# Patient Record
Sex: Male | Born: 1965 | Race: White | Hispanic: No | Marital: Married | Smoking: Never smoker
Health system: Southern US, Community
[De-identification: ages and names within clinical notes are randomized; demographics above are authoritative.]

## PROBLEM LIST (undated history)

## (undated) DIAGNOSIS — I509 Heart failure, unspecified: Secondary | ICD-10-CM

## (undated) DIAGNOSIS — D689 Coagulation defect, unspecified: Secondary | ICD-10-CM

## (undated) DIAGNOSIS — I10 Essential (primary) hypertension: Secondary | ICD-10-CM

## (undated) DIAGNOSIS — M109 Gout, unspecified: Secondary | ICD-10-CM

## (undated) DIAGNOSIS — N189 Chronic kidney disease, unspecified: Secondary | ICD-10-CM

## (undated) DIAGNOSIS — Z87442 Personal history of urinary calculi: Secondary | ICD-10-CM

## (undated) DIAGNOSIS — M1990 Unspecified osteoarthritis, unspecified site: Secondary | ICD-10-CM

## (undated) HISTORY — DX: Heart failure, unspecified: I50.9

## (undated) HISTORY — DX: Essential (primary) hypertension: I10

## (undated) HISTORY — PX: OTHER SURGICAL HISTORY: SHX169

## (undated) HISTORY — DX: Chronic kidney disease, unspecified: N18.9

## (undated) HISTORY — DX: Coagulation defect, unspecified: D68.9

## (undated) HISTORY — DX: Unspecified osteoarthritis, unspecified site: M19.90

---

## 2007-09-01 ENCOUNTER — Ambulatory Visit: Payer: Self-pay | Admitting: Family Medicine

## 2007-09-01 ENCOUNTER — Observation Stay (HOSPITAL_COMMUNITY): Admission: EM | Admit: 2007-09-01 | Discharge: 2007-09-03 | Payer: Self-pay | Admitting: Emergency Medicine

## 2009-10-17 ENCOUNTER — Encounter: Admission: RE | Admit: 2009-10-17 | Discharge: 2009-10-17 | Payer: Self-pay | Admitting: Internal Medicine

## 2010-05-23 ENCOUNTER — Ambulatory Visit (HOSPITAL_COMMUNITY)
Admission: RE | Admit: 2010-05-23 | Discharge: 2010-05-23 | Payer: Self-pay | Source: Home / Self Care | Attending: Orthopedic Surgery | Admitting: Orthopedic Surgery

## 2010-05-23 LAB — DIFFERENTIAL
Basophils Absolute: 0 10*3/uL (ref 0.0–0.1)
Basophils Relative: 0 % (ref 0–1)
Eosinophils Absolute: 0.1 10*3/uL (ref 0.0–0.7)
Eosinophils Relative: 1 % (ref 0–5)
Lymphocytes Relative: 35 % (ref 12–46)
Lymphs Abs: 2.8 10*3/uL (ref 0.7–4.0)
Monocytes Absolute: 0.6 10*3/uL (ref 0.1–1.0)
Monocytes Relative: 7 % (ref 3–12)
Neutro Abs: 4.4 10*3/uL (ref 1.7–7.7)
Neutrophils Relative %: 56 % (ref 43–77)

## 2010-05-23 LAB — CBC
HCT: 40.7 % (ref 39.0–52.0)
Hemoglobin: 13.2 g/dL (ref 13.0–17.0)
MCH: 28.9 pg (ref 26.0–34.0)
MCHC: 32.4 g/dL (ref 30.0–36.0)
MCV: 89.3 fL (ref 78.0–100.0)
Platelets: 202 10*3/uL (ref 150–400)
RBC: 4.56 MIL/uL (ref 4.22–5.81)
RDW: 13.4 % (ref 11.5–15.5)
WBC: 7.9 10*3/uL (ref 4.0–10.5)

## 2010-05-23 LAB — URINALYSIS, ROUTINE W REFLEX MICROSCOPIC
Bilirubin Urine: NEGATIVE
Hemoglobin, Urine: NEGATIVE
Ketones, ur: NEGATIVE mg/dL
Nitrite: NEGATIVE
Protein, ur: NEGATIVE mg/dL
Specific Gravity, Urine: 1.023 (ref 1.005–1.030)
Urine Glucose, Fasting: NEGATIVE mg/dL
Urobilinogen, UA: 0.2 mg/dL (ref 0.0–1.0)
pH: 5.5 (ref 5.0–8.0)

## 2010-05-23 LAB — BASIC METABOLIC PANEL
BUN: 16 mg/dL (ref 6–23)
CO2: 25 mEq/L (ref 19–32)
Calcium: 9.5 mg/dL (ref 8.4–10.5)
Chloride: 105 mEq/L (ref 96–112)
Creatinine, Ser: 0.9 mg/dL (ref 0.4–1.5)
GFR calc Af Amer: >60 mL/min (ref >60)
GFR calc non Af Amer: >60 mL/min (ref >60)
Glucose, Bld: 170 mg/dL — ABNORMAL HIGH (ref 70–99)
Potassium: 4.5 mEq/L (ref 3.5–5.1)
Sodium: 139 mEq/L (ref 135–145)

## 2010-05-23 LAB — PROTIME-INR
INR: 0.99 (ref 0.00–1.49)
Prothrombin Time: 13.3 seconds (ref 11.6–15.2)

## 2010-05-23 LAB — APTT: aPTT: 27 seconds (ref 24–37)

## 2010-06-09 ENCOUNTER — Encounter: Payer: Self-pay | Admitting: Internal Medicine

## 2010-06-10 ENCOUNTER — Inpatient Hospital Stay (HOSPITAL_COMMUNITY)
Admission: RE | Admit: 2010-06-10 | Discharge: 2010-06-12 | Payer: Self-pay | Source: Home / Self Care | Attending: Orthopedic Surgery | Admitting: Orthopedic Surgery

## 2010-06-11 LAB — GLUCOSE, CAPILLARY
Glucose-Capillary: 231 mg/dL — ABNORMAL HIGH (ref 70–99)
Glucose-Capillary: 208 mg/dL — ABNORMAL HIGH (ref 70–99)
Glucose-Capillary: 197 mg/dL — ABNORMAL HIGH (ref 70–99)
Glucose-Capillary: 221 mg/dL — ABNORMAL HIGH (ref 70–99)

## 2010-06-11 LAB — BASIC METABOLIC PANEL
BUN: 20 mg/dL (ref 6–23)
CO2: 24 mEq/L (ref 19–32)
Calcium: 9.4 mg/dL (ref 8.4–10.5)
Chloride: 103 mEq/L (ref 96–112)
Creatinine, Ser: 1.12 mg/dL (ref 0.4–1.5)
GFR calc Af Amer: >60 mL/min (ref >60)
GFR calc non Af Amer: >60 mL/min (ref >60)
Glucose, Bld: 232 mg/dL — ABNORMAL HIGH (ref 70–99)
Potassium: 4.5 mEq/L (ref 3.5–5.1)
Sodium: 139 mEq/L (ref 135–145)

## 2010-06-11 LAB — ABO/RH: ABO/RH(D): A POS

## 2010-06-11 LAB — TYPE AND SCREEN
ABO/RH(D): A POS
Antibody Screen: NEGATIVE

## 2010-06-12 LAB — URINALYSIS, ROUTINE W REFLEX MICROSCOPIC
Nitrite: NEGATIVE
Protein, ur: 30 mg/dL — AB

## 2010-06-12 LAB — GLUCOSE, CAPILLARY
Glucose-Capillary: 212 mg/dL — ABNORMAL HIGH (ref 70–99)
Glucose-Capillary: 255 mg/dL — ABNORMAL HIGH (ref 70–99)
Glucose-Capillary: 256 mg/dL — ABNORMAL HIGH (ref 70–99)
Glucose-Capillary: 233 mg/dL — ABNORMAL HIGH (ref 70–99)

## 2010-06-12 LAB — BASIC METABOLIC PANEL
BUN: 28 mg/dL — ABNORMAL HIGH (ref 6–23)
CO2: 26 mEq/L (ref 19–32)
Calcium: 8.6 mg/dL (ref 8.4–10.5)
Chloride: 100 mEq/L (ref 96–112)
Creatinine, Ser: 0.94 mg/dL (ref 0.4–1.5)
GFR calc Af Amer: >60 mL/min (ref >60)
Glucose, Bld: 195 mg/dL — ABNORMAL HIGH (ref 70–99)
Glucose, Bld: 209 mg/dL — ABNORMAL HIGH (ref 70–99)
Potassium: 4.4 mEq/L (ref 3.5–5.1)
Sodium: 136 mEq/L (ref 135–145)

## 2010-06-12 LAB — CBC
HCT: 31.3 % — ABNORMAL LOW (ref 39.0–52.0)
Hemoglobin: 11 g/dL — ABNORMAL LOW (ref 13.0–17.0)
MCH: 28.9 pg (ref 26.0–34.0)
MCHC: 33 g/dL (ref 30.0–36.0)
MCV: 88.4 fL (ref 78.0–100.0)
Platelets: 173 10*3/uL (ref 150–400)
RBC: 3.54 MIL/uL — ABNORMAL LOW (ref 4.22–5.81)
WBC: 12.3 10*3/uL — ABNORMAL HIGH (ref 4.0–10.5)

## 2010-06-13 LAB — SURGICAL PCR SCREEN: MRSA, PCR: NEGATIVE

## 2010-06-17 NOTE — Op Note (Signed)
NAMEAULTON, ROUTT NO.:  [id number]  MEDICAL RECORD NO.:  [id number]          PATIENT TYPE:  INP  LOCATION:  0010                         FACILITY:  [facility]  PHYSICIAN:  Madlyn Frankel. Charlann Boxer, M.D.  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  PROCEDURE PERFORMED:  Right total hip replacement utilizing DePuy component size 54 Pinnacle cup, single cancellous screw, 36 +4 neutral AltrX liner, size 5 high Tri-Lock stem, with 36 +1.5 Delta ceramic ball.  SURGEON:  Madlyn Frankel. Charlann Boxer, MD  ASSISTANT:  Nelia Shi. Webb Silversmith, Nurse Practitioner  ANESTHESIA:  General endotracheal.  SPECIMENS:  None.  DRAINS:  One Hemovac.  BLOOD LOSS:  Approximately 600 mL.  INDICATIONS FOR PROCEDURE:  Mr. Tavis is a 45 year old male who presented to the office for evaluation of right hip pain with significant loss of motion and function and limp related to his advanced right hip degenerative changes noted radiographically.  He had proximal femoral deformity from history of old trauma versus slipped capital femoral epiphysis, history unclear.  After reviewing the risks of infection, DVT, component failure, dislocation, the postop course expectations, his increased risk related to his diabetes, as well as a discussion about receiving tranexamic acid, the patient was consented for benefit of pain relief.  PROCEDURE IN DETAIL:  The patient was brought to the operative theater. Once adequate anesthesia, preoperative antibiotics, Ancef administered  2 g, he was positioned to the left lateral decubitus position with the right side up.  The right lower extremity was then prepped and draped in a sterile fashion.  A time-out was performed identifying the patient, planned procedure and extremity.  The patient was noted to have a lot of small little wounds along his buttock in the cheek area.  This was draped out with [place].  Incision was identified and marked out to avoid these areas.  Time-out was performed identifying the patient, planned procedure and extremity.  A lateral based incision was made based off his proximal trochanter which had migrated up.  Sharp dissection was carried to the iliotibial band and gluteal fascia.  These were incised posteriorly. The patient was noted to have a 10-15 degree external rotation contracture, so soft tissue dissection posteriorly was carried out carefully.  Once I had released some of the capsule into the neck area, I was able to rotate the hip enough to get it dislocated.  Once I had debrided and cleared out the proximal femur area around the neck-shaft junction, I was able to make an osteotomy in the trochanteric fossa.  Attention was first directed to the femur.  Femoral hook was prepared by opening the proximal femur with a drill, hand reamed once and then irrigated to try to prevent fat emboli.  I began broaching with size  1 broach and broached up initially to a size 4 broach.  I  re-angled my neck cut a bit to better fit the prosthesis.  The femur was then packed with a sponge, and attention was now directed to the acetabulum.  Acetabular retractors were placed and significant deformity to the acetabulum was then noted and appreciated.  Once the acetabulum was exposed, I began reaming with a 48 reamer, reamed up to 53 reamer with good bony preparation.  A 54 pinnacle cup was then impacted with a curved impactor using the cup guide.  It appeared to be at 35-40 degrees of abduction and  was forward flexed enough to bring it to the anterior rim anteriorly with the portion of cup exposed superolaterally.  At this point, a single cancellous screw was placed.  The 36 +4 neutral AltrX liner was then impacted into position.  Attention was now directed to trial reduction.  The 4 broach was impacted.  The trial reduction was carried out.  The broach sat at the level of the neck cut at this point.  The trial indicated a stable hip without evidence of impingement or subluxation throughout the range of motion, far more than he had preoperatively.  At this point, the hip was dislocated.  When I checked for torsion on the 4 broach, there was a little bit of movement, so I broached up to a size 5 and then chose a 5 high Tri-Lock stem.  The final 5 high Tri-Lock stem was impacted.  It sat about a millimeter proud of the neck cut which was acceptable.  I re-trialled and chose a 36 +1.5 Delta ceramic ball.  It was then impacted onto a clean and dry trunnion and the hip reduced.  We did irrigate the hip throughout the case and again at this point.  Based on the soft tissue deformity and contractures, there was no capsule to be repaired.  I thus placed a medium Hemovac drain deep, reapproximated the iliotibial band and gluteal fascia over this Hemovac drain.  The remaining wound was closed with 2-0 Vicryl and running 4-0 Monocryl.  The hip was cleaned, dried, dressed sterilely with Dermabond and Aquacel dressing.  He was then brought to the recovery room, extubated in stable condition.  He tolerated the procedure well.     Madlyn Frankel Charlann Boxer, M.D.     MDO/MEDQ  D:  06/10/2010  T:  06/10/2010  Job:  [id number]  Electronically Signed by Durene Romans M.D. on 06/17/2010 07:05:08 AM

## 2010-06-19 HISTORY — PX: JOINT REPLACEMENT: SHX530

## 2010-06-30 NOTE — Discharge Summary (Signed)
  NAMESENAY, SISTRUNK NO.:  [id number]  MEDICAL RECORD NO.:  [id number]          PATIENT TYPE:  INP  LOCATION:  1611                         FACILITY:  [facility]  PHYSICIAN:  Madlyn Frankel. Charlann Boxer, M.D.  DATE OF BIRTH:  11-16-1965  DATE OF ADMISSION:  06/10/2010 DATE OF DISCHARGE:                              DISCHARGE SUMMARY   ADMITTING DIAGNOSIS:  Right hip osteoarthritis, femoral head collapse.  BRIEF HISTORY:  This patient was seen in June in our office and evaluated for the right hip pain.  He has decided, after some deliberation and at the request of his primary care doctor, Dr. Merla Riches, that he be reevaluated.  He has come in with significant complaints of the right hip pain.  Diagnostically and radiographically it is worse and he has decided to proceed with right hip arthroplasty.  HOSPITAL COURSE:  The patient was admitted through same-day surgery on January 23 and underwent the right hip arthroplasty without any difficulties.  He was taken to the operating theater and then the PACU for further recovery and rehabilitation.  He was brought to [facility] for rehabilitation and since that time he has advanced his diet to regular. He has been up with physical therapy and done well with that.  CONDITION ON DISCHARGE:  The patient's discharge condition is good.  DISCHARGE INSTRUCTIONS:  His discharge instructions were given.  He knows that he can shower.  He has an Aquacel dressing in place.  That will need to come off in about 7 days and he understands that he will follow up in our office in 2 weeks.  PAST MEDICAL HISTORY:  His past medical history is significant for Perthes' disease, sleep apnea, hypertension, high cholesterol, diabetes, urinary frequency at night and gout, shortness of breath with lying flat as well as the osteoarthritis.  Again, discharge condition is good.  DISCHARGE MEDICATIONS: 1. Acetaminophen 325 mg every 4 hours as needed. 2. Colace  100 mg twice daily as needed. 3. Ferrous sulfate 325 three times a day for 3 weeks. 4. Hydrocodone 7.5/325 one to two q.4-6 hours p.r.n. pain. 5. Robaxin 500 mg every 6 hours as needed. 6. MiraLax 17 grams a day as needed. 7. Xarelto 10 mg a day for 10 days. 8. Fish oil 450 a day. 9. Ibuprofen 200 mg every 6 hours as needed. 10.Lisinopril 10 mg a day. 11.Metformin 1000 mg every morning. 12.Pravastatin 40 mg a day. 13.Testosterone 30 mg daily. 14.Vitamin B and D three daily.     Russell L. Webb Silversmith, RN   ______________________________ Madlyn Frankel Charlann Boxer, M.D.    RLW/MEDQ  D:  06/12/2010  T:  06/12/2010  Job:  [id number]  Electronically Signed by Lauree Chandler NP-C on 06/26/2010 09:41:57 AM Electronically Signed by Durene Romans M.D. on 06/30/2010 09:16:29 AM

## 2010-08-12 NOTE — H&P (Signed)
NAMEFRANKY, [name] Snow NO.:  [id number]  MEDICAL RECORD NO.:  [id number]          PATIENT TYPE:  INP  LOCATION:  1611                         FACILITY:  [facility]  PHYSICIAN:  Madlyn Frankel. Charlann Boxer, M.D.  DATE OF BIRTH:  1966-01-10  DATE OF ADMISSION:  06/10/2010 DATE OF DISCHARGE:  06/12/2010                             HISTORY & PHYSICAL   ADMISSION DIAGNOSES: 1. Right hip advanced degenerative joint changes with underlying     previous slipped capital femoral epiphysis versus Perthes disease. 2. History of sleep apnea. 3. Hypertension.  ADMITTING HISTORY:  [name] Snow is a pleasant 45 year old male who had presented to the office for evaluation of right hip pain.  He had advanced right hip degenerative changes noted with underlying condition consistent with either an old slipped capital femoral epiphysis versus Perthes.  He did not recall his history.  After reviewing with him his current situation, his failure to respond to conservative measures, persistent pain, we discussed hip replacement as an option.  Risks and benefits were discussed.  Consent was obtained for the above.  PAST MEDICAL HISTORY: 1. Perthes disease. 2. Sleep apnea. 3. Hypertension. 4. Hypercholesterolemia. 5. Diabetes. 6. Urinary frequency. 7. Shortness of breath on lying flat.  HOME MEDICATIONS: 1. Tylenol as needed. 2. Testosterone 30 mg daily. 3. Pravastatin 40 mg q.a.m. 4. Metformin 1000 mg every morning. 5. Lisinopril 10 mg daily. 6. Ibuprofen as needed. 7. Fish oil daily. 8. Vitamin B and D 3 times a day.  DRUG ALLERGIES:  No known drug allergies.  SOCIAL HISTORY:  He denies any significant smoking or current alcohol use.  He wished to remain as active and possible as he was from working standpoint.  REVIEW OF SYSTEMS:  At that time revealed that he had no recent hospitalizations for headaches, dizziness, blurred vision.  He had no complication related to his diabetes.   He had no rhinitis, productive cough, hemoptysis, no chest pains, no abdominal pains or discomfort with nausea, vomiting, constipation, diarrhea.  No burning or frequency of urination.  His frequency was only noted to be nocturia.  He had no numbness and tingling in his lower extremities.  His hip pain was his predominant complaint.  PHYSICAL EXAMINATION:  GENERAL/VITAL SIGNS:  He was awake, alert, and oriented with stable vital signs.  He was cooperative with normal respirations, normal vision and no slurred speech. CHEST:  Clear to auscultation bilaterally. HEART:  Regular rate and rhythm. ABDOMEN:  Nonobese and nontender. EXTREMITIES:  Examination of his right hip revealed that he had an external rotation, contracture of the right hip with pain with hip flexion and internal rotation.  Left hip otherwise moved fluidly.  He was intact neurovascularly distally without evidence of any lymphatic or venous changes.  He had intact sensibility to light touch.  DIAGNOSTIC STUDIES:  Labs, EKG, and chest x-ray [facility]  His preoperative hip x-rays were done through our office.  ASSESSMENT:  Advanced right hip degenerative changes with underlying preexisting condition.  PLAN:  [name] Snow will be admitted for same-day surgery on June 10, 2010, and undergo a right  total hip replacement.  He will be placed on Xarelto postoperatively for DVT prophylaxis as well as with early activity.  Consent will be obtained based on our discussion today.     Madlyn Frankel Charlann Boxer, M.D.     MDO/MEDQ  D:  08/11/2010  T:  08/12/2010  Job:  [id number]  Electronically Signed by Durene Romans M.D. on 08/12/2010 10:34:08 AM

## 2010-10-04 NOTE — H&P (Signed)
NAMESUSANA, [name] NO.:  Snow   MEDICAL RECORD NO.:  [id number]          PATIENT TYPE:  OBV   LOCATION:  4707                         FACILITY:  [facility]   PHYSICIAN:  [name] Boozer, MD      DATE OF BIRTH:  12-26-1965   DATE OF ADMISSION:  09/01/2007  DATE OF DISCHARGE:  09/03/2007                              HISTORY & PHYSICAL   PRIMARY CARE PHYSICIAN:  [name] Snow, M.D.   CHIEF COMPLAINT:  Chest pain.   HISTORY OF PRESENT ILLNESS:  [name] is a 45 year old morbidly obese  white male who presents with chest heaviness in the sternal region for  the past 4 days.  He has also had bilateral shoulder achiness since the  chest pain started.  It does get worse with exertion.  He has also had  an increased heartburn for the last several days.  The pain does not  radiate but is associated with occasional nausea and sweatiness.  The  pain is dull and heavy in character.  It does feel better with pressing  on the area of pain.   PAST MEDICAL HISTORY:  1. Hypertension.  2. Chronic pain.  3. Severe gout.  4. Arthritis.  5. Morbid obesity.  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  7. Hip pain.  8. Recent history of multiple UTIs.  9. Obstructive sleep apnea.   PAST SURGICAL HISTORY:  None.   CURRENT MEDICATIONS:  1. Vicodin 10/650 q.i.d. p.r.n.  2. Cipro for per UTI versus prostatitis since August 19, 2007.  3. Lisinopril/HCTZ 20/12.5 p.o. daily.   ALLERGIES:  No known drug allergies.   SOCIAL HISTORY:  The patient has a 50-year-old son and wife at home.  He  is on disability currently for disability with Legg-Calve-Perthes  disease.  He denies tobacco or alcohol or drug use.  He recently won a  disability suit.   FAMILY HISTORY:  On his mother's side, there is heart disease and CVA.  On his father's side, there is alcohol and drug abuse.  His sister is  obese but otherwise healthy.  His brother has back and spine problems  and alcohol abuse.   REVIEW OF SYSTEMS:  Reveals occasional hematuria, decreased visual  acuity, nausea and sweating with chest pain.  He snores heavily and has  multiple-pillow orthopnea.  He has had total 20-pound weight loss this  year.  He also has hemoptysis with streaks if he gets gagged.  Otherwise, his review of systems is negative on greater than 10 systems.   PHYSICAL EXAMINATION:  VITAL SIGNS:  Temperature 98.7, respiratory rate  14-16, pulse 75-76, blood pressure 124-150/78-100, O2 sat 99% on 2 L and  97% on room air.  GENERAL:  He is an obese white male, in no apparent distress.  He is  lying in bed.  HEENT:  He is normocephalic, atraumatic with extraocular muscles intact.  Pupils equal, round and reactive to light.  Sclerae are clear.  Nares  are without discharge.  Oropharynx is pink and moist.  There is fair  dentition.  CARDIOVASCULAR:  Regular  rate and rhythm with no murmurs, rubs, or  gallops but the heart sounds are very distant, given the patient's  habitus.  There are no carotid bruits appreciated.  There is no  appreciable JVD, but it is difficult to observe, given the patient's  habitus.  PULMONARY:  He is clear to auscultation bilaterally with normal work of  breathing.  ABDOMEN:  Obese, nondistended, mildly tender in the right upper  quadrant.  There is no hepatosplenomegaly appreciated.  It is soft.  EXTREMITIES:  No cyanosis, clubbing, or edema.  NEURO:  He is alert and oriented x3 with cranial nerves II through XII  grossly intact.  Strength is 5/5 in all extremities.   LABORATORY DATA:  Point of care cardiac enzymes reveal CK-MB 1.2,  troponin I less than 0.05, myoglobin 106.  ISTAT reveals immunoglobulin  14.6, hematocrit 43, ionized-calcium 1.09, sodium 138, potassium 4.5,  chloride 105, glucose 119, BUN 22, creatinine 1.2, D-dimer is 1.2.  CBC  with white blood cell count 9.9, hemoglobin 13.9, hematocrit 41.3, and  platelet count 211 with a normal  differential.  Chest x-ray reveals no  acute thoracic findings.  CT angiogram reveals right lower lobe nodules  small x2, with a recommended followup in 3 months if high risk, or 6  months if low risk.  There is no signs of PE.  EKG reveals normal sinus  rhythm with no acute abnormalities.  In the ED, the patient was given  nitroglycerin x2, aspirin 324 mg, and morphine 4 mg IV.   ASSESSMENT AND PLAN:  [name] Snow is 45 year old with chest pain and  hypertension.  1. Chest pain.  It is improved in the ED.  We will cycle his cardiac      enzymes and we will check an EKG in the a.m.  We will risk stratify      him and check a fasting lipid panel in the morning as well as a      TSH.  We will continue aspirin at 81 mg p.o. daily, and we will      start a low-dose beta-blocker.  Chest x-ray is reassuring with no      pulmonary disease.  EKG was also reassuring.  We will start      Protonix 40 mg p.o. daily as well, given his recent NSAID use and a      question of blood-tinged sputum per his ED note.  We will also      check a TSH as noted above to help stratify him.  We will provide      oxygen as needed.  We will provide morphine as needed.  We will not      provide treatment dose of Lovenox because his story does not seem      like unstable angina at this point.  2. Obstructive sleep apnea.  The patient will not wear CPAP and he      refused it.  3. Hypertension.  We will monitor.  If elevated, we will start an ACE      inhibitor.  We will avoid diuretics, given his gout.  4. Gout.  The patient has tolerated allopurinol and colchicine in the      past.  There is no current flare.  Continue his Vicodin per his      home regimen.  There is no diuretic recommended in the future.  5. Arthritis.  Continue his Vicodin.  6. Prophylaxis.  Lovenox and now Protonix.  7.  History of urinary tract infection.  He could not take his full      course of the Cipro.  Therefore, we will check an urinalysis  and      urine culture.  8. Lung nodules.  The patient will need repeat CT in 6 months, because      he is low risk.  9. Fluids, electrolytes, nutrition.  GI will help to place a      peripheral IV.  We will check a CMET and will provide a low-sodium      heart-healthy diet.   DISPOSITION:  Likely, he will be home tomorrow if he rules out  overnight.      [name] Boozer, MD  Electronically Signed     SA/MEDQ  D:  09/05/2007  T:  09/06/2007  Job:  [id number]

## 2010-10-04 NOTE — Discharge Summary (Signed)
NAMEIVER, MIKLAS NO.:  [id number]   MEDICAL RECORD NO.:  [id number]          PATIENT TYPE:  OBV   LOCATION:  4707                         FACILITY:  [facility]   PHYSICIAN:  Ancil Boozer, MD      DATE OF BIRTH:  [birth year]   DATE OF ADMISSION:  09/01/2007  DATE OF DISCHARGE:  09/03/2007                               DISCHARGE SUMMARY   PRIMARY CARE PHYSICIAN:  Dr. Merla Riches at [facility].   REASON FOR ADMISSION:  Chest pain.   DISCHARGE DIAGNOSES:  1. Hypertension.  2. Obstructive sleep apnea.  3. Hyperlipidemia.  4. Severe gout.  5. Lung nodules.   DISCHARGE MEDICATIONS:  1. Metoprolol 12.5 mg p.o. b.i.d.  2. Niacin 250 mg p.o. daily.  3. Vicodin 10/650 mg 4 times daily p.r.n.  4. Omeprazole OTC 20 mg p.o. b.i.d.   CONSULTANT:  None.   PROCEDURE AND STUDIES:  The patient had a chest x-ray on September 01, 2007,  which revealed no acute thoracic findings, and a CT angiogram on September 01, 2007, which revealed 2 tiny right lower lobe pulmonary nodules and  in the patient with no smoking history or malignancy history, a followup  chest CT without contrast in 6-12 months can be used to assess debility  in a high risk patient.  Followup should be done in 3 months.  There was  no evidence for pulmonary embolus.  Abdominal ultrasound on September 02, 2007, revealed increased echogenicity of the liver that most likely  reflected fatty infiltration and no other acute process seen, normal  appearance of the gallbladder.   LABORATORY DATA:  On admission, the patient's CBC revealed white blood  cell count 9.9, hemoglobin 13.9, hematocrit 41.3, and platelet count 211  with a normal differential.  D-dimer revealed was 1.2.  Point-of-care  cardiac markers were negative.  Comprehensive metabolic panel with  sodium 138, potassium 4.6, chloride 102, bicarb 27, glucose 123, BUN 17,  creatinine 1.03, total bilirubin 0.9, alkaline phosphatase 69, AST 19,  ALT 18, total  protein 7.5, albumin 3.6, and calcium 8.8.  Cardiac  enzymes were attended x3, were negative x3.  Urinalysis revealed  specific gravity of 1.039, pH 5.0, and otherwise was negative.  Lipid  profile revealed cholesterol of 219, triglycerides 497, HDL 24, and I  was unable to calculate LDL given high triglycerides.  TSH was 1.905,  and indirect LDL was 127.  Urine culture revealed 6000 colonies of  insignificant growth.   HOSPITAL COURSE:  Mr. Duris is a 45 year old gentleman who initially  presented with substernal chest pain.  He was admitted for cardiac rule  out.  His cardiac enzymes remained negative and it was felt that the  patient was likely suffering from perhaps GERD and this was the cause  for his abdominal pain.  He was started on Protonix daily, which seemed  to help his pain at least somewhat.  I was also concerned that perhaps  the patient was having right upper quadrant pain that was related to  gallstones, therefore an abdominal ultrasound was obtained as noted  above.  After workup it was not noted that the patient had any gallstone  disease.  Throughout his hospital course, his blood pressure was well  controlled with a small dose of metoprolol that he was sent home on.  He  was found to be with hyperlipidemia and thus he was started at discharge  on niacin at a regular dose.  He also will benefit from statin as well,  increasing his niacin to achieve good triglyceride goals.  He did have  lung nodules as noted above.  He will need follow up with a CT in 6-12  months as noted above.   Instructions to follow up, the patient has an appointment scheduled with  his primary care physician, Dr. Merla Riches on September 08, 2007, at 4 p.m.  He is to follow a low-sodium or healthy diet and has no restrictions  with regard to his activity.  Again he will likely need his niacin  titrated up and will need to be started on a statin in the future.  He  will also need followup of the lung  nodules with CT in 6-12 months.      Ancil Boozer, MD  Electronically Signed     SA/MEDQ  D:  09/05/2007  T:  09/06/2007  Job:  [id number]

## 2011-02-11 LAB — BASIC METABOLIC PANEL
CO2: 28
Calcium: 8.8
Chloride: 104
GFR calc Af Amer: >60
Glucose, Bld: 114 — ABNORMAL HIGH
Potassium: 4.2
Sodium: 141

## 2011-02-11 LAB — POCT CARDIAC MARKERS: CKMB, poc: 1.2

## 2011-02-11 LAB — POCT I-STAT, CHEM 8
BUN: 22
Calcium, Ion: 1.09 — ABNORMAL LOW
Chloride: 105
Creatinine, Ser: 1.2
Glucose, Bld: 119 — ABNORMAL HIGH
Potassium: 4.5

## 2011-02-11 LAB — CARDIAC PANEL(CRET KIN+CKTOT+MB+TROPI)
CK, MB: 0.9
Relative Index: INVALID
Relative Index: INVALID
Total CK: 49
Total CK: 53

## 2011-02-11 LAB — CBC
HCT: 41.3
Hemoglobin: 12 — ABNORMAL LOW
MCHC: 33.7
MCHC: 35
Platelets: 211
RBC: 4.03 — ABNORMAL LOW
RDW: 13.9

## 2011-02-11 LAB — DIFFERENTIAL
Basophils Absolute: 0.1
Basophils Relative: 1
Eosinophils Absolute: 0.2
Eosinophils Relative: 2
Lymphocytes Relative: 29
Monocytes Absolute: 0.7

## 2011-02-11 LAB — COMPREHENSIVE METABOLIC PANEL
Alkaline Phosphatase: 69
CO2: 27
Calcium: 8.8
Chloride: 102
GFR calc Af Amer: >60
Sodium: 138
Total Protein: 7.5

## 2011-02-11 LAB — LIPID PANEL
Cholesterol: 219 — ABNORMAL HIGH
HDL: 24 — ABNORMAL LOW
LDL Cholesterol: UNDETERMINED
Total CHOL/HDL Ratio: 9.1

## 2011-02-11 LAB — URINALYSIS, ROUTINE W REFLEX MICROSCOPIC
Ketones, ur: NEGATIVE
Nitrite: NEGATIVE
pH: 5

## 2011-02-11 LAB — D-DIMER, QUANTITATIVE: D-Dimer, Quant: 1.2 — ABNORMAL HIGH

## 2011-02-11 LAB — TROPONIN I: Troponin I: 0.02

## 2011-02-11 LAB — CK TOTAL AND CKMB (NOT AT ARMC): CK, MB: 0.9

## 2011-02-11 LAB — LDL CHOLESTEROL, DIRECT: Direct LDL: 127 — ABNORMAL HIGH

## 2011-02-11 LAB — TSH: TSH: 1.905

## 2011-04-25 ENCOUNTER — Ambulatory Visit (INDEPENDENT_AMBULATORY_CARE_PROVIDER_SITE_OTHER): Payer: MEDICARE

## 2011-04-25 DIAGNOSIS — M109 Gout, unspecified: Secondary | ICD-10-CM

## 2011-04-25 DIAGNOSIS — E1065 Type 1 diabetes mellitus with hyperglycemia: Secondary | ICD-10-CM

## 2011-04-25 DIAGNOSIS — E1049 Type 1 diabetes mellitus with other diabetic neurological complication: Secondary | ICD-10-CM

## 2011-04-25 DIAGNOSIS — IMO0001 Reserved for inherently not codable concepts without codable children: Secondary | ICD-10-CM

## 2011-04-30 ENCOUNTER — Encounter: Payer: Self-pay | Admitting: Internal Medicine

## 2011-04-30 ENCOUNTER — Ambulatory Visit: Payer: Self-pay | Admitting: Internal Medicine

## 2011-04-30 DIAGNOSIS — M9110 Juvenile osteochondrosis of head of femur [Legg-Calve-Perthes], unspecified leg: Secondary | ICD-10-CM

## 2011-04-30 DIAGNOSIS — Z8739 Personal history of other diseases of the musculoskeletal system and connective tissue: Secondary | ICD-10-CM | POA: Insufficient documentation

## 2011-04-30 DIAGNOSIS — E1140 Type 2 diabetes mellitus with diabetic neuropathy, unspecified: Secondary | ICD-10-CM | POA: Insufficient documentation

## 2011-04-30 DIAGNOSIS — O99215 Obesity complicating the puerperium: Secondary | ICD-10-CM

## 2011-04-30 DIAGNOSIS — IMO0002 Reserved for concepts with insufficient information to code with codable children: Secondary | ICD-10-CM | POA: Insufficient documentation

## 2011-04-30 DIAGNOSIS — M792 Neuralgia and neuritis, unspecified: Secondary | ICD-10-CM

## 2011-04-30 DIAGNOSIS — M109 Gout, unspecified: Secondary | ICD-10-CM | POA: Insufficient documentation

## 2011-06-20 ENCOUNTER — Telehealth: Payer: Self-pay | Admitting: Internal Medicine

## 2011-06-20 ENCOUNTER — Ambulatory Visit (INDEPENDENT_AMBULATORY_CARE_PROVIDER_SITE_OTHER): Payer: MEDICARE | Admitting: Internal Medicine

## 2011-06-20 VITALS — BP 140/80 | HR 90 | Temp 99.0°F | Resp 18

## 2011-06-20 DIAGNOSIS — M9190 Juvenile osteochondrosis of hip and pelvis, unspecified, unspecified leg: Secondary | ICD-10-CM

## 2011-06-20 DIAGNOSIS — M792 Neuralgia and neuritis, unspecified: Secondary | ICD-10-CM

## 2011-06-20 DIAGNOSIS — M109 Gout, unspecified: Secondary | ICD-10-CM

## 2011-06-20 DIAGNOSIS — IMO0002 Reserved for concepts with insufficient information to code with codable children: Secondary | ICD-10-CM

## 2011-06-20 DIAGNOSIS — M9110 Juvenile osteochondrosis of head of femur [Legg-Calve-Perthes], unspecified leg: Secondary | ICD-10-CM

## 2011-06-20 DIAGNOSIS — E119 Type 2 diabetes mellitus without complications: Secondary | ICD-10-CM

## 2011-06-20 MED ORDER — COLCHICINE 0.6 MG PO TABS: 0.6000 mg | ORAL_TABLET | Freq: Two times a day (BID) | ORAL | Status: DC

## 2011-06-20 MED ORDER — TRIAMCINOLONE ACETONIDE 40 MG/ML IJ SUSP: 40.0000 mg | Freq: Once | INTRAMUSCULAR | Status: DC

## 2011-06-20 MED ORDER — HYDROCODONE-ACETAMINOPHEN 10-325 MG PO TABS: 1.0000 | ORAL_TABLET | Freq: Four times a day (QID) | ORAL | Status: DC | PRN

## 2011-06-20 MED ORDER — TRIAMCINOLONE ACETONIDE 40 MG/ML IJ SUSP
80.0000 mg | Freq: Once | INTRAMUSCULAR | Status: AC
  Administered 2011-06-20: 80 mg via INTRAMUSCULAR

## 2011-06-20 MED ORDER — DEXAMETHASONE 4 MG PO TABS: ORAL_TABLET | ORAL | Status: DC

## 2011-06-20 NOTE — Progress Notes (Signed)
This is an urgent care visit for [name] Snow who has had significant gout since mid December. It is now involving several several joints including the top of his feet the left and right knee and one elbow. He is in severe pain and is out of his pain medication. He has not been taking his medicines for diabetes or hypertension over the past week because he feels so bad. Noticed that he is currently in transition from metformin to Glucotrol and a Januvia was to be added in December. He has not filled the prescription yet due to cost. His hypertension has been controlled at home according to him. He has not checked any blood sugars. He has continued to have problems with pain in his testicles since his trip to the urologist. No diagnosis was arrived at and yet he still has significant discomfort at times. He admits to a diet that is very high and half fructose corn syrup. In fructose corn syrup. He continues to exhibit morbid obesity  Review of systems he denies chills and fever. There has been no vision change. He has no upper or lower sarcoid complaints or cardiovascular complaints. He does not currently have heartburn nausea or vomiting. Although he has testicular pain he denies frequency urgency nocturia or dysuria and has no signs of obstruction to flow. Although his gout is active with swelling he does not have redness. There is no associated rash. He has no headache.  Physical examination-he has extreme pain to palpation in both feet both knees his left elbow and his right and left wrists. There is no swelling except for the left knee and the left foot. His hands are puffy. There is no associated redness or cellulitis.  Problem #1 gout-he will be treated with IM Kenalog and by mouth Decadron with colchicine. He will continue the colchicine for one month if he is stable at twice a day dose. He will return immediately if this does not work.  Problem #2 uncontrolled diabetes-he is advised to restart his  medications at once and to watch for hyperglycemia secondary to steroids. He knows how to address this with his diet and with a possible increase in his medicine. Starting insulin was discussed today although he was advised to try Januvia to see if it would work for him. His neuropathy will need evaluation of a later date.  Problem #3 hypertension he is to restart his same medicine   Name: [name] Snow    MRN: [id number] Date: 06/21/2011     DOB: 03/20/1966

## 2011-06-20 NOTE — Assessment & Plan Note (Signed)
He now has burning in his hands and feet both. This has not been clearly evaluated. He is thought to have a diabetic neuropathy for his long history of lack of control and lack of compliance.  This will be further evaluated when his other problems are more stable.

## 2011-06-20 NOTE — Telephone Encounter (Signed)
TEST!!!

## 2011-06-21 NOTE — Assessment & Plan Note (Signed)
Off meds one week

## 2011-06-25 NOTE — Telephone Encounter (Signed)
This was a test

## 2011-10-01 ENCOUNTER — Other Ambulatory Visit: Payer: Self-pay

## 2011-10-01 MED ORDER — COLCHICINE 0.6 MG PO TABS: 0.6000 mg | ORAL_TABLET | Freq: Two times a day (BID) | ORAL | Status: DC

## 2011-10-01 NOTE — Telephone Encounter (Signed)
Rx faxed into pharmacy, called pt, no answer.

## 2011-10-01 NOTE — Telephone Encounter (Signed)
Colchicine 0.6mg  BID, last ordered 06/20/11 with no refills

## 2011-10-01 NOTE — Telephone Encounter (Signed)
Done

## 2011-10-01 NOTE — Telephone Encounter (Signed)
Pt would like a refill on calchicine.  Pharmacy: [facility]

## 2011-10-19 ENCOUNTER — Ambulatory Visit (INDEPENDENT_AMBULATORY_CARE_PROVIDER_SITE_OTHER): Payer: MEDICARE | Admitting: Internal Medicine

## 2011-10-19 VITALS — BP 154/89 | HR 61 | Temp 98.3°F | Resp 18 | Ht 70.0 in | Wt 294.0 lb

## 2011-10-19 DIAGNOSIS — M25559 Pain in unspecified hip: Secondary | ICD-10-CM

## 2011-10-19 DIAGNOSIS — G8929 Other chronic pain: Secondary | ICD-10-CM | POA: Insufficient documentation

## 2011-10-19 DIAGNOSIS — G5600 Carpal tunnel syndrome, unspecified upper limb: Secondary | ICD-10-CM | POA: Insufficient documentation

## 2011-10-19 DIAGNOSIS — E1140 Type 2 diabetes mellitus with diabetic neuropathy, unspecified: Secondary | ICD-10-CM | POA: Insufficient documentation

## 2011-10-19 DIAGNOSIS — E119 Type 2 diabetes mellitus without complications: Secondary | ICD-10-CM

## 2011-10-19 DIAGNOSIS — N200 Calculus of kidney: Secondary | ICD-10-CM | POA: Insufficient documentation

## 2011-10-19 DIAGNOSIS — IMO0001 Reserved for inherently not codable concepts without codable children: Secondary | ICD-10-CM

## 2011-10-19 DIAGNOSIS — B351 Tinea unguium: Secondary | ICD-10-CM | POA: Insufficient documentation

## 2011-10-19 DIAGNOSIS — M109 Gout, unspecified: Secondary | ICD-10-CM

## 2011-10-19 DIAGNOSIS — M9110 Juvenile osteochondrosis of head of femur [Legg-Calve-Perthes], unspecified leg: Secondary | ICD-10-CM

## 2011-10-19 DIAGNOSIS — E1149 Type 2 diabetes mellitus with other diabetic neurological complication: Secondary | ICD-10-CM

## 2011-10-19 DIAGNOSIS — M1A9XX1 Chronic gout, unspecified, with tophus (tophi): Secondary | ICD-10-CM

## 2011-10-19 LAB — POCT CBC
Granulocyte percent: 61.6 %G (ref 37–80)
HCT, POC: 42.8 % — AB (ref 43.5–53.7)
MCH, POC: 27.6 pg (ref 27–31.2)
MCV: 88.7 fL (ref 80–97)
POC LYMPH PERCENT: 31.5 %L (ref 10–50)
RBC: 4.82 M/uL (ref 4.69–6.13)
WBC: 11.7 10*3/uL — AB (ref 4.6–10.2)

## 2011-10-19 LAB — COMPREHENSIVE METABOLIC PANEL
ALT: 13 U/L (ref 0–53)
Albumin: 4 g/dL (ref 3.5–5.2)
CO2: 23 mEq/L (ref 19–32)
Calcium: 9.7 mg/dL (ref 8.4–10.5)
Chloride: 104 mEq/L (ref 96–112)
Glucose, Bld: 122 mg/dL — ABNORMAL HIGH (ref 70–99)
Sodium: 137 mEq/L (ref 135–145)
Total Protein: 7.8 g/dL (ref 6.0–8.3)

## 2011-10-19 LAB — POCT GLYCOSYLATED HEMOGLOBIN (HGB A1C): Hemoglobin A1C: 6.8

## 2011-10-19 LAB — POCT SEDIMENTATION RATE: POCT SED RATE: 60 mm/hr — AB (ref 0–22)

## 2011-10-19 LAB — LIPID PANEL
Cholesterol: 219 mg/dL — ABNORMAL HIGH (ref 0–200)
VLDL: 58 mg/dL — ABNORMAL HIGH (ref 0–40)

## 2011-10-19 MED ORDER — LISINOPRIL 10 MG PO TABS: 10.0000 mg | ORAL_TABLET | Freq: Every day | ORAL | Status: DC

## 2011-10-19 MED ORDER — COLCHICINE 0.6 MG PO TABS: 0.6000 mg | ORAL_TABLET | Freq: Two times a day (BID) | ORAL | Status: DC

## 2011-10-19 MED ORDER — HYDROCODONE-ACETAMINOPHEN 10-325 MG PO TABS: 1.0000 | ORAL_TABLET | ORAL | Status: DC | PRN

## 2011-10-19 MED ORDER — GLIPIZIDE ER 10 MG PO TB24: 10.0000 mg | ORAL_TABLET | Freq: Every day | ORAL | Status: DC

## 2011-10-19 MED ORDER — ALLOPURINOL 300 MG PO TABS: 300.0000 mg | ORAL_TABLET | Freq: Every day | ORAL | Status: DC

## 2011-10-19 NOTE — Progress Notes (Signed)
Subjective:    Patient ID: [name] Snow, male    DOB: 1965-07-23, 46 y.o.   MRN: [id number]  HPI 1. DM (diabetes mellitus)  POCT CBC, POCT glucose (manual entry), POCT glycosylated hemoglobin (Hb A1C), Comprehensive metabolic panel, Lipid panel  2. Gout  POCT SEDIMENTATION RATE, Uric Acid  3. DM neuropathy, painful    4. Hip pain, chronic    5. Onychomycosis    6. Legg-Calve-Perthes disease    7. Tophi gouty    His main issue today is to discuss a way to get off of hydrocodone. He has been on this intermittently since her early teen years due to his chronic hip disorder. 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 He has terrible withdrawal symptoms whenever he tries to get off of hydrocodone. He also continues to need treatment for intermitted episodes of gout.  In recent months he has an increase in burning pain in his feet especially at night with decrease in sensation as he notices walking on hot surfaces. He also has some numbness on the tips of his fingers. He has had irregular control of his diabetes. He had had GI intolerance with metformin and has not been able to afford Januvia so continues on Glucotrol plus diet plus weight loss. See 10 pound loss in the last 3 months.  He has had increase in swelling in his right olecranon bursa and is starting to swell with his left elbow as well.  His use of daily colchicine has prevented any gout attacks since his last visit Review of Systems  Constitutional: Positive for fatigue. Negative for fever and activity change.  HENT: Negative for hearing loss and neck pain.   Respiratory: Negative for shortness of breath.   Cardiovascular: Negative for chest pain and leg swelling.       Continues with intermittently elevated blood pressures without a diagnosis of hypertension  Gastrointestinal: Negative for nausea and abdominal pain.  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       Objective:   Physical ExamBlood pressure 154/89 Pupils equal round reactive to light and accommodation Heart regular He has a mild decrease sensation to light touch on the tips of his fingers/negative Tinel's He has a bloated sensation to light touch with Q-tip over plantar aspects of both feet but does have sensation to mild pressure/he has no vibratory sense at the ankles Onychomycosis of all toes    Results for orders placed in visit on 10/19/11  POCT SEDIMENTATION RATE      Component Value Range   POCT SED RATE 60 (*) 0 - 22 (mm/hr)  POCT CBC      Component Value Range   WBC 11.7 (*) 4.6 - 10.2 (K/uL)   Lymph, poc 3.7 (*) 0.6 - 3.4    POC LYMPH PERCENT 31.5  10 - 50 (%L)   MID (cbc) 0.8  0 - 0.9    POC MID % 6.9  0 - 12 (%M)   POC Granulocyte 7.2 (*) 2 - 6.9    Granulocyte percent 61.6  37 - 80 (%G)   RBC 4.82  4.69 - 6.13 (M/uL)   Hemoglobin 13.3 (*) 14.1 - 18.1 (g/dL)   HCT, POC 78.2 (*) 95.6 - 53.7 (%)   MCV 88.7  80 -  97 (fL)   MCH, POC 27.6  27 - 31.2 (pg)   MCHC 31.1 (*) 31.8 - 35.4 (g/dL)   RDW, POC 16.1     Platelet Count, POC 256  142 - 424 (K/uL)   MPV 10.3  0 - 99.8 (fL)  GLUCOSE, POCT (MANUAL RESULT ENTRY)      Component Value Range   POC Glucose 106 (*) 70 - 99 (mg/dl)  POCT GLYCOSYLATED HEMOGLOBIN (HGB A1C)      Component Value Range   Hemoglobin A1C 6.8          Assessment & Plan:   1. DM (diabetes mellitus)  POCT CBC, POCT glucose (manual entry), POCT glycosylated hemoglobin (Hb A1C), Comprehensive metabolic panel, Lipid panel No need for change in therapy with hemoglobin A1c 6.8%   2. Gout  POCT SEDIMENTATION RATE, Uric Acid 2 continue colchicine 0.6 twice a day #60 with 5 refills Start allopurinol 300 mg daily #30 with 5 refills If no attacks in 1 month may discontinue colchicine and use on an intermittent basis   3. DM  neuropathy, painful  He has failed to improved with Lyrica and gabapentin and amitriptyline/he can't afford Cymbalta/we discussed a trial of Effexor and he will wait until he is off hydrocodone before considering this   4. Hip pain, chronic  Now resolved  5. 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 Tophi gouty  Follow with the addition of allopurinol  8. Elevated blood pressure  Check outside blood pressures  9. Nephrolithiasis  Suggest the need for lowering uric acid levels will recheck today   10. Carpal tunnel syndrome  May be related to hand symptoms rather than diabetic neuropathy    Recheck in 2 months sooner if worse Contact about any medication changes after labs are available Given hydrocodone 10/325 #182 start decreasing the dose from one every 4 hours to one every 6 hours and then one every 8 hours producing about 25% per week/he'll call in 6-8 weeks for followup as planned

## 2011-10-20 ENCOUNTER — Encounter: Payer: Self-pay | Admitting: Internal Medicine

## 2011-11-06 ENCOUNTER — Telehealth: Payer: Self-pay

## 2011-11-06 NOTE — Telephone Encounter (Signed)
Pt is calling wanting to get a refill of Prednisone for his Gout.  Pt states that he has been dealing with this for three weeks and is really needing something called in for him.     Pharmacy: [facility] in [place], [place]  Phone#: [phone]

## 2011-11-07 NOTE — Telephone Encounter (Signed)
Patient is a diabetic. Not a good idea to blindly place on prednisone. Recommend RTC to recheck this. Perhaps more is going on.

## 2011-11-07 NOTE — Telephone Encounter (Signed)
Patient stated that his gout is still flaring up and would like an rx for prednisone called in.  Can we do this?

## 2011-11-08 NOTE — Telephone Encounter (Signed)
Spoke with pt advised to recheck. Pt understood

## 2011-11-16 ENCOUNTER — Ambulatory Visit (INDEPENDENT_AMBULATORY_CARE_PROVIDER_SITE_OTHER): Payer: MEDICARE | Admitting: Internal Medicine

## 2011-11-16 VITALS — BP 125/82 | HR 71 | Temp 97.5°F | Resp 20 | Ht 70.5 in | Wt 295.0 lb

## 2011-11-16 DIAGNOSIS — M109 Gout, unspecified: Secondary | ICD-10-CM

## 2011-11-16 DIAGNOSIS — E119 Type 2 diabetes mellitus without complications: Secondary | ICD-10-CM

## 2011-11-16 MED ORDER — GLUCOSE BLOOD VI STRP: ORAL_STRIP | Status: AC

## 2011-11-16 MED ORDER — METHYLPREDNISOLONE ACETATE 80 MG/ML IJ SUSP
120.0000 mg | Freq: Once | INTRAMUSCULAR | Status: AC
  Administered 2011-11-16: 120 mg via INTRAMUSCULAR

## 2011-11-16 MED ORDER — HYDROCODONE-ACETAMINOPHEN 10-325 MG PO TABS: 1.0000 | ORAL_TABLET | ORAL | Status: DC | PRN

## 2011-11-16 MED ORDER — ALLOPURINOL 100 MG PO TABS: ORAL_TABLET | ORAL | Status: DC

## 2011-11-16 MED ORDER — PREDNISONE 10 MG PO TABS: ORAL_TABLET | ORAL | Status: DC

## 2011-11-16 MED ORDER — GLUCOSE BLOOD VI STRP: ORAL_STRIP | Status: DC

## 2011-11-16 NOTE — Progress Notes (Signed)
  Subjective:    Patient ID: [name] Snow, male    DOB: [birth year], 46 y.o.   MRN: [id number]  HPI Patient Active Problem List  Diagnosis  . Obesity, Class III, BMI 40-49.9 (morbid obesity)  . Diabetes mellitus  . Gout  . Legg-Perthes disease  . DM neuropathy, painful  . Hip pain, chronic  . Onychomycosis  . Nephrolithiasis  . Carpal tunnel syndrome   Since last office visit one month ago he has had continuous problems with gout in the right hand and right forearm, in both upper back shoulder areas, and occasionally in other areas. He was unable to stay on allopurinol due to this. Colchicine twice a day is not helping even with the addition of indomethacin. His diabetes has remained in control and last A1c was 6.8 He has reduced his Hydrocodone use by 20% and remains committed to this   Review of SystemsNo chest pain/no edema/no new complaints regarding his peripheral neuropathy/testicular pain has remained stable     Objective:   Physical Exam Vital signs stable except for weight and he has lost 20 pounds in the last year He is in pain with stiffness The right hand is swollen with decreased range of motion secondary to pain and specific tenderness in the wrist and fingers along with a right elbow/shoulders are better at this point The heart is regular with blood pressure 125/82 His gait is stiff but okay with no changes in the hip where he had surgery that are bad       Assessment & Plan:  Problem #1 recalcitrant gout Problem #2 diabetes Problem #3 morbid obesity Problem #4 chronic hydrocodone use for good reasons  Patient Instructions  Colchicine 0.6 twice a day-Continued Prednisone taper 2 follow Depo-Medrol 120 mg Start allopurinol back in 2 weeks if totally controlled-100 mg a day for 10 days, 200 mg a day for 20 days, then 300 mg a day Vitamin C 1 g Maalox twice a day Avoid all fructose-containing foods Avoid alcohol  He is not a candidate for probenecid  since he's had kidney stones We have to gain control of pain before he can reduce his hydrocodone further He may have to monitor his diabetes closely while on steroids and may add an additional dose of glipizide if needed/he will call if this is not controlling sugars Refilled his test strips today Meds ordered this encounter  Medications  .  (ACCU-CHEK AVIVA PLUS) test strip    Sig: Use as instructed    Dispense:  100 each    Refill:  12  .               . methylPREDNISolone acetate (DEPO-MEDROL) injection 120 mg    Sig:   . allopurinol (ZYLOPRIM) 100 MG tablet    Sig: 1 tablet once a day for 10 days and then 2 tablets once a day    Dispense:  50 tablet    Refill:  0  . HYDROcodone-acetaminophen (NORCO) 10-325 MG per tablet    Sig: Take 1 tablet by mouth every 4 (four) hours as needed.    Dispense:  180 tablet    Refill:  0  . predniSONE (DELTASONE) 10 MG tablet    Sig: 3/3/3/2/2/2/1/1/1/1Single daily dose for 10 days    Dispense:  19 tablet    Refill:  0   Recheck for weight

## 2011-11-16 NOTE — Patient Instructions (Addendum)
Colchicine 0.6 twice a day Prednisone taper Start allopurinol back in 2 weeks if totally controlled-100 mg a day for 10 days, 200 mg a day for 20 days, then 300 mg a day Vitamin C 1 g Maalox twice a day Avoid all fructose-containing foods Avoid alcohol

## 2011-11-16 NOTE — Progress Notes (Signed)
  Subjective:    Patient ID: [name] Snow, male    DOB: 10-17-1965, 46 y.o.   MRN: [id number]  HPI  Blood sugars running about 120.  Review of Systems     Objective:   Physical Exam        Assessment & Plan:

## 2011-11-18 ENCOUNTER — Telehealth: Payer: Self-pay

## 2011-11-18 NOTE — Telephone Encounter (Signed)
Pt notified RX is at pharmacy.

## 2011-11-18 NOTE — Telephone Encounter (Signed)
PT STATES HE DID NOT RECEIVE HIS PREDNISONE RX FROM DR DOOLITTLE IN THE GROUP OF OTHER RX'S HE RECEIVED.  BEST PHONE FOR PT [phone]   PHARMACY [facility] IN [place] [phone]

## 2011-12-13 ENCOUNTER — Ambulatory Visit (INDEPENDENT_AMBULATORY_CARE_PROVIDER_SITE_OTHER): Payer: MEDICARE | Admitting: Internal Medicine

## 2011-12-13 VITALS — BP 111/71 | HR 78 | Temp 98.4°F | Resp 16 | Ht 70.25 in | Wt 291.4 lb

## 2011-12-13 DIAGNOSIS — E1140 Type 2 diabetes mellitus with diabetic neuropathy, unspecified: Secondary | ICD-10-CM

## 2011-12-13 DIAGNOSIS — IMO0001 Reserved for inherently not codable concepts without codable children: Secondary | ICD-10-CM

## 2011-12-13 DIAGNOSIS — E119 Type 2 diabetes mellitus without complications: Secondary | ICD-10-CM

## 2011-12-13 DIAGNOSIS — E1010 Type 1 diabetes mellitus with ketoacidosis without coma: Secondary | ICD-10-CM

## 2011-12-13 DIAGNOSIS — N200 Calculus of kidney: Secondary | ICD-10-CM

## 2011-12-13 DIAGNOSIS — M109 Gout, unspecified: Secondary | ICD-10-CM

## 2011-12-13 LAB — POCT URINALYSIS DIPSTICK
Bilirubin, UA: NEGATIVE
Ketones, UA: NEGATIVE
Leukocytes, UA: NEGATIVE
Spec Grav, UA: 1.02
pH, UA: 5

## 2011-12-13 LAB — POCT UA - MICROSCOPIC ONLY
Bacteria, U Microscopic: NEGATIVE
Mucus, UA: NEGATIVE
RBC, urine, microscopic: NEGATIVE

## 2011-12-13 MED ORDER — HYDROCODONE-ACETAMINOPHEN 10-325 MG PO TABS: 1.0000 | ORAL_TABLET | ORAL | Status: DC | PRN

## 2011-12-13 NOTE — Progress Notes (Signed)
Subjective:    Patient ID: [name] Snow, male    DOB: [birth year], 46 y.o.   MRN: [id number]  HPIProblem #1 gout/hyperuricemia-he only had partial response to prednisone plus colchicine and when he started Allopurinol-pain in ankle/knee Was precipitated so he stopped the allopurinol/he also developed a Recent large stone And ended up in the emergency room in [place]=5.1 cm stone-he eventually passed this 4 days later And is asymptomatic at this point/this interrupted his ability to decrease his pain medication No energy and no motivation At this point/very discouraged at his inability to shake the gout  Problem #2 AODM-note her hemoglobin A1c was below 7 glipizide alone   Patient Active Problem List  Diagnosis  . Obesity, Class III, BMI 40-49.9 (morbid obesity)  . Diabetes mellitus  . Gout  . Legg-Perthes disease  . DM neuropathy, painful  . Hip pain, chronic  . Onychomycosis  . Nephrolithiasis  . Carpal tunnel syndrome    Review of Systems No fever chills or night sweats Appetite is decreased but there is no weight loss No peripheral edema    Objective:   Physical Exam Filed Vitals:   12/13/11 1302  BP: 111/71  Pulse: 78  Temp: 98.4 F (36.9 C)  Resp: 16  In mild distress Right hand is still puffy but no redness/is tender over the dorsum of the hand especially just proximal to metacarpals 23 and 4 There some tenderness at the wrist and a decreased range of motion do to pain No other joints seem effective at this point/there is nontender tophi the right elbow         Results for orders placed in visit on 12/13/11  POCT UA - MICROSCOPIC ONLY      Component Value Range   WBC, Ur, HPF, POC 0-1     RBC, urine, microscopic neg     Bacteria, U Microscopic neg     Mucus, UA neg     Epithelial cells, urine per micros 0-1     Crystals, Ur, HPF, POC neg     Casts, Ur, LPF, POC neg     Yeast, UA neg    POCT URINALYSIS DIPSTICK      Component Value Range   Color, UA yellow     Clarity, UA slightly cloudy     Glucose, UA neg     Bilirubin, UA neg     Ketones, UA neg     Spec Grav, UA 1.020     Blood, UA neg     pH, UA 5.0     Protein, UA neg     Urobilinogen, UA 0.2     Nitrite, UA neg     Leukocytes, UA Negative    He is drinking cheer wine in the exam room  Assessment & Plan:  Problem #1 uncontrolled gout Continue colchicine 0.6 twice a day Stay off allopurinol for now Change diet drastically/increase berries off all types Consider flaxseed or fish oil Discussed other homeopathic or integrative medicine regimens He needs significant weight loss/bbariatric surgery may have to be an option in this case  Problem #2 AODM-controlled  Problem #3 chronic pain and tolerance with hydrocodone  Problem 4 hypertension-now very stable  Problem #5 diabetic neuropathy-of interest his symptoms resolved during the time that he was experiencing the kidney pain/stone for 5-7 days  Meds ordered this encounter  Medications  . HYDROcodone-acetaminophen (NORCO) 10-325 MG per tablet    Sig: Take 1 tablet by mouth every 4 (four) hours as needed.  Dispense:  180 tablet    Refill:  2   Followup 2-3 months or sooner if gout is precipitated Consider peripheral nerve conduction studies of the right hand/cconsider hand orthotic consult/consider rheumatology consult-he is against all of the above

## 2012-03-03 ENCOUNTER — Other Ambulatory Visit: Payer: Self-pay | Admitting: Internal Medicine

## 2012-03-29 ENCOUNTER — Ambulatory Visit (INDEPENDENT_AMBULATORY_CARE_PROVIDER_SITE_OTHER): Payer: MEDICARE | Admitting: Internal Medicine

## 2012-03-29 ENCOUNTER — Ambulatory Visit: Payer: MEDICARE

## 2012-03-29 VITALS — BP 138/80 | HR 83 | Temp 97.7°F | Resp 18 | Ht 70.5 in | Wt 309.0 lb

## 2012-03-29 DIAGNOSIS — M109 Gout, unspecified: Secondary | ICD-10-CM

## 2012-03-29 DIAGNOSIS — J45909 Unspecified asthma, uncomplicated: Secondary | ICD-10-CM

## 2012-03-29 DIAGNOSIS — I10 Essential (primary) hypertension: Secondary | ICD-10-CM

## 2012-03-29 DIAGNOSIS — B354 Tinea corporis: Secondary | ICD-10-CM

## 2012-03-29 DIAGNOSIS — E119 Type 2 diabetes mellitus without complications: Secondary | ICD-10-CM

## 2012-03-29 DIAGNOSIS — R042 Hemoptysis: Secondary | ICD-10-CM

## 2012-03-29 DIAGNOSIS — Z23 Encounter for immunization: Secondary | ICD-10-CM

## 2012-03-29 DIAGNOSIS — E1140 Type 2 diabetes mellitus with diabetic neuropathy, unspecified: Secondary | ICD-10-CM

## 2012-03-29 DIAGNOSIS — R05 Cough: Secondary | ICD-10-CM

## 2012-03-29 DIAGNOSIS — R059 Cough, unspecified: Secondary | ICD-10-CM

## 2012-03-29 DIAGNOSIS — R21 Rash and other nonspecific skin eruption: Secondary | ICD-10-CM

## 2012-03-29 LAB — COMPREHENSIVE METABOLIC PANEL
ALT: 13 U/L (ref 0–53)
CO2: 24 mEq/L (ref 19–32)
Creat: 1.14 mg/dL (ref 0.50–1.35)
Glucose, Bld: 189 mg/dL — ABNORMAL HIGH (ref 70–99)
Total Bilirubin: 0.4 mg/dL (ref 0.3–1.2)

## 2012-03-29 LAB — POCT CBC
Granulocyte percent: 55 %G (ref 37–80)
HCT, POC: 41.8 % — AB (ref 43.5–53.7)
Hemoglobin: 12.9 g/dL — AB (ref 14.1–18.1)
POC Granulocyte: 5.8 (ref 2–6.9)
RDW, POC: 14.6 %

## 2012-03-29 LAB — LIPID PANEL
HDL: 34 mg/dL — ABNORMAL LOW (ref >39)
Triglycerides: 647 mg/dL — ABNORMAL HIGH (ref <150)

## 2012-03-29 LAB — POCT SKIN KOH: Skin KOH, POC: POSITIVE

## 2012-03-29 LAB — URIC ACID: Uric Acid, Serum: 8.3 mg/dL — ABNORMAL HIGH (ref 4.0–7.8)

## 2012-03-29 MED ORDER — HYDROCODONE-ACETAMINOPHEN 10-325 MG PO TABS: 1.0000 | ORAL_TABLET | ORAL | Status: DC | PRN

## 2012-03-29 MED ORDER — LISINOPRIL 10 MG PO TABS: 10.0000 mg | ORAL_TABLET | Freq: Every day | ORAL | Status: DC

## 2012-03-29 MED ORDER — GLIPIZIDE ER 10 MG PO TB24: 10.0000 mg | ORAL_TABLET | Freq: Every day | ORAL | Status: DC

## 2012-03-29 MED ORDER — FLUCONAZOLE 150 MG PO TABS: 150.0000 mg | ORAL_TABLET | Freq: Every day | ORAL | Status: DC

## 2012-03-29 MED ORDER — AZITHROMYCIN 250 MG PO TABS: ORAL_TABLET | ORAL | Status: DC

## 2012-03-29 NOTE — Progress Notes (Addendum)
Subjective:    Patient ID: [name] Snow, male    DOB: 09-Mar-1966, 46 y.o.   MRN: [id number]  HPIPt presents to clinic with  Multiple concerns 1- need med RF for DM and HTN - almost out of his meds.  Has had a few high blood sugars. He knows part of his problem is his sugary drinks.Not adhering to diet 2- need refills of hydrocodone - taking it for gout, cough related to cold and recent kidney stone - Pain in hip and other degenerative joints 3- sick for the last 2 wks with cough but no congestion - coughs mainly at night with some yellow sputum with blood in it - cough and SOB with activity -- some wheezing - esp with the cough - only ok if he takes shallow breaths.  Feels feverish but no documented fevers.Very worried about the blood in his sputum as he had a relative die of lung cancer recently. 4- rash on arms - this is the rash that he gets before he gets sick that he had for years after he got mono in the 80s.  The rash itchy - spontaneously goes away. 5-Continues with occasional burning in the extremities particularly his legs but this is not enough to keep him awake at night or affect his balance during the daytime 6- Has regained 15-20 pounds since July   Review of Systems Stone but no gout--started as test pain as usual last week   No weight loss or night sweats No chest pain or palpitation Objective:   Physical Exam Filed Vitals:   03/29/12 1709  BP: 138/80  Pulse: 83  Temp: 97.7 F (36.5 C)  Resp: 18   HEENT clear Heart regular without murmur Lungs with rales and rhonchi bilaterally that clear partially with cough/mild wheezing with forced expiration Extremities with no edema and no sensory losses on the feet today Joint clear      Results for orders placed in visit on 03/29/12  POCT GLYCOSYLATED HEMOGLOBIN (HGB A1C)      Component Value Range   Hemoglobin A1C 9.1    POCT CBC      Component Value Range   WBC 10.6 (*) 4.6 - 10.2 K/uL   Lymph, poc 4.2 (*) 0.6 -  3.4   POC LYMPH PERCENT 39.6  10 - 50 %L   MID (cbc) 0.6  0 - 0.9   POC MID % 5.4  0 - 12 %M   POC Granulocyte 5.8  2 - 6.9   Granulocyte percent 55.0  37 - 80 %G   RBC 4.58 (*) 4.69 - 6.13 M/uL   Hemoglobin 12.9 (*) 14.1 - 18.1 g/dL   HCT, POC 62.1 (*) 30.8 - 53.7 %   MCV 91.3  80 - 97 fL   MCH, POC 28.2  27 - 31.2 pg   MCHC 30.9 (*) 31.8 - 35.4 g/dL   RDW, POC 65.7     Platelet Count, POC 272  142 - 424 K/uL   MPV 10.6  0 - 99.8 fL  POCT SKIN KOH      Component Value Range   Skin KOH, POC Positive     [facility] reading (PRIMARY) by  Dr. Alese Furniss=NAD   Assessment & Plan:   1. DM (diabetes mellitus)  POCT glycosylated hemoglobin (Hb A1C), Lipid panel, glipiZIDE (GLUCOTROL XL) 10 MG 24 hr tablet  2. HTN (hypertension)  Comprehensive metabolic panel, lisinopril (PRINIVIL,ZESTRIL) 10 MG tablet  3. Gout  Uric Acid  4. Cough  POCT CBC  5. Rash  POCT Skin KOH  6. Reactive airway disease  azithromycin (ZITHROMAX) 250 MG tablet  7. Obesity, Class III, BMI 40-49.9 (morbid obesity)    8. DM neuropathy, painful  HYDROcodone-acetaminophen (NORCO) 10-325 MG per tablet  9. Tinea corporis  fluconazole (DIFLUCAN) 150 MG tablet  10. Hemoptysis  DG Chest 2 View   Meds ordered this encounter  Medications  . lisinopril (PRINIVIL,ZESTRIL) 10 MG tablet    Sig: Take 1 tablet (10 mg total) by mouth daily.    Dispense:  90 tablet    Refill:  1  . glipiZIDE (GLUCOTROL XL) 10 MG 24 hr tablet    Sig: Take 1 tablet (10 mg total) by mouth daily.    Dispense:  90 tablet    Refill:  1  . azithromycin (ZITHROMAX) 250 MG tablet    Sig: As packaged    Dispense:  6 tablet    Refill:  0  . fluconazole (DIFLUCAN) 150 MG tablet    Sig: Take 1 tablet (150 mg total) by mouth daily.    Dispense:  3 tablet    Refill:  1  . HYDROcodone-acetaminophen (NORCO) 10-325 MG per tablet    Sig: Take 1 tablet by mouth every 4 (four) hours as needed for pain.    Dispense:  180 tablet    Refill:  2   Metformin causes  nausea and GI distress He has had problems with statins He can't afford Januvia He recognizes that his diet and weight loss are the critical things he needs to do well : 220 pounds is an initial goal for the next 2 years Neuropathy needs to be followed closely/Elavil has caused some daytime somnolence in the past  Addendum: Results for orders placed in visit on 03/29/12  POCT GLYCOSYLATED HEMOGLOBIN (HGB A1C)      Component Value Range   Hemoglobin A1C 9.1    LIPID PANEL      Component Value Range   Cholesterol 222 (*) 0 - 200 mg/dL   Triglycerides 161 (*) <150 mg/dL   HDL 34 (*) >09 mg/dL   Total CHOL/HDL Ratio 6.5     VLDL NOT CALC  0 - 40 mg/dL   LDL Cholesterol      COMPREHENSIVE METABOLIC PANEL      Component Value Range   Sodium 135  135 - 145 mEq/L   Potassium 4.4  3.5 - 5.3 mEq/L   Chloride 102  96 - 112 mEq/L   CO2 24  19 - 32 mEq/L   Glucose, Bld 189 (*) 70 - 99 mg/dL   BUN 22  6 - 23 mg/dL   Creat 6.04  5.40 - 9.81 mg/dL   Total Bilirubin 0.4  0.3 - 1.2 mg/dL   Alkaline Phosphatase 65  39 - 117 U/L   AST 12  0 - 37 U/L   ALT 13  0 - 53 U/L   Total Protein 7.3  6.0 - 8.3 g/dL   Albumin 4.1  3.5 - 5.2 g/dL   Calcium 9.4  8.4 - 19.1 mg/dL  URIC ACID      Component Value Range   Uric Acid, Serum 8.3 (*) 4.0 - 7.8 mg/dL  POCT CBC      Component Value Range   WBC 10.6 (*) 4.6 - 10.2 K/uL   Lymph, poc 4.2 (*) 0.6 - 3.4   POC LYMPH PERCENT 39.6  10 - 50 %L   MID (cbc) 0.6  0 -  0.9   POC MID % 5.4  0 - 12 %M   POC Granulocyte 5.8  2 - 6.9   Granulocyte percent 55.0  37 - 80 %G   RBC 4.58 (*) 4.69 - 6.13 M/uL   Hemoglobin 12.9 (*) 14.1 - 18.1 g/dL   HCT, POC 96.0 (*) 45.4 - 53.7 %   MCV 91.3  80 - 97 fL   MCH, POC 28.2  27 - 31.2 pg   MCHC 30.9 (*) 31.8 - 35.4 g/dL   RDW, POC 09.8     Platelet Count, POC 272  142 - 424 K/uL   MPV 10.6  0 - 99.8 fL  POCT SKIN KOH      Component Value Range   Skin KOH, POC Positive

## 2012-03-29 NOTE — Progress Notes (Signed)
q 

## 2012-04-05 ENCOUNTER — Encounter: Payer: Self-pay | Admitting: Internal Medicine

## 2012-06-18 ENCOUNTER — Ambulatory Visit (INDEPENDENT_AMBULATORY_CARE_PROVIDER_SITE_OTHER): Payer: MEDICARE | Admitting: Internal Medicine

## 2012-06-18 VITALS — BP 134/84 | HR 91 | Temp 99.0°F | Resp 17 | Ht 71.5 in | Wt 310.0 lb

## 2012-06-18 DIAGNOSIS — E66813 Obesity, class 3: Secondary | ICD-10-CM

## 2012-06-18 DIAGNOSIS — E1140 Type 2 diabetes mellitus with diabetic neuropathy, unspecified: Secondary | ICD-10-CM

## 2012-06-18 DIAGNOSIS — E1149 Type 2 diabetes mellitus with other diabetic neurological complication: Secondary | ICD-10-CM

## 2012-06-18 DIAGNOSIS — M109 Gout, unspecified: Secondary | ICD-10-CM

## 2012-06-18 DIAGNOSIS — E119 Type 2 diabetes mellitus without complications: Secondary | ICD-10-CM

## 2012-06-18 MED ORDER — HYDROCODONE-ACETAMINOPHEN 10-325 MG PO TABS: 1.0000 | ORAL_TABLET | ORAL | Status: DC | PRN

## 2012-06-18 NOTE — Progress Notes (Signed)
  Subjective:    Patient ID: [name] Snow, male    DOB: December 07, 1965, 47 y.o.   MRN: [id number]  HPI here for followup/missed routine appointment and needs medication Neuropathy symptoms and lower extr continue to bother him although he is not willing to take Neurontin Gout is active again- left knee. He feels that colchicine makes his gout precipitated in other joints so is not taking that Whenever he tries to diet to lose weight he precipitates gout Weight has remained elevated Random blood sugars are elevated/he is unsure what control he has/he has not been able to eliminate sugar beverages Continues to use Norco for pain-see history of joint replacement  Review of Systems No fever chills or night sweats No chest pain or palpitations No edema No shortness of breath other than that related to his inactivity/deconditioning    Objective:   Physical Exam BP 134/84  Pulse 91  Temp 99 F (37.2 C) (Oral)  Resp 17  Ht 5' 11.5" (1.816 m)  Wt 310 lb (140.615 kg)  BMI 42.63 kg/m2  SpO2 98% HEENT clear Left knee swollen but not erythematous Range of motion good but painful Tender to palpation       Assessment & Plan:  Problem #1 gout Problem #2 morbid obesity Problem #3 uncontrolled diabetes-hemoglobin A1c was 9.1 at ov 2 months ago  Meds ordered this encounter  Medications  . HYDROcodone-acetaminophen (NORCO) 10-325 MG per tablet    Sig: Take 1 tablet by mouth every 4 (four) hours as needed for pain.    Dispense:  180 tablet    Refill:  2   Continue glipizide but increased to twice a day History of not tolerating metformin Continue lisinopril NSAIDs cause GI distress Prednisone worsens glucose intolerance He needs to restrict all intake of corn syrup Appointment 104 for followup in 6 weeks

## 2012-06-19 ENCOUNTER — Encounter: Payer: Self-pay | Admitting: Internal Medicine

## 2012-06-24 NOTE — Progress Notes (Signed)
Called patient to schedule appt and no answer.

## 2012-06-25 NOTE — Progress Notes (Signed)
Made appt with Dr. Merla Riches for 3/26 with patient.

## 2012-08-11 ENCOUNTER — Other Ambulatory Visit: Payer: Self-pay | Admitting: *Deleted

## 2012-08-11 ENCOUNTER — Ambulatory Visit (INDEPENDENT_AMBULATORY_CARE_PROVIDER_SITE_OTHER): Payer: MEDICARE | Admitting: Internal Medicine

## 2012-08-11 VITALS — BP 128/78 | HR 75 | Temp 97.8°F | Resp 16 | Ht 70.5 in | Wt 309.0 lb

## 2012-08-11 DIAGNOSIS — IMO0001 Reserved for inherently not codable concepts without codable children: Secondary | ICD-10-CM

## 2012-08-11 DIAGNOSIS — G894 Chronic pain syndrome: Secondary | ICD-10-CM

## 2012-08-11 DIAGNOSIS — E1149 Type 2 diabetes mellitus with other diabetic neurological complication: Secondary | ICD-10-CM

## 2012-08-11 DIAGNOSIS — E785 Hyperlipidemia, unspecified: Secondary | ICD-10-CM

## 2012-08-11 DIAGNOSIS — M109 Gout, unspecified: Secondary | ICD-10-CM

## 2012-08-11 DIAGNOSIS — E1140 Type 2 diabetes mellitus with diabetic neuropathy, unspecified: Secondary | ICD-10-CM

## 2012-08-11 DIAGNOSIS — I10 Essential (primary) hypertension: Secondary | ICD-10-CM

## 2012-08-11 DIAGNOSIS — Z125 Encounter for screening for malignant neoplasm of prostate: Secondary | ICD-10-CM

## 2012-08-11 DIAGNOSIS — E119 Type 2 diabetes mellitus without complications: Secondary | ICD-10-CM

## 2012-08-11 LAB — POCT UA - MICROSCOPIC ONLY
Bacteria, U Microscopic: NEGATIVE
Crystals, Ur, HPF, POC: NEGATIVE

## 2012-08-11 LAB — CBC WITH DIFFERENTIAL/PLATELET
Basophils Relative: 0 % (ref 0–1)
Eosinophils Absolute: 0.1 10*3/uL (ref 0.0–0.7)
Eosinophils Relative: 2 % (ref 0–5)
Hemoglobin: 12.3 g/dL — ABNORMAL LOW (ref 13.0–17.0)
Lymphs Abs: 2.9 10*3/uL (ref 0.7–4.0)
MCH: 27.1 pg (ref 26.0–34.0)
MCHC: 32.9 g/dL (ref 30.0–36.0)
MCV: 82.4 fL (ref 78.0–100.0)
Monocytes Absolute: 0.5 10*3/uL (ref 0.1–1.0)
Monocytes Relative: 6 % (ref 3–12)
Neutrophils Relative %: 58 % (ref 43–77)
RBC: 4.54 MIL/uL (ref 4.22–5.81)

## 2012-08-11 LAB — POCT URINALYSIS DIPSTICK
Bilirubin, UA: NEGATIVE
Glucose, UA: 250
Ketones, UA: NEGATIVE
Leukocytes, UA: NEGATIVE
Nitrite, UA: NEGATIVE
pH, UA: 5.5

## 2012-08-11 LAB — LIPID PANEL
Cholesterol: 233 mg/dL — ABNORMAL HIGH (ref 0–200)
HDL: 38 mg/dL — ABNORMAL LOW (ref >39)
Total CHOL/HDL Ratio: 6.1 Ratio
Triglycerides: 340 mg/dL — ABNORMAL HIGH (ref <150)
VLDL: 68 mg/dL — ABNORMAL HIGH (ref 0–40)

## 2012-08-11 LAB — COMPREHENSIVE METABOLIC PANEL
Alkaline Phosphatase: 68 U/L (ref 39–117)
BUN: 24 mg/dL — ABNORMAL HIGH (ref 6–23)
CO2: 24 mEq/L (ref 19–32)
Creat: 1.08 mg/dL (ref 0.50–1.35)
Glucose, Bld: 220 mg/dL — ABNORMAL HIGH (ref 70–99)
Sodium: 137 mEq/L (ref 135–145)
Total Bilirubin: 0.4 mg/dL (ref 0.3–1.2)
Total Protein: 7.3 g/dL (ref 6.0–8.3)

## 2012-08-11 LAB — URIC ACID: Uric Acid, Serum: 6.8 mg/dL (ref 4.0–7.8)

## 2012-08-11 LAB — POCT GLYCOSYLATED HEMOGLOBIN (HGB A1C): Hemoglobin A1C: 11.9

## 2012-08-11 MED ORDER — GABAPENTIN 300 MG PO CAPS: 600.0000 mg | ORAL_CAPSULE | Freq: Every day | ORAL | Status: DC

## 2012-08-11 MED ORDER — LISINOPRIL 10 MG PO TABS: 10.0000 mg | ORAL_TABLET | Freq: Every day | ORAL | Status: DC

## 2012-08-11 MED ORDER — METFORMIN HCL 500 MG PO TABS: ORAL_TABLET | ORAL | Status: DC

## 2012-08-11 MED ORDER — ALLOPURINOL 100 MG PO TABS: 100.0000 mg | ORAL_TABLET | Freq: Every day | ORAL | Status: DC

## 2012-08-11 MED ORDER — HYDROCODONE-ACETAMINOPHEN 10-325 MG PO TABS: 1.0000 | ORAL_TABLET | ORAL | Status: DC | PRN

## 2012-08-11 MED ORDER — KETOPROFEN 75 MG PO CAPS: ORAL_CAPSULE | ORAL | Status: DC

## 2012-08-11 NOTE — Progress Notes (Signed)
Subjective:    Patient ID: [name] Snow, male    DOB: 04-Nov-1965, 47 y.o.   MRN: [id number]  HPIhere for f/u Patient Active Problem List  Diagnosis  . 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 Diabetes mellitus-double glipizide at last visit but he continues with nocturia, thirst, polyuria, and a change in vision . He has had some gastrointestinal problems with metformin in the past and this is why he is no longer on this drug .  Marland Kitchen Gout-his gout finally seems in remission though colchicine was ineffective and in fact seemed to make it worse /he used Indocin from his wife until he became pain-free   . Legg-Perthes disease  . DM neuropathy, painful-the symptoms have been variable and in fact today he has no symptoms and has been good for the past week /he took gabapentin at bedtime which seemed to help although he is out of this   . 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 Onychomycosis-toenails /no current pain   . Nephrolithiasis-wonders if he is passing some stones as he occasionally has urinary stream diversion or mild obstruction //noticed that he has chronic testicular pain which has been evaluated by urology on 2 different occasions without an answer /  . Carpal tunnel syndrome    -  Chronic pain-he continues to require treatment with hydrocodone for his various pains including his neuropathy/he wishes he was not in need of pain medication which he's been on consistently since 2000. He attempted cold [place] withdrawal last month and did well for 3 or 4 days before precipitous diarrhea for 8 or 9 hours which resolved when he restarted the hydrocodone. He has never done well with other pain medicines and wishes to avoid any other opiates because of his family history of addiction and misuse in both his brother and  father. His brother is now in a Suboxone program.     Review of Systems No fever chills or night sweats No chest pain or palpitations Continues with skin lesions that seem fungal but do not respond to Diflucan particularly growing lesions Continues with toenail deformities from fungus but no pain No shortness of breath     Objective:   Physical Exam BP 128/78  Pulse 75  Temp(Src) 97.8 F (36.6 C)  Resp 16  Ht 5' 10.5" (1.791 m)  Wt 309 lb (140.161 kg)  BMI 43.7 kg/m2 HEENT clear Heart regular without murmur No peripheral edema There is a distinct sensory loss over the great dorsal and volar including the vulvar MTP areas bilaterally/pressure sensation intact along the other toes Good peripheral pulses Multiple toenails with fungal changes but no redness or tenderness   Results for orders placed in visit on 08/11/12  POCT GLYCOSYLATED HEMOGLOBIN (HGB A1C)      Result Value Range   Hemoglobin A1C 11.9    POCT URINALYSIS DIPSTICK      Result Value Range   Color, UA yellow     Clarity, UA clear     Glucose, UA 250     Bilirubin, UA neg     Ketones, UA neg     Spec Grav, UA 1.020     Blood, UA trace     pH, UA 5.5     Protein, UA neg     Urobilinogen, UA 0.2  Nitrite, UA neg     Leukocytes, UA Negative    POCT UA - MICROSCOPIC ONLY      Result Value Range   WBC, Ur, HPF, POC 0-1     RBC, urine, microscopic 0-2     Bacteria, U Microscopic neg     Mucus, UA trace     Epithelial cells, urine per micros 0-5     Crystals, Ur, HPF, POC neg     Casts, Ur, LPF, POC neg     Yeast, UA neg           Assessment & Plan:  Problem #1 obesity  Problem #2 uncontrolled diabetes mellitus   Glucotrol XL 10 (glipizide) go back to one a day  Restart metformin /continue dietary improvement   Continue lisinopril  He has had intolerance to statins in the past Problem #3 diabetic neuropathy   Gabapentin 600 hs Problem #4 gout  Ketoprofen 75 mg every 6-8 hours for flare  (Indocin not covered)  Start allopurinol-watch for rash as hypersensitivity is increased with concomitant use of Ace  inhibitor  History of nephrolithiasis so watch for stone Problem #5 chronic pain from a variety of sources  Continue hydrocodone  Given prescription to hold for Zofran and Lomotil if he would like to try withdrawal again  We will consider referral to a Suboxone program if he chooses  Meds ordered this encounter  Medications  . metFORMIN (GLUCOPHAGE) 500 MG tablet    Sig: 1 tab daily with meal for 7 days then 1 bid with meals for 7 days then 1 am and 2 pm for 7 days before finally using 2 tabs bid with meals    Dispense:  120 tablet    Refill:  2  . HYDROcodone-acetaminophen (NORCO) 10-325 MG per tablet    Sig: Take 1 tablet by mouth every 4 (four) hours as needed for pain.    Dispense:  180 tablet    Refill:  2  . gabapentin (NEURONTIN) 300 MG capsule    Sig: Take 2 capsules (600 mg total) by mouth at bedtime.    Dispense:  60 capsule    Refill:  2  . lisinopril (PRINIVIL,ZESTRIL) 10 MG tablet    Sig: Take 1 tablet (10 mg total) by mouth daily.    Dispense:  90 tablet    Refill:  1  . ketoprofen (ORUDIS) 75 MG capsule    Sig: Every 6-8 hrs during acute gout    Dispense:  90 capsule    Refill:  1  . allopurinol (ZYLOPRIM) 100 MG tablet    Sig: Take 1 tablet (100 mg total) by mouth daily. After 2 weeks increase to 2 per day as single dose/discontinue at once if rash develops    Dispense:  60 tablet    Refill:  1   Followup 6 weeks 45 minute office visit

## 2012-08-13 ENCOUNTER — Encounter: Payer: Self-pay | Admitting: Internal Medicine

## 2012-09-03 ENCOUNTER — Telehealth: Payer: Self-pay

## 2012-09-03 DIAGNOSIS — E1140 Type 2 diabetes mellitus with diabetic neuropathy, unspecified: Secondary | ICD-10-CM

## 2012-09-03 NOTE — Telephone Encounter (Signed)
Pt states that he lost his rx for hydrocodone. Best #  [phone]

## 2012-09-04 NOTE — Telephone Encounter (Signed)
He's honest//ok to call it in

## 2012-09-06 NOTE — Telephone Encounter (Signed)
Okay, called him, advised it is called in. Called in the #180 with 1 refill that is remaining on his Rx

## 2012-09-29 ENCOUNTER — Encounter: Payer: Self-pay | Admitting: Internal Medicine

## 2012-09-29 ENCOUNTER — Ambulatory Visit (INDEPENDENT_AMBULATORY_CARE_PROVIDER_SITE_OTHER): Payer: MEDICARE | Admitting: Internal Medicine

## 2012-09-29 VITALS — BP 144/82 | HR 76 | Temp 98.2°F | Resp 16 | Ht 70.0 in | Wt 301.8 lb

## 2012-09-29 DIAGNOSIS — G8929 Other chronic pain: Secondary | ICD-10-CM

## 2012-09-29 DIAGNOSIS — E1142 Type 2 diabetes mellitus with diabetic polyneuropathy: Secondary | ICD-10-CM

## 2012-09-29 DIAGNOSIS — E1149 Type 2 diabetes mellitus with other diabetic neurological complication: Secondary | ICD-10-CM

## 2012-09-29 DIAGNOSIS — E119 Type 2 diabetes mellitus without complications: Secondary | ICD-10-CM

## 2012-09-29 DIAGNOSIS — IMO0001 Reserved for inherently not codable concepts without codable children: Secondary | ICD-10-CM

## 2012-09-29 DIAGNOSIS — E1140 Type 2 diabetes mellitus with diabetic neuropathy, unspecified: Secondary | ICD-10-CM

## 2012-09-29 DIAGNOSIS — M109 Gout, unspecified: Secondary | ICD-10-CM

## 2012-09-29 LAB — GLUCOSE, POCT (MANUAL RESULT ENTRY): POC Glucose: 328 mg/dl — AB (ref 70–99)

## 2012-09-29 LAB — POCT GLYCOSYLATED HEMOGLOBIN (HGB A1C): Hemoglobin A1C: 13.9

## 2012-09-29 MED ORDER — HYDROCODONE-ACETAMINOPHEN 10-325 MG PO TABS: 1.0000 | ORAL_TABLET | ORAL | Status: DC | PRN

## 2012-09-29 NOTE — Progress Notes (Signed)
  Subjective:    Patient ID: [name] Snow, male    DOB: [birth year], 47 y.o.   MRN: [id number]  HPIf/u Type II or unspecified type diabetes mellitus without mention of complication, uncontrolled --- for some reason he stopped Glucotrol/trying to advance metformin to 2000 the day created GI intolerance with persistent nausea He is now at 1000 a day in 2 doses-  DM neuropathy, painful -Neurontin helpful Hip pain, chronic, unspecified laterality-postoperatively still needs pain medication Obesity, Class III, BMI 40-49.9 (morbid obesity) Gout--- no episodes since last visit/did not start allopurinol   Stress from concern about his mother  Review of Systems No weight loss No fevers or night sweats No chest pain or palpitations No shortness of breath No peripheral edema    Objective:   Physical Exam BP 144/82  Pulse 76  Temp(Src) 98.2 F (36.8 C) (Oral)  Resp 16  Ht 5\' 10"  (1.778 m)  Wt 301 lb 12.8 oz (136.896 kg)  BMI 43.3 kg/m2  SpO2 96% Continues overweight HEENT clear Heart regular without murmur No peripheral edema Good peripheral pulses Sensation to monofilament absent under toes   Results for orders placed in visit on 09/29/12  GLUCOSE, POCT (MANUAL RESULT ENTRY)      Result Value Range   POC Glucose 328 (*) 70 - 99 mg/dl  POCT GLYCOSYLATED HEMOGLOBIN (HGB A1C)      Result Value Range   Hemoglobin A1C 13.9         Assessment & Plan:  Problem #1 uncontrolled diabetes He is given information to discuss with his pharmacist about whether we choose to Lantus or one of the new injectables/it might be possible to use glipizide plus metformin plus GP-1Agonists altho I favor Lantus( he is hesitant about injections)  Problem #2 gout stable--not on allopurinol or any other therapy Problem #3 hypertension no change Problem #4 obesity-once again we spent time discussing the absolute necessity for weight loss as a way to control all of his problems/also about the weight  loss necessary for the rest of his family particularly his fourth grade son who weighs over 190 pounds//not interested in bariatric surgery for himself// Problem #5 diabetic neuropathy-continue Neurontin

## 2012-09-30 LAB — MICROALBUMIN, URINE: Microalb, Ur: 1.15 mg/dL (ref 0.00–1.89)

## 2012-10-03 ENCOUNTER — Encounter: Payer: Self-pay | Admitting: Internal Medicine

## 2012-10-13 ENCOUNTER — Ambulatory Visit: Payer: MEDICARE | Admitting: Internal Medicine

## 2012-10-19 ENCOUNTER — Telehealth: Payer: Self-pay

## 2012-10-19 DIAGNOSIS — E119 Type 2 diabetes mellitus without complications: Secondary | ICD-10-CM

## 2012-10-19 MED ORDER — INSULIN GLARGINE 100 UNIT/ML ~~LOC~~ SOLN: 25.0000 [IU] | Freq: Every day | SUBCUTANEOUS | Status: DC

## 2012-10-19 NOTE — Telephone Encounter (Signed)
Start Lantus .2 units per kilogram at bedtime= 25 units Meds ordered this encounter  Medications  . insulin glargine (LANTUS) 100 UNIT/ML injection    Sig: Inject 0.25 mLs (25 Units total) into the skin at bedtime. Include syringes and needles    Dispense:  10 mL    Refill:  12   call with results of first morning sugar in 2 weeks

## 2012-10-19 NOTE — Telephone Encounter (Signed)
Great, thanks. I have called patient to advise. No answer called his wife.

## 2012-10-19 NOTE — Telephone Encounter (Signed)
PT STATES IT IS REALLY IMPORTANT THE DR CALL HIM IN SOME LAN TUS. PLEASE CALL [phone] OR HIS WIFE BRENDA'S CELL AT [phone]    [facility] [place]

## 2012-10-26 ENCOUNTER — Telehealth: Payer: Self-pay

## 2012-10-26 NOTE — Telephone Encounter (Signed)
PT STATES HE WAS GIVEN 25 UNITS OF LANTUS AND HIS SUGAR IS STILL REALLY HIGH. IN THE MORNINGS IT IS 300 AND IN EVENINGS IT IS 400 DOESN'T KNOW WHAT TO DO. PLEASE CALL [phone] OR HIS [facility] CELL AT [phone]

## 2012-10-28 NOTE — Telephone Encounter (Signed)
Advised pt to increase Lantus at night to 50 units. Pt understands and will document sugars for the next week. Pt understands to call us back for any concerns before hand.

## 2012-10-28 NOTE — Telephone Encounter (Signed)
Increase lantus to 50 units a day and record sugars for one week-then call us with values next week to adjust further

## 2012-11-06 ENCOUNTER — Other Ambulatory Visit: Payer: Self-pay | Admitting: Internal Medicine

## 2012-11-17 ENCOUNTER — Telehealth: Payer: Self-pay

## 2012-11-17 DIAGNOSIS — E119 Type 2 diabetes mellitus without complications: Secondary | ICD-10-CM

## 2012-11-17 NOTE — Telephone Encounter (Signed)
Pended please advise.  

## 2012-11-17 NOTE — Telephone Encounter (Signed)
PT STATES THE LANTUS HE WAS PUT ON STILL ISN'T BRINGING HIS SUGAR DOWN. IT WAS 25 UNITS AND THEN THE DR INCREASE IT TO 50 UNITS. THE PHARMACY STILL HAVE IT AS 25. NEED TO HAVE [place] TO CALL AND CORRECT IT. PLEASE CALL HIS WIFE BRENDA ON HER CELL AT [phone]

## 2012-11-18 MED ORDER — INSULIN GLARGINE 100 UNIT/ML ~~LOC~~ SOLN: 50.0000 [IU] | Freq: Every day | SUBCUTANEOUS | Status: DC

## 2012-11-18 NOTE — Telephone Encounter (Signed)
Sent!

## 2012-12-03 ENCOUNTER — Ambulatory Visit (INDEPENDENT_AMBULATORY_CARE_PROVIDER_SITE_OTHER): Payer: Medicare Other | Admitting: Internal Medicine

## 2012-12-03 VITALS — BP 140/76 | HR 81 | Temp 98.0°F | Resp 18 | Ht 70.0 in | Wt 303.0 lb

## 2012-12-03 DIAGNOSIS — IMO0001 Reserved for inherently not codable concepts without codable children: Secondary | ICD-10-CM

## 2012-12-03 DIAGNOSIS — E119 Type 2 diabetes mellitus without complications: Secondary | ICD-10-CM

## 2012-12-03 DIAGNOSIS — G44209 Tension-type headache, unspecified, not intractable: Secondary | ICD-10-CM

## 2012-12-03 DIAGNOSIS — G5690 Unspecified mononeuropathy of unspecified upper limb: Secondary | ICD-10-CM

## 2012-12-03 DIAGNOSIS — G609 Hereditary and idiopathic neuropathy, unspecified: Secondary | ICD-10-CM

## 2012-12-03 DIAGNOSIS — M109 Gout, unspecified: Secondary | ICD-10-CM

## 2012-12-03 DIAGNOSIS — M25521 Pain in right elbow: Secondary | ICD-10-CM

## 2012-12-03 DIAGNOSIS — E109 Type 1 diabetes mellitus without complications: Secondary | ICD-10-CM

## 2012-12-03 DIAGNOSIS — E1140 Type 2 diabetes mellitus with diabetic neuropathy, unspecified: Secondary | ICD-10-CM

## 2012-12-03 DIAGNOSIS — H538 Other visual disturbances: Secondary | ICD-10-CM

## 2012-12-03 DIAGNOSIS — E1149 Type 2 diabetes mellitus with other diabetic neurological complication: Secondary | ICD-10-CM

## 2012-12-03 DIAGNOSIS — G5793 Unspecified mononeuropathy of bilateral lower limbs: Secondary | ICD-10-CM

## 2012-12-03 DIAGNOSIS — B351 Tinea unguium: Secondary | ICD-10-CM

## 2012-12-03 MED ORDER — HYDROCODONE-ACETAMINOPHEN 10-325 MG PO TABS: 1.0000 | ORAL_TABLET | ORAL | Status: DC | PRN

## 2012-12-03 MED ORDER — GABAPENTIN 300 MG PO CAPS: 1200.0000 mg | ORAL_CAPSULE | Freq: Once | ORAL | Status: DC

## 2012-12-03 NOTE — Progress Notes (Signed)
  Subjective:    Patient ID: [name] Snow, male    DOB: May 25, 1965, 47 y.o.   MRN: [id number]  HPI 47 year old male presents for multiple reasons: 1. Need of medication refills. He states his diabetes has not been well on 50 units. Numbers have been ranging from 300-360. Taking the Lantus in the bottle, but copay has been doubled, so he would like to have an adjustment on his insulin. He started at 35, now at 8. Frequent urination has ceased since increasing to 50 units. He has discontinued the insulin x 2 weeks. He has not been able to sleep due to discontinuing the insulin 2 weeks ago. He states his vision is hazy and dry. He has excessive thirst. He also has noticed frontal tension headaches.  2. On his right elbow he has noticed a gout flare. He does not think it is a gout attack, and has been using allopurinol. 3. His neuropathy is bothering him, his phalanges, bilateral foot have been numb and warm to the touch. He states the numbness is intermittent. He has been taking gabapentin, but feels this should be increase.    Review of Systems     Objective:   Physical Exam        Assessment & Plan:

## 2012-12-03 NOTE — Progress Notes (Signed)
  Subjective:    Patient ID: [name] Snow, male    DOB: [birth year], 47 y.o.   MRN: [id number]  HPIhere for f/u Patient Active Problem List   Diagnosis Date Noted  . DM neuropathy, painful--- continues with intermittent pain and numbness but this also goes away at times /usually distressing at night /he was better with gabapentin but is now out  10/19/2011  . Hip pain, chronic 10/19/2011  . Onychomycosis--toenails are worse in general and he request referral to podiatry  10/19/2011  . Nephrolithiasis 10/19/2011  . Carpal tunnel syndrome 10/19/2011  . Obesity, Class III, BMI 40-49.9 (morbid obesity)-no weight loss since last visit  04/30/2011  . Diabetes mellitus--- at 50 units of Lantus his morning blood sugars were between 300- 400 and then he ran out of medication 2 weeks ago --- he now has blurred vision and nocturia again  04/30/2011  . 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  Continues with occasional numbness in the tips of his fingers as well as his diabetic neuropathy  Review of Systems No fever chills or night sweats No chest pain palpitations shortness of breath No urinary incontinence    Objective:   Physical Exam BP 140/76  Pulse 81  Temp(Src) 98 F (36.7 C) (Oral)  Resp 18  Ht 5\' 10"  (1.778 m)  Wt 303 lb (137.44 kg)  BMI 43.48 kg/m2  SpO2 97% HEENT clear Heart regular without murmur Extremities without edema Mild decreased sensation in the feet Onychomycosis extensive       Assessment & Plan:  Diabetes mellitus-uncontrolled  Restart Lantus at 75 units at bedtime or at supper and advanced by 5 units every week until  blood sugars in the morning are between 100- 130 Neuropathy of both feet  Resume gabapentin 1200 at bedtime  Neuropathy of hand, unspecified laterality  Gout-stable  Obesity,  Class III, BMI 40-49.9 (morbid obesity)--needs to lose!!!!  Onychomycosis - Plan: Ambulatory referral to Podiatry  Meds ordered this encounter  Medications  . gabapentin (NEURONTIN) 300 MG capsule    Sig: Take 4 capsules (1,200 mg total) by mouth once.    Dispense:  120 capsule    Refill:  2  . HYDROcodone-acetaminophen (NORCO) 10-325 MG per tablet    Sig: Take 1 tablet by mouth every 4 (four) hours as needed for pain.    Dispense:  180 tablet    Refill:  2   lantus 3vials--advance as directed A1C in 3 mos

## 2012-12-06 ENCOUNTER — Telehealth: Payer: Self-pay

## 2012-12-06 NOTE — Telephone Encounter (Signed)
Pts wife is calling to have pt's syringes (100 ct) called in to [facility] [place], also she would like to have a blood sugar monitor and strips called in as well. Best# [phone]

## 2012-12-08 ENCOUNTER — Other Ambulatory Visit: Payer: Self-pay | Admitting: *Deleted

## 2012-12-08 MED ORDER — "INSULIN SYRINGE 31G X 5/16"" 1 ML MISC": Status: DC

## 2012-12-08 MED ORDER — LANCETS MISC: Status: DC

## 2012-12-08 MED ORDER — BLOOD GLUC METER DISP-STRIPS DEVI: Status: DC

## 2012-12-08 MED ORDER — BLOOD GLUCOSE MONITOR KIT: PACK | Status: DC

## 2012-12-08 NOTE — Telephone Encounter (Signed)
Spoke with pt and notified that rxs are being sent in.

## 2012-12-08 NOTE — Telephone Encounter (Signed)
Pt's wife [name] Snow would now like to use [facility] on [address] instead of [facility] on [address].

## 2012-12-09 ENCOUNTER — Other Ambulatory Visit: Payer: Self-pay | Admitting: Radiology

## 2012-12-09 ENCOUNTER — Telehealth: Payer: Self-pay

## 2012-12-09 MED ORDER — BLOOD GLUCOSE MONITOR KIT: PACK | Status: DC

## 2012-12-09 NOTE — Telephone Encounter (Signed)
This was on the Rx.

## 2012-12-09 NOTE — Telephone Encounter (Signed)
[facility] at [phone]  Needs diagnosis code for ordering/filling script for meter/

## 2013-01-05 ENCOUNTER — Ambulatory Visit: Payer: MEDICARE | Admitting: Internal Medicine

## 2013-02-11 ENCOUNTER — Ambulatory Visit (INDEPENDENT_AMBULATORY_CARE_PROVIDER_SITE_OTHER): Payer: Medicare Other | Admitting: Internal Medicine

## 2013-02-11 ENCOUNTER — Other Ambulatory Visit: Payer: Self-pay | Admitting: Internal Medicine

## 2013-02-11 ENCOUNTER — Other Ambulatory Visit: Payer: Self-pay | Admitting: Radiology

## 2013-02-11 VITALS — BP 122/80 | HR 67 | Temp 98.3°F | Resp 18 | Ht 71.0 in | Wt 310.0 lb

## 2013-02-11 DIAGNOSIS — Z23 Encounter for immunization: Secondary | ICD-10-CM

## 2013-02-11 DIAGNOSIS — E1140 Type 2 diabetes mellitus with diabetic neuropathy, unspecified: Secondary | ICD-10-CM

## 2013-02-11 DIAGNOSIS — E119 Type 2 diabetes mellitus without complications: Secondary | ICD-10-CM

## 2013-02-11 DIAGNOSIS — M109 Gout, unspecified: Secondary | ICD-10-CM

## 2013-02-11 DIAGNOSIS — E1149 Type 2 diabetes mellitus with other diabetic neurological complication: Secondary | ICD-10-CM

## 2013-02-11 DIAGNOSIS — IMO0001 Reserved for inherently not codable concepts without codable children: Secondary | ICD-10-CM

## 2013-02-11 DIAGNOSIS — I10 Essential (primary) hypertension: Secondary | ICD-10-CM

## 2013-02-11 DIAGNOSIS — E66813 Obesity, class 3: Secondary | ICD-10-CM

## 2013-02-11 LAB — POCT URINALYSIS DIPSTICK
Bilirubin, UA: NEGATIVE
Glucose, UA: 500
Ketones, UA: NEGATIVE
Leukocytes, UA: NEGATIVE
Nitrite, UA: NEGATIVE
Protein, UA: NEGATIVE
Spec Grav, UA: 1.02
Urobilinogen, UA: 0.2
pH, UA: 5

## 2013-02-11 LAB — COMPREHENSIVE METABOLIC PANEL
ALT: 14 U/L (ref 0–53)
AST: 11 U/L (ref 0–37)
Albumin: 4 g/dL (ref 3.5–5.2)
Alkaline Phosphatase: 81 U/L (ref 39–117)
BUN: 24 mg/dL — ABNORMAL HIGH (ref 6–23)
CO2: 25 mEq/L (ref 19–32)
Calcium: 9.6 mg/dL (ref 8.4–10.5)
Chloride: 98 mEq/L (ref 96–112)
Creat: 1.07 mg/dL (ref 0.50–1.35)
Glucose, Bld: 331 mg/dL — ABNORMAL HIGH (ref 70–99)
Potassium: 4.8 mEq/L (ref 3.5–5.3)
Sodium: 134 mEq/L — ABNORMAL LOW (ref 135–145)
Total Bilirubin: 0.5 mg/dL (ref 0.3–1.2)
Total Protein: 7.4 g/dL (ref 6.0–8.3)

## 2013-02-11 LAB — GLUCOSE, POCT (MANUAL RESULT ENTRY): POC Glucose: 309 mg/dl — AB (ref 70–99)

## 2013-02-11 LAB — POCT GLYCOSYLATED HEMOGLOBIN (HGB A1C): Hemoglobin A1C: 12.9

## 2013-02-11 MED ORDER — HYDROCODONE-ACETAMINOPHEN 10-325 MG PO TABS: 1.0000 | ORAL_TABLET | ORAL | Status: DC | PRN

## 2013-02-11 MED ORDER — INSULIN ASPART PROT & ASPART (70-30 MIX) 100 UNIT/ML PEN: 50.0000 [IU] | PEN_INJECTOR | Freq: Two times a day (BID) | SUBCUTANEOUS | Status: DC

## 2013-02-11 MED ORDER — LISINOPRIL 10 MG PO TABS: 10.0000 mg | ORAL_TABLET | Freq: Every day | ORAL | Status: DC

## 2013-02-11 MED ORDER — GABAPENTIN 300 MG PO CAPS: 600.0000 mg | ORAL_CAPSULE | Freq: Three times a day (TID) | ORAL | Status: DC

## 2013-02-11 NOTE — Patient Instructions (Addendum)
If sugars are elevated increase insulin as directed: If sugar over 150 add 2 units to next day's dose If over 200 add 5 units If over 300 add 10 units

## 2013-02-11 NOTE — Progress Notes (Signed)
Subjective:    Patient ID: [name] Snow, male    DOB: November 09, 1965, 47 y.o.   MRN: [id number]  HPI  Bilateral ear fullness and pressure x several weeks.  Hx of ETD.  Not painful. No other URI sx's.  Lantus 100-120 units daily. Ran out last night. Too expensive - unable to afford.   Glucose 260-270 at home. Oral meds have been dc'ed.   Gabapentin not working well anymore - neuropathy seems to be worse at night.  Still taking hydrocodone for gout pain.  Has had several flares in the last 2 months. Anti-inflammatories do not work. Feels like as weight comes off and DM improves, gout worsens.   He is worried about hydrocodone changing to schedule 2.   Needs refills on insulin, lisinopril, gabapentin, hydrocodone.   Review of Systems  HENT: Negative for ear pain (bilateral ear fullness), congestion, rhinorrhea, postnasal drip, sinus pressure and ear discharge.   Musculoskeletal: Positive for arthralgias.  Neurological: Positive for numbness (diabetic neuropathy).       Objective:   Physical Exam BP 122/80  Pulse 67  Temp(Src) 98.3 F (36.8 C) (Oral)  Resp 18  Ht 5\' 11"  (1.803 m)  Wt 310 lb (140.615 kg)  BMI 43.26 kg/m2  SpO2 97% HEENT clear Heart regular without murmur Lungs clear No peripheral edema Pulses intact distally Slight decrease in subjective sensation to light touch over the volar aspect of the feet       Results for orders placed in visit on 02/11/13  COMPREHENSIVE METABOLIC PANEL      Result Value Range   Sodium 134 (*) 135 - 145 mEq/L   Potassium 4.8  3.5 - 5.3 mEq/L   Chloride 98  96 - 112 mEq/L   CO2 25  19 - 32 mEq/L   Glucose, Bld 331 (*) 70 - 99 mg/dL   BUN 24 (*) 6 - 23 mg/dL   Creat 4.09  8.11 - 9.14 mg/dL   Total Bilirubin 0.5  0.3 - 1.2 mg/dL   Alkaline Phosphatase 81  39 - 117 U/L   AST 11  0 - 37 U/L   ALT 14  0 - 53 U/L   Total Protein 7.4  6.0 - 8.3 g/dL   Albumin 4.0  3.5 - 5.2 g/dL   Calcium 9.6  8.4 - 78.2 mg/dL  POCT URINALYSIS  DIPSTICK      Result Value Range   Color, UA yellow     Clarity, UA clear     Glucose, UA 500     Bilirubin, UA neg     Ketones, UA neg     Spec Grav, UA 1.020     Blood, UA small     pH, UA 5.0     Protein, UA neg     Urobilinogen, UA 0.2     Nitrite, UA neg     Leukocytes, UA Negative    GLUCOSE, POCT (MANUAL RESULT ENTRY)      Result Value Range   POC Glucose 309 (*) 70 - 99 mg/dl  POCT GLYCOSYLATED HEMOGLOBIN (HGB A1C)      Result Value Range   Hemoglobin A1C 12.9      Assessment & Plan:   Type II or unspecified type diabetes mellitus without mention of complication, uncontrolled  DM neuropathy, painful - Plan: gabapentin (NEURONTIN) 300 MG capsule, HYDROcodone-acetaminophen (NORCO) 10-325 MG per tablet, DISCONTINUED: HYDROcodone-acetaminophen (NORCO) 10-325 MG per tablet, DISCONTINUED: HYDROcodone-acetaminophen (NORCO) 10-325 MG per tablet  Need for prophylactic  vaccination and inoculation against influenza - Plan: Flu Vaccine QUAD 36+ mos IM  HTN (hypertension) - Plan: lisinopril (PRINIVIL,ZESTRIL) 10 MG tablet  Gout  Obesity, Class III, BMI 40-49.9 (morbid obesity)  start Novolog 70/30 bid(humulin too expensive) Explained sliding scale: If sugars are elevated increase insulin as directed: If sugar over 150 add 2 units to next day's dose If over 200 add 5 units If over 300 add 10 units  Stop sugared beverages/weight loss imperative! Followup 2 months

## 2013-02-12 ENCOUNTER — Telehealth: Payer: Self-pay

## 2013-02-12 DIAGNOSIS — IMO0001 Reserved for inherently not codable concepts without codable children: Secondary | ICD-10-CM

## 2013-02-12 NOTE — Telephone Encounter (Signed)
Dr. Merla Riches, pt says that he thinks you may have called in the wrong insulin. He said the pharm said that insulin you prescribed was going to be $700. The pharm said you prescribed Nolovin and the pharm wonders if you meant Novolog. Please advise. Pt worried because he's been out of insulin since Thurs. Thanks

## 2013-02-13 MED ORDER — INSULIN LISPRO PROT & LISPRO (75-25 MIX) 100 UNIT/ML ~~LOC~~ SUSP: 50.0000 [IU] | Freq: Two times a day (BID) | SUBCUTANEOUS | Status: DC

## 2013-02-13 MED ORDER — INSULIN ASPART PROT & ASPART (70-30 MIX) 100 UNIT/ML ~~LOC~~ SUSP: 50.0000 [IU] | Freq: Two times a day (BID) | SUBCUTANEOUS | Status: DC

## 2013-02-13 NOTE — Telephone Encounter (Signed)
Ins too expens--chN to novolog 70/30

## 2013-02-14 ENCOUNTER — Telehealth: Payer: Self-pay

## 2013-02-14 NOTE — Telephone Encounter (Signed)
Pt states that he has called several times about getting his insulin changed everytime he states it like 700$ at the pharmacy that it is suppose to be a generic kind of insulin Call back number is [phone]

## 2013-02-14 NOTE — Telephone Encounter (Signed)
Can we fix this for pt? Please advise

## 2013-02-15 MED ORDER — INSULIN NPH ISOPHANE & REGULAR (70-30) 100 UNIT/ML ~~LOC~~ SUSP: 50.0000 [IU] | Freq: Two times a day (BID) | SUBCUTANEOUS | Status: DC

## 2013-02-15 NOTE — Telephone Encounter (Signed)
See notes under 02/14/13 phone message.

## 2013-02-15 NOTE — Telephone Encounter (Signed)
He was changed from Lantus to Novolog - should be cheaper. Is it still expensive? He can try OTC which also may be less expensive.

## 2013-02-15 NOTE — Telephone Encounter (Signed)
I received a PA request for Novolog Rx. Called pharmacy to get more info about what is needed to get pt an insulin he can afford. Pharmacist stated that pt is approaching end of Rx ins coverage for the year d/t the high cost of the meds he has been using, which makes that Lantus and Novolog both too expensive. If the pt can be treated with Novolin he can get the [facility] brand for about $22 a vial until the first of the year when he should have coverage again for Lantus.

## 2013-02-15 NOTE — Telephone Encounter (Signed)
Called him no answer.

## 2013-02-15 NOTE — Telephone Encounter (Signed)
Rx sent to pharmacy: Meds ordered this encounter  Medications  . insulin NPH-regular (NOVOLIN 70/30) (70-30) 100 UNIT/ML injection    Sig: Inject 50 Units into the skin 2 (two) times daily with a meal.    Dispense:  30 mL    Refill:  12    Order Specific Question:  Supervising Provider    Answer:  Merla Riches, ROBERT P [[id number]]

## 2013-02-16 NOTE — Progress Notes (Signed)
Appt made for 12/17 at 2:15.

## 2013-04-30 ENCOUNTER — Other Ambulatory Visit: Payer: Self-pay | Admitting: Internal Medicine

## 2013-05-04 ENCOUNTER — Encounter: Payer: Self-pay | Admitting: Internal Medicine

## 2013-05-04 ENCOUNTER — Ambulatory Visit (INDEPENDENT_AMBULATORY_CARE_PROVIDER_SITE_OTHER): Payer: Medicare Other | Admitting: Internal Medicine

## 2013-05-04 VITALS — BP 146/86 | HR 62 | Temp 98.1°F | Resp 16 | Ht 70.0 in | Wt 301.4 lb

## 2013-05-04 DIAGNOSIS — E66813 Obesity, class 3: Secondary | ICD-10-CM

## 2013-05-04 DIAGNOSIS — IMO0001 Reserved for inherently not codable concepts without codable children: Secondary | ICD-10-CM

## 2013-05-04 DIAGNOSIS — M109 Gout, unspecified: Secondary | ICD-10-CM

## 2013-05-04 DIAGNOSIS — R109 Unspecified abdominal pain: Secondary | ICD-10-CM

## 2013-05-04 DIAGNOSIS — E1140 Type 2 diabetes mellitus with diabetic neuropathy, unspecified: Secondary | ICD-10-CM

## 2013-05-04 DIAGNOSIS — E1059 Type 1 diabetes mellitus with other circulatory complications: Secondary | ICD-10-CM

## 2013-05-04 DIAGNOSIS — R10A0 Flank pain, unspecified side: Secondary | ICD-10-CM

## 2013-05-04 LAB — POCT UA - MICROSCOPIC ONLY
Casts, Ur, LPF, POC: NEGATIVE
Crystals, Ur, HPF, POC: NEGATIVE

## 2013-05-04 LAB — POCT URINALYSIS DIPSTICK
Blood, UA: NEGATIVE
Protein, UA: NEGATIVE
Spec Grav, UA: 1.02
Urobilinogen, UA: 0.2
pH, UA: 5

## 2013-05-04 LAB — GLUCOSE, POCT (MANUAL RESULT ENTRY): POC Glucose: 336 mg/dl — AB (ref 70–99)

## 2013-05-04 MED ORDER — CYCLOBENZAPRINE HCL 10 MG PO TABS: 10.0000 mg | ORAL_TABLET | Freq: Three times a day (TID) | ORAL | Status: DC | PRN

## 2013-05-04 MED ORDER — GABAPENTIN 300 MG PO CAPS: ORAL_CAPSULE | ORAL | Status: DC

## 2013-05-04 MED ORDER — INSULIN NPH ISOPHANE & REGULAR (70-30) 100 UNIT/ML ~~LOC~~ SUSP: 55.0000 [IU] | Freq: Two times a day (BID) | SUBCUTANEOUS | Status: DC

## 2013-05-04 MED ORDER — HYDROCODONE-ACETAMINOPHEN 10-325 MG PO TABS: 1.0000 | ORAL_TABLET | ORAL | Status: DC | PRN

## 2013-05-04 MED ORDER — BLOOD GLUC METER DISP-STRIPS DEVI: 1.0000 | Freq: Two times a day (BID) | Status: DC

## 2013-05-04 NOTE — Progress Notes (Signed)
Subjective:    Patient ID: [name] Snow, male    DOB: Mar 23, 1966, 47 y.o.   MRN: [id number]  HPI 3 week history of lower back pain, testicular pain, dysuria, urine with odor. Some hematuria.Has history of kidney stones and hematuria.  Currently taking 50-55 units q am and pm of Novolin (otc). Did not have any insulin for about a month. Has been using for 1 month.  Has not had a gout flare recently. 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 Has required more hydrocodone. His mother has been sick and he has had more stress having to take care of her. She is paranoid and has been unable to live successfully in several housing situations and is now living with him. Discipline issues with his son, who is over 200 lbs still in elem sch//remember father and brother both were crack addicts  Back pain is manageable with current norco use. Did have to use a few extra.  Review of Systems  Constitutional: Negative for fever and unexpected weight change.       Poor eating habitstill stopping soft drinks but not off corn syrup beverages yet  Respiratory: Negative for chest tightness and shortness of breath.   Cardiovascular: Negative for chest pain, palpitations and leg swelling.  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       Objective:   Physical Exam  Nursing note and vitals reviewed. Constitutional: He is oriented to person, place, and time. He appears well-developed and well-nourished. No distress.  Neck: Normal range of motion. Neck supple.  Cardiovascular: Normal rate and regular rhythm.   Pulmonary/Chest: Effort normal.  Musculoskeletal: He exhibits no edema.  Walks with limp. Seated straight leg raise does not produce pain.  Neurological: He is alert and oriented to person, place, and time.  Sl decr  sens to light touch plantar toes  Skin: Skin is warm and dry. He is not diaphoretic.  Bilateral feet with thickened, yellow toenails. Thickened skin on soles. No breakdown on feet.  Psychiatric: He has a normal mood and affect. His behavior is normal. Judgment and thought content normal.       Assessment & Plan:  Diabetes mellitus with peripheral circulatory disorders, not stated as uncontrolled(250.71) - Plan: POCT glucose (manual entry), POCT glycosylated hemoglobin (Hb A1C), HM Diabetes Foot Exam  Flank pain - Plan: POCT urinalysis dipstick, POCT UA - Microscopic Only  Type II or unspecified type diabetes mellitus without mention of complication, uncontrolled - Plan: Blood Gluc Meter Disp-Strips (BLOOD GLUCOSE METER DISPOSABLE) DEVI  DM neuropathy, painful - Plan: HYDROcodone-acetaminophen (NORCO) 10-325 MG per tablet, gabapentin (NEURONTIN) 300 MG capsule  Instructed patient to increase Novolin 70/30 by 5 units every time blood sugar reading over 250. Asked him to keep a log which was provided to him. Encouraged him to walk 10-15 minutes per day and note exercise in log.   Follow up in 3-4 weeks.  Meds ordered this encounter  Medications  . Blood Gluc Meter Disp-Strips (BLOOD GLUCOSE METER DISPOSABLE) DEVI    Sig: 1 strip by Percutaneous route 2 (two) times daily before a meal. Test blood sugar once a day as directed. Dx Code: 250.00    Dispense:  180 each    Refill:  3    Please dispense whichever strips  that goes with the monitor of patients choice.  Marland Kitchen HYDROcodone-acetaminophen (NORCO) 10-325 MG per tablet    Sig: Take 1 tablet by mouth every 4 (four) hours as needed. For 05/04/13    Dispense:  180 tablet    Refill:  0  . gabapentin (NEURONTIN) 300 MG capsule    Sig: One am, one midday, and 2 hs    Dispense:  120 capsule    Refill:  11    Needs office visit  . insulin NPH-regular (NOVOLIN 70/30) (70-30) 100 UNIT/ML injection    Sig: Inject 55 Units into the skin 2 (two)  times daily with a meal. Dose will be escalating    Dispense:  30 mL    Refill:  12    Order Specific Question:  Supervising Provider    Answer:  Chiquita Heckert P [[id number]]  . cyclobenzaprine (FLEXERIL) 10 MG tablet    Sig: Take 1 tablet (10 mg total) by mouth 3 (three) times daily as needed for muscle spasms.    Dispense:  60 tablet    Refill:  3     I have completed the patient encounter in its entirety as documented by FNP Leone Payor, with editing by me where necessary. Its a sad story of this talented patient, varied skills but trapped in upbringing and saddled with illnesses that have made success difficult. Hansika Leaming P. Merla Riches, M.D.

## 2013-05-04 NOTE — Patient Instructions (Addendum)
Check your blood sugar before meals twice a day. If blood sugar greater than 250, increase insulin by 5 units both at breakfast and supper. Please keep a log of your readings and bring in to your next visit.

## 2013-05-08 NOTE — Addendum Note (Signed)
Addended by: Tonye Pearson on: 05/08/2013 11:15 AM   Modules accepted: Orders

## 2013-05-13 ENCOUNTER — Telehealth: Payer: Self-pay | Admitting: Radiology

## 2013-05-13 NOTE — Telephone Encounter (Signed)
Tried to call patient about his appt with Dr Micheal Likens, it is scheduled for 05/24/13 unable to reach, letter sent.

## 2013-05-25 ENCOUNTER — Ambulatory Visit: Payer: Medicare Other | Admitting: Internal Medicine

## 2013-06-01 ENCOUNTER — Ambulatory Visit (INDEPENDENT_AMBULATORY_CARE_PROVIDER_SITE_OTHER): Payer: Medicare Other | Admitting: Family Medicine

## 2013-06-01 VITALS — BP 144/78 | HR 93 | Temp 99.0°F | Resp 20 | Ht 70.0 in | Wt 299.8 lb

## 2013-06-01 DIAGNOSIS — E1140 Type 2 diabetes mellitus with diabetic neuropathy, unspecified: Secondary | ICD-10-CM

## 2013-06-01 DIAGNOSIS — E119 Type 2 diabetes mellitus without complications: Secondary | ICD-10-CM

## 2013-06-01 DIAGNOSIS — G894 Chronic pain syndrome: Secondary | ICD-10-CM

## 2013-06-01 DIAGNOSIS — I10 Essential (primary) hypertension: Secondary | ICD-10-CM

## 2013-06-01 DIAGNOSIS — E1149 Type 2 diabetes mellitus with other diabetic neurological complication: Secondary | ICD-10-CM

## 2013-06-01 MED ORDER — GABAPENTIN 300 MG PO CAPS: ORAL_CAPSULE | ORAL | Status: DC

## 2013-06-01 MED ORDER — HYDROCODONE-ACETAMINOPHEN 10-325 MG PO TABS: 1.0000 | ORAL_TABLET | Freq: Two times a day (BID) | ORAL | Status: DC | PRN

## 2013-06-01 MED ORDER — LISINOPRIL 10 MG PO TABS: 10.0000 mg | ORAL_TABLET | Freq: Every day | ORAL | Status: DC

## 2013-06-01 NOTE — Progress Notes (Signed)
   Subjective:    Patient ID: [name] Snow, male    DOB: 1965/09/28, 48 y.o.   MRN: [id number]  HPI Patient presents for medication refills. His PCP is Dr. Merla Richesoolittle. Patient states that he had an appointment here for last week but missed that appointment. Was having a lot of back pain and unable to get out of bed at the time. States that he was confused about the time of the appointment and unfortunately missed that appointment.   Patient states that he has been having trouble with his sugars. Sugars are still running high. Did not bring sugar log. Sugars in the morning fasting are 340-370.  Afternoon sugars similar.  Currently on 70/30 and is using 55 units bid with breakfast and dinner. Last night took 80 units of 70/30 insulin and BG = 237 this am fasting.  Has been trying to cut back on sugar and carbs.   BP also high today. Usually runs about 130 at home, but is having a lot of stress at home. Patient admits he is not taking his lisinopril 10 mg daily because he is giving it to who mother who is refusing to go to the doctor.   Review of Systems  All other systems reviewed and are negative.       Objective:   Physical Exam  Constitutional: He is oriented to person, place, and time. He appears well-developed and well-nourished. No distress.  HENT:  Head: Normocephalic.  Eyes: No scleral icterus.  Cardiovascular: Normal rate and regular rhythm.   No murmur heard. Pulmonary/Chest: Effort normal and breath sounds normal.  Musculoskeletal: Normal range of motion.  Neurological: He is alert and oriented to person, place, and time.  Skin: Skin is warm and dry.  Psychiatric: He has a normal mood and affect. His behavior is normal.      Assessment & Plan:  #1. Diabetes, uncontrolled - increase 70/30 to 65 units bid  - okay to increase by 5 units every 4 days for persistent sugar > 200 - Refill lisinopril  #2. Neuropathy - Refill gabapentin  #3. Chronic pain - Reviewed  chronic pain policy with patient - Will give 20 tablets for bid use until Dr. Merla Richesoolittle returns - Explained that no further refills will be given until Dr. Merla Richesoolittle returns  [facility] Policy for Prescribing Controlled Substances (Revised 03/2012) 1. Prescriptions for controlled substances will be filled by ONE provider at [facility] with whom you have established and developed a plan for your care, including follow-up. 2. You are encouraged to schedule an appointment with your prescriber at our appointment center for follow-up visits whenever possible. 3. If you request a prescription for the controlled substance while at [facility] for an acute problem (with someone other than your regular prescriber), you MAY be given a ONE-TIME prescription for a 30-day supply of the controlled substance, to allow time for you to return to see your regular prescriber for additional prescriptions.

## 2013-06-01 NOTE — Patient Instructions (Signed)
Increase 70/30 to 65 units 2x per day. Okay to increase by 5 units every 4 days for persistent sugar > 200  Your hydrocodone was refilled only for 2 tablets per day. This will not further refilled prior to Dr. Netta Corriganoolittle's return. Follow January 23rd.

## 2013-06-03 NOTE — Progress Notes (Signed)
Note reviewed, and agree with documentation and plan.  

## 2013-07-09 ENCOUNTER — Ambulatory Visit (INDEPENDENT_AMBULATORY_CARE_PROVIDER_SITE_OTHER): Payer: Medicare Other | Admitting: Internal Medicine

## 2013-07-09 VITALS — BP 146/82 | HR 77 | Temp 98.2°F | Resp 16 | Ht 70.5 in | Wt 293.0 lb

## 2013-07-09 DIAGNOSIS — E1165 Type 2 diabetes mellitus with hyperglycemia: Secondary | ICD-10-CM

## 2013-07-09 DIAGNOSIS — E1140 Type 2 diabetes mellitus with diabetic neuropathy, unspecified: Secondary | ICD-10-CM

## 2013-07-09 DIAGNOSIS — Z1211 Encounter for screening for malignant neoplasm of colon: Secondary | ICD-10-CM

## 2013-07-09 DIAGNOSIS — E1149 Type 2 diabetes mellitus with other diabetic neurological complication: Secondary | ICD-10-CM

## 2013-07-09 DIAGNOSIS — N509 Disorder of male genital organs, unspecified: Secondary | ICD-10-CM

## 2013-07-09 DIAGNOSIS — IMO0001 Reserved for inherently not codable concepts without codable children: Secondary | ICD-10-CM

## 2013-07-09 DIAGNOSIS — N50819 Testicular pain, unspecified: Secondary | ICD-10-CM

## 2013-07-09 DIAGNOSIS — M109 Gout, unspecified: Secondary | ICD-10-CM

## 2013-07-09 DIAGNOSIS — R634 Abnormal weight loss: Secondary | ICD-10-CM

## 2013-07-09 DIAGNOSIS — R319 Hematuria, unspecified: Secondary | ICD-10-CM

## 2013-07-09 DIAGNOSIS — E66813 Obesity, class 3: Secondary | ICD-10-CM

## 2013-07-09 LAB — POCT URINALYSIS DIPSTICK
BILIRUBIN UA: NEGATIVE
Glucose, UA: 500
Ketones, UA: NEGATIVE
LEUKOCYTES UA: NEGATIVE
NITRITE UA: NEGATIVE
PH UA: 5
Protein, UA: NEGATIVE
Spec Grav, UA: 1.015
Urobilinogen, UA: 0.2

## 2013-07-09 LAB — COMPREHENSIVE METABOLIC PANEL
ALK PHOS: 81 U/L (ref 39–117)
ALT: 15 U/L (ref 0–53)
AST: 11 U/L (ref 0–37)
Albumin: 4.5 g/dL (ref 3.5–5.2)
BILIRUBIN TOTAL: 0.5 mg/dL (ref 0.2–1.2)
BUN: 25 mg/dL — ABNORMAL HIGH (ref 6–23)
CO2: 28 meq/L (ref 19–32)
Calcium: 9.6 mg/dL (ref 8.4–10.5)
Chloride: 97 mEq/L (ref 96–112)
Creat: 1.21 mg/dL (ref 0.50–1.35)
GLUCOSE: 331 mg/dL — AB (ref 70–99)
POTASSIUM: 5.1 meq/L (ref 3.5–5.3)
SODIUM: 135 meq/L (ref 135–145)
TOTAL PROTEIN: 8.2 g/dL (ref 6.0–8.3)

## 2013-07-09 LAB — POCT CBC
GRANULOCYTE PERCENT: 59.9 % (ref 37–80)
HEMATOCRIT: 44 % (ref 43.5–53.7)
Hemoglobin: 13.7 g/dL — AB (ref 14.1–18.1)
Lymph, poc: 3.2 (ref 0.6–3.4)
MCH, POC: 28.3 pg (ref 27–31.2)
MCHC: 31.1 g/dL — AB (ref 31.8–35.4)
MCV: 91 fL (ref 80–97)
MID (cbc): 0.6 (ref 0–0.9)
MPV: 11.8 fL (ref 0–99.8)
POC GRANULOCYTE: 5.6 (ref 2–6.9)
POC LYMPH PERCENT: 34.2 %L (ref 10–50)
POC MID %: 5.9 %M (ref 0–12)
Platelet Count, POC: 209 10*3/uL (ref 142–424)
RBC: 4.84 M/uL (ref 4.69–6.13)
RDW, POC: 14.1 %
WBC: 9.4 10*3/uL (ref 4.6–10.2)

## 2013-07-09 LAB — POCT UA - MICROSCOPIC ONLY
BACTERIA, U MICROSCOPIC: NEGATIVE
Casts, Ur, LPF, POC: NEGATIVE
Crystals, Ur, HPF, POC: NEGATIVE
MUCUS UA: NEGATIVE
Yeast, UA: NEGATIVE

## 2013-07-09 LAB — POCT GLYCOSYLATED HEMOGLOBIN (HGB A1C): HEMOGLOBIN A1C: 11.2

## 2013-07-09 LAB — URIC ACID: Uric Acid, Serum: 6.7 mg/dL (ref 4.0–7.8)

## 2013-07-09 LAB — POCT SEDIMENTATION RATE: POCT SED RATE: 40 mm/hr — AB (ref 0–22)

## 2013-07-09 LAB — TSH: TSH: 1.628 u[IU]/mL (ref 0.350–4.500)

## 2013-07-09 MED ORDER — GABAPENTIN 300 MG PO CAPS: ORAL_CAPSULE | ORAL | Status: DC

## 2013-07-09 MED ORDER — BLOOD GLUC METER DISP-STRIPS DEVI: 1.0000 | Freq: Two times a day (BID) | Status: DC

## 2013-07-09 MED ORDER — HYDROCODONE-ACETAMINOPHEN 10-325 MG PO TABS: 1.0000 | ORAL_TABLET | ORAL | Status: DC | PRN

## 2013-07-09 MED ORDER — "INSULIN SYRINGE 31G X 5/16"" 1 ML MISC": Status: DC

## 2013-07-09 MED ORDER — LANCETS MISC: Status: DC

## 2013-07-09 NOTE — Patient Instructions (Signed)
If Morning blood sugar is above 150 3 days in a row, then increase insulin at suppertime 3-5 units If suppertime bloodsugar is above 150 3 days in a row, then lncrease morning insulin 3-5 units Recheck 4-5 weeks

## 2013-07-09 NOTE — Progress Notes (Signed)
Subjective:    Patient ID: [name] Snow, male    DOB: 1966/01/23, 48 y.o.   MRN: [id number]  HPI Patient Active Problem List   Diagnosis Date Noted  . Type II or unspecified type diabetes mellitus without mention of complication, uncontrolled-----he has been using his insulin on a regular basis recently as he has run out of needles  05/04/2013  . DM neuropathy, painful---feet and not hands--worse when up often-better in am//interferes with sleep----oddly this gets better at night ////his use of Norco is intermittent for this pain and mainly to allow sleep and activity in the evening  10/19/2011  . 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 Onychomycosis 10/19/2011  . Nephrolithiasis-none recent that he is aware of 10/19/2011  . Carpal tunnel syndrome 10/19/2011  . Obesity, Class III, BMI 40-49.9 (morbid obesity)--- he has continued to lose weight although it is unintended at this point and he is worried that something is wrong  04/30/2011  . Gout-no symptoms for many months /his gout seems to worsen as his sugars are better controlled  04/30/2011  . 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 gabapentin (NEURONTIN) 300 MG capsule, One am, one midday, and 2 hs, Disp: 120 capsule, Rfl: 0;  HYDROcodone-acetaminophen (NORCO) 10-325 MG per tablet, Take 1 tablet by mouth 2 (two) times daily as needed. For 05/04/13, Disp: 20 tablet, Rfl: 0 insulin NPH-regular (NOVOLIN 70/30) (70-30) 100 UNIT/ML injection, Inject 55 Units into the skin 2 (two) times daily with a meal. Dose will be escalating, Disp: 30 mL, Rfl: 12;  Insulin Syringe-Needle U-100 (INSULIN SYRINGE 1CC/31GX5/16") 31G X 5/16" 1 ML MISC, Use as directed once a day. Dx Code: 250.00, Disp: 100 each, Rfl: 1;  Lancets MISC, Use as directed once day. Dx Code: 250.00, Disp: 100 each, Rfl: 2 lisinopril (PRINIVIL,ZESTRIL) 10 MG tablet, Take 1 tablet (10 mg total) by mouth daily., Disp: 90 tablet, Rfl: 0   Good relat w/ son 84 and wife Review of Systems  Constitutional: Negative for appetite change and fatigue.  HENT: Negative for hearing loss.   Eyes: Negative for visual disturbance.  Respiratory: Negative for chest tightness and shortness of breath.   Cardiovascular: Negative for chest pain, palpitations and leg swelling.  Gastrointestinal:       He has a fair amount of constipation alternating with loose stools over the past 6 months/he cannot tell whether this is related to his use of Norco but has had constipation from opiates in the past  Genitourinary: Positive for testicular pain. Negative for frequency, hematuria, discharge and  scrotal swelling.  Neurological: Negative for light-headedness and headaches.       Objective:   Physical Exam BP 146/82  Pulse 77  Temp(Src) 98.2 F (36.8 C) (Oral)  Resp 16  Ht 5' 10.5" (1.791 m)  Wt 293 lb (132.904 kg)  BMI 41.43 kg/m2  SpO2 97% HEENT clear without thyromegaly or lymphadenopathy Heart regular without murmur Lungs clear Abdomen is morbidly obese but no organomegaly or masses Mild tenderness in the left lower quadrant without mass Left testicle has no swelling but  is mildly tender to palpation/no discrete nodular changes Extremities with good peripheral pulses and no edema Decreased sensation to light touch over the toes bilaterally No definite changes neurologically in the hands Neck has good range of motion without radicular symptoms Straight leg raise to 90 is negative bilaterally Hip range of motion is fair without pain on external rotation bilaterally       Results for orders placed in visit on 07/09/13  POCT UA - MICROSCOPIC ONLY      Result Value Ref Range   WBC, Ur, HPF, POC 0-2     RBC, urine, microscopic 2-6     Bacteria, U Microscopic neg     Mucus, UA neg     Epithelial cells, urine per micros 0-3     Crystals, Ur, HPF, POC neg     Casts, Ur, LPF, POC neg     Yeast, UA neg    POCT URINALYSIS DIPSTICK      Result Value Ref Range   Color, UA yellow     Clarity, UA clear     Glucose, UA 500     Bilirubin, UA neg     Ketones, UA neg     Spec Grav, UA 1.015     Blood, UA trace     pH, UA 5.0     Protein, UA neg     Urobilinogen, UA 0.2     Nitrite, UA neg     Leukocytes, UA Negative    POCT CBC      Result Value Ref Range   WBC 9.4  4.6 - 10.2 K/uL   Lymph, poc 3.2  0.6 - 3.4   POC LYMPH PERCENT 34.2  10 - 50 %L   MID (cbc) 0.6  0 - 0.9   POC MID % 5.9  0 - 12 %M   POC Granulocyte 5.6  2 - 6.9   Granulocyte percent 59.9  37 - 80 %G   RBC 4.84  4.69 - 6.13 M/uL   Hemoglobin 13.7 (*) 14.1 - 18.1 g/dL   HCT, POC 16.1  09.6 - 53.7 %   MCV 91.0  80 - 97 fL   MCH, POC 28.3  27 - 31.2 pg   MCHC 31.1 (*) 31.8 - 35.4 g/dL   RDW, POC 04.5     Platelet Count, POC 209  142 - 424 K/uL   MPV 11.8  0 - 99.8 fL  POCT GLYCOSYLATED HEMOGLOBIN (HGB A1C)      Result Value Ref Range   Hemoglobin A1C 11.2      Assessment & Plan:  Loss of weight - Plan: POCT SEDIMENTATION RATE, Comprehensive metabolic panel, TSH, CT Abdomen Pelvis W Contrast  Pain in testicle - Plan: POCT UA - Microscopic Only, POCT urinalysis dipstick, PSA, CT  Abdomen Pelvis W Contrast  Type II or unspecified type diabetes mellitus without mention of complication, uncontrolled - Plan: POCT CBC, POCT glycosylated hemoglobin (Hb A1C), Blood Gluc  Meter Disp-Strips (BLOOD GLUCOSE METER DISPOSABLE) DEVI  DM neuropathy, painful - Plan: gabapentin (NEURONTIN) 300 MG capsule, HYDROcodone-acetaminophen (NORCO) 10-325 MG per tablet///set peripheral nerve conduction studies  Obesity, Class III, BMI 40-49.9 (morbid obesity)  Gout - Plan: Uric Acid  Hematuria - Plan: CT Abdomen Pelvis W Contrast  Sleep apnea probable-untreated by choice/problems with mask  Patient Instructions--his recent blood sugars have all been above 250 when he using 80 units of insulin morning and evening --we will plan increase =  If Morning blood sugar is above 150 3 days in a row, then increase insulin at suppertime 3-5 units If suppertime bloodsugar is above 150 3 days in a row, then lncrease morning insulin 3-5 units Recheck 4-5 weeks If uncontrolled will obtain endocrine consult for further management  We'll refer for colonoscopy due to change in bowel movements  Meds ordered this encounter  Medications  . Lancets MISC    Sig: Use as directed once day. Dx Code: 250.00    Dispense:  100 each    Refill:  2    Please dispense whichever lancets that goes with the monitor of patients choice.  . Blood Gluc Meter Disp-Strips (BLOOD GLUCOSE METER DISPOSABLE) DEVI    Sig: 1 strip by Percutaneous route 2 (two) times daily before a meal. Test blood sugar once a day as directed. Dx Code: 250.00    Dispense:  180 each    Refill:  3    Please dispense whichever strips that goes with the monitor of patients choice.  . Insulin Syringe-Needle U-100 (INSULIN SYRINGE 1CC/31GX5/16") 31G X 5/16" 1 ML MISC    Sig: Use as directed once a day. Dx Code: 250.00    Dispense:  100 each    Refill:  1  . gabapentin (NEURONTIN) 300 MG capsule    Sig: One am, one midday, and 2 hs    Dispense:  120  capsule    Refill:  11  . HYDROcodone-acetaminophen (NORCO) 10-325 MG per tablet    Sig: Take 1 tablet by mouth every 4 (four) hours as needed. For 05/04/13    Dispense:  180 tablet    Refill:  0

## 2013-07-10 LAB — PSA: PSA: 0.09 ng/mL (ref <=4.00)

## 2013-07-11 ENCOUNTER — Telehealth: Payer: Self-pay | Admitting: *Deleted

## 2013-07-11 NOTE — Telephone Encounter (Signed)
Call [facility]--I need to know the type of hip prosthesis dr Charlann Boxerolin used in him 2012  From Dr. Merla Richesoolittle please send back to him

## 2013-07-12 ENCOUNTER — Encounter: Payer: Self-pay | Admitting: Internal Medicine

## 2013-07-18 ENCOUNTER — Other Ambulatory Visit: Payer: Self-pay

## 2013-07-22 ENCOUNTER — Ambulatory Visit
Admission: RE | Admit: 2013-07-22 | Discharge: 2013-07-22 | Disposition: A | Discharge status: Home / Self Care | Payer: Medicare Other | Source: Ambulatory Visit | Attending: Internal Medicine | Admitting: Internal Medicine

## 2013-07-22 DIAGNOSIS — N50819 Testicular pain, unspecified: Secondary | ICD-10-CM

## 2013-07-22 DIAGNOSIS — R634 Abnormal weight loss: Secondary | ICD-10-CM

## 2013-07-22 DIAGNOSIS — R319 Hematuria, unspecified: Secondary | ICD-10-CM

## 2013-07-22 MED ORDER — IOHEXOL 300 MG/ML  SOLN
125.0000 mL | Freq: Once | INTRAMUSCULAR | Status: AC | PRN
  Administered 2013-07-22: 125 mL via INTRAVENOUS

## 2013-07-25 ENCOUNTER — Encounter: Payer: Self-pay | Admitting: Internal Medicine

## 2013-07-27 NOTE — Telephone Encounter (Signed)
Called [facility], left message for dr olin's nurse to please call back with this information.

## 2013-08-08 ENCOUNTER — Ambulatory Visit (INDEPENDENT_AMBULATORY_CARE_PROVIDER_SITE_OTHER): Payer: Medicare Other | Admitting: Internal Medicine

## 2013-08-08 VITALS — BP 158/80 | HR 70 | Temp 99.0°F | Resp 18 | Ht 73.25 in | Wt 296.4 lb

## 2013-08-08 DIAGNOSIS — R202 Paresthesia of skin: Secondary | ICD-10-CM

## 2013-08-08 DIAGNOSIS — N200 Calculus of kidney: Secondary | ICD-10-CM

## 2013-08-08 DIAGNOSIS — R160 Hepatomegaly, not elsewhere classified: Secondary | ICD-10-CM

## 2013-08-08 DIAGNOSIS — E119 Type 2 diabetes mellitus without complications: Secondary | ICD-10-CM

## 2013-08-08 DIAGNOSIS — M109 Gout, unspecified: Secondary | ICD-10-CM

## 2013-08-08 DIAGNOSIS — G5600 Carpal tunnel syndrome, unspecified upper limb: Secondary | ICD-10-CM

## 2013-08-08 DIAGNOSIS — I10 Essential (primary) hypertension: Secondary | ICD-10-CM

## 2013-08-08 DIAGNOSIS — M79601 Pain in right arm: Secondary | ICD-10-CM

## 2013-08-08 DIAGNOSIS — M79602 Pain in left arm: Secondary | ICD-10-CM

## 2013-08-08 DIAGNOSIS — E1140 Type 2 diabetes mellitus with diabetic neuropathy, unspecified: Secondary | ICD-10-CM

## 2013-08-08 DIAGNOSIS — E1149 Type 2 diabetes mellitus with other diabetic neurological complication: Secondary | ICD-10-CM

## 2013-08-08 MED ORDER — HYDROCODONE-ACETAMINOPHEN 10-325 MG PO TABS: 1.0000 | ORAL_TABLET | ORAL | Status: DC | PRN

## 2013-08-08 MED ORDER — LISINOPRIL 10 MG PO TABS: 10.0000 mg | ORAL_TABLET | Freq: Every day | ORAL | Status: DC

## 2013-08-08 NOTE — Patient Instructions (Signed)
Patient Instructions--his recent blood sugars have all been above 250 when he using 80 units of insulin morning and evening --we will plan increase =   If Morning blood sugar is above 150 3 days in a row, then increase insulin at suppertime 3-5 units  If suppertime bloodsugar is above 150 3 days in a row, then lncrease morning insulin 3-5 units  Recheck 4-5 weeks  If uncontrolled will obtain endocrine consult for further management

## 2013-08-08 NOTE — Progress Notes (Signed)
Subjective:    Patient ID: [name] Snow, male    DOB: January 24, 1966, 48 y.o.   MRN: [id number] This chart was scribed for Ellamae Siaobert Doolittle, MD by Nicholos Johnsenise Iheanachor, Medical Scribe. This patient's care was started at 3:54 PM.  HPI  HPI Comments: [name] Snow is a 48 y.o. male w/ family history heart disease  presents to the [facility] for a refill of hydrocodone. States he has been in a financial bind over the past month and so he hasn't started any of the insulin as directed.  States the gastroenterologist from [facility] called him but he missed the phone call and did not have the number to call them back. Looked at the MRI report which states they found an enlarged liver, left renal stone, possible atherosclerosis in the abdominal vessels. Report also noted some changes along lumbar area called degenerative disc disease  States weight loss had been good up until recently and has now come to a stand still. Is looking for new ways to lose weight. Has cut back on s soft drinks severely.  Also reports constant weakness and tingling up bilateral arms; right worse than left. Says he is unable to make a full fist with the right hand due to swelling. This has been present for many months States pain started at the tips of the fingers and has since moved into the thumb and back side of the hands. Also reports similar symptoms in the feet. Has been tested for Carpel Tunnel Syndrome with unclear findings In the past.  Says he has missed an eye appointment with Dr. Nile RiggsShapiro and has not had the chance to come back in or reschedule a new one. Does report eye sight when reading has improved. Would still like to reschedule appointment.  Review of Systems No headaches or vision loss No chest pain or palpitations No increase in shortness of breath or dyspnea on exertion although he is relatively inactive No kidney stone symptoms No GI upset or pain  Objective:  Physical Exam  Vitals  reviewed. Constitutional: He is oriented to person, place, and time. He appears well-developed and well-nourished. No distress.  HENT:  Head: Normocephalic and atraumatic.  Eyes: EOM are normal. Pupils are equal, round, and reactive to light.  Neck: Neck supple. No thyromegaly present.  Cardiovascular: Normal rate and regular rhythm.   Pulmonary/Chest: Effort normal. No respiratory distress.  Musculoskeletal: Normal range of motion. He exhibits no edema.  Lymphadenopathy:    He has no cervical adenopathy.  Neurological: He is alert and oriented to person, place, and time.  Decreased sensation to light touch in his hands without change in motor function noted/fingers thumb opposition seems intact  Skin: Skin is warm and dry.  Psychiatric: He has a normal mood and affect. His behavior is normal.    Hemoglobin A1c was over 11 in February Sedimentation rate was 40 Assessment & Plan:    I have completed the patient encounter in its entirety as documented by the scribe, with editing by me where necessary. Robert P. Merla Richesoolittle, M.D.  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 Nile Riggs  Gout--uric acid - asymptomatic 6.7 February  Nephrolithiasis-stable  Obesity, Class III, BMI 40-49.9 (morbid obesity)  Hepatomegaly-steatosis///with normal LFTs  As he goes for colonoscopy this can also be considered by gastroenterology  Meds ordered this encounter  Medications  . HYDROcodone-acetaminophen (NORCO) 10-325 MG per tablet    Sig: Take  1 tablet by mouth every 4 (four) hours as needed.    Dispense:  180 tablet    Refill:  0  . HYDROcodone-acetaminophen (NORCO) 10-325 MG per tablet    Sig: Take 1 tablet by mouth every 4 (four) hours as needed. For 30 d after date signed    Dispense:  180 tablet    Refill:  0  . HYDROcodone-acetaminophen (NORCO) 10-325 MG per tablet    Sig: Take 1 tablet by mouth every 4 (four) hours as needed. For 60 days after date signed    Dispense:  180 tablet    Refill:  0  . lisinopril (PRINIVIL,ZESTRIL) 10 MG tablet    Sig: Take 1 tablet (10 mg total) by mouth daily.    Dispense:  90 tablet    Refill:  1   to restart his insulin in his past dose of 80 units twice a day Patient Instructions   Patient Instructions--his recent blood sugars have all been above 250 when he using 80 units of insulin morning and evening --we will plan increase =   If Morning blood sugar is above 150 3 days in a row, then increase insulin at suppertime 3-5 units  If suppertime bloodsugar is above 150 3 days in a row, then lncrease morning insulin 3-5 units  Recheck 4-5 weeks  If uncontrolled will obtain endocrine consult for further management    recheck 1 month

## 2013-08-29 ENCOUNTER — Encounter: Payer: Self-pay | Admitting: Internal Medicine

## 2013-11-04 ENCOUNTER — Ambulatory Visit (INDEPENDENT_AMBULATORY_CARE_PROVIDER_SITE_OTHER): Payer: Medicare Other | Admitting: Internal Medicine

## 2013-11-04 VITALS — BP 128/88 | HR 80 | Temp 97.9°F | Resp 18 | Ht 70.0 in | Wt 284.6 lb

## 2013-11-04 DIAGNOSIS — N481 Balanitis: Secondary | ICD-10-CM

## 2013-11-04 DIAGNOSIS — E1165 Type 2 diabetes mellitus with hyperglycemia: Principal | ICD-10-CM

## 2013-11-04 DIAGNOSIS — IMO0001 Reserved for inherently not codable concepts without codable children: Secondary | ICD-10-CM

## 2013-11-04 DIAGNOSIS — E1149 Type 2 diabetes mellitus with other diabetic neurological complication: Secondary | ICD-10-CM

## 2013-11-04 DIAGNOSIS — I10 Essential (primary) hypertension: Secondary | ICD-10-CM

## 2013-11-04 DIAGNOSIS — N476 Balanoposthitis: Secondary | ICD-10-CM

## 2013-11-04 DIAGNOSIS — E1142 Type 2 diabetes mellitus with diabetic polyneuropathy: Secondary | ICD-10-CM

## 2013-11-04 LAB — POCT GLYCOSYLATED HEMOGLOBIN (HGB A1C): HEMOGLOBIN A1C: 13.3

## 2013-11-04 MED ORDER — FLUCONAZOLE 150 MG PO TABS: 150.0000 mg | ORAL_TABLET | Freq: Once | ORAL | Status: DC

## 2013-11-04 MED ORDER — HYDROCODONE-ACETAMINOPHEN 10-325 MG PO TABS: 1.0000 | ORAL_TABLET | ORAL | Status: DC | PRN

## 2013-11-04 MED ORDER — CLOTRIMAZOLE-BETAMETHASONE 1-0.05 % EX CREA: 1.0000 "application " | TOPICAL_CREAM | Freq: Two times a day (BID) | CUTANEOUS | Status: DC

## 2013-11-04 NOTE — Progress Notes (Signed)
Subjective:  This chart was scribed for [name] Lin, MD by Roxan Diesel, Scribe.  This patient was seen in [place] [facility] and the patient's care was started at 2:01 PM.   Patient ID: [name] Snow, male    DOB: August 19, 1965, 48 y.o.   MRN: [id number]  Chief Complaint  Patient presents with   Medication Refill    lisinpril and hydrocodone    HPI  HPI Comments: [name] Snow is a 48 y.o. male who presents to [facility] for a medication refill.  Pt needs a refill on his lisinopril and hydrocodone and reconsideration of his diabetes.  Otherwise he states he is doing okay overall.  He states the pain in his groin and lower back came back about a month ago and he was unable to walk for 5 days.  Pain was worsened by attempting to move his legs.  It has improved somewhat but he has continued to have "light" pain in his back every day. C. evaluation per CT scan for testicular pain-this revealed no pathology in the testicular area but noted degenerative disc disease at L4-5.  Sugars have been around 260.  He is off of his insulin because he is concerned about "taking all these drugs and it's just not working."  He is trying to lose weight and is hoping that he can manage his blood sugar by losing weight.  He is hoping to drop to 225 pounds by the end of this year.  He states he has changed his eating habits and cut out sugary drinks.  He does not like plain water but has been drinking seltzer water which he prefers. Neuropathy has been unchanged.  He has developed inflammatory changes with itching of his foreskin with tightness as in the past. 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 Authorizing Provider  Blood Gluc Meter Disp-Strips (BLOOD GLUCOSE METER DISPOSABLE) DEVI 1 strip by Percutaneous route 2 (two) times daily before a meal. Test blood sugar once a day as directed. Dx Code: 250.00 07/09/13  Yes Leandrew Koyanagi, MD  Blood Glucose Monitoring Suppl (BLOOD GLUCOSE MONITOR KIT) KIT Use as directed 12/09/12  Yes Leandrew Koyanagi, MD  gabapentin (NEURONTIN) 300 MG capsule One am, one midday, and 2 hs 07/09/13  Yes Leandrew Koyanagi, MD  HYDROcodone-acetaminophen (NORCO) 10-325 MG per tablet Take 1 tablet by mouth every 4 (four) hours as needed. 08/08/13  Yes Leandrew Koyanagi, MD  HYDROcodone-acetaminophen [facility]) 10-325 MG per tablet Take 1 tablet by mouth every 4 (four) hours as needed. For 30 d after date signed  08/08/13  Yes Leandrew Koyanagi, MD  HYDROcodone-acetaminophen [facility]) 10-325 MG per tablet Take 1 tablet by mouth every 4 (four) hours as needed. For 60 days after date signed 08/08/13  Yes Leandrew Koyanagi, MD  insulin NPH-regular (NOVOLIN 70/30) (70-30) 100 UNIT/ML injection Inject 55 Units into the skin 2 (two) times daily with a meal. Dose will be escalating 05/04/13  Yes Leandrew Koyanagi, MD  Insulin Syringe-Needle U-100 (INSULIN SYRINGE 1CC/31GX5/16") 31G X 5/16" 1 ML MISC Use as directed once a day. Dx Code: 250.00 07/09/13  Yes Leandrew Koyanagi, MD  Lancets MISC Use as directed once day. Dx Code: 250.00 07/09/13   Yes Leandrew Koyanagi, MD  lisinopril (PRINIVIL,ZESTRIL) 10 MG tablet Take 1 tablet (10 mg total) by mouth daily. 08/08/13  Yes Leandrew Koyanagi, MD     Review of Systems  Genitourinary:       Groin pain  Musculoskeletal: Positive for back pain.   mild weight loss No exacerbation of gout No chest pain or palpitations No shortness of breath No edema      Objective:   Physical Exam  Nursing note and vitals reviewed. Constitutional: He is oriented to person, place, and time. No distress.  Continues with morbid obesity  HENT:  Head: Normocephalic and atraumatic.  Eyes: Conjunctivae and EOM are normal. Pupils are equal, round, and reactive to light.  Neck: Neck supple. No thyromegaly present.  Cardiovascular: Normal rate and regular rhythm.   Pulmonary/Chest: Effort normal.  Genitourinary:  Changes of balanitis at foreskin  Musculoskeletal: Normal range of motion. He exhibits no edema.  Neurological: He is alert and oriented to person, place, and time.  Skin: Skin is warm and dry.  Psychiatric: He has a normal mood and affect. His behavior is normal.  He is jovial as usual    Wt Readings from Last 3 Encounters:  11/04/13 284 lb 9.6 oz (129.094 kg)  08/08/13 296 lb 6.4 oz (134.446 kg)  07/09/13 293 lb (132.904 kg)    BP 128/88   Pulse 80   Temp(Src) 97.9 F (36.6 C) (Oral)   Resp 18   Ht $R'5\' 10"'qx$  (1.778 m)   Wt 284 lb 9.6 oz (129.094 kg)   BMI 40.84 kg/m2   SpO2 96%      Assessment & Plan:  Type II or unspecified type diabetes mellitus without mention of complication, uncontrolled - Plan: POCT glycosylated hemoglobin (Hb A1C)--- he will restart insulin and continue his extra effort at weight loss Recheck 3 months  Diabetic polyneuropathy associated with type 2 diabetes mellitus - Plan: HYDROcodone-acetaminophen (NORCO) 10-325  refills  Essential hypertension-stable  Balanitis  Meds ordered this encounter  Medications                   clotrimazole-betamethasone (LOTRISONE) cream    Sig: Apply 1 application topically 2 (two) times daily.    Dispense:  30 g    Refill:  0   HYDROcodone-acetaminophen (NORCO) 10-325 MG per tablet    Sig: Take 1 tablet by mouth every 4 (four) hours as needed.    Dispense:  180 tablet    Refill:  0   HYDROcodone-acetaminophen (NORCO) 10-325 MG per tablet    Sig: Take 1 tablet by mouth every 4 (four) hours as needed. For 30 d after signed    Dispense:  180 tablet    Refill:  0   fluconazole (DIFLUCAN) 150 MG tablet    Sig: Take 1 tablet (150 mg  total) by mouth once. Once a week for 4 weeks    Dispense:  4 tablet    Refill:  0   HYDROcodone-acetaminophen (NORCO) 10-325 MG per tablet    Sig: Take 1 tablet by mouth every 4 (four) hours as needed. For 60 days after date signed    Dispense:  180 tablet    Refill:  0       I have completed the patient encounter in its entirety as documented by the scribe, with editing by me where necessary. Robert P. Laney Pastor, M.D.

## 2014-01-30 ENCOUNTER — Ambulatory Visit (INDEPENDENT_AMBULATORY_CARE_PROVIDER_SITE_OTHER): Payer: Medicare Other | Admitting: Internal Medicine

## 2014-01-30 VITALS — BP 138/88 | HR 74 | Temp 98.8°F | Resp 18 | Wt 286.6 lb

## 2014-01-30 DIAGNOSIS — IMO0001 Reserved for inherently not codable concepts without codable children: Secondary | ICD-10-CM

## 2014-01-30 DIAGNOSIS — I10 Essential (primary) hypertension: Secondary | ICD-10-CM

## 2014-01-30 DIAGNOSIS — E1149 Type 2 diabetes mellitus with other diabetic neurological complication: Secondary | ICD-10-CM

## 2014-01-30 DIAGNOSIS — S300XXA Contusion of lower back and pelvis, initial encounter: Secondary | ICD-10-CM

## 2014-01-30 DIAGNOSIS — E1142 Type 2 diabetes mellitus with diabetic polyneuropathy: Secondary | ICD-10-CM

## 2014-01-30 DIAGNOSIS — E1165 Type 2 diabetes mellitus with hyperglycemia: Secondary | ICD-10-CM

## 2014-01-30 MED ORDER — HYDROCODONE-ACETAMINOPHEN 10-325 MG PO TABS
1.0000 | ORAL_TABLET | ORAL | Status: DC | PRN
Start: 2014-01-30 — End: 2014-04-28

## 2014-01-30 MED ORDER — INSULIN NPH ISOPHANE & REGULAR (70-30) 100 UNIT/ML ~~LOC~~ SUSP: 55.0000 [IU] | Freq: Two times a day (BID) | SUBCUTANEOUS | Status: DC

## 2014-01-30 MED ORDER — HYDROCODONE-ACETAMINOPHEN 10-325 MG PO TABS: 1.0000 | ORAL_TABLET | ORAL | Status: DC | PRN

## 2014-01-30 MED ORDER — LISINOPRIL 10 MG PO TABS: 10.0000 mg | ORAL_TABLET | Freq: Every day | ORAL | Status: DC

## 2014-01-30 MED ORDER — LANCETS MISC: Status: DC

## 2014-01-30 MED ORDER — "INSULIN SYRINGE 31G X 5/16"" 1 ML MISC": Status: DC

## 2014-01-30 MED ORDER — BLOOD GLUC METER DISP-STRIPS DEVI: 1.0000 | Freq: Two times a day (BID) | Status: DC

## 2014-01-30 NOTE — Progress Notes (Signed)
Subjective:   This chart was scribed for [name] Lin, MD by Forrestine Him, [facility] Scribe. This patient was seen in room 11 and the patient's care was started 5:31 PM.    Patient ID: [name] Snow, male    DOB: 1965/11/25, 48 y.o.   MRN: [id number]  Chief Complaint  Patient presents with   Medication Refill   Tailbone Pain    fell last wednesday     HPI  HPI Comments: [name] Snow is a 48 y.o. male with a PMHx of type 2 diabetes mellitis and gout who presents to [facility] complaining of a fall sustained a few days ago. Pt states he tripped and slipped over his dogs bone resulting in him falling and landing on his tailbone. Now reports ongoing, constant, moderate pain to the tailbone.  Pt has been trying Metformin in place of his insulin but states it has not been effective for him. He is inquiring about returning to insulin. Fasting sugars have been running about 300-320. Pt states blood pressure has been stable. Neuropathy has improved as paresthesia as not as severe at night time. He states he is fairly active throughout the day but denies a structured physical activity plan. See last ov---did not pursue the plan!!!  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 Authorizing Provider  Blood Gluc Meter Disp-Strips (BLOOD GLUCOSE METER DISPOSABLE) DEVI 1 strip by Percutaneous route 2 (two) times daily before a meal. Test blood sugar once a day as directed. Dx Code: 250.00 07/09/13  Yes Leandrew Koyanagi, MD  Blood Glucose Monitoring Suppl (BLOOD GLUCOSE MONITOR KIT) KIT Use as directed 12/09/12  Yes Leandrew Koyanagi, MD  clotrimazole-betamethasone (LOTRISONE) cream Apply 1 application topically 2 (two) times daily. 11/04/13  Yes Leandrew Koyanagi, MD  gabapentin (NEURONTIN) 300 MG capsule One am, one midday, and 2 hs 07/09/13  Yes Leandrew Koyanagi, MD  HYDROcodone-acetaminophen (NORCO) 10-325 MG per tablet Take 1 tablet by mouth every 4 (four) hours as needed. For 30 d after date signed 08/08/13  Yes Leandrew Koyanagi, MD  insulin NPH-regular (NOVOLIN 70/30) (70-30) 100 UNIT/ML injection Inject 55 Units into the skin 2 (two) times daily with a meal. Dose will be escalating 05/04/13  Yes Leandrew Koyanagi, MD  Insulin Syringe-Needle U-100 (INSULIN SYRINGE 1CC/31GX5/16") 31G X 5/16" 1 ML MISC Use as directed once a day. Dx Code: 250.00 07/09/13  Yes Leandrew Koyanagi, MD  Lancets MISC Use as directed once day. Dx Code: 250.00 07/09/13  Yes Leandrew Koyanagi,  MD  lisinopril (PRINIVIL,ZESTRIL) 10 MG tablet Take 1 tablet (10 mg total) by mouth daily. 08/08/13  Yes Leandrew Koyanagi, MD  fluconazole (DIFLUCAN) 150 MG tablet Take 1 tablet (150 mg total) by mouth once. Once a week for 4 weeks 11/04/13   Leandrew Koyanagi, MD  HYDROcodone-acetaminophen [facility]) 10-325 MG per tablet Take 1 tablet by mouth every 4 (four) hours as needed. 11/04/13   Leandrew Koyanagi, MD  HYDROcodone-acetaminophen (NORCO) 10-325 MG per tablet Take 1 tablet by mouth every 4 (four) hours as needed. For 30 d  after signed 11/04/13   Leandrew Koyanagi, MD  HYDROcodone-acetaminophen [facility]) 10-325 MG per tablet Take 1 tablet by mouth every 4 (four) hours as needed. For 60 days after date signed 11/04/13   Leandrew Koyanagi, MD     Review of Systems  Constitutional: Negative for fever and chills.   no vision changes No weight loss No palpitations or chest pain No GU difficulties  Triage Vitals: BP 138/88   Pulse 74   Temp(Src) 98.8 F (37.1 C) (Oral)   Resp 18   Wt 286 lb 9.6 oz (130.001 kg)   SpO2 97%   Objective:  Physical Exam Wt Readings from Last 3 Encounters:  01/30/14 286 lb 9.6 oz (130.001 kg)  11/04/13 284 lb 9.6 oz (129.094 kg)  08/08/13 296 lb 6.4 oz (134.446 kg)   HEENT clear Heart regular without murmur He is tender over the coccyx without ecchymoses or swelling and has good range of motion of the low back and hips given his prior surgery Cranial nerves intact vibratory sensation present at the ankles Sensation intact over the feet except for the very tips of the toes     Assessment & Plan:   I personally performed the services described in this documentation, which was scribed in my presence. The recorded information has been reviewed and is accurate.   Diabetic polyneuropathy associated with type 2 diabetes mellitus  Type II or unspecified type diabetes mellitus  uncontrolled - He will start with insulin therapy and followup guidelines as before for titration until his blood sugars are between 100 120 in the morning and before dinner--- cannot afford Lantus or non-generic oral medications Hgb a1c 3 mo  Essential hypertension -cont lisin  Contusion of coccyx, initial encounter---heat/protect  Obesity--weight loss once again encouraged  Meds ordered this encounter  Medications   HYDROcodone-acetaminophen (NORCO) 10-325 MG per tablet    Sig: Take 1 tablet by mouth every 4 (four) hours as needed. For 30 d after date signed    Dispense:  180 tablet    Refill:  0     HYDROcodone-acetaminophen (NORCO) 10-325 MG per tablet    Sig: Take 1 tablet by mouth every 4 (four) hours as needed. For 60 days after date signed    Dispense:  180 tablet    Refill:  0   HYDROcodone-acetaminophen (NORCO) 10-325 MG per tablet    Sig: Take 1 tablet by mouth every 4 (four) hours as needed.    Dispense:  180 tablet    Refill:  0   insulin NPH-regular Human (NOVOLIN 70/30) (70-30) 100 UNIT/ML injection    Sig: Inject 55 Units into the skin 2 (two) times daily with a meal. Dose will be escalating    Dispense:  30 mL    Refill:  12   lisinopril (PRINIVIL,ZESTRIL) 10 MG tablet    Sig: Take 1 tablet (10 mg total) by mouth daily.  Dispense:  90 tablet    Refill:  1   Insulin Syringe-Needle U-100 (INSULIN SYRINGE 1CC/31GX5/16") 31G X 5/16" 1 ML MISC    Sig: Use as directed once a day. Dx Code: 250.00    Dispense:  100 each    Refill:  1   Blood Gluc Meter Disp-Strips (BLOOD GLUCOSE METER DISPOSABLE) DEVI    Sig: 1 strip by Percutaneous route 2 (two) times daily before a meal. Test blood sugar once a day as directed. Dx Code: 250.00    Dispense:  180 each    Refill:  3    Please dispense whichever strips that goes with the monitor of patients choice.   Lancets MISC    Sig: Use as directed once day. Dx Code: 250.00    Dispense:  100 each    Refill:  2    Please dispense whichever lancets that goes with the monitor of patients choice.

## 2014-02-06 ENCOUNTER — Encounter: Payer: Self-pay | Admitting: Internal Medicine

## 2014-03-29 ENCOUNTER — Telehealth: Payer: Self-pay | Admitting: *Deleted

## 2014-03-29 NOTE — Telephone Encounter (Signed)
[facility].  [name] LyonsCasey stated that patient was a no show for his scheduled appointment and had not responded to their communication attempts to reschedule.

## 2014-04-28 ENCOUNTER — Ambulatory Visit (INDEPENDENT_AMBULATORY_CARE_PROVIDER_SITE_OTHER): Payer: Medicare Other | Admitting: Internal Medicine

## 2014-04-28 VITALS — BP 124/82 | HR 86 | Temp 98.3°F | Resp 18 | Ht 70.5 in | Wt 298.0 lb

## 2014-04-28 DIAGNOSIS — E1165 Type 2 diabetes mellitus with hyperglycemia: Secondary | ICD-10-CM

## 2014-04-28 DIAGNOSIS — E1140 Type 2 diabetes mellitus with diabetic neuropathy, unspecified: Secondary | ICD-10-CM

## 2014-04-28 DIAGNOSIS — IMO0002 Reserved for concepts with insufficient information to code with codable children: Secondary | ICD-10-CM

## 2014-04-28 DIAGNOSIS — E1142 Type 2 diabetes mellitus with diabetic polyneuropathy: Secondary | ICD-10-CM

## 2014-04-28 LAB — POCT GLYCOSYLATED HEMOGLOBIN (HGB A1C): HEMOGLOBIN A1C: 10.3

## 2014-04-28 MED ORDER — GABAPENTIN 400 MG PO CAPS: ORAL_CAPSULE | ORAL | Status: DC

## 2014-04-28 MED ORDER — HYDROCODONE-ACETAMINOPHEN 10-325 MG PO TABS: 1.0000 | ORAL_TABLET | ORAL | Status: DC | PRN

## 2014-04-28 NOTE — Progress Notes (Addendum)
Subjective:  This chart was scribed for [name], MD by Erling Conte, Medical Scribe. This patient was seen in Room 5 and the patient's care was started at 12:06 PM.   Patient ID: [name] Snow, male    DOB: 17-Jan-1966, 48 y.o.   MRN: [id number]  Chief Complaint  Patient presents with  . Medication Refill    Hydrocodone, gabapentin     HPI HPI Comments: [name] Snow is a 48 y.o. male who presents to the [facility]   Patient Active Problem List   Diagnosis Date Noted  . Hepatomegaly-steatosis 08/08/2013  . Type II or unspecified type diabetes mellitus without mention of complication, uncontrolled 05/04/2013  . DM neuropathy, painful 10/19/2011  . Hip pain, chronic 10/19/2011  . Onychomycosis 10/19/2011  . Nephrolithiasis 10/19/2011  . Carpal tunnel syndrome 10/19/2011  . Obesity, Class III, BMI 40-49.9 (morbid obesity) 04/30/2011  . Type II or unspecified type diabetes mellitus with unspecified complication, uncontrolled 04/30/2011  . Gout 04/30/2011  . 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 Cataract   . Clotting disorder   . CHF (congestive heart failure)   . Chronic kidney disease   . Hypertension   . Diabetes mellitus   . Arthritis     gout   Past Surgical History  Procedure Laterality Date  . Joint replacement  020112    R Hip   No Known Allergies Prior to Admission medications   Medication Sig Start Date End Date Taking? Authorizing Provider  Blood Gluc Meter Disp-Strips (BLOOD GLUCOSE METER DISPOSABLE) DEVI 1 strip by Percutaneous route 2 (two) times daily before a meal. Test blood sugar once a day as directed. Dx Code: 250.00 01/30/14  Yes Leandrew Koyanagi, MD  Blood Glucose Monitoring Suppl (BLOOD GLUCOSE MONITOR KIT) KIT Use as directed 12/09/12  Yes Leandrew Koyanagi, MD  gabapentin (NEURONTIN) 300 MG capsule One am, one midday, and 2 hs 07/09/13  Yes Leandrew Koyanagi, MD    HYDROcodone-acetaminophen (NORCO) 10-325 MG per tablet Take 1 tablet by mouth every 4 (four) hours as needed. For 30 d after date signed 01/30/14  Yes Leandrew Koyanagi, MD  HYDROcodone-acetaminophen [facility]) 10-325 MG per tablet Take 1 tablet by mouth every 4 (four) hours as needed. For 60 days after date signed 01/30/14  Yes Leandrew Koyanagi, MD  HYDROcodone-acetaminophen [facility]) 10-325 MG per tablet Take 1 tablet by mouth every 4 (four) hours as needed. 01/30/14  Yes Leandrew Koyanagi, MD  insulin NPH-regular Human (NOVOLIN 70/30) (70-30) 100 UNIT/ML injection Inject 55 Units into the skin 2 (two) times daily with a meal. Dose will be escalating 01/30/14  Yes Leandrew Koyanagi, MD  Insulin Syringe-Needle U-100 (INSULIN SYRINGE 1CC/31GX5/16") 31G X 5/16" 1 ML MISC Use as directed once a day. Dx Code: 250.00 01/30/14  Yes Leandrew Koyanagi, MD  Lancets MISC Use as directed once day. Dx Code: 250.00 01/30/14  Yes Leandrew Koyanagi, MD  lisinopril (PRINIVIL,ZESTRIL) 10 MG tablet Take 1 tablet (10 mg total) by mouth daily. 01/30/14  Yes Leandrew Koyanagi, MD  clotrimazole-betamethasone (LOTRISONE) cream Apply 1 application topically 2 (two) times daily. Patient not taking: Reported on 04/28/2014 11/04/13   Leandrew Koyanagi, MD  fluconazole (DIFLUCAN) 150 MG tablet Take 1 tablet (150 mg total) by mouth once. Once a week for 4 weeks Patient not taking: Reported on 04/28/2014 11/04/13   Leandrew Koyanagi, MD  History   Social History  . Marital Status: Married    Spouse Name: N/A    Number of Children: N/A  . Years of Education: N/A   Occupational History  . Not on file.   Social History Main Topics  . Smoking status: Never Smoker   . Smokeless tobacco: Not on file  . Alcohol Use: No  . Drug Use: No  . Sexual Activity: Yes    Birth Control/ Protection: None   Other Topics Concern  . Not on file   Social History Narrative     Review of Systems     Objective:   Physical Exam   Constitutional: He is oriented to person, place, and time. He appears well-developed and well-nourished. No distress.  HENT:  Head: Normocephalic and atraumatic.  Eyes: Conjunctivae and EOM are normal.  Neck: Neck supple. No tracheal deviation present.  Cardiovascular: Normal rate.   Pulmonary/Chest: Effort normal. No respiratory distress.  Musculoskeletal: Normal range of motion.  Neurological: He is alert and oriented to person, place, and time.  Skin: Skin is warm and dry.  Psychiatric: He has a normal mood and affect. His behavior is normal.  Nursing note and vitals reviewed.  Filed Vitals:   04/28/14 1035  BP: 124/82  Pulse: 86  Temp: 98.3 F (36.8 C)  Resp: 18        Assessment & Plan:  DM type 2, uncontrolled, with neuropathy   He will start to record blood sugars before his 2 doses of  Insulin and if he  wakes with symptoms suggesting hypoglycemia and send me the results in 7  Days.. Pain secondary to neuropathy and hip (status post replacement for avascular  necrosis-LCP dz))  Obesity, Class III, BMI 40-49.9 (morbid obesity)  We once again discussed the necessity weight loss for diabetic control and  prevention of progression  Hepatic steatosis  ! Weight loss//he declined statins as too many side effects  Meds ordered this encounter  Medications  . HYDROcodone-acetaminophen (NORCO) 10-325 MG per tablet    Sig: Take 1 tablet by mouth every 4 (four) hours as needed. For 30 d after date signed    Dispense:  180 tablet    Refill:  0  . HYDROcodone-acetaminophen (NORCO) 10-325 MG per tablet    Sig: Take 1 tablet by mouth every 4 (four) hours as needed. For 60 days after date signed    Dispense:  180 tablet    Refill:  0  . HYDROcodone-acetaminophen (NORCO) 10-325 MG per tablet    Sig: Take 1 tablet by mouth every 4 (four) hours as needed.    Dispense:  180 tablet    Refill:  0  . gabapentin (NEURONTIN) 400 MG capsule    Sig: One am, one midday, and 3 hs     Dispense:  150 capsule    Refill:  11    I personally performed the services described in this documentation, which was scribed in my presence. The recorded information has been reviewed and is accurate. I have completed the patient encounter in its entirety as documented by the scribe, with editing by me where necessary. Tarica Harl P. Laney Pastor, M.D.

## 2014-06-17 ENCOUNTER — Ambulatory Visit (INDEPENDENT_AMBULATORY_CARE_PROVIDER_SITE_OTHER): Payer: Medicare Other

## 2014-06-17 ENCOUNTER — Ambulatory Visit (INDEPENDENT_AMBULATORY_CARE_PROVIDER_SITE_OTHER): Payer: Medicare Other | Admitting: Internal Medicine

## 2014-06-17 VITALS — BP 122/62 | HR 72 | Temp 98.4°F | Resp 16 | Ht 70.0 in | Wt 297.0 lb

## 2014-06-17 DIAGNOSIS — IMO0002 Reserved for concepts with insufficient information to code with codable children: Secondary | ICD-10-CM

## 2014-06-17 DIAGNOSIS — R109 Unspecified abdominal pain: Secondary | ICD-10-CM

## 2014-06-17 DIAGNOSIS — R911 Solitary pulmonary nodule: Secondary | ICD-10-CM

## 2014-06-17 DIAGNOSIS — E1140 Type 2 diabetes mellitus with diabetic neuropathy, unspecified: Secondary | ICD-10-CM

## 2014-06-17 DIAGNOSIS — I2510 Atherosclerotic heart disease of native coronary artery without angina pectoris: Secondary | ICD-10-CM | POA: Insufficient documentation

## 2014-06-17 DIAGNOSIS — I7090 Unspecified atherosclerosis: Secondary | ICD-10-CM

## 2014-06-17 DIAGNOSIS — E1165 Type 2 diabetes mellitus with hyperglycemia: Secondary | ICD-10-CM

## 2014-06-17 LAB — POCT URINALYSIS DIPSTICK
BILIRUBIN UA: NEGATIVE
GLUCOSE UA: NEGATIVE
Ketones, UA: NEGATIVE
Leukocytes, UA: NEGATIVE
NITRITE UA: NEGATIVE
PROTEIN UA: NEGATIVE
Spec Grav, UA: 1.02
UROBILINOGEN UA: 0.2
pH, UA: 5

## 2014-06-17 LAB — POCT UA - MICROSCOPIC ONLY
Bacteria, U Microscopic: NEGATIVE
CRYSTALS, UR, HPF, POC: NEGATIVE
Casts, Ur, LPF, POC: NEGATIVE
Mucus, UA: NEGATIVE
WBC, Ur, HPF, POC: NEGATIVE
Yeast, UA: NEGATIVE

## 2014-06-17 MED ORDER — HYDROCODONE-ACETAMINOPHEN 10-325 MG PO TABS: 1.0000 | ORAL_TABLET | ORAL | Status: DC | PRN

## 2014-06-17 MED ORDER — ONDANSETRON HCL 8 MG PO TABS: 8.0000 mg | ORAL_TABLET | Freq: Three times a day (TID) | ORAL | Status: DC | PRN

## 2014-06-17 MED ORDER — HYDROMORPHONE HCL 4 MG PO TABS: 4.0000 mg | ORAL_TABLET | ORAL | Status: DC | PRN

## 2014-06-17 NOTE — Patient Instructions (Signed)
Normal heart size without pericardial or pleural effusion. Right coronary artery atherosclerosis proximally on image 1/ series 301.  Abdomen/Pelvis: Marked hepatic steatosis with advanced hepatomegaly. Greater than 26 cm craniocaudal. No focal liver lesion. Mild prominence of the caudate lobe, without specific evidence of cirrhosis.  Normal spleen, stomach, pancreas, gallbladder, biliary tract, adrenal glands. Too small to characterize interpolar right renal lesion.  A stone in the left renal pelvis measures 1.2 cm. Bilateral extrarenal pelves, without hydronephrosis or hydroureter. No ureteric calculus.  Separate origins of the splenic and common hepatic artery is. Aortic and branch vessel atherosclerosis. No retroperitoneal or retrocrural adenopathy. Normal colon and terminal ileum. Normal small bowel without abdominal ascites. Beam hardening artifact from right hip arthroplasty. No gross pelvic adenopathy. Normal urinary bladder and prostate. No significant free fluid.  Bones/Musculoskeletal: Right hip arthroplasty. Spondylosis with advanced degenerative disc disease at the lumbosacral junction.  IMPRESSION: 1. Hepatic steatosis and marked hepatomegaly. 2. Left extrarenal pelvis stone without urinary tract obstruction. 3. No other explanation for pain. No explanation for weight loss. 4. Degraded evaluation of the pelvis secondary to a right hip arthroplasty and resultant beam hardening artifact. 5. Tiny right lung base nodule. If the patient is at high risk for bronchogenic carcinoma, follow-up chest CT at 1 year is recommended. If the patient is at low risk, no follow-up is needed. This recommendation follows the consensus statement: "Guidelines for Management of Small Pulmonary Nodules Detected on CT Scans: A Statement from the Fleischner Society" as published in Radiology 2005; 237:395-400. Available online  at: [email or web address]/rad/[email or web address]-nodule.htm. 6. Age advanced coronary artery atherosclerosis. Recommend assessment of coronary risk factors and consideration of medical therapy.

## 2014-06-17 NOTE — Progress Notes (Addendum)
Subjective:  This chart was scribed for [name], MD by Dellis Filbert, ED Scribe at [facility].The patient was seen in exam room 01 and the patient's care was started at 8:35 AM.   Patient ID: [name] Snow, male    DOB: 05-25-1965, 49 y.o.   MRN: [id number] Chief Complaint  Patient presents with  . Flank Pain    left side, history of kidney stones, x 2 days   HPI  HPI Comments: [name] Snow is a 50 y.o. 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 He has a subjective fever and chills as associated symptoms. He had a kidney stone of March of this year. Pt denies gross hematuria.  No dysuria. No discharge. Stable monogamous relationship. Note CT scan 07/22/2013 documented left renal pelvic stone  Note last office visit December 2015  Notes his pharmacy has recently closed down. He has concerns about his chronic pain medication   Patient Active Problem List   Diagnosis Date Noted  . Hepatomegaly-steatosis 08/08/2013  . DM neuropathy, painful 10/19/2011  . Hip pain, chronic 10/19/2011  . Onychomycosis 10/19/2011  . Nephrolithiasis 10/19/2011  . Carpal tunnel syndrome 10/19/2011  . Obesity, Class III, BMI 40-49.9 (morbid obesity) 04/30/2011  . DM type 2, uncontrolled, with neuropathy 04/30/2011  . Gout 04/30/2011  . 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 Cataract   . Clotting disorder   . CHF (congestive heart failure)   . Chronic kidney disease   . Hypertension   . Diabetes mellitus   . Arthritis     gout   Past Surgical History  Procedure Laterality Date  . Joint replacement  020112    R Hip   No Known Allergies Prior to Admission medications   Medication Sig Start Date End Date Taking? Authorizing Provider  Blood Gluc Meter Disp-Strips (BLOOD  GLUCOSE METER DISPOSABLE) DEVI 1 strip by Percutaneous route 2 (two) times daily before a meal. Test blood sugar once a day as directed. Dx Code: 250.00 01/30/14  Yes Leandrew Koyanagi, MD  Blood Glucose Monitoring Suppl (BLOOD GLUCOSE MONITOR KIT) KIT Use as directed 12/09/12  Yes Leandrew Koyanagi, MD  clotrimazole-betamethasone (LOTRISONE) cream Apply 1 application topically 2 (two) times daily. 11/04/13  Yes Leandrew Koyanagi, MD  fluconazole (DIFLUCAN) 150 MG tablet Take 1 tablet (150 mg total) by mouth once. Once a week for 4 weeks 11/04/13  Yes Leandrew Koyanagi, MD  gabapentin (NEURONTIN) 400 MG capsule One am, one midday, and 3 hs 04/28/14  Yes Leandrew Koyanagi, MD  HYDROcodone-acetaminophen (NORCO) 10-325 MG per tablet Take 1 tablet by mouth every 4 (four) hours as needed. For 30 d after date signed 04/28/14  Yes Leandrew Koyanagi, MD  HYDROcodone-acetaminophen [facility]) 10-325 MG per tablet Take 1 tablet by mouth every 4 (four) hours as needed. For 60 days after date signed 04/28/14  Yes Leandrew Koyanagi, MD  HYDROcodone-acetaminophen [facility]) 10-325 MG per tablet Take 1 tablet by mouth every 4 (four) hours as needed. 04/28/14  Yes Leandrew Koyanagi, MD  insulin NPH-regular Human (NOVOLIN 70/30) (70-30) 100 UNIT/ML injection Inject 55 Units into the skin 2 (two) times daily with a meal. Dose will be escalating 01/30/14  Yes Leandrew Koyanagi, MD  Insulin Syringe-Needle U-100 (INSULIN  SYRINGE 1CC/31GX5/16") 31G X 5/16" 1 ML MISC Use as directed once a day. Dx Code: 250.00 01/30/14  Yes Leandrew Koyanagi, MD  Lancets MISC Use as directed once day. Dx Code: 250.00 01/30/14  Yes Leandrew Koyanagi, MD  lisinopril (PRINIVIL,ZESTRIL) 10 MG tablet Take 1 tablet (10 mg total) by mouth daily. 01/30/14  Yes Leandrew Koyanagi, MD   History   Social History  . Marital Status: Married    Spouse Name: N/A    Number of Children: N/A  . Years of Education: N/A   Occupational History  . Not on  file.   Social History Main Topics  . Smoking status: Never Smoker   . Smokeless tobacco: Not on file  . Alcohol Use: No  . Drug Use: No  . Sexual Activity: Yes    Birth Control/ Protection: None   Other Topics Concern  . Not on file   Social History Narrative   Review of Systems  Constitutional: Positive for fever and chills.  Genitourinary: Positive for flank pain. Negative for hematuria.       Objective:  BP 122/62 mmHg  Pulse 72  Temp(Src) 98.4 F (36.9 C)  Resp 16  Ht $R'5\' 10"'Al$  (1.778 m)  Wt 297 lb (134.718 kg)  BMI 42.61 kg/m2  SpO2 97%  Physical Exam  Constitutional: He is oriented to person, place, and time. He appears well-developed and well-nourished. No distress.  HENT:  Head: Normocephalic and atraumatic.  Eyes: Pupils are equal, round, and reactive to light.  Neck: Normal range of motion.  Cardiovascular: Normal rate and regular rhythm.   Pulmonary/Chest: Effort normal. No respiratory distress.  Musculoskeletal: Normal range of motion.  Neurological: He is alert and oriented to person, place, and time.  Skin: Skin is warm and dry.  Psychiatric: He has a normal mood and affect. His behavior is normal.  Nursing note and vitals reviewed.  [facility] reading (PRIMARY) by  Dr. Laney Pastor calcium is in the area of the left kidney which was noted to have a stone at the last CT in March 2015//otherwise the x-rays unremarkable. Radiologist concurs.  Results for orders placed or performed in visit on 06/17/14  POCT UA - Microscopic Only  Result Value Ref Range   WBC, Ur, HPF, POC neg    RBC, urine, microscopic 15-25    Bacteria, U Microscopic neg    Mucus, UA neg    Epithelial cells, urine per micros 0-3    Crystals, Ur, HPF, POC neg    Casts, Ur, LPF, POC neg    Yeast, UA neg   POCT urinalysis dipstick  Result Value Ref Range   Color, UA yellow    Clarity, UA clear    Glucose, UA neg    Bilirubin, UA neg    Ketones, UA neg    Spec Grav, UA 1.020    Blood,  UA small    pH, UA 5.0    Protein, UA neg    Urobilinogen, UA 0.2    Nitrite, UA neg    Leukocytes, UA Negative        Assessment & Plan:  Problem #1 flank pain with nausea secondary to nephrolithiasis Problem #2 hematuria  This is suggestive of an acute renal stone He declines Toradol due to past side effects We will used Dilauded for 48 hours and then switch to his routine use of hydrocodone for his other chronic pain If he is not much better in 48 hours we'll proceed to CT  urogram  I took this opportunity to discuss further the findings on his recent CT FINDINGS: Lower Chest: 3 mm right lower lobe lung nodule on image 3/series 4. Normal heart size without pericardial or pleural effusion. Right coronary artery atherosclerosis proximally on image 1/ series 301.  Abdomen/Pelvis: Marked hepatic steatosis with advanced hepatomegaly. Greater than 26 cm craniocaudal. No focal liver lesion. Mild prominence of the caudate lobe, without specific evidence of cirrhosis.  Normal spleen, stomach, pancreas, gallbladder, biliary tract, adrenal glands. Too small to characterize interpolar right renal lesion.  A stone in the left renal pelvis measures 1.2 cm. Bilateral extrarenal pelves, without hydronephrosis or hydroureter. No ureteric calculus.  Separate origins of the splenic and common hepatic artery is. Aortic and branch vessel atherosclerosis. No retroperitoneal or retrocrural adenopathy. Normal colon and terminal ileum. Normal small bowel without abdominal ascites. Beam hardening artifact from right hip arthroplasty. No gross pelvic adenopathy. Normal urinary bladder and prostate. No significant free fluid.  Bones/Musculoskeletal: Right hip arthroplasty. Spondylosis with advanced degenerative disc disease at the lumbosacral junction.  IMPRESSION: 1. Hepatic steatosis and marked hepatomegaly. 2. Left extrarenal pelvis stone without urinary tract obstruction. 3. No  other explanation for pain. No explanation for weight loss. 4. Degraded evaluation of the pelvis secondary to a right hip arthroplasty and resultant beam hardening artifact. 5. Tiny right lung base nodule. If the patient is at high risk for bronchogenic carcinoma, follow-up chest CT at 1 year is recommended. If the patient is at low risk, no follow-up is needed.   It is obvious that there are many unmet medical issues. His inability to adhere to a diabetes treatment protocol is the most important feature his inability to adhere to weight loss is secondarily important. His inability to adhere to medical protocol is the tertiary issue. [place]. He is unemployed. His wife has serious medical issues. His adolescent son has behavioral problems and obesity..  He has a follow-up appointment for his diabetes care in 1 month where we will discuss this further  I included this in his after visit summary today and stressed the issues that we needed to work on  Meds ordered this encounter  Medications  . HYDROmorphone (DILAUDID) 4 MG tablet    Sig: Take 1 tablet (4 mg total) by mouth every 4 (four) hours as needed for severe pain. Kidney stone    Dispense:  15 tablet    Refill:  0  . HYDROcodone-acetaminophen (NORCO) 10-325 MG per tablet    Sig: Take 1 tablet by mouth every 4 (four) hours as needed.    Dispense:  180 tablet    Refill:  0  . ondansetron (ZOFRAN) 8 MG tablet    Sig: Take 1 tablet (8 mg total) by mouth every 8 (eight) hours as needed for nausea or vomiting.    Dispense:  10 tablet    Refill:  0     I have completed the patient encounter in its entirety as documented by the scribe, with editing by me where necessary. Rilei Kravitz P. Laney Pastor, M.D.

## 2014-06-23 ENCOUNTER — Encounter: Payer: Self-pay | Admitting: Internal Medicine

## 2014-07-18 ENCOUNTER — Ambulatory Visit (INDEPENDENT_AMBULATORY_CARE_PROVIDER_SITE_OTHER): Payer: Medicare Other | Admitting: Internal Medicine

## 2014-07-18 VITALS — BP 132/68 | HR 71 | Temp 97.9°F | Resp 16 | Ht 70.0 in | Wt 292.6 lb

## 2014-07-18 DIAGNOSIS — E1140 Type 2 diabetes mellitus with diabetic neuropathy, unspecified: Secondary | ICD-10-CM | POA: Diagnosis not present

## 2014-07-18 DIAGNOSIS — E1142 Type 2 diabetes mellitus with diabetic polyneuropathy: Secondary | ICD-10-CM

## 2014-07-18 DIAGNOSIS — E1165 Type 2 diabetes mellitus with hyperglycemia: Secondary | ICD-10-CM | POA: Diagnosis not present

## 2014-07-18 DIAGNOSIS — M109 Gout, unspecified: Secondary | ICD-10-CM

## 2014-07-18 DIAGNOSIS — IMO0002 Reserved for concepts with insufficient information to code with codable children: Secondary | ICD-10-CM

## 2014-07-18 LAB — CBC WITH DIFFERENTIAL/PLATELET
Basophils Absolute: 0 10*3/uL (ref 0.0–0.1)
Basophils Relative: 0 % (ref 0–1)
Eosinophils Absolute: 0.2 10*3/uL (ref 0.0–0.7)
Eosinophils Relative: 2 % (ref 0–5)
HEMATOCRIT: 38.3 % — AB (ref 39.0–52.0)
Hemoglobin: 12.7 g/dL — ABNORMAL LOW (ref 13.0–17.0)
Lymphocytes Relative: 37 % (ref 12–46)
Lymphs Abs: 3 10*3/uL (ref 0.7–4.0)
MCH: 28.4 pg (ref 26.0–34.0)
MCHC: 33.2 g/dL (ref 30.0–36.0)
MCV: 85.7 fL (ref 78.0–100.0)
MONO ABS: 0.4 10*3/uL (ref 0.1–1.0)
MONOS PCT: 5 % (ref 3–12)
MPV: 11.9 fL (ref 8.6–12.4)
Neutro Abs: 4.6 10*3/uL (ref 1.7–7.7)
Neutrophils Relative %: 56 % (ref 43–77)
PLATELETS: 198 10*3/uL (ref 150–400)
RBC: 4.47 MIL/uL (ref 4.22–5.81)
RDW: 14.4 % (ref 11.5–15.5)
WBC: 8.2 10*3/uL (ref 4.0–10.5)

## 2014-07-18 LAB — POCT GLYCOSYLATED HEMOGLOBIN (HGB A1C): HEMOGLOBIN A1C: 9.4

## 2014-07-18 MED ORDER — HYDROCODONE-ACETAMINOPHEN 10-325 MG PO TABS: 1.0000 | ORAL_TABLET | ORAL | Status: DC | PRN

## 2014-07-18 NOTE — Progress Notes (Signed)
This chart was scribed for [name], MD by Einar Pheasant, ED Scribe. This patient was seen in room 8 and the patient's care was started at 5:35 PM.  Subjective:    Patient ID: [name] Snow, male    DOB: [birth year], 49 y.o.   MRN: [id number]  Chief Complaint  Patient presents with  . Medication Refill    HPI [name] Snow is a 49 y.o. male with PMhx of arteriosclerosis, DM-2, DM neuropathy, chronic hip pain, carpal tunnel syndrome, Legg-Perthes disease, and gout.  Today, pt presents to the office requesting a refill on his medications. He is requesting a new meter with strips. Pt states that he has decreased his insulin intake. He states that he takes 35 units in the morning and 35 units at night, which has been working better for him. Pt does not endorse changing his diet. He states that he is still working on balancing his diet. Pt is also working on losing weight, he states that he would like to be in the 260s. He also admits to an exacerbation of his Dm neuropathy, with which he states he has been loosing his balance with specially with his eyes closed.  Pt also states that his prescribed Norco has not been effective at alleviating his chronic pain long. He believes that at this point he just takes it to prevents withdrawal symptoms. Pt states that he averages 6 pills daily.   He requesting a refill on gapapentin as well.   Pt denies any fever, neck pain, sore throat, visual disturbance, CP, cough, SOB, abdominal pain, nausea, emesis, diarrhea, urinary symptoms, back pain, HA, weakness, numbness and rash as associated symptoms.    Patient Active Problem List   Diagnosis Date Noted  . Arteriosclerosis 06/17/2014  . Lung nodule-CT 3/15 06/17/2014  . Hepatomegaly-steatosis 08/08/2013  . DM neuropathy, painful 10/19/2011  . Hip pain, chronic 10/19/2011  . Onychomycosis 10/19/2011  . Nephrolithiasis 10/19/2011  . Carpal tunnel syndrome 10/19/2011  . Obesity, Class III, BMI  40-49.9 (morbid obesity) 04/30/2011  . DM type 2, uncontrolled, with neuropathy 04/30/2011  . Gout 04/30/2011  . 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 Cataract   . Clotting disorder   . CHF (congestive heart failure)   . Chronic kidney disease   . Hypertension   . Diabetes mellitus   . Arthritis     gout   Past Surgical History  Procedure Laterality Date  . Joint replacement  020112    R Hip   No Known Allergies Prior to Admission medications   Medication Sig Start Date End Date Taking? Authorizing Provider  gabapentin (NEURONTIN) 400 MG capsule One am, one midday, and 3 hs 04/28/14  Yes Leandrew Koyanagi, MD  HYDROcodone-acetaminophen (NORCO) 10-325 MG per tablet Take 1 tablet by mouth every 4 (four) hours as needed. For 30 d after date signed 04/28/14  Yes Leandrew Koyanagi, MD  HYDROcodone-acetaminophen [facility]) 10-325 MG per tablet Take 1 tablet by mouth every 4 (four) hours as needed. For 60 days after date signed 04/28/14  Yes Leandrew Koyanagi, MD  HYDROcodone-acetaminophen [facility]) 10-325 MG per tablet Take 1 tablet by mouth every 4 (four) hours as needed. 06/17/14  Yes Leandrew Koyanagi, MD  Blood Gluc Meter Disp-Strips (BLOOD GLUCOSE METER DISPOSABLE) DEVI 1 strip by Percutaneous route 2 (two) times daily before a meal. Test blood sugar once a day as directed. Dx Code: 250.00 01/30/14   Linton Ham  Laney Pastor, MD  Blood Glucose Monitoring Suppl (BLOOD GLUCOSE MONITOR KIT) KIT Use as directed 12/09/12   Leandrew Koyanagi, MD  clotrimazole-betamethasone (LOTRISONE) cream Apply 1 application topically 2 (two) times daily. Patient not taking: Reported on 07/18/2014 11/04/13   Leandrew Koyanagi, MD  fluconazole (DIFLUCAN) 150 MG tablet Take 1 tablet (150 mg total) by mouth once. Once a week for 4 weeks 11/04/13   Leandrew Koyanagi, MD  HYDROmorphone (DILAUDID) 4 MG tablet Take 1 tablet (4 mg total) by mouth every 4 (four) hours as needed for severe  pain. Kidney stone 06/17/14   Leandrew Koyanagi, MD  insulin NPH-regular Human (NOVOLIN 70/30) (70-30) 100 UNIT/ML injection Inject 55 Units into the skin 2 (two) times daily with a meal. Dose will be escalating 01/30/14   Leandrew Koyanagi, MD  Insulin Syringe-Needle U-100 (INSULIN SYRINGE 1CC/31GX5/16") 31G X 5/16" 1 ML MISC Use as directed once a day. Dx Code: 250.00 01/30/14   Leandrew Koyanagi, MD  Lancets MISC Use as directed once day. Dx Code: 250.00 01/30/14   Leandrew Koyanagi, MD  lisinopril (PRINIVIL,ZESTRIL) 10 MG tablet Take 1 tablet (10 mg total) by mouth daily. 01/30/14   Leandrew Koyanagi, MD  ondansetron (ZOFRAN) 8 MG tablet Take 1 tablet (8 mg total) by mouth every 8 (eight) hours as needed for nausea or vomiting. 06/17/14   Leandrew Koyanagi, MD   History   Social History  . Marital Status: Married    Spouse Name: N/A  . Number of Children: N/A  . Years of Education: N/A   Occupational History  . Not on file.   Social History Main Topics  . Smoking status: Never Smoker   . Smokeless tobacco: Not on file  . Alcohol Use: No  . Drug Use: No  . Sexual Activity: Yes    Birth Control/ Protection: None   Other Topics Concern  . Not on file   Social History Narrative     Review of Systems  Constitutional: Negative for fever and chills.  HENT: Negative for congestion.   Respiratory: Negative for cough and shortness of breath.   Gastrointestinal: Negative for nausea, vomiting, abdominal pain and diarrhea.  Endocrine:       DM neuropathy  Genitourinary: Negative for difficulty urinating.  Skin: Negative for rash.  Neurological: Negative for dizziness, weakness, numbness and headaches.   Objective:   Physical Exam  Constitutional: He is oriented to person, place, and time. He appears well-developed and well-nourished.  HENT:  Head: Normocephalic and atraumatic.  Cardiovascular: Normal rate.   Pulmonary/Chest: Effort normal.  Abdominal: He exhibits no  distension.  Neurological: He is alert and oriented to person, place, and time.  Skin: Skin is warm and dry.  Psychiatric: He has a normal mood and affect.  Nursing note and vitals reviewed.  BP 132/68 mmHg  Pulse 71  Temp(Src) 97.9 F (36.6 C) (Oral)  Resp 16  Ht _0  (1.778 m)  Wt 292 lb 9.6 oz (132.722 kg)  BMI 41.98 kg/m2  SpO2 97%  Results for orders placed or performed in visit on 07/18/14  POCT glycosylated hemoglobin (Hb A1C)  Result Value Ref Range   Hemoglobin A1C 9.4     Assessment & Plan:  Gout of multiple sites, unspecified cause, unspecified chronicity - Plan: Uric Acid  Obesity, Class III, BMI 40-49.9 (morbid obesity)  DM type 2, uncontrolled, with neuropathy - Plan: CBC with Differential/Platelet, Comprehensive metabolic panel, Lipid panel, PSA, POCT glycosylated  hemoglobin (Hb A1C), HYDROcodone-acetaminophen (NORCO) 10-325 MG per tablet, HYDROcodone-acetaminophen (NORCO) 10-325 MG per tablet, HYDROcodone-acetaminophen (NORCO) 10-325 MG per tablet  He will continue sliding scale approach with am/pm targets  Needs new glucometer and will call with type  Diabetic polyneuropathy associated with type 2 diabetes mellitus  Cont Gaba and HC Meds ordered this encounter  Medications  . HYDROcodone-acetaminophen (NORCO) 10-325 MG per tablet    Sig: Take 1 tablet by mouth every 4 (four) hours as needed.    Dispense:  180 tablet    Refill:  0  . HYDROcodone-acetaminophen (NORCO) 10-325 MG per tablet    Sig: Take 1 tablet by mouth every 4 (four) hours as needed. For 60 days after date signed    Dispense:  180 tablet    Refill:  0  . HYDROcodone-acetaminophen (NORCO) 10-325 MG per tablet    Sig: Take 1 tablet by mouth every 4 (four) hours as needed. For 30 d after date signed    Dispense:  180 tablet    Refill:  0    I have completed the patient encounter in its entirety as documented by the scribe, with editing by me where necessary. Robert P. Laney Pastor,  M.D.  Addend-labs Results for orders placed or performed in visit on 07/18/14  CBC with Differential/Platelet  Result Value Ref Range   WBC 8.2 4.0 - 10.5 K/uL   RBC 4.47 4.22 - 5.81 MIL/uL   Hemoglobin 12.7 (L) 13.0 - 17.0 g/dL   HCT 38.3 (L) 39.0 - 52.0 %   MCV 85.7 78.0 - 100.0 fL   MCH 28.4 26.0 - 34.0 pg   MCHC 33.2 30.0 - 36.0 g/dL   RDW 14.4 11.5 - 15.5 %   Platelets 198 150 - 400 K/uL   MPV 11.9 8.6 - 12.4 fL   Neutrophils Relative % 56 43 - 77 %   Neutro Abs 4.6 1.7 - 7.7 K/uL   Lymphocytes Relative 37 12 - 46 %   Lymphs Abs 3.0 0.7 - 4.0 K/uL   Monocytes Relative 5 3 - 12 %   Monocytes Absolute 0.4 0.1 - 1.0 K/uL   Eosinophils Relative 2 0 - 5 %   Eosinophils Absolute 0.2 0.0 - 0.7 K/uL   Basophils Relative 0 0 - 1 %   Basophils Absolute 0.0 0.0 - 0.1 K/uL   Smear Review Criteria for review not met   Comprehensive metabolic panel  Result Value Ref Range   Sodium 137 135 - 145 mEq/L   Potassium 4.9 3.5 - 5.3 mEq/L   Chloride 101 96 - 112 mEq/L   CO2 27 19 - 32 mEq/L   Glucose, Bld 283 (H) 70 - 99 mg/dL   BUN 23 6 - 23 mg/dL   Creat 1.13 0.50 - 1.35 mg/dL   Total Bilirubin 0.5 0.2 - 1.2 mg/dL   Alkaline Phosphatase 64 39 - 117 U/L   AST 12 0 - 37 U/L   ALT 12 0 - 53 U/L   Total Protein 7.0 6.0 - 8.3 g/dL   Albumin 4.0 3.5 - 5.2 g/dL   Calcium 9.6 8.4 - 10.5 mg/dL  Lipid panel  Result Value Ref Range   Cholesterol 234 (H) 0 - 200 mg/dL   Triglycerides 467 (H) <150 mg/dL   HDL 34 (L) >=40 mg/dL   Total CHOL/HDL Ratio 6.9 Ratio   VLDL NOT CALC 0 - 40 mg/dL   LDL Cholesterol NOT CALC 0 - 99 mg/dL  PSA  Result Value  Ref Range   PSA 0.07 <=4.00 ng/mL  Uric Acid  Result Value Ref Range   Uric Acid, Serum 6.9 4.0 - 7.8 mg/dL  POCT glycosylated hemoglobin (Hb A1C)  Result Value Ref Range   Hemoglobin A1C 9.4    He has been statin intolerant in yrs past/I'll ask him to try crestor

## 2014-07-19 LAB — COMPREHENSIVE METABOLIC PANEL
ALK PHOS: 64 U/L (ref 39–117)
ALT: 12 U/L (ref 0–53)
AST: 12 U/L (ref 0–37)
Albumin: 4 g/dL (ref 3.5–5.2)
BILIRUBIN TOTAL: 0.5 mg/dL (ref 0.2–1.2)
BUN: 23 mg/dL (ref 6–23)
CALCIUM: 9.6 mg/dL (ref 8.4–10.5)
CO2: 27 mEq/L (ref 19–32)
CREATININE: 1.13 mg/dL (ref 0.50–1.35)
Chloride: 101 mEq/L (ref 96–112)
Glucose, Bld: 283 mg/dL — ABNORMAL HIGH (ref 70–99)
Potassium: 4.9 mEq/L (ref 3.5–5.3)
Sodium: 137 mEq/L (ref 135–145)
Total Protein: 7 g/dL (ref 6.0–8.3)

## 2014-07-19 LAB — LIPID PANEL
Cholesterol: 234 mg/dL — ABNORMAL HIGH (ref 0–200)
HDL: 34 mg/dL — AB (ref >=40)
Total CHOL/HDL Ratio: 6.9 Ratio
Triglycerides: 467 mg/dL — ABNORMAL HIGH (ref <150)

## 2014-07-19 LAB — URIC ACID: Uric Acid, Serum: 6.9 mg/dL (ref 4.0–7.8)

## 2014-07-19 LAB — PSA: PSA: 0.07 ng/mL (ref <=4.00)

## 2014-07-25 ENCOUNTER — Encounter: Payer: Self-pay | Admitting: Internal Medicine

## 2014-10-17 ENCOUNTER — Ambulatory Visit (INDEPENDENT_AMBULATORY_CARE_PROVIDER_SITE_OTHER): Payer: Medicare Other | Admitting: Internal Medicine

## 2014-10-17 VITALS — BP 150/66 | HR 67 | Temp 98.5°F | Resp 16 | Ht 71.0 in | Wt 291.4 lb

## 2014-10-17 DIAGNOSIS — E785 Hyperlipidemia, unspecified: Secondary | ICD-10-CM | POA: Diagnosis not present

## 2014-10-17 DIAGNOSIS — E1140 Type 2 diabetes mellitus with diabetic neuropathy, unspecified: Secondary | ICD-10-CM

## 2014-10-17 DIAGNOSIS — J0100 Acute maxillary sinusitis, unspecified: Secondary | ICD-10-CM

## 2014-10-17 DIAGNOSIS — E1165 Type 2 diabetes mellitus with hyperglycemia: Secondary | ICD-10-CM | POA: Diagnosis not present

## 2014-10-17 DIAGNOSIS — IMO0002 Reserved for concepts with insufficient information to code with codable children: Secondary | ICD-10-CM

## 2014-10-17 LAB — LIPID PANEL
Cholesterol: 231 mg/dL — ABNORMAL HIGH (ref 0–200)
HDL: 29 mg/dL — ABNORMAL LOW (ref >=40)
TRIGLYCERIDES: 601 mg/dL — AB (ref <150)
Total CHOL/HDL Ratio: 8 Ratio

## 2014-10-17 LAB — COMPREHENSIVE METABOLIC PANEL
ALBUMIN: 3.8 g/dL (ref 3.5–5.2)
ALT: 13 U/L (ref 0–53)
AST: 13 U/L (ref 0–37)
Alkaline Phosphatase: 76 U/L (ref 39–117)
BUN: 20 mg/dL (ref 6–23)
CO2: 26 mEq/L (ref 19–32)
Calcium: 9 mg/dL (ref 8.4–10.5)
Chloride: 96 mEq/L (ref 96–112)
Creat: 1.13 mg/dL (ref 0.50–1.35)
GLUCOSE: 331 mg/dL — AB (ref 70–99)
POTASSIUM: 4.8 meq/L (ref 3.5–5.3)
SODIUM: 136 meq/L (ref 135–145)
TOTAL PROTEIN: 7.2 g/dL (ref 6.0–8.3)
Total Bilirubin: 0.4 mg/dL (ref 0.2–1.2)

## 2014-10-17 LAB — POCT GLYCOSYLATED HEMOGLOBIN (HGB A1C): Hemoglobin A1C: 10.3

## 2014-10-17 MED ORDER — HYDROCODONE-ACETAMINOPHEN 10-325 MG PO TABS: 1.0000 | ORAL_TABLET | ORAL | Status: DC | PRN

## 2014-10-17 MED ORDER — AMOXICILLIN 500 MG PO CAPS: 1000.0000 mg | ORAL_CAPSULE | Freq: Two times a day (BID) | ORAL | Status: AC

## 2014-10-17 NOTE — Progress Notes (Addendum)
Subjective:    Patient ID: [name] Snow, male    DOB: 18-Jan-1966, 49 y.o.   MRN: [id number] This chart was scribed for Ellamae Sia, MD by Jolene Provost, Medical Scribe. This patient was seen in Room 11 and the patient's care was started a 4:46 PM.  Chief Complaint  Patient presents with  . Medication Refill    Hydrocodone  . Nasal Congestion  . Headache    HPI HPI Comments: [name] Snow is a 49 y.o. male who presents to [facility] complaining of left sided nasal congestion for the last three months productive of dark green drainage with associated HA. Pt states he has never been able to breath out of the left nostril well. Pt states he had swelling on the left side of his face three days ago. Pt states there is a bad flavor in his mouth from the drainage, as well as a bad smell. No fever or sore throat. No cough.  Pt also states he had an episode of gout recently, but it resolved within one day.  He continues to alter his diabetes regimen based on what he perceives as symptoms or side effects. He is unable to afford Lantus and so has been using Nov 70/30. Instead of 55 units twice a day with titration till sugars were regulated he elected to use 3 doses a day of 25-35 units because of perceived problems with thirst and dry mouth affecting his ability to sleep if  full dose in the evening. Pt states his sugars have been running around 200. He continues to eat erratically with small meal 10 or 11 AM and a large meal at 8 or 9 PM and going to bed soon thereafter. He does not control carbohydrate her fat intake and continues to have at least one soda a day. No visual symptoms. No peripheral edema. No change in urination. No recent kidney stone. He is not mounted a weight loss program as instructed.  Pt states his neuropathy is somewhat improved. Main symptom is dysesthesia at night. Still requires pain medication at bedtime in particular.  Review of Systems  Constitutional: Positive for  fatigue. Negative for fever, chills and unexpected weight change.  HENT: Positive for congestion, rhinorrhea and sinus pressure.   Eyes: Negative for visual disturbance.  Respiratory: Negative for shortness of breath.   Cardiovascular: Negative for chest pain, palpitations and leg swelling.  Gastrointestinal: Negative for abdominal pain.  Genitourinary: Negative for difficulty urinating.  Neurological: Positive for headaches. Negative for light-headedness.       Objective:   Physical Exam  Constitutional: He is oriented to person, place, and time. He appears well-developed and well-nourished. No distress.  HENT:  Head: Normocephalic and atraumatic.  Right Ear: External ear normal.  Left Ear: External ear normal.  Mouth/Throat: Oropharynx is clear and moist.  Mild tenderness of the left sinuses. Purulent discharge on the left  Eyes: Conjunctivae and EOM are normal. Pupils are equal, round, and reactive to light. Left eye exhibits no discharge.  Neck: Neck supple. No thyromegaly present.  Cardiovascular: Normal rate, regular rhythm and normal heart sounds.   No murmur heard. Pulmonary/Chest: Effort normal and breath sounds normal. No respiratory distress.  Musculoskeletal: Normal range of motion.  Lymphadenopathy:    He has no cervical adenopathy.  Neurological: He is alert and oriented to person, place, and time. Coordination normal.  See last foot exam  Skin: Skin is warm and dry. He is not diaphoretic.  Psychiatric: He has a  normal mood and affect. His behavior is normal.  Nursing note and vitals reviewed.  Wt Readings from Last 3 Encounters:  10/17/14 291 lb 6.4 oz (132.178 kg)  07/18/14 292 lb 9.6 oz (132.722 kg)  06/17/14 297 lb (134.718 kg)   BP 150/66 mmHg  Pulse 67  Temp(Src) 98.5 F (36.9 C) (Oral)  Resp 16  Ht  (1.803 m)  Wt 291 lb 6.4 oz (132.178 kg)  BMI 40.66 kg/m2  SpO2 98%     Assessment & Plan:  DM type 2, uncontrolled, with neuropathy    Obesity, Class III, BMI 40-49.9 (morbid obesity)  Hyperlipidemia - statin intolerance  Acute maxillary sinusitis, recurrence not specified  Elevated blood pressure #secondary to illness  Meds ordered this encounter  Medications  . amoxicillin (AMOXIL) 500 MG capsule    Sig: Take 2 capsules (1,000 mg total) by mouth 2 (two) times daily.    Dispense:  40 capsule    Refill:  1  . HYDROcodone-acetaminophen (NORCO) 10-325 MG per tablet    Sig: Take 1 tablet by mouth every 4 (four) hours as needed.    Dispense:  180 tablet    Refill:  0  . HYDROcodone-acetaminophen (NORCO) 10-325 MG per tablet    Sig: Take 1 tablet by mouth every 4 (four) hours as needed. For 60 days after date signed    Dispense:  180 tablet    Refill:  0  . HYDROcodone-acetaminophen (NORCO) 10-325 MG per tablet    Sig: Take 1 tablet by mouth every 4 (four) hours as needed. For 30 d after date signed    Dispense:  180 tablet    Refill:  0   He cannot afford Lantus He cannot seem to maintain necessary lifestyle changes He refuses bariatric surgery He does not want referral to a diabetes specialist or to the Medical Center which we have done in the past without success Once again he is asked to titrate his insulin in 2 daily doses 12 hours apart with the intent of having a blood sugar below 120 before each dose of insulin He will restart metformin at supper and see if he can tolerate this from a GI standpoint-he has had past problems He is given an appointment for follow-up for 3 months so that all of his diabetes parameters can be rechecked rather than having him come in and out of sequence in the walk-in clinic  I have completed the patient encounter in its entirety as documented by the scribe, with editing by me where necessary. Clarke Peretz P. Merla Riches, M.D.  Add-labs 6/2 Results for orders placed or performed in visit on 10/17/14  Comprehensive metabolic panel  Result Value Ref Range   Sodium 136 135 - 145  mEq/L   Potassium 4.8 3.5 - 5.3 mEq/L   Chloride 96 96 - 112 mEq/L   CO2 26 19 - 32 mEq/L   Glucose, Bld 331 (H) 70 - 99 mg/dL   BUN 20 6 - 23 mg/dL   Creat 1.61 0.96 - 0.45 mg/dL   Total Bilirubin 0.4 0.2 - 1.2 mg/dL   Alkaline Phosphatase 76 39 - 117 U/L   AST 13 0 - 37 U/L   ALT 13 0 - 53 U/L   Total Protein 7.2 6.0 - 8.3 g/dL   Albumin 3.8 3.5 - 5.2 g/dL   Calcium 9.0 8.4 - 40.9 mg/dL  Lipid panel  Result Value Ref Range   Cholesterol 231 (H) 0 - 200 mg/dL   Triglycerides  601 (H) <150 mg/dL   HDL 29 (L) >=16>=40 mg/dL   Total CHOL/HDL Ratio 8.0 Ratio   VLDL NOT CALC 0 - 40 mg/dL   LDL Cholesterol NOT CALC 0 - 99 mg/dL  POCT glycosylated hemoglobin (Hb A1C)  Result Value Ref Range   Hemoglobin A1C 10.3    Needs to add tricor and change diet plus control hypergly

## 2014-10-19 ENCOUNTER — Telehealth: Payer: Self-pay

## 2014-10-19 MED ORDER — FENOFIBRATE 145 MG PO TABS: 145.0000 mg | ORAL_TABLET | Freq: Every day | ORAL | Status: DC

## 2014-10-19 NOTE — Telephone Encounter (Signed)
Dr Merla Richesoolittle wants pt to start taking Tricor for his TGs until his DM is under better control. I have the Rx that printed bc no pharm was listed in EPIC. Could not reach husband, phone was constantly busy, so called wife and she asked me to send Rx to [facility] so that she can p/up after work.

## 2014-10-19 NOTE — Addendum Note (Signed)
Addended by: Tonye PearsonOLITTLE, Sabastien Tyler P on: 10/19/2014 01:06 PM   Modules accepted: Orders

## 2014-12-15 ENCOUNTER — Ambulatory Visit (INDEPENDENT_AMBULATORY_CARE_PROVIDER_SITE_OTHER): Payer: Medicare Other | Admitting: Internal Medicine

## 2014-12-15 VITALS — BP 159/79 | HR 67 | Temp 98.1°F | Resp 16 | Ht 70.0 in | Wt 291.0 lb

## 2014-12-15 DIAGNOSIS — I10 Essential (primary) hypertension: Secondary | ICD-10-CM

## 2014-12-15 DIAGNOSIS — Z9189 Other specified personal risk factors, not elsewhere classified: Secondary | ICD-10-CM

## 2014-12-15 DIAGNOSIS — E1140 Type 2 diabetes mellitus with diabetic neuropathy, unspecified: Secondary | ICD-10-CM

## 2014-12-15 DIAGNOSIS — I7090 Unspecified atherosclerosis: Secondary | ICD-10-CM

## 2014-12-15 DIAGNOSIS — E1165 Type 2 diabetes mellitus with hyperglycemia: Secondary | ICD-10-CM

## 2014-12-15 DIAGNOSIS — IMO0002 Reserved for concepts with insufficient information to code with codable children: Secondary | ICD-10-CM

## 2014-12-15 MED ORDER — HYDROCODONE-ACETAMINOPHEN 10-325 MG PO TABS: 1.0000 | ORAL_TABLET | ORAL | Status: DC | PRN

## 2014-12-15 MED ORDER — ATORVASTATIN CALCIUM 20 MG PO TABS: 20.0000 mg | ORAL_TABLET | Freq: Every day | ORAL | Status: DC

## 2014-12-15 MED ORDER — GABAPENTIN 400 MG PO CAPS: ORAL_CAPSULE | ORAL | Status: DC

## 2014-12-15 MED ORDER — LISINOPRIL 10 MG PO TABS: 10.0000 mg | ORAL_TABLET | Freq: Every day | ORAL | Status: DC

## 2014-12-15 NOTE — Progress Notes (Signed)
Subjective:    Patient ID: [name] Snow, male    DOB: June 16, 1965, 49 y.o.   MRN: [id number] This chart was scribed for Ellamae Sia, MD by Jolene Provost, Medical Scribe. This patient was seen in Room 5 and the patient's care was started a 1:50 PM.  Chief Complaint  Patient presents with  . Medication Refill    HPI HPI Comments: [name] Snow is a 49 y.o. male with a hx of DM and diabetic neuropathy who presents to [facility] reporting for a medication refill. Pt states his blood pressure has been around 130/80, until recently. Pt is taking 50 lantis in the morning and 50 in the evening. Pt is checking his blood sugar at home-but only occasionally, and in the morning it runs 220-230.   Patient Active Problem List   Diagnosis Date Noted  . Hyperlipidemia 10/17/2014  . Arteriosclerosis--advanced for age noted on CT  06/17/2014  . Lung nodule-CT 3/15--stable compared to earlier exam  06/17/2014  . Hepatomegaly-steatosis--- noted on CT  08/08/2013  . DM neuropathy, painful 10/19/2011  . Hip pain, chronic--- now status post replacement  10/19/2011  . Onychomycosis 10/19/2011  . Nephrolithiasis 10/19/2011  . Carpal tunnel syndrome 10/19/2011  . Obesity, Class III, BMI 40-49.9 (morbid obesity) 04/30/2011  . DM type 2, uncontrolled, with neuropathy 04/30/2011  . Gout--- has not had an attack in many months  04/30/2011  . 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 He is unsteady when he closes  his eyes. He has not tried to lance his insulin based on blood sugars as directed. He has not taken lisinopril. He has had some past problems with statins but mainly was unwilling to take any thing at that point. With his current lifestyle and his long family history of problems he has a very difficult time with compliance issues.  Review of Systems  Constitutional: Negative  for fever and chills.  HENT: Negative for trouble swallowing.   Eyes: Negative for visual disturbance.  Respiratory:       No clear dyspnea on exertion but he has noted recent easy fatigability  Cardiovascular: Negative for palpitations and leg swelling.       No history of angina  Gastrointestinal: Negative for abdominal pain.  Genitourinary: Negative for difficulty urinating.  Neurological: Positive for weakness and numbness. Negative for headaches.       Objective:   Physical Exam  Constitutional: He is oriented to person, place, and time. He appears well-developed and well-nourished. No distress.  HENT:  Head: Normocephalic and atraumatic.  Eyes: EOM are normal. Pupils are equal, round, and reactive to light.  Neck: Neck supple.  Cardiovascular: Normal rate, regular rhythm, normal heart sounds and intact distal pulses.   No murmur heard. Pulmonary/Chest: Effort normal. No respiratory distress.  Musculoskeletal: Normal range of motion. He exhibits no edema.  Neurological: He is alert and oriented to person, place, and time. Coordination normal.  decreased sensation to fine touch over the plantar surfaces  Skin: Skin is warm and dry. He is not diaphoretic.  Psychiatric: He has a normal mood and affect. His behavior is normal.  Nursing note and vitals reviewed. BP 159/79 mmHg  Pulse 67  Temp(Src) 98.1 F (36.7 C)  Resp 16  Ht  (1.778 m)  Wt 291 lb (131.997 kg)  BMI 41.75 kg/m2  Assessment & Plan:  Essential hypertension - Plan: Restart lisinopril (PRINIVIL,ZESTRIL) 10 MG tablet  DM type 2, uncontrolled, with neuropathy -  Plan: HYDROcodone-acetaminophen (NORCO) 10-325 MG per tablet,  gabapentin (NEURONTIN) 400 MG capsule  Increase insulin according to blood sugars taken before breakfast and before dinner  We discussed the need him serious about control issues in order to prevent secondary complications  Restart statin  Arterial sclerosis noted on CT is  advanced for age  He has significant cardiac risk factors and high likelihood of problems and will be referred for cardiac evaluation, perhaps  stress testing  Meds ordered this encounter  Medications  . lisinopril (PRINIVIL,ZESTRIL) 10 MG tablet    Sig: Take 1 tablet (10 mg total) by mouth daily.    Dispense:  90 tablet    Refill:  1  . HYDROcodone-acetaminophen (NORCO) 10-325 MG per tablet    Sig: Take 1 tablet by mouth every 4 (four) hours as needed.    Dispense:  180 tablet    Refill:  0  . HYDROcodone-acetaminophen (NORCO) 10-325 MG per tablet    Sig: Take 1 tablet by mouth every 4 (four) hours as needed. For 30 d after date signed    Dispense:  180 tablet    Refill:  0  . HYDROcodone-acetaminophen (NORCO) 10-325 MG per tablet    Sig: Take 1 tablet by mouth every 4 (four) hours as needed. For 60 days after date signed    Dispense:  180 tablet    Refill:  0  . gabapentin (NEURONTIN) 400 MG capsule    Sig: One am, one midday, and 3 hs    Dispense:  150 capsule    Refill:  11  . atorvastatin (LIPITOR) 20 MG tablet    Sig: Take 1 tablet (20 mg total) by mouth daily.    Dispense:  90 tablet    Refill:  3   He will need referral for eye exam at follow-up He has an appointment March 21, 2015 at which time labs will be done I have completed the patient encounter in its entirety as documented by the scribe, with editing by me where necessary. Niema Carrara P. Merla Riches, M.D.

## 2014-12-21 ENCOUNTER — Ambulatory Visit (INDEPENDENT_AMBULATORY_CARE_PROVIDER_SITE_OTHER): Payer: Medicare Other | Admitting: Family Medicine

## 2014-12-21 VITALS — BP 130/72 | HR 58 | Temp 98.2°F | Resp 16 | Ht 71.0 in | Wt 287.4 lb

## 2014-12-21 DIAGNOSIS — G63 Polyneuropathy in diseases classified elsewhere: Secondary | ICD-10-CM | POA: Diagnosis not present

## 2014-12-21 DIAGNOSIS — L02612 Cutaneous abscess of left foot: Secondary | ICD-10-CM | POA: Diagnosis not present

## 2014-12-21 DIAGNOSIS — S91302A Unspecified open wound, left foot, initial encounter: Secondary | ICD-10-CM

## 2014-12-21 DIAGNOSIS — E1349 Other specified diabetes mellitus with other diabetic neurological complication: Secondary | ICD-10-CM

## 2014-12-21 DIAGNOSIS — IMO0002 Reserved for concepts with insufficient information to code with codable children: Secondary | ICD-10-CM

## 2014-12-21 DIAGNOSIS — Z23 Encounter for immunization: Secondary | ICD-10-CM

## 2014-12-21 DIAGNOSIS — E1365 Other specified diabetes mellitus with hyperglycemia: Principal | ICD-10-CM

## 2014-12-21 LAB — POCT CBC
Granulocyte percent: 65.1 %G (ref 37–80)
HEMATOCRIT: 36.5 % — AB (ref 43.5–53.7)
HEMOGLOBIN: 11.8 g/dL — AB (ref 14.1–18.1)
LYMPH, POC: 3.5 — AB (ref 0.6–3.4)
MCH: 27.9 pg (ref 27–31.2)
MCHC: 32.5 g/dL (ref 31.8–35.4)
MCV: 85.9 fL (ref 80–97)
MID (CBC): 0.2 (ref 0–0.9)
MPV: 8.1 fL (ref 0–99.8)
PLATELET COUNT, POC: 264 10*3/uL (ref 142–424)
POC GRANULOCYTE: 6.9 (ref 2–6.9)
POC LYMPH PERCENT: 32.6 %L (ref 10–50)
POC MID %: 2.3 %M (ref 0–12)
RBC: 4.25 M/uL — AB (ref 4.69–6.13)
RDW, POC: 14.2 %
WBC: 10.6 10*3/uL — AB (ref 4.6–10.2)

## 2014-12-21 LAB — GLUCOSE, POCT (MANUAL RESULT ENTRY): POC Glucose: 248 mg/dl — AB (ref 70–99)

## 2014-12-21 MED ORDER — CLINDAMYCIN HCL 300 MG PO CAPS: 300.0000 mg | ORAL_CAPSULE | Freq: Four times a day (QID) | ORAL | Status: DC

## 2014-12-21 MED ORDER — CEFTRIAXONE SODIUM 1 G IJ SOLR
1.0000 g | Freq: Once | INTRAMUSCULAR | Status: AC
  Administered 2014-12-21: 1 g via INTRAMUSCULAR

## 2014-12-21 NOTE — Progress Notes (Addendum)
Subjective:  This chart was scribed for [name] Parish, MD by Leandra Kern, Medical Scribe. This patient was seen in Room 13 and the patient's care was started at 1:58 PM.   Patient ID: [name] Snow, male    DOB: 1965/07/28, 49 y.o.   MRN: [id number]  HPI HPI Comments: [name] Snow is a 49 y.o. male who presents to [facility] complaining of foot swelling. Pt has a hx of gout, DM 2. Last uric acid level for his Gout was 6.9 in March of this year. Pt also has history of uncontrolled diabetes with A1C of 10.3 in May 31st.  Insulin dosages were to be adjusted after discussion at most recent OV with Dr. Laney Pastor.  Pt notes that his symptoms started about 6 days ago, where he started experiencing symptoms of foot swelling. He states that he went on a trip to the [place] where he was wearing crocs the whole time. He reports that an hour after coming back from the beach he experienced swelling on his left foot, followed then by his right foot. Pt also noted infection to his left foot onset 3 days ago. He indicates that the area had swelling and soreness past 2 days. He does not believe that he was bit a jelly fish or any known wounds while at the beach..  Pt denies having calf pain, fevers.  Possible slight chills earlier, but no fever.  Pt states that he has peripheral neuropathy where he has a low sensation in his lower extremities as a result, and is not experiencing pain in his foot.  Did notice a crack in the skin feels like a split in the skin on his foot, but is unable to visualize this area.  Noticed more redness and swelling in the area the past 2 days, with some pus or discharge yesterday.   Pt notes that his diabetes has not been very controlled. He indicates that it usually ranges in the 230's, however it has been in the 300'. Pt reports that it has never been below 200's. He notes that his glucometer needs to be replaced.    Patient Active Problem List   Diagnosis Date Noted  . Hyperlipidemia 10/17/2014  . Arteriosclerosis 06/17/2014  . Lung nodule-CT 3/15 06/17/2014  . Hepatomegaly-steatosis 08/08/2013  . DM neuropathy, painful 10/19/2011  . Hip pain, chronic 10/19/2011  . Onychomycosis 10/19/2011  . Nephrolithiasis 10/19/2011  . Carpal tunnel syndrome 10/19/2011  . Obesity, Class III, BMI 40-49.9 (morbid obesity) 04/30/2011  . DM type 2, uncontrolled, with neuropathy 04/30/2011  . Gout 04/30/2011  . 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 Cataract   . Clotting disorder   . CHF (congestive heart failure)   . Chronic kidney disease   . Hypertension   . Diabetes mellitus   . Arthritis     gout   Past Surgical History  Procedure Laterality Date  . Joint replacement  020112    R Hip   No Known Allergies Prior to Admission medications   Medication Sig Start Date End Date Taking? Authorizing Provider  atorvastatin (LIPITOR) 20 MG tablet Take 1 tablet (20 mg total) by mouth daily. 12/15/14  Yes Leandrew Koyanagi, MD  Blood Gluc Meter Disp-Strips (BLOOD GLUCOSE METER DISPOSABLE) DEVI 1 strip by Percutaneous route 2 (two) times daily before a meal. Test blood sugar once a day as directed. Dx Code: 250.00 01/30/14  Yes Leandrew Koyanagi,  MD  Blood Glucose Monitoring Suppl (BLOOD GLUCOSE MONITOR KIT) KIT Use as directed 12/09/12  Yes Leandrew Koyanagi, MD  gabapentin (NEURONTIN) 400 MG capsule One am, one midday, and 3 hs 12/15/14  Yes Leandrew Koyanagi, MD  HYDROcodone-acetaminophen (NORCO) 10-325 MG per tablet Take 1 tablet by mouth every 4 (four) hours as needed. 12/15/14  Yes Leandrew Koyanagi, MD  HYDROcodone-acetaminophen (NORCO) 10-325 MG per tablet Take 1 tablet by mouth every 4 (four) hours as needed. For 30 d after date signed 12/15/14  Yes Leandrew Koyanagi, MD  HYDROcodone-acetaminophen [facility] 10-325 MG per tablet Take 1 tablet by mouth every 4 (four) hours as needed. For 60 days after date  signed 12/15/14  Yes Leandrew Koyanagi, MD  insulin NPH-regular Human (NOVOLIN 70/30) (70-30) 100 UNIT/ML injection Inject 55 Units into the skin 2 (two) times daily with a meal. Dose will be escalating 01/30/14  Yes Leandrew Koyanagi, MD  Insulin Syringe-Needle U-100 (INSULIN SYRINGE 1CC/31GX5/16") 31G X 5/16" 1 ML MISC Use as directed once a day. Dx Code: 250.00 01/30/14  Yes Leandrew Koyanagi, MD  Lancets MISC Use as directed once day. Dx Code: 250.00 01/30/14  Yes Leandrew Koyanagi, MD  lisinopril (PRINIVIL,ZESTRIL) 10 MG tablet Take 1 tablet (10 mg total) by mouth daily. 12/15/14  Yes Leandrew Koyanagi, MD   History   Social History  . Marital Status: Married    Spouse Name: N/A  . Number of Children: N/A  . Years of Education: N/A   Occupational History  . Not on file.   Social History Main Topics  . Smoking status: Never Smoker   . Smokeless tobacco: Never Used  . Alcohol Use: No  . Drug Use: No  . Sexual Activity: Yes    Birth Control/ Protection: None   Other Topics Concern  . Not on file   Social History Narrative    Review of Systems  Constitutional: Negative for fever and chills.  Cardiovascular: Positive for leg swelling.  Musculoskeletal: Negative for myalgias (calf).      Objective:   Physical Exam  Constitutional: He is oriented to person, place, and time. He appears well-developed and well-nourished. No distress.  HENT:  Head: Normocephalic and atraumatic.  Eyes: EOM are normal. Pupils are equal, round, and reactive to light.  Neck: Neck supple.  Cardiovascular: Normal rate.   Cap refill is less than 1 second. DP pulse was 2+.  Pulmonary/Chest: Effort normal.  Musculoskeletal:  Non tender in the feet, lower extremity edema 1+, left greater than right.   5th toe there is some soft tissue swelling.    Neurological: He is alert and oriented to person, place, and time. No cranial nerve deficit.  Skin: Skin is warm and dry.  Left foot: minimal  erythema in the base of the second toe nail which is discolored and thickened. There is a split in the base of his foot below the fourth to fifth metatarsal head that extends to the interdigital spaces and the fourth and fifth toes. The base of the split is visualized. There is some wet appearing skin surrounding that skin and exudate between the fourth and fifth toes, and abscess at the dorsum of the fifth toe with erythema extending across the distal foot dorsally to mid-way up the left foot dorsally.   Psychiatric: He has a normal mood and affect. His behavior is normal.  Nursing note and vitals reviewed.   Filed Vitals:   12/21/14 1256  BP: 130/72  Pulse: 58  Temp: 98.2 F (36.8 C)  TempSrc: Oral  Resp: 16  Height: 5' 11" (1.803 m)  Weight: 287 lb 6.4 oz (130.364 kg)  SpO2: 99%   Results for orders placed or performed in visit on 12/21/14  POCT CBC  Result Value Ref Range   WBC 10.6 (A) 4.6 - 10.2 K/uL   Lymph, poc 3.5 (A) 0.6 - 3.4   POC LYMPH PERCENT 32.6 10 - 50 %L   MID (cbc) 0.2 0 - 0.9   POC MID % 2.3 0 - 12 %M   POC Granulocyte 6.9 2 - 6.9   Granulocyte percent 65.1 37 - 80 %G   RBC 4.25 (A) 4.69 - 6.13 M/uL   Hemoglobin 11.8 (A) 14.1 - 18.1 g/dL   HCT, POC 36.5 (A) 43.5 - 53.7 %   MCV 85.9 80 - 97 fL   MCH, POC 27.9 27 - 31.2 pg   MCHC 32.5 31.8 - 35.4 g/dL   RDW, POC 14.2 %   Platelet Count, POC 264 142 - 424 K/uL   MPV 8.1 0 - 99.8 fL  POCT glucose (manual entry)  Result Value Ref Range   POC Glucose 248 (A) 70 - 99 mg/dl       Assessment & Plan:   REYNALDO ROSSMAN is a 49 y.o. male DM (diabetes mellitus), secondary, uncontrolled, w/neurologic complic - Plan: POCT glucose (manual entry)  Abscess of foot including toes, left - Plan: POCT CBC, cefTRIAXone (ROCEPHIN) injection 1 g, Wound culture, clindamycin (CLEOCIN) 300 MG capsule, DISCONTINUED: clindamycin (CLEOCIN) 300 MG capsule  Wound, open, foot with complication, left, initial encounter - Plan: POCT  CBC, Tdap vaccine greater than or equal to 7yo IM  He has a history of diabetes that has been uncontrolled, now with wound on the bottom of his left foot that may have been a split in the skin from swelling, with secondary infection, cellulitis, and now appears to be early abscess into the distal foot between the fourth and fifth toes. Slight proximal erythema/cellulitis was outlined in the office today. Afebrile, but borderline WBC on CBC. Options discussed for treatment, but will try to treat at outpatient initially. Did discuss potential for inpatient treatment and wound care may be needed.  -Start with Rocephin 1 g IM today and clindamycin 300 mg every 6 hours for potential MRSA coverage.    -Wound culture obtained, advised to keep foot elevated, cover with Telfa or other absorbent bandage, clean with soap and water only and elevated as able.  -Repeat exam with Dr. Linna Darner tomorrow who also saw this wound today. Determine outpatient versus inpatient treatment on follow-up appointment tomorrow, and potential trimming of skin around the wound versus exploration of upper area of wound/abscess tomorrow (no deep abscess was appreciated on exam of upper foot today).   -For the uncontrolled diabetes, advised he will need higher doses of insulin is currently on. Can slowly increase by 2 units total over the course of the day and increase by 2 units every 2 days until his home readings are under 200. Recommended follow-up for diabetes within the next 6 weeks to see if further changes in his regimen are needed.  -RTC/ER precautions discussed  Meds ordered this encounter  Medications  . cefTRIAXone (ROCEPHIN) injection 1 g    Sig:     Order Specific Question:  Antibiotic Indication:    Answer:  Cellulitis  . DISCONTD: clindamycin (CLEOCIN) 300 MG capsule    Sig:  Take 1 capsule (300 mg total) by mouth 4 (four) times daily.    Dispense:  40 capsule    Refill:  0  . clindamycin (CLEOCIN) 300 MG capsule     Sig: Take 1 capsule (300 mg total) by mouth 4 (four) times daily.    Dispense:  40 capsule    Refill:  0   Patient Instructions  We will try to get ahead of this infection today with a shot of antibiotic, and start the pills one every 6 hours. Okay to wash area with soap and water, but do not put any peroxide or alcohol on it. Telfa bandage to keep area clean, and follow-up with Dr. Linna Darner tomorrow. Based on appearance tomorrow we'll decide whether we can treat this outpatient or whether you may need to be in the hospital to treat this with IV antibiotics.  For your diabetes, I suspect you will need to be on higher doses of insulin than currently. You can increase by 2 total units of insulin (1 at each dose) per day every 2 days until your blood sugars get under 200. Then stay at that dose.  Recommend follow-up for diabetes in the next 6 weeks to determine dosing changes if needed at that time.    Follow-up tomorrow with Dr. Linna Darner.  Return to the clinic or go to the nearest emergency room if any of your symptoms worsen or new symptoms occur.      I personally performed the services described in this documentation, which was scribed in my presence. The recorded information has been reviewed and considered, and addended by me as needed.

## 2014-12-21 NOTE — Patient Instructions (Signed)
We will try to get ahead of this infection today with a shot of antibiotic, and start the pills one every 6 hours. Okay to wash area with soap and water, but do not put any peroxide or alcohol on it. Telfa bandage to keep area clean, and follow-up with Dr. Alwyn Ren tomorrow. Based on appearance tomorrow we'll decide whether we can treat this outpatient or whether you may need to be in the hospital to treat this with IV antibiotics.  For your diabetes, I suspect you will need to be on higher doses of insulin than currently. You can increase by 2 total units of insulin (1 at each dose) per day every 2 days until your blood sugars get under 200. Then stay at that dose.  Recommend follow-up for diabetes in the next 6 weeks to determine dosing changes if needed at that time.    Follow-up tomorrow with Dr. Alwyn Ren.  Return to the clinic or go to the nearest emergency room if any of your symptoms worsen or new symptoms occur.

## 2014-12-22 ENCOUNTER — Ambulatory Visit (INDEPENDENT_AMBULATORY_CARE_PROVIDER_SITE_OTHER): Payer: Medicare Other | Admitting: Family Medicine

## 2014-12-22 VITALS — BP 140/70 | HR 53 | Temp 98.3°F | Resp 16 | Ht 70.0 in | Wt 287.0 lb

## 2014-12-22 DIAGNOSIS — B351 Tinea unguium: Secondary | ICD-10-CM | POA: Diagnosis not present

## 2014-12-22 DIAGNOSIS — E1142 Type 2 diabetes mellitus with diabetic polyneuropathy: Secondary | ICD-10-CM

## 2014-12-22 DIAGNOSIS — L03119 Cellulitis of unspecified part of limb: Secondary | ICD-10-CM

## 2014-12-22 DIAGNOSIS — L02619 Cutaneous abscess of unspecified foot: Secondary | ICD-10-CM

## 2014-12-22 MED ORDER — CEFTRIAXONE SODIUM 1 G IJ SOLR
1.0000 g | Freq: Once | INTRAMUSCULAR | Status: AC
  Administered 2014-12-22: 1 g via INTRAMUSCULAR

## 2014-12-22 NOTE — Progress Notes (Signed)
  Subjective:  Patient ID: [name] Snow, male    DOB: 1966/01/06  Age: 49 y.o. MRN: [id number]  Patient was here yesterday and treated for a cellulitis and abscess of his left foot between the fourth and fifth toes. He says is much better today. He was given shot of ceftriaxone yesterday. Please refer to Dr. Paralee Cancel note for additional details. He has not gotten the prescription for the clindamycin filled yet. He has significant peripheral neuropathy and doesn't have much sensation in all his feet. The swelling is improved some.   Objective:   2+ edema of ankle. Erythema much decreased on the dorsum of foot. The infected cracking the skin on the sole of the foot extends about 3 or more centimeters below the fourth fifth interspace, between the toes is infected, and the dorsum of the foot has an infected blistered area. As noted this is all much improved from yesterday though still draining.  Assessment & Plan:   Assessment: Diabetic cellulitis and abscess left foot Diabetic polyneuropathy  Plan: Given a second shot of ceftriaxone since it didn't so well.  Get him started on his clindamycin as soon as possible  [name], PA-C, will debride the area. Patient Instructions  Continue keeping clean and wound covered.  Return Sunday for recheck (*for Dr. Alwyn Ren)  Return anytime if worse  It is very important to get started on the oral anabolic today.     [name], MD 12/22/2014

## 2014-12-22 NOTE — Patient Instructions (Signed)
Continue keeping clean and wound covered.  Return Sunday for recheck (*for Dr. Alwyn Ren)  Return anytime if worse  It is very important to get started on the oral anabolic today.

## 2014-12-22 NOTE — Progress Notes (Signed)
Procedure:  Consent obtained.  No local anesthesia used due to peripheral neuropathy.  Necrotic tissue and callus debrided without disturbing intact skin.  There is an ulceration on the dorsum of the foot. There is a track where necrotic tissue is debrided.

## 2014-12-24 ENCOUNTER — Ambulatory Visit (INDEPENDENT_AMBULATORY_CARE_PROVIDER_SITE_OTHER): Payer: Medicare Other | Admitting: Family Medicine

## 2014-12-24 VITALS — BP 138/70 | HR 57 | Temp 98.0°F | Resp 18 | Ht 70.0 in | Wt 288.2 lb

## 2014-12-24 DIAGNOSIS — E1165 Type 2 diabetes mellitus with hyperglycemia: Secondary | ICD-10-CM | POA: Diagnosis not present

## 2014-12-24 DIAGNOSIS — L02612 Cutaneous abscess of left foot: Secondary | ICD-10-CM | POA: Diagnosis not present

## 2014-12-24 DIAGNOSIS — IMO0002 Reserved for concepts with insufficient information to code with codable children: Secondary | ICD-10-CM

## 2014-12-24 DIAGNOSIS — E1140 Type 2 diabetes mellitus with diabetic neuropathy, unspecified: Secondary | ICD-10-CM

## 2014-12-24 LAB — WOUND CULTURE: GRAM STAIN: NONE SEEN

## 2014-12-24 NOTE — Patient Instructions (Signed)
Continue current medications  Return on Friday to see Dr. Merla Riches or Dr. Alwyn Ren  Return sooner if worse at anytime  Talk a little cotton ball gauze between the toes and change it several times a day

## 2014-12-24 NOTE — Progress Notes (Signed)
  Subjective:  Patient ID: [name] Snow, male    DOB: 02-14-1966  Age: 49 y.o. MRN: [id number]  Patient is here for follow-up with regard to the diabetic foot ulcer. He feels like it is continued to improve. His blood sugars been running 200+. He does not have much pain, but he doesn't have any neuropathy. He has been washing it with soap and water as directed.   Objective:   Wound it appears much better. No erythema. The wound is still draining at the dorsal and ventral aspect of the webspace of the left fourth and fifth toes. The cracking the skin along the swollen the foot below that is almost healed up.  Culture grew strep G  Assessment & Plan:   Assessment:  Diabetic foot abscess Poorly controlled diabetes  Plan:  Continue good local treatment and return at anytime if worse, otherwise return on Friday for a recheck. I was contemplating sending him to the wound care center but it is doing so much better so much quicker than I expected we will hold off on making a referral. Concern still exists as to whether more of a abscess crater is deep to the webspace, but if it heals up completely I don't think we need to worry too much about that. If it is not healing up may need to see a surgeon even to consider further I&D    Patient Instructions  Continue current medications  Return on Friday to see Dr. Merla Riches or Dr. Alwyn Ren  Return sooner if worse at anytime  Talk a little cotton ball gauze between the toes and change it several times a day    Nicko Daher, MD 12/24/2014

## 2015-03-21 ENCOUNTER — Ambulatory Visit (INDEPENDENT_AMBULATORY_CARE_PROVIDER_SITE_OTHER): Payer: PPO | Admitting: Internal Medicine

## 2015-03-21 ENCOUNTER — Encounter: Payer: Self-pay | Admitting: Internal Medicine

## 2015-03-21 VITALS — BP 136/74 | HR 61 | Temp 98.0°F | Resp 16 | Ht 70.0 in | Wt 280.0 lb

## 2015-03-21 DIAGNOSIS — R5383 Other fatigue: Secondary | ICD-10-CM

## 2015-03-21 DIAGNOSIS — R300 Dysuria: Secondary | ICD-10-CM

## 2015-03-21 DIAGNOSIS — Z23 Encounter for immunization: Secondary | ICD-10-CM

## 2015-03-21 DIAGNOSIS — R160 Hepatomegaly, not elsewhere classified: Secondary | ICD-10-CM | POA: Diagnosis not present

## 2015-03-21 DIAGNOSIS — E1165 Type 2 diabetes mellitus with hyperglycemia: Secondary | ICD-10-CM | POA: Diagnosis not present

## 2015-03-21 DIAGNOSIS — E785 Hyperlipidemia, unspecified: Secondary | ICD-10-CM

## 2015-03-21 DIAGNOSIS — IMO0002 Reserved for concepts with insufficient information to code with codable children: Secondary | ICD-10-CM

## 2015-03-21 DIAGNOSIS — D649 Anemia, unspecified: Secondary | ICD-10-CM | POA: Diagnosis not present

## 2015-03-21 DIAGNOSIS — E1140 Type 2 diabetes mellitus with diabetic neuropathy, unspecified: Secondary | ICD-10-CM | POA: Diagnosis not present

## 2015-03-21 DIAGNOSIS — M25512 Pain in left shoulder: Secondary | ICD-10-CM | POA: Diagnosis not present

## 2015-03-21 DIAGNOSIS — Z1211 Encounter for screening for malignant neoplasm of colon: Secondary | ICD-10-CM

## 2015-03-21 LAB — CBC WITH DIFFERENTIAL/PLATELET
Basophils Absolute: 0 10*3/uL (ref 0.0–0.1)
Basophils Relative: 0 % (ref 0–1)
Eosinophils Absolute: 0.2 10*3/uL (ref 0.0–0.7)
Eosinophils Relative: 2 % (ref 0–5)
HEMATOCRIT: 36.7 % — AB (ref 39.0–52.0)
Hemoglobin: 12.2 g/dL — ABNORMAL LOW (ref 13.0–17.0)
LYMPHS ABS: 3.1 10*3/uL (ref 0.7–4.0)
LYMPHS PCT: 33 % (ref 12–46)
MCH: 28.7 pg (ref 26.0–34.0)
MCHC: 33.2 g/dL (ref 30.0–36.0)
MCV: 86.4 fL (ref 78.0–100.0)
MONO ABS: 0.5 10*3/uL (ref 0.1–1.0)
MONOS PCT: 5 % (ref 3–12)
MPV: 11.7 fL (ref 8.6–12.4)
Neutro Abs: 5.7 10*3/uL (ref 1.7–7.7)
Neutrophils Relative %: 60 % (ref 43–77)
Platelets: 219 10*3/uL (ref 150–400)
RBC: 4.25 MIL/uL (ref 4.22–5.81)
RDW: 14.4 % (ref 11.5–15.5)
WBC: 9.5 10*3/uL (ref 4.0–10.5)

## 2015-03-21 LAB — LIPID PANEL
CHOL/HDL RATIO: 6 ratio — AB (ref <=5.0)
Cholesterol: 192 mg/dL (ref 125–200)
HDL: 32 mg/dL — ABNORMAL LOW (ref >=40)
LDL CALC: 100 mg/dL (ref <130)
TRIGLYCERIDES: 300 mg/dL — AB (ref <150)
VLDL: 60 mg/dL — ABNORMAL HIGH (ref <30)

## 2015-03-21 LAB — POC MICROSCOPIC URINALYSIS (UMFC): Mucus: ABSENT

## 2015-03-21 LAB — POCT URINALYSIS DIP (MANUAL ENTRY)
Bilirubin, UA: NEGATIVE
Glucose, UA: 100 — AB
Ketones, POC UA: NEGATIVE
Nitrite, UA: NEGATIVE
PH UA: 5
SPEC GRAV UA: 1.025
Urobilinogen, UA: 0.2

## 2015-03-21 LAB — COMPREHENSIVE METABOLIC PANEL
ALBUMIN: 3.8 g/dL (ref 3.6–5.1)
ALK PHOS: 67 U/L (ref 40–115)
ALT: 10 U/L (ref 9–46)
AST: 11 U/L (ref 10–35)
BUN: 24 mg/dL (ref 7–25)
CALCIUM: 8.7 mg/dL (ref 8.6–10.3)
CO2: 23 mmol/L (ref 20–31)
Chloride: 104 mmol/L (ref 98–110)
Creat: 1.02 mg/dL (ref 0.70–1.33)
GLUCOSE: 235 mg/dL — AB (ref 65–99)
POTASSIUM: 4.6 mmol/L (ref 3.5–5.3)
Sodium: 136 mmol/L (ref 135–146)
Total Bilirubin: 0.5 mg/dL (ref 0.2–1.2)
Total Protein: 7 g/dL (ref 6.1–8.1)

## 2015-03-21 LAB — HEMOGLOBIN A1C: Hgb A1c MFr Bld: 9.2 % — AB (ref 4.0–6.0)

## 2015-03-21 LAB — POCT GLYCOSYLATED HEMOGLOBIN (HGB A1C): HEMOGLOBIN A1C: 9.2

## 2015-03-21 LAB — MICROALBUMIN, URINE: Microalb, Ur: 3.5 mg/dL

## 2015-03-21 MED ORDER — CLOTRIMAZOLE-BETAMETHASONE 1-0.05 % EX CREA: 1.0000 "application " | TOPICAL_CREAM | Freq: Two times a day (BID) | CUTANEOUS | Status: DC

## 2015-03-21 MED ORDER — HYDROCODONE-ACETAMINOPHEN 10-325 MG PO TABS: 1.0000 | ORAL_TABLET | ORAL | Status: DC | PRN
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
## 2015-03-23 ENCOUNTER — Encounter: Payer: Self-pay | Admitting: Family Medicine

## 2015-03-26 ENCOUNTER — Encounter: Payer: Self-pay | Admitting: Internal Medicine

## 2015-04-19 ENCOUNTER — Encounter: Payer: Self-pay | Admitting: Family Medicine

## 2015-04-27 ENCOUNTER — Telehealth: Payer: Self-pay

## 2015-04-27 NOTE — Telephone Encounter (Signed)
Pt's wife called in stating someone from here called her husband. I don't see where or why we would have called him. CB # [phone]

## 2015-05-19 ENCOUNTER — Ambulatory Visit (INDEPENDENT_AMBULATORY_CARE_PROVIDER_SITE_OTHER): Payer: PPO | Admitting: Internal Medicine

## 2015-05-19 VITALS — BP 118/66 | HR 69 | Temp 98.3°F | Resp 18 | Ht 71.0 in | Wt 271.4 lb

## 2015-05-19 DIAGNOSIS — B9620 Unspecified Escherichia coli [E. coli] as the cause of diseases classified elsewhere: Secondary | ICD-10-CM | POA: Diagnosis not present

## 2015-05-19 DIAGNOSIS — N390 Urinary tract infection, site not specified: Secondary | ICD-10-CM

## 2015-05-19 DIAGNOSIS — N179 Acute kidney failure, unspecified: Secondary | ICD-10-CM | POA: Diagnosis not present

## 2015-05-19 DIAGNOSIS — A4151 Sepsis due to Escherichia coli [E. coli]: Secondary | ICD-10-CM | POA: Diagnosis not present

## 2015-05-19 DIAGNOSIS — N200 Calculus of kidney: Secondary | ICD-10-CM | POA: Diagnosis not present

## 2015-05-19 LAB — COMPREHENSIVE METABOLIC PANEL
ALT: 11 U/L (ref 9–46)
AST: 12 U/L (ref 10–35)
Albumin: 3.2 g/dL — ABNORMAL LOW (ref 3.6–5.1)
Alkaline Phosphatase: 67 U/L (ref 40–115)
BILIRUBIN TOTAL: 0.4 mg/dL (ref 0.2–1.2)
BUN: 35 mg/dL — AB (ref 7–25)
CALCIUM: 8.4 mg/dL — AB (ref 8.6–10.3)
CO2: 25 mmol/L (ref 20–31)
CREATININE: 1.49 mg/dL — AB (ref 0.70–1.33)
Chloride: 100 mmol/L (ref 98–110)
GLUCOSE: 172 mg/dL — AB (ref 65–99)
Potassium: 4.9 mmol/L (ref 3.5–5.3)
Sodium: 136 mmol/L (ref 135–146)
TOTAL PROTEIN: 7.2 g/dL (ref 6.1–8.1)

## 2015-05-19 LAB — POCT URINALYSIS DIP (MANUAL ENTRY)
BILIRUBIN UA: NEGATIVE
BILIRUBIN UA: NEGATIVE
GLUCOSE UA: NEGATIVE
NITRITE UA: NEGATIVE
Spec Grav, UA: 1.015
Urobilinogen, UA: 0.2
pH, UA: 5

## 2015-05-19 LAB — POC MICROSCOPIC URINALYSIS (UMFC): Mucus: ABSENT

## 2015-05-19 LAB — POCT CBC
Granulocyte percent: 61.3 %G (ref 37–80)
HCT, POC: 31.7 % — AB (ref 43.5–53.7)
Hemoglobin: 10.6 g/dL — AB (ref 14.1–18.1)
Lymph, poc: 3.4 (ref 0.6–3.4)
MCH: 28.1 pg (ref 27–31.2)
MCHC: 33.5 g/dL (ref 31.8–35.4)
MCV: 83.8 fL (ref 80–97)
MID (CBC): 0.7 (ref 0–0.9)
MPV: 8.1 fL (ref 0–99.8)
PLATELET COUNT, POC: 276 10*3/uL (ref 142–424)
POC Granulocyte: 6.4 (ref 2–6.9)
POC LYMPH %: 32.2 % (ref 10–50)
POC MID %: 6.5 % (ref 0–12)
RBC: 3.78 M/uL — AB (ref 4.69–6.13)
RDW, POC: 14 %
WBC: 10.5 10*3/uL — AB (ref 4.6–10.2)

## 2015-05-19 NOTE — Progress Notes (Addendum)
Subjective:    Patient ID: [name] Snow, male    DOB: 1965/08/13, 49 y.o.   MRN: [id number] By signing my name below, I, Javier Dockerobert Ryan Halas, attest that this documentation has been prepared under the direction and in the presence of Ellamae Siaobert Doolittle, MD. Electronically Signed: Javier Dockerobert Ryan Halas, ER Scribe. 05/19/2015. 9:48 AM.  Chief Complaint  Patient presents with   Blood Culture   HPI HPI Comments: [name] Baleshomas G Mcwilliams is a 49 y.o. male [facility]. He had a recent visit to the ER due to hematuria and flank pain and was found to have sepsis and kidney failure after a kidney stone blocked his ureter. He became septic in the hospital with E. Coli in the blood stream. He was put on levequin in the ER. He had a stent placed in his kidney while in the hospital. He is still fatigued but his urine sx have improved since leaving the hospital. He has an appointment with his urologist this coming week, and needs a blood culture before he reports to the urologist.   PMH signif for uncontr DM/obesity with advancing complications  No Known Allergies   Review of Systems  Constitutional: Positive for fatigue.      Objective:  BP 118/66 mmHg   Pulse 69   Temp(Src) 98.3 F (36.8 C) (Oral)   Resp 18   Ht 5\' 11"  (1.803 m)   Wt 271 lb 6.4 oz (123.106 kg)   BMI 37.87 kg/m2   SpO2 98%  Physical Exam  Constitutional: He is oriented to person, place, and time. He appears well-developed and well-nourished. No distress.  HENT:  Head: Normocephalic and atraumatic.  Eyes: Pupils are equal, round, and reactive to light.  Neck: Neck supple.  Cardiovascular: Normal rate.   Pulmonary/Chest: Effort normal. No respiratory distress.  Musculoskeletal: Normal range of motion.  Neurological: He is alert and oriented to person, place, and time. Coordination normal.  Skin: Skin is warm and dry. He is not diaphoretic.  Psychiatric: He has a normal mood and affect. His  behavior is normal.  Nursing note and vitals reviewed.     Assessment & Plan:  Sepsis due to Escherichia coli (HCC) - Plan: POCT CBC, Culture, Blood, Single Set Only, CANCELED: Aerobic Culture  Acute renal failure, unspecified acute renal failure type (HCC) - Plan: POCT CBC, Comprehensive metabolic panel  E-coli UTI - Plan: POCT urinalysis dipstick, POCT Microscopic Urinalysis ([facility]), Urine culture, Culture, Blood, Single Set Only  Recurrent nephrolithiasis  Results for orders placed or performed in visit on 05/19/15  Urine culture  Result Value Ref Range   Colony Count 20,OOO COLONIES/ML    Organism ID, Bacteria Multiple bacterial morphotypes present, none    Organism ID, Bacteria predominant. Suggest appropriate recollection if     Organism ID, Bacteria clinically indicated.   Culture, Blood, Single Set Only  Result Value Ref Range   Preliminary Report Blood Culture received; No Growth to date;    Preliminary Report Culture will be held for 5 days before issuing    Preliminary Report a Final Negative report.   Comprehensive metabolic panel  Result Value Ref Range   Sodium 136 135 - 146 mmol/L   Potassium 4.9 3.5 - 5.3 mmol/L   Chloride 100 98 - 110 mmol/L   CO2 25 20 - 31 mmol/L   Glucose, Bld 172 (H) 65 - 99 mg/dL   BUN 35 (H) 7 - 25 mg/dL   Creat  1.49 (H) 0.70 - 1.33 mg/dL   Total Bilirubin 0.4 0.2 - 1.2 mg/dL   Alkaline Phosphatase 67 40 - 115 U/L   AST 12 10 - 35 U/L   ALT 11 9 - 46 U/L   Total Protein 7.2 6.1 - 8.1 g/dL   Albumin 3.2 (L) 3.6 - 5.1 g/dL   Calcium 8.4 (L) 8.6 - 10.3 mg/dL  POCT CBC  Result Value Ref Range   WBC 10.5 (A) 4.6 - 10.2 K/uL   Lymph, poc 3.4 0.6 - 3.4   POC LYMPH PERCENT 32.2 10 - 50 %L   MID (cbc) 0.7 0 - 0.9   POC MID % 6.5 0 - 12 %M   POC Granulocyte 6.4 2 - 6.9   Granulocyte percent 61.3 37 - 80 %G   RBC 3.78 (A) 4.69 - 6.13 M/uL   Hemoglobin 10.6 (A) 14.1 - 18.1 g/dL   HCT, POC 16.1 (A) 09.6 - 53.7 %   MCV 83.8 80 - 97 fL    MCH, POC 28.1 27 - 31.2 pg   MCHC 33.5 31.8 - 35.4 g/dL   RDW, POC 04.5 %   Platelet Count, POC 276 142 - 424 K/uL   MPV 8.1 0 - 99.8 fL  POCT urinalysis dipstick  Result Value Ref Range   Color, UA yellow yellow   Clarity, UA clear clear   Glucose, UA negative negative   Bilirubin, UA negative negative   Ketones, POC UA negative negative   Spec Grav, UA 1.015    Blood, UA large (A) negative   pH, UA 5.0    Protein Ur, POC trace (A) negative   Urobilinogen, UA 0.2    Nitrite, UA Negative Negative   Leukocytes, UA Trace (A) Negative  POCT Microscopic Urinalysis ([facility])  Result Value Ref Range   WBC,UR,HPF,POC Few (A) None WBC/hpf   RBC,UR,HPF,POC Many (A) None RBC/hpf   Bacteria Few (A) None, Too numerous to count   Mucus Absent Absent   Epithelial Cells, UR Per Microscopy Few (A) None, Too numerous to count cells/hpf    results will be faxed to Urologist Daljit S Caberwal for his f/u  Repeat dm eval 2/17  I have completed the patient encounter in its entirety as documented by the scribe, with editing by me where necessary. Robert P. Merla Riches, M.D.

## 2015-05-21 LAB — URINE CULTURE

## 2015-05-22 ENCOUNTER — Telehealth: Payer: Self-pay

## 2015-05-22 DIAGNOSIS — N23 Unspecified renal colic: Secondary | ICD-10-CM | POA: Diagnosis not present

## 2015-05-22 DIAGNOSIS — N1330 Unspecified hydronephrosis: Secondary | ICD-10-CM | POA: Diagnosis not present

## 2015-05-22 DIAGNOSIS — E109 Type 1 diabetes mellitus without complications: Secondary | ICD-10-CM | POA: Diagnosis not present

## 2015-05-22 DIAGNOSIS — N3020 Other chronic cystitis without hematuria: Secondary | ICD-10-CM | POA: Diagnosis not present

## 2015-05-22 DIAGNOSIS — N200 Calculus of kidney: Secondary | ICD-10-CM | POA: Diagnosis not present

## 2015-05-22 NOTE — Telephone Encounter (Signed)
[phone]

## 2015-05-22 NOTE — Telephone Encounter (Signed)
DR.  Farris Hasoolittle    [facility] is requesting the results of patients labs.   Please call [phone]

## 2015-05-23 NOTE — Telephone Encounter (Signed)
Can we send

## 2015-05-23 NOTE — Telephone Encounter (Signed)
Spoke with Neysa Bonitohristy at [facility]. She has the records.

## 2015-05-23 NOTE — Telephone Encounter (Signed)
Records already sent per Dr. Netta Corriganoolittle's request.

## 2015-05-24 ENCOUNTER — Telehealth: Payer: Self-pay

## 2015-05-24 NOTE — Telephone Encounter (Signed)
Thurston Holenne from [facility] is calling because she received a fax from Dr. Merla Richesoolittle requesting that the patient needs help with his diabetes. Thurston Holenne is calling because she would like to know specifically how she can help the patient and what Dr. Merla Richesoolittle needs her to do. Please call Thurston Holenne at her direct line. [phone]

## 2015-05-25 LAB — CULTURE, BLOOD, SINGLE SET ONLY: Organism ID, Bacteria: NO GROWTH

## 2015-05-30 ENCOUNTER — Telehealth: Payer: Self-pay

## 2015-05-30 NOTE — Telephone Encounter (Signed)
Dr. Henderson Newcomeroolittle  Spoke with Pt. And he stated his urologist stated that he needed to contact his PCP and be placed back on Allopurinol. Please Advise He wants it sent in to the [facility] in [place], [place] if it can be done

## 2015-05-31 ENCOUNTER — Telehealth: Payer: Self-pay | Admitting: Internal Medicine

## 2015-05-31 MED ORDER — ALLOPURINOL 100 MG PO TABS: ORAL_TABLET | ORAL | Status: DC

## 2015-05-31 NOTE — Telephone Encounter (Signed)
Restart allopurinol--check uric acid 4-6 weeks to see if dose increase needed

## 2015-05-31 NOTE — Telephone Encounter (Signed)
Spoke to pt and informed rx has been sent in.  

## 2015-06-01 DIAGNOSIS — E119 Type 2 diabetes mellitus without complications: Secondary | ICD-10-CM | POA: Diagnosis not present

## 2015-06-01 DIAGNOSIS — E1140 Type 2 diabetes mellitus with diabetic neuropathy, unspecified: Secondary | ICD-10-CM | POA: Diagnosis not present

## 2015-06-01 DIAGNOSIS — E785 Hyperlipidemia, unspecified: Secondary | ICD-10-CM | POA: Diagnosis not present

## 2015-06-01 DIAGNOSIS — Z79899 Other long term (current) drug therapy: Secondary | ICD-10-CM | POA: Diagnosis not present

## 2015-06-01 DIAGNOSIS — Z01818 Encounter for other preprocedural examination: Secondary | ICD-10-CM | POA: Diagnosis not present

## 2015-06-01 DIAGNOSIS — Z794 Long term (current) use of insulin: Secondary | ICD-10-CM | POA: Diagnosis not present

## 2015-06-01 DIAGNOSIS — N200 Calculus of kidney: Secondary | ICD-10-CM | POA: Diagnosis not present

## 2015-06-01 DIAGNOSIS — I10 Essential (primary) hypertension: Secondary | ICD-10-CM | POA: Diagnosis not present

## 2015-06-01 DIAGNOSIS — G4733 Obstructive sleep apnea (adult) (pediatric): Secondary | ICD-10-CM | POA: Diagnosis not present

## 2015-06-01 DIAGNOSIS — Z01810 Encounter for preprocedural cardiovascular examination: Secondary | ICD-10-CM | POA: Diagnosis not present

## 2015-06-01 DIAGNOSIS — G4730 Sleep apnea, unspecified: Secondary | ICD-10-CM | POA: Diagnosis not present

## 2015-06-01 DIAGNOSIS — N201 Calculus of ureter: Secondary | ICD-10-CM | POA: Diagnosis not present

## 2015-06-01 DIAGNOSIS — M109 Gout, unspecified: Secondary | ICD-10-CM | POA: Diagnosis not present

## 2015-06-02 NOTE — Telephone Encounter (Signed)
I had a long conversation with his health advocate at [facility] to ask for help arranging primary care for his diabetes close to home and also further consideration of chronic pain management

## 2015-06-03 NOTE — Telephone Encounter (Signed)
I called and we have a plan for helping him find PCP and endo near home--

## 2015-06-08 DIAGNOSIS — N23 Unspecified renal colic: Secondary | ICD-10-CM | POA: Diagnosis not present

## 2015-06-08 DIAGNOSIS — N3020 Other chronic cystitis without hematuria: Secondary | ICD-10-CM | POA: Diagnosis not present

## 2015-06-08 DIAGNOSIS — E1010 Type 1 diabetes mellitus with ketoacidosis without coma: Secondary | ICD-10-CM | POA: Diagnosis not present

## 2015-06-08 DIAGNOSIS — N200 Calculus of kidney: Secondary | ICD-10-CM | POA: Diagnosis not present

## 2015-06-08 DIAGNOSIS — N1330 Unspecified hydronephrosis: Secondary | ICD-10-CM | POA: Diagnosis not present

## 2015-06-13 DIAGNOSIS — Z960 Presence of urogenital implants: Secondary | ICD-10-CM | POA: Diagnosis not present

## 2015-06-13 DIAGNOSIS — N201 Calculus of ureter: Secondary | ICD-10-CM | POA: Diagnosis not present

## 2015-06-13 DIAGNOSIS — N2889 Other specified disorders of kidney and ureter: Secondary | ICD-10-CM | POA: Diagnosis not present

## 2015-06-18 ENCOUNTER — Telehealth: Payer: Self-pay | Admitting: Family Medicine

## 2015-06-18 NOTE — Telephone Encounter (Signed)
UNABLE TO GET AHOLD OF PATIENT.  WILL SCHEDULE HIS COLONOSCOPY AND FIND OUT ABOUT HIS EYE APPOINTMENT WHILE HE IS IN THE OFFICE.

## 2015-06-19 DIAGNOSIS — N23 Unspecified renal colic: Secondary | ICD-10-CM | POA: Diagnosis not present

## 2015-06-19 DIAGNOSIS — N3020 Other chronic cystitis without hematuria: Secondary | ICD-10-CM | POA: Diagnosis not present

## 2015-06-19 DIAGNOSIS — N1330 Unspecified hydronephrosis: Secondary | ICD-10-CM | POA: Diagnosis not present

## 2015-06-19 DIAGNOSIS — N200 Calculus of kidney: Secondary | ICD-10-CM | POA: Diagnosis not present

## 2015-06-19 DIAGNOSIS — E1010 Type 1 diabetes mellitus with ketoacidosis without coma: Secondary | ICD-10-CM | POA: Diagnosis not present

## 2015-06-26 ENCOUNTER — Telehealth: Payer: Self-pay

## 2015-06-26 NOTE — Telephone Encounter (Signed)
[name] Snow from [facility]) called for Dr Merla Riches to let him know that the case is now closed due to no contact from the patient.  She said the home number in the system rings and rings, but there is no voicemail and no one answers.  She said the cell phone number in the system is disconnected.  Because she cannot reach the patient, she had to close the case.  If he reaches out to [place] in the future, he can contact Anne at [phone] and she will reopen the case for him.  This message likely refers to the message from Dr Merla Riches on 06/02/15:  I had a long conversation with his health advocate at [facility] to ask for help arranging primary care for his diabetes close to home and also further consideration of chronic pain management

## 2015-06-27 ENCOUNTER — Ambulatory Visit (INDEPENDENT_AMBULATORY_CARE_PROVIDER_SITE_OTHER): Payer: PPO | Admitting: Internal Medicine

## 2015-06-27 ENCOUNTER — Encounter: Payer: Self-pay | Admitting: Internal Medicine

## 2015-06-27 ENCOUNTER — Other Ambulatory Visit: Payer: Self-pay | Admitting: *Deleted

## 2015-06-27 VITALS — BP 140/80 | HR 68 | Temp 98.0°F | Resp 16 | Ht 71.0 in | Wt 277.6 lb

## 2015-06-27 DIAGNOSIS — M1A49X0 Other secondary chronic gout, multiple sites, without tophus (tophi): Secondary | ICD-10-CM

## 2015-06-27 DIAGNOSIS — E785 Hyperlipidemia, unspecified: Secondary | ICD-10-CM

## 2015-06-27 DIAGNOSIS — M1049 Other secondary gout, multiple sites: Secondary | ICD-10-CM

## 2015-06-27 DIAGNOSIS — E1140 Type 2 diabetes mellitus with diabetic neuropathy, unspecified: Secondary | ICD-10-CM | POA: Diagnosis not present

## 2015-06-27 DIAGNOSIS — E1165 Type 2 diabetes mellitus with hyperglycemia: Secondary | ICD-10-CM | POA: Diagnosis not present

## 2015-06-27 DIAGNOSIS — I7090 Unspecified atherosclerosis: Secondary | ICD-10-CM

## 2015-06-27 DIAGNOSIS — R911 Solitary pulmonary nodule: Secondary | ICD-10-CM | POA: Diagnosis not present

## 2015-06-27 DIAGNOSIS — Z1211 Encounter for screening for malignant neoplasm of colon: Secondary | ICD-10-CM

## 2015-06-27 DIAGNOSIS — I10 Essential (primary) hypertension: Secondary | ICD-10-CM

## 2015-06-27 DIAGNOSIS — IMO0002 Reserved for concepts with insufficient information to code with codable children: Secondary | ICD-10-CM

## 2015-06-27 DIAGNOSIS — Z1159 Encounter for screening for other viral diseases: Secondary | ICD-10-CM

## 2015-06-27 DIAGNOSIS — M25519 Pain in unspecified shoulder: Secondary | ICD-10-CM | POA: Diagnosis not present

## 2015-06-27 LAB — CBC WITH DIFFERENTIAL/PLATELET
BASOS ABS: 0.1 10*3/uL (ref 0.0–0.1)
BASOS PCT: 1 % (ref 0–1)
EOS ABS: 0.2 10*3/uL (ref 0.0–0.7)
EOS PCT: 3 % (ref 0–5)
HCT: 36 % — ABNORMAL LOW (ref 39.0–52.0)
Hemoglobin: 11.6 g/dL — ABNORMAL LOW (ref 13.0–17.0)
LYMPHS ABS: 3.4 10*3/uL (ref 0.7–4.0)
Lymphocytes Relative: 41 % (ref 12–46)
MCH: 27.7 pg (ref 26.0–34.0)
MCHC: 32.2 g/dL (ref 30.0–36.0)
MCV: 85.9 fL (ref 78.0–100.0)
MPV: 11.2 fL (ref 8.6–12.4)
Monocytes Absolute: 0.5 10*3/uL (ref 0.1–1.0)
Monocytes Relative: 6 % (ref 3–12)
Neutro Abs: 4 10*3/uL (ref 1.7–7.7)
Neutrophils Relative %: 49 % (ref 43–77)
PLATELETS: 235 10*3/uL (ref 150–400)
RBC: 4.19 MIL/uL — ABNORMAL LOW (ref 4.22–5.81)
RDW: 15.7 % — AB (ref 11.5–15.5)
WBC: 8.2 10*3/uL (ref 4.0–10.5)

## 2015-06-27 LAB — POCT GLYCOSYLATED HEMOGLOBIN (HGB A1C): HEMOGLOBIN A1C: 8.7

## 2015-06-27 LAB — LIPID PANEL
CHOL/HDL RATIO: 6.4 ratio — AB (ref <=5.0)
CHOLESTEROL: 204 mg/dL — AB (ref 125–200)
HDL: 32 mg/dL — AB (ref >=40)
LDL CALC: 120 mg/dL (ref <130)
TRIGLYCERIDES: 258 mg/dL — AB (ref <150)
VLDL: 52 mg/dL — AB (ref <30)

## 2015-06-27 LAB — COMPREHENSIVE METABOLIC PANEL
ALT: 9 U/L (ref 9–46)
AST: 10 U/L (ref 10–35)
Albumin: 3.8 g/dL (ref 3.6–5.1)
Alkaline Phosphatase: 59 U/L (ref 40–115)
BUN: 27 mg/dL — AB (ref 7–25)
CHLORIDE: 104 mmol/L (ref 98–110)
CO2: 23 mmol/L (ref 20–31)
Calcium: 9.3 mg/dL (ref 8.6–10.3)
Creat: 1.24 mg/dL (ref 0.70–1.33)
GLUCOSE: 199 mg/dL — AB (ref 65–99)
POTASSIUM: 4.4 mmol/L (ref 3.5–5.3)
Sodium: 137 mmol/L (ref 135–146)
Total Bilirubin: 0.5 mg/dL (ref 0.2–1.2)
Total Protein: 7.4 g/dL (ref 6.1–8.1)

## 2015-06-27 LAB — URIC ACID: URIC ACID, SERUM: 7.1 mg/dL (ref 4.0–7.8)

## 2015-06-27 MED ORDER — ONETOUCH ULTRA SYSTEM W/DEVICE KIT: PACK | Status: DC

## 2015-06-27 MED ORDER — ONETOUCH DELICA LANCETS 33G MISC: Status: DC

## 2015-06-27 MED ORDER — HYDROCODONE-ACETAMINOPHEN 10-325 MG PO TABS: 1.0000 | ORAL_TABLET | ORAL | Status: DC | PRN

## 2015-06-27 MED ORDER — GLUCOSE BLOOD VI STRP: ORAL_STRIP | Status: DC

## 2015-06-27 MED ORDER — ALLOPURINOL 300 MG PO TABS: 300.0000 mg | ORAL_TABLET | Freq: Every day | ORAL | Status: DC

## 2015-06-27 NOTE — Progress Notes (Signed)
Subjective:    Patient ID: [name] Snow, male    DOB: 04-02-1966, 50 y.o.   MRN: [id number]  HPIF/U Patient Active Problem List   Diagnosis Date Noted  . Hyperlipidemia---not taking meds 10/17/2014  . Arteriosclerosis 06/17/2014  . Lung nodule-CT 3/15 at low risk for Br Ca 06/17/2014  . Hepatomegaly-steatosis 08/08/2013  . DM neuropathy, painful (HCC)--not taking lisinopr 10/19/2011  . Hip pain, chronic 10/19/2011  . Onychomycosis 10/19/2011  . Nephrolithiasis 10/19/2011  . Carpal tunnel syndrome 10/19/2011  . Obesity, Class III, BMI 40-49.9 (morbid obesity) ([place]) 04/30/2011  . DM type 2, uncontrolled, with neuropathy ([place]) 04/30/2011  . Gout 04/30/2011  . 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   GOUT: Off allopurinol cause felt it ppted gout attack--currently asympto 1 stone of uric acid see 12/16-sepsis  OBESITY: Wt Readings from Last 3 Encounters:  06/27/15 277 lb 9.6 oz (125.919 kg)  05/19/15 271 lb 6.4 oz (123.106 kg)  03/21/15 280 lb (127.007 kg)  goal 250 for now Still sodas  DM/NEUROP -pain meds -variable control sugars -maintains he feels better with less insulin -is starting to check sugars -needs new machine   Quality Metrics Intervention Frequency Notes Meaningful Use ACO  Blood Pressure Every Visit  Goal < 140/80 BP < 140/90 BP < 140/90  Dilated Eye Exam Annually     Foot Examination Annually  Every visit if PVD or neuropathy    Fasting serum lipid profile Annually  LDL-C < 100 mg/dL LDL-C < 100 mg/dL  A1C Every 3-6 months  A1c < 9 % A1c <  8%  Urine Alb/Cr ratio Annually     Pneumovax At Dx &  once >age 39  Wait 5 years between first dose and dose after 36.  22 yrs & older received vaccine  Influenza Annually   6 months & older received Flu vaccine October 1 - March 31.                     Diabetes QM Metrics Latest Ref Rng 06/27/2015 03/21/2015 03/21/2015 10/17/2014 07/18/2014  HbA1c - 8.7 9.2 9.2(A) 10.3 9.4  Microalb Not estab mg/dL - 3.5 - - -    LDL Cacl <130 mg/dL 120 100 - NOT CALC NOT CALC   BP& BMI  06/27/2015 05/19/2015 03/21/2015 12/24/2014 12/22/2014  BP 140/80 118/66 136/74 138/70 140/70  BMI 38.73 37.87 40.18 41.36 41.18     His preadolescent son just admitted with BS>1000 [place]  Stressed by no employment and massive medical bills plus caring for Mom who now lives with he and wife  Review of Systems  Constitutional: Negative for fatigue and unexpected weight change.  HENT: Negative for trouble swallowing.   Eyes: Negative for visual disturbance.  Respiratory: Negative for chest tightness and shortness of breath.   Cardiovascular: Negative for chest pain, palpitations and leg swelling.  Gastrointestinal: Negative for abdominal pain.  Genitourinary: Negative for dysuria and frequency.       Stone sxt resolved  Musculoskeletal: Positive for myalgias, back pain and arthralgias.       S/p hip repl stable LCPtype prob  Now c/o 6 mos of trouble with R shoulder--hurts sleeping/wakes him--can't lift the arm  Neurological: Negative for headaches.  Hematological: Does not bruise/bleed easily.  Psychiatric/Behavioral: Negative for dysphoric mood.       Irreg sleep but no obsv apnea       Objective:   Physical Exam  Constitutional: He appears well-developed and well-nourished. No distress.  overweight  HENT:  Mouth/Throat: Oropharynx is clear and moist.  Eyes: EOM are normal. Pupils are equal, round, and reactive to light.  Neck: Normal range of motion. No thyromegaly present.  Cardiovascular: Normal rate, regular rhythm, normal heart sounds and intact distal pulses.   No murmur heard. Pulmonary/Chest: Effort normal and breath sounds normal. No respiratory distress.  Musculoskeletal: He exhibits no edema.  Shoulder R=painful arc and pain Abduc ag resis Grip ok  Lymphadenopathy:    He has no cervical adenopathy.  Neurological: He is alert. He has normal reflexes. No cranial nerve deficit.  Diminished plantar sensa  w/out ulcer or loss of pulses  Psychiatric: He has a normal mood and affect. His behavior is normal.   BP 140/80 mmHg  Pulse 68  Temp(Src) 98 F (36.7 C) (Oral)  Resp 16  Ht '5\' 11"'$  (1.803 m)  Wt 277 lb 9.6 oz (125.919 kg)  BMI 38.73 kg/m2     Assessment & Plan:  Other secondary chronic gout of multiple sites - needs allopurinol to prevent gout and stones Will need to retart while on colchicine for 2-3 weeks   Need for hepatitis C screening test - Plan: Hepatitis C antibody  Obesity, Class III, BMI 40-49.9 (morbid obesity) (HCC)---is starting to lose/better diet now that son also has dm  DM type 2, uncontrolled, with neuropathy (HCC)--needs pain control/better dm control and asked to titrate insulin dose/still feels he can't afford Lantus  Hyperlipidemia - Plan: Lipid panel  Arthralgia of shoulder, unspecified laterality  -he agrees to restart liptor and lisinopril(knows dm protec not htn) Agrees to ref to cardiol and GI([facility]) again-failed to go last time due to hospitaliz for sepsis -will try to ref to Int Med nearer his home for f/u as I retire Meds ordered this encounter  Medications  . HYDROcodone-acetaminophen (NORCO) 10-325 MG tablet    Sig: Take 1 tablet by mouth every 4 (four) hours as needed.    Dispense:  180 tablet    Refill:  0  . HYDROcodone-acetaminophen (NORCO) 10-325 MG tablet    Sig: Take 1 tablet by mouth every 4 (four) hours as needed. For 30 d after date signed    Dispense:  180 tablet    Refill:  0  . HYDROcodone-acetaminophen (NORCO) 10-325 MG tablet    Sig: Take 1 tablet by mouth every 4 (four) hours as needed. For 60 days after date signed    Dispense:  180 tablet    Refill:  0  . allopurinol (ZYLOPRIM) 300 MG tablet    Sig: Take 1 tablet (300 mg total) by mouth daily.    Dispense:  90 tablet    Refill:  3  . atorvastatin (LIPITOR) 20 MG tablet    Sig: Take 1 tablet (20 mg total) by mouth daily.    Dispense:  90 tablet    Refill:  3  .  lisinopril (PRINIVIL,ZESTRIL) 10 MG tablet    Sig: Take 1 tablet (10 mg total) by mouth daily.    Dispense:  90 tablet    Refill:  1     Diabetes Health Maintenance Due  Topic Date Due  . OPHTHALMOLOGY EXAM ---he will consider 12/07/1973  . FOOT EXAM  12/15/2015  . HEMOGLOBIN A1C  12/25/2015   Diabetes QM Metrics Latest Ref Rng 06/27/2015 03/21/2015 03/21/2015 10/17/2014 07/18/2014  HbA1c - 8.7 9.2 9.2(A) 10.3 9.4  Microalb Not estab mg/dL - 3.5 - - -  LDL Cacl <130 mg/dL 120 100 - NOT CALC NOT CALC  Results for orders placed or performed in visit on 06/27/15  Uric Acid  Result Value Ref Range   Uric Acid, Serum 7.1 4.0 - 7.8 mg/dL  CBC with Differential/Platelet  Result Value Ref Range   WBC 8.2 4.0 - 10.5 K/uL   RBC 4.19 (L) 4.22 - 5.81 MIL/uL   Hemoglobin 11.6 (L) 13.0 - 17.0 g/dL   HCT 36.0 (L) 39.0 - 52.0 %   MCV 85.9 78.0 - 100.0 fL   MCH 27.7 26.0 - 34.0 pg   MCHC 32.2 30.0 - 36.0 g/dL   RDW 15.7 (H) 11.5 - 15.5 %   Platelets 235 150 - 400 K/uL   MPV 11.2 8.6 - 12.4 fL   Neutrophils Relative % 49 43 - 77 %   Neutro Abs 4.0 1.7 - 7.7 K/uL   Lymphocytes Relative 41 12 - 46 %   Lymphs Abs 3.4 0.7 - 4.0 K/uL   Monocytes Relative 6 3 - 12 %   Monocytes Absolute 0.5 0.1 - 1.0 K/uL   Eosinophils Relative 3 0 - 5 %   Eosinophils Absolute 0.2 0.0 - 0.7 K/uL   Basophils Relative 1 0 - 1 %   Basophils Absolute 0.1 0.0 - 0.1 K/uL   Smear Review Criteria for review not met   Comprehensive metabolic panel  Result Value Ref Range   Sodium 137 135 - 146 mmol/L   Potassium 4.4 3.5 - 5.3 mmol/L   Chloride 104 98 - 110 mmol/L   CO2 23 20 - 31 mmol/L   Glucose, Bld 199 (H) 65 - 99 mg/dL   BUN 27 (H) 7 - 25 mg/dL   Creat 1.24 0.70 - 1.33 mg/dL   Total Bilirubin 0.5 0.2 - 1.2 mg/dL   Alkaline Phosphatase 59 40 - 115 U/L   AST 10 10 - 35 U/L   ALT 9 9 - 46 U/L   Total Protein 7.4 6.1 - 8.1 g/dL   Albumin 3.8 3.6 - 5.1 g/dL   Calcium 9.3 8.6 - 10.3 mg/dL  Hepatitis C antibody    Result Value Ref Range   HCV Ab NEGATIVE NEGATIVE  Lipid panel  Result Value Ref Range   Cholesterol 204 (H) 125 - 200 mg/dL   Triglycerides 258 (H) <150 mg/dL   HDL 32 (L) >=40 mg/dL   Total CHOL/HDL Ratio 6.4 (H) <=5.0 Ratio   VLDL 52 (H) <30 mg/dL   LDL Cholesterol 120 <130 mg/dL  POCT glycosylated hemoglobin (Hb A1C)  Result Value Ref Range   Hemoglobin A1C 8.7

## 2015-06-28 LAB — HEPATITIS C ANTIBODY: HCV Ab: NEGATIVE

## 2015-06-29 ENCOUNTER — Encounter: Payer: Self-pay | Admitting: Internal Medicine

## 2015-06-29 DIAGNOSIS — M25519 Pain in unspecified shoulder: Secondary | ICD-10-CM | POA: Insufficient documentation

## 2015-06-29 MED ORDER — ATORVASTATIN CALCIUM 20 MG PO TABS: 20.0000 mg | ORAL_TABLET | Freq: Every day | ORAL | Status: DC

## 2015-06-29 MED ORDER — LISINOPRIL 10 MG PO TABS: 10.0000 mg | ORAL_TABLET | Freq: Every day | ORAL | Status: DC

## 2015-07-02 ENCOUNTER — Other Ambulatory Visit: Payer: Self-pay

## 2015-07-02 NOTE — Patient Outreach (Signed)
[facility]) Care Management  07/02/2015  [name] Snow 16-Jul-1965 [id number]   Telephone call  To patient regarding [facility] referral. Unable to reach patient or leave voice message due to phone only ringing.   PLAN:  RNCM will attempt 2nd telephone call to patient within 1 week.   George Ina RN,BSN,CCM [facility] Telephonic  [phone]

## 2015-07-03 ENCOUNTER — Other Ambulatory Visit: Payer: Self-pay

## 2015-07-03 NOTE — Patient Outreach (Signed)
[facility]) Care Management  07/03/2015  BETHANY CUMMING 11-Apr-1966 [id number]  Second telephone call to patient regarding [facility] referral. Unable to reach patient.  HIPAA compliant voice message left with call back phone number.   PLAN: RNCM will attempt 3rd telephone call to patient within 1 week.   George Ina RN,BSN,CCM [facility] Telephonic  [phone]

## 2015-07-04 ENCOUNTER — Other Ambulatory Visit: Payer: Self-pay

## 2015-07-04 NOTE — Patient Outreach (Signed)
[facility]) Care Management  07/04/2015  STEPHEN BARUCH 1966-04-02 [id number]   Telephone call to patient regarding [facility] referral.  Unable to reach patient. HIPAA compliant voice message left with call back phone number.   PLAN: RNCM will attempt 3rd telephone outreach within 1 week.   George Ina RN,BSN,CCM [facility] Telephonic  [phone]

## 2015-07-09 ENCOUNTER — Other Ambulatory Visit: Payer: Self-pay

## 2015-07-09 NOTE — Patient Outreach (Signed)
[facility]) Care Management  07/09/2015  [name] Snow March 12, 1966 [id number]   Third telephone call to patient regarding [facility] referral. Unable to reach patient or leave voice message.  Patient's phone only rings.  Called patients primary MD office. Office confirmed same number listed for patient. [phone].  RNCM contacted patients emergency contact phone number. Unable to reach emergency contact.  Message states mailbox has not been set up.  PLAN:  RNCM will send patient outreach letter to attempt contact.   George Ina RN,BSN,CCM [facility] Telephonic  [phone]

## 2015-07-23 ENCOUNTER — Other Ambulatory Visit: Payer: Self-pay

## 2015-07-23 NOTE — Patient Outreach (Signed)
[facility] Care Management  07/23/2015  [name] Snow 1966/03/27 [id number]   No response from patient to 3 telephone calls and letter outreach attempt. Telephone call to Ralene BatheAnn Marie Coley with [facility].  Left voice message requesting assistance with engaging [facility]. Notified Ms. Coley of inability to establish contact with patient.  Telephone call to patients listed primary MD office, Dr. Merla Richesoolittle. Confirmed patients contact phone number with M[facility].  Notified of inability to establish contact with patient to offer [facility] case management services.  PLAN: RNCM will refer patient to Sherle Poeicole Robinson to close due to inability to establish contact with patient. Patients primary MD notified by phone and letter of closure.   George InaDavina Giah Fickett RN,BSN,CCM [facility] Telephonic  [phone] '

## 2015-07-31 ENCOUNTER — Ambulatory Visit: Payer: PPO | Admitting: Cardiovascular Disease

## 2015-07-31 DIAGNOSIS — N3020 Other chronic cystitis without hematuria: Secondary | ICD-10-CM | POA: Diagnosis not present

## 2015-07-31 DIAGNOSIS — N200 Calculus of kidney: Secondary | ICD-10-CM | POA: Diagnosis not present

## 2015-07-31 DIAGNOSIS — N23 Unspecified renal colic: Secondary | ICD-10-CM | POA: Diagnosis not present

## 2015-07-31 DIAGNOSIS — E1010 Type 1 diabetes mellitus with ketoacidosis without coma: Secondary | ICD-10-CM | POA: Diagnosis not present

## 2015-07-31 DIAGNOSIS — N1330 Unspecified hydronephrosis: Secondary | ICD-10-CM | POA: Diagnosis not present

## 2015-08-17 ENCOUNTER — Encounter: Payer: PPO | Admitting: Cardiovascular Disease

## 2015-08-17 NOTE — Progress Notes (Signed)
This encounter was created in error - please disregard.

## 2015-08-21 ENCOUNTER — Encounter: Payer: Self-pay | Admitting: *Deleted

## 2015-08-24 ENCOUNTER — Encounter: Payer: Self-pay | Admitting: Cardiovascular Disease

## 2015-09-05 ENCOUNTER — Encounter: Payer: Self-pay | Admitting: Internal Medicine

## 2015-09-05 ENCOUNTER — Ambulatory Visit (INDEPENDENT_AMBULATORY_CARE_PROVIDER_SITE_OTHER): Payer: PPO | Admitting: Internal Medicine

## 2015-09-05 VITALS — BP 155/76 | HR 65 | Temp 98.7°F | Resp 16 | Ht 71.0 in | Wt 280.0 lb

## 2015-09-05 DIAGNOSIS — E1140 Type 2 diabetes mellitus with diabetic neuropathy, unspecified: Secondary | ICD-10-CM

## 2015-09-05 DIAGNOSIS — E1142 Type 2 diabetes mellitus with diabetic polyneuropathy: Secondary | ICD-10-CM

## 2015-09-05 DIAGNOSIS — E1165 Type 2 diabetes mellitus with hyperglycemia: Secondary | ICD-10-CM | POA: Diagnosis not present

## 2015-09-05 DIAGNOSIS — M109 Gout, unspecified: Secondary | ICD-10-CM | POA: Diagnosis not present

## 2015-09-05 DIAGNOSIS — E785 Hyperlipidemia, unspecified: Secondary | ICD-10-CM | POA: Diagnosis not present

## 2015-09-05 DIAGNOSIS — N200 Calculus of kidney: Secondary | ICD-10-CM

## 2015-09-05 DIAGNOSIS — IMO0002 Reserved for concepts with insufficient information to code with codable children: Secondary | ICD-10-CM

## 2015-09-05 DIAGNOSIS — R160 Hepatomegaly, not elsewhere classified: Secondary | ICD-10-CM | POA: Diagnosis not present

## 2015-09-05 LAB — POCT GLYCOSYLATED HEMOGLOBIN (HGB A1C): Hemoglobin A1C: 8.8

## 2015-09-05 MED ORDER — HYDROCODONE-ACETAMINOPHEN 10-325 MG PO TABS: 1.0000 | ORAL_TABLET | ORAL | Status: DC | PRN

## 2015-09-05 MED ORDER — GABAPENTIN 400 MG PO CAPS: ORAL_CAPSULE | ORAL | Status: DC

## 2015-09-05 NOTE — Patient Instructions (Signed)
     IF you received an x-ray today, you will receive an invoice from [facility]. Please contact  Radiology at [phone] with questions or concerns regarding your invoice.   IF you received labwork today, you will receive an invoice from [facility]. Please contact [facility] at [phone] with questions or concerns regarding your invoice.   Our billing staff will not be able to assist you with questions regarding bills from these companies.  You will be contacted with the lab results as soon as they are available. The fastest way to get your results is to activate your My Chart account. Instructions are located on the last page of this paperwork. If you have not heard from us regarding the results in 2 weeks, please contact this office.      

## 2015-09-06 NOTE — Progress Notes (Signed)
Subjective:    Patient ID: [name] Snow, male    DOB: 12-18-1965, 50 y.o.   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 This promptly led to a gout attack so he discontinued it. He has tried colchicine plus allopurinol but never managed to stay on allopurinol for any length of time. This has been another compliance issue.  He was given a connection to try health network social work to arrange for primary care and endocrine follow-up near his home but he failed to follow-up with her telephone calls and so she has stopped trying.  His life is now overburdened by his need to care for her invalid mother who is also mentally unstable and who is now living with him. This is creating great difficulties for his wife and his son. He feels guilty about putting her in an extended care facility even though the family is in favor of this.  Patient Active Problem List   Diagnosis Date Noted  . Pain in joint, shoulder region 06/29/2015  . Hyperlipidemia 10/17/2014  . Arteriosclerosis 06/17/2014  . Lung nodule-CT 3/15 06/17/2014  . Hepatomegaly-steatosis 08/08/2013  . DM neuropathy, painful (HCC) 10/19/2011  . Hip pain, chronic 10/19/2011  . Onychomycosis 10/19/2011  . Nephrolithiasis 10/19/2011  . Carpal tunnel syndrome 10/19/2011  . Obesity, Class III, BMI 40-49.9 (morbid obesity) (HCC) 04/30/2011  . DM type 2,  uncontrolled, with neuropathy (HCC) 04/30/2011  . Gout 04/30/2011  . 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 allopurinol (ZYLOPRIM) 300 MG tablet Take 1 tablet (300 mg total) by mouth daily. Not taking   . atorvastatin (LIPITOR) 20 MG tablet Take 1 tablet (20 mg total) by mouth daily. Taking irregularly   . gabapentin (NEURONTIN) 400 MG capsule One am, one midday, and 3 hs   . HYDROcodone-acetaminophen (NORCO) 10-325 MG tablet Take 1 tablet by mouth every 4 (four) hours as needed. For 60 days after date signed   . insulin NPH-regular Human (NOVOLIN 70/30) (70-30) 100 UNIT/ML injection Inject 55 Units into the skin 2 (two) times daily with a meal. Dose will be escalating Has not made dose adjustments   . lisinopril (PRINIVIL,ZESTRIL) 10 MG tablet Take 1 tablet (10 mg total) by mouth daily. Taking irregularly    He chooses to wait on pneumococcal vaccines   Review of Systems Continues with significant shoulder pain and is ready to consider orthopedic evaluation Not complaining of chest pain dyspnea on exertion or palpitations Not complaining of peripheral edema He continues to have significant joint pain although often due to chronic osteoarthritis rather than gout   continues with recurrent kidney stones Denies respiratory difficulties  Objective:   Physical Exam BP 155/76 mmHg  Pulse 65  Temp(Src) 98.7 F (37.1 C)  Resp 16  Ht 5\' 11"  (1.803 m)  Wt 280 lb (127.007 kg)  BMI 39.07 kg/m2 Repeat blood pressure 148/78 No acute distress HEENT clear  Heart regular Lungs clear Extremities without edema Good peripheral pulses Decreased sensory response plantar aspect both feet Musculoskeletal-left shoulder with painful arc Cranial nerves II through XII intact Gait stiff Mood good  Lab Results  Component Value Date   HGBA1C 8.8 09/05/2015        Assessment & Plan:  DM type 2, uncontrolled, with neuropathy  (HCC) - Still uncontrolled -Advised once again about diet -Advised insulin increase home monitoring -11/16 microalbumin 3.5  Hypertension--blood pressure uncontrolled -Advised compliance with medication with increased dose to 20 mg if not under 130/80 in the next 2-3 weeks -He needs cardiology evaluation with stress testing but he was angry when he got to Dr. Leonides Sake office and they were going to do an evaluation without doing a stress test that very day, and so he left  Gout of multiple sites, unspecified cause, unspecified chronicity--not currently active -He would benefit from daily allopurinol if he could go through the process of initiation -He needs better dietary control  Hepatomegaly-steatosis -Secondary to metabolic syndrome  Hyperlipidemia -His labs in February were not terrible but he needs to adhere to Lipitor and achieve weight loss  Obesity, Class III, BMI 40-49.9 (morbid obesity) (HCC)  Nephrolithiasis -Follow-up with urology  We discussed his home situation at length and his need to move his mom into more professional care  Meds ordered this encounter  Medications  . HYDROcodone-acetaminophen (NORCO) 10-325 MG tablet    Sig: Take 1 tablet by mouth every 4 (four) hours as needed. For 60 days after date signed    Dispense:  180 tablet    Refill:  0  . HYDROcodone-acetaminophen (NORCO) 10-325 MG tablet    Sig: Take 1 tablet by mouth every 4 (four) hours as needed. For 30 d after date signed    Dispense:  180 tablet    Refill:  0  . HYDROcodone-acetaminophen (NORCO) 10-325 MG tablet    Sig: Take 1 tablet by mouth every 4 (four) hours as needed.    Dispense:  180 tablet    Refill:  0  . gabapentin (NEURONTIN) 400 MG capsule    Sig: One am, one midday, and 3 hs    Dispense:  450 capsule    Refill:  3   He needs pneumococcal vaccine, ophthalmology exam, colonoscopy Known neuropathy with next foot Exam summer 2017 We discussed internal medicine referrals for  his community near [place]--Dr. Orvan Falconer, Dr.Uppin, Dr. Katheran Awe, Dr Leonia Reader and he will make the appointment

## 2015-09-11 DIAGNOSIS — E109 Type 1 diabetes mellitus without complications: Secondary | ICD-10-CM | POA: Diagnosis not present

## 2015-09-11 DIAGNOSIS — N200 Calculus of kidney: Secondary | ICD-10-CM | POA: Diagnosis not present

## 2015-09-11 DIAGNOSIS — N23 Unspecified renal colic: Secondary | ICD-10-CM | POA: Diagnosis not present

## 2015-09-11 DIAGNOSIS — N1330 Unspecified hydronephrosis: Secondary | ICD-10-CM | POA: Diagnosis not present

## 2015-09-11 DIAGNOSIS — N3021 Other chronic cystitis with hematuria: Secondary | ICD-10-CM | POA: Diagnosis not present

## 2015-11-06 DIAGNOSIS — N289 Disorder of kidney and ureter, unspecified: Secondary | ICD-10-CM | POA: Diagnosis not present

## 2015-11-06 DIAGNOSIS — E785 Hyperlipidemia, unspecified: Secondary | ICD-10-CM | POA: Diagnosis not present

## 2015-11-06 DIAGNOSIS — R5382 Chronic fatigue, unspecified: Secondary | ICD-10-CM | POA: Diagnosis not present

## 2015-11-06 DIAGNOSIS — M109 Gout, unspecified: Secondary | ICD-10-CM | POA: Diagnosis not present

## 2015-11-06 DIAGNOSIS — E669 Obesity, unspecified: Secondary | ICD-10-CM | POA: Diagnosis not present

## 2015-11-06 DIAGNOSIS — E119 Type 2 diabetes mellitus without complications: Secondary | ICD-10-CM | POA: Diagnosis not present

## 2015-11-06 DIAGNOSIS — Z6838 Body mass index (BMI) 38.0-38.9, adult: Secondary | ICD-10-CM | POA: Diagnosis not present

## 2015-11-06 DIAGNOSIS — I10 Essential (primary) hypertension: Secondary | ICD-10-CM | POA: Diagnosis not present

## 2015-11-06 DIAGNOSIS — M159 Polyosteoarthritis, unspecified: Secondary | ICD-10-CM | POA: Diagnosis not present

## 2015-11-12 DIAGNOSIS — E1140 Type 2 diabetes mellitus with diabetic neuropathy, unspecified: Secondary | ICD-10-CM | POA: Diagnosis not present

## 2015-11-12 DIAGNOSIS — Z6838 Body mass index (BMI) 38.0-38.9, adult: Secondary | ICD-10-CM | POA: Diagnosis not present

## 2015-11-12 DIAGNOSIS — M109 Gout, unspecified: Secondary | ICD-10-CM | POA: Diagnosis not present

## 2015-11-12 DIAGNOSIS — E785 Hyperlipidemia, unspecified: Secondary | ICD-10-CM | POA: Diagnosis not present

## 2015-11-12 DIAGNOSIS — N289 Disorder of kidney and ureter, unspecified: Secondary | ICD-10-CM | POA: Diagnosis not present

## 2015-11-12 DIAGNOSIS — I10 Essential (primary) hypertension: Secondary | ICD-10-CM | POA: Diagnosis not present

## 2015-11-12 DIAGNOSIS — E119 Type 2 diabetes mellitus without complications: Secondary | ICD-10-CM | POA: Diagnosis not present

## 2015-11-12 DIAGNOSIS — M159 Polyosteoarthritis, unspecified: Secondary | ICD-10-CM | POA: Diagnosis not present

## 2015-11-19 DIAGNOSIS — E1142 Type 2 diabetes mellitus with diabetic polyneuropathy: Secondary | ICD-10-CM | POA: Diagnosis not present

## 2015-11-19 DIAGNOSIS — M109 Gout, unspecified: Secondary | ICD-10-CM | POA: Diagnosis not present

## 2015-11-19 DIAGNOSIS — E119 Type 2 diabetes mellitus without complications: Secondary | ICD-10-CM | POA: Diagnosis not present

## 2015-11-19 DIAGNOSIS — G894 Chronic pain syndrome: Secondary | ICD-10-CM | POA: Diagnosis not present

## 2015-11-19 DIAGNOSIS — F1120 Opioid dependence, uncomplicated: Secondary | ICD-10-CM | POA: Diagnosis not present

## 2015-11-19 DIAGNOSIS — Z1389 Encounter for screening for other disorder: Secondary | ICD-10-CM | POA: Diagnosis not present

## 2015-11-23 ENCOUNTER — Ambulatory Visit (INDEPENDENT_AMBULATORY_CARE_PROVIDER_SITE_OTHER): Payer: PPO | Admitting: Physician Assistant

## 2015-11-23 VITALS — BP 104/68 | HR 70 | Temp 98.0°F | Resp 18 | Ht 71.0 in | Wt 272.6 lb

## 2015-11-23 DIAGNOSIS — E1142 Type 2 diabetes mellitus with diabetic polyneuropathy: Secondary | ICD-10-CM | POA: Diagnosis not present

## 2015-11-23 DIAGNOSIS — E1140 Type 2 diabetes mellitus with diabetic neuropathy, unspecified: Secondary | ICD-10-CM

## 2015-11-23 DIAGNOSIS — E1165 Type 2 diabetes mellitus with hyperglycemia: Secondary | ICD-10-CM

## 2015-11-23 DIAGNOSIS — E785 Hyperlipidemia, unspecified: Secondary | ICD-10-CM | POA: Diagnosis not present

## 2015-11-23 DIAGNOSIS — IMO0002 Reserved for concepts with insufficient information to code with codable children: Secondary | ICD-10-CM

## 2015-11-23 MED ORDER — LISINOPRIL 5 MG PO TABS: 5.0000 mg | ORAL_TABLET | Freq: Every day | ORAL | Status: DC

## 2015-11-23 MED ORDER — ROSUVASTATIN CALCIUM 20 MG PO TABS: 20.0000 mg | ORAL_TABLET | Freq: Every day | ORAL | Status: DC

## 2015-11-23 MED ORDER — HYDROCODONE-ACETAMINOPHEN 10-325 MG PO TABS: 1.0000 | ORAL_TABLET | ORAL | Status: DC | PRN

## 2015-11-23 MED ORDER — METFORMIN HCL ER 500 MG PO TB24: 1000.0000 mg | ORAL_TABLET | Freq: Two times a day (BID) | ORAL | Status: DC

## 2015-11-23 NOTE — Patient Instructions (Addendum)
Lisinopril 5 mg 1 a day - check BP if >130/80 increase to Lisinopril 10mg   Increase gabapentin to 400mg  in the am for 3 days then increase afternoon dose to 800mg  if tolerating in 3days - then increase am dose to 1200mg  and then in 3 days increase afternoon dose to 1200mg  - goal is Gabapentin 1200mg  3x/day  Continue norco as Written -

## 2015-11-23 NOTE — Progress Notes (Signed)
[name] Snow  MRN: [id number] DOB: Aug 23, 1965  Subjective:  Pt presents to clinic for medication problems.  He is a new patient to me.  He has a complicated past medical history with poorly controlled DM with significant pain from peripheral neuropathy and other chronic pain syndromes in his back and hips and shoulders.  He is here today because he has tried to find another PCP outside of [facility] but he has not been successful with PCP being willing to Rx his pain medications which he has been on her >10 years.  He was sent to pain management which he went to but they put him on extended release gabapentin which he felt like made him sick and they gave him a taper schedule for his Norco - he states he did tell her he might as well go off the meds because he did not know why he was there at pain management.  He states with the pain medications he still has pain esp the burning pain from his neuropathy but he is able to do some work around the house but without the medication he does not think that he would be able to do as much physical labor.  He is disabled.  He knows he needs to control his DM better and he has restarted his metformin - he has trouble affording all his medications. They are close to $1000 a month due to his insurance.  He currently takes about 5 Norco a day most days.  His dose has not been changed in years.  Review of Systems   Patient Active Problem List   Diagnosis Date Noted  . Pain in joint, shoulder region 06/29/2015  . Hyperlipidemia 10/17/2014  . Arteriosclerosis 06/17/2014  . Lung nodule-CT 3/15 06/17/2014  . Hepatomegaly-steatosis 08/08/2013  . DM neuropathy, painful ([place]) 10/19/2011  . Hip pain, chronic 10/19/2011  . Onychomycosis 10/19/2011  . Nephrolithiasis 10/19/2011  . Carpal tunnel syndrome 10/19/2011  . Obesity, Class III, BMI 40-49.9 (morbid obesity) ([place]) 04/30/2011  . DM type 2, uncontrolled, with neuropathy ([place]) 04/30/2011  . Gout 04/30/2011  .  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 allopurinol (ZYLOPRIM) 300 MG tablet Take 1 tablet (300 mg total) by mouth daily. 90 tablet 3  . Blood Gluc Meter Disp-Strips (BLOOD GLUCOSE METER DISPOSABLE) DEVI 1 strip by Percutaneous route 2 (two) times daily before a meal. Test blood sugar once a day as directed. Dx Code: 250.00 180 each 3  . Blood Glucose Monitoring Suppl (BLOOD GLUCOSE MONITOR KIT) KIT Use as directed 1 each 0  . Blood Glucose Monitoring Suppl (ONE TOUCH ULTRA SYSTEM KIT) w/Device KIT Use as directed.  DX Code: E11.40 1 each 0  . gabapentin (NEURONTIN) 400 MG capsule One am, one midday, and 3 hs (Patient taking differently: 600 mg. One am, one midday, and 3 hs) 450 capsule 3  . glucose blood test strip Use as directed twice a day. DX Code: E11.40 100 each 12  . clotrimazole-betamethasone (LOTRISONE) cream Apply 1 application topically 2 (two) times daily. (Patient not taking: Reported on 11/23/2015) 30 g 2  . insulin NPH-regular Human (NOVOLIN 70/30) (70-30) 100 UNIT/ML injection Inject 55 Units into the skin 2 (two) times daily with a meal. Dose will be escalating (Patient not taking: Reported on 11/23/2015) 30 mL 12  . Insulin Syringe-Needle U-100 (INSULIN SYRINGE 1CC/31GX5/16") 31G X 5/16" 1 ML MISC Use as directed  once a day. Dx Code: 250.00 (Patient not taking: Reported on 11/23/2015) 100 each 1  . Lancets MISC Use as directed once day. Dx Code: 250.00 (Patient not taking: Reported on 11/23/2015) 100 each 2  . ONETOUCH DELICA LANCETS 81O MISC Use as directed twice a day. DX Code: E11.40 (Patient not taking: Reported on 11/23/2015) 100 each 12   No current facility-administered medications on file prior to visit.    No Known Allergies  Objective:  BP 104/68 mmHg  Pulse 70  Temp(Src) 98 F (36.7 C) (Oral)  Resp 18  Ht '5\' 11"'$  (1.803 m)  Wt 272 lb 9.6 oz (123.651 kg)  BMI 38.04 kg/m2  SpO2  98%  Physical Exam  Constitutional: He is oriented to person, place, and time and well-developed, well-nourished, and in no distress.  HENT:  Head: Normocephalic and atraumatic.  Right Ear: External ear normal.  Left Ear: External ear normal.  Eyes: Conjunctivae are normal.  Neck: Normal range of motion.  Pulmonary/Chest: Effort normal.  Neurological: He is alert and oriented to person, place, and time. Gait normal.  Skin: Skin is warm and dry.  Psychiatric: Mood, memory, affect and judgment normal.    Assessment and Plan :  DM type 2, uncontrolled, with neuropathy ([place]) - Plan: lisinopril (PRINIVIL,ZESTRIL) 5 MG tablet, metFORMIN (GLUCOPHAGE-XR) 500 MG 24 hr tablet  Diabetic polyneuropathy associated with type 2 diabetes mellitus ([place]) - Plan: HYDROcodone-acetaminophen (NORCO) 10-325 MG tablet  Hyperlipidemia - Plan: rosuvastatin (CRESTOR) 20 MG tablet   Pt has multiple complex medical conditions today we dealt with his pain  Medication issue and increased his diabetes medication and added a statin to his medication regimen.  I have written him his pain medication and as long as his dose does not change I believe I will be able to continue you for him but we have to work on getting his other medical conditions controlled.  If we can better control his DM we will be able to hopefully prevent worsening of his neuropathy pain.  His BP is low today and this am he passed out because of his low blood pressure - we will decrease his dose to Lisinopril '5mg'$  and his wife will closely monitor that reading.  We will increase his gabapentin dose to the max of '1200mg'$  tid before his next visit in about 2 weeks.  We discussed at that visit will be take labs including a urine drug screen and cholesterol and DM labs - we may make adjustments to his medication at that time.    Upon review of his chart it appears that he night be a little early for his Norco as his last rx was written for 60d after 4/19.  We  want to be mindful that he is at the max of Tylenol dose - he states he takes no additional tylenol.  Windell Hummingbird PA-C  [facility] 11/24/2015 9:37 PM

## 2015-12-14 ENCOUNTER — Ambulatory Visit (INDEPENDENT_AMBULATORY_CARE_PROVIDER_SITE_OTHER): Payer: PPO

## 2015-12-14 ENCOUNTER — Ambulatory Visit (INDEPENDENT_AMBULATORY_CARE_PROVIDER_SITE_OTHER): Payer: PPO | Admitting: Physician Assistant

## 2015-12-14 VITALS — BP 122/72 | HR 72 | Temp 98.4°F | Resp 18 | Ht 71.0 in | Wt 273.8 lb

## 2015-12-14 DIAGNOSIS — Z23 Encounter for immunization: Secondary | ICD-10-CM | POA: Diagnosis not present

## 2015-12-14 DIAGNOSIS — IMO0002 Reserved for concepts with insufficient information to code with codable children: Secondary | ICD-10-CM

## 2015-12-14 DIAGNOSIS — E1165 Type 2 diabetes mellitus with hyperglycemia: Secondary | ICD-10-CM

## 2015-12-14 DIAGNOSIS — E11621 Type 2 diabetes mellitus with foot ulcer: Secondary | ICD-10-CM | POA: Diagnosis not present

## 2015-12-14 DIAGNOSIS — M25512 Pain in left shoulder: Secondary | ICD-10-CM | POA: Diagnosis not present

## 2015-12-14 DIAGNOSIS — E7800 Pure hypercholesterolemia, unspecified: Secondary | ICD-10-CM

## 2015-12-14 DIAGNOSIS — L97511 Non-pressure chronic ulcer of other part of right foot limited to breakdown of skin: Secondary | ICD-10-CM

## 2015-12-14 DIAGNOSIS — E1140 Type 2 diabetes mellitus with diabetic neuropathy, unspecified: Secondary | ICD-10-CM | POA: Diagnosis not present

## 2015-12-14 DIAGNOSIS — Z114 Encounter for screening for human immunodeficiency virus [HIV]: Secondary | ICD-10-CM | POA: Diagnosis not present

## 2015-12-14 DIAGNOSIS — L97419 Non-pressure chronic ulcer of right heel and midfoot with unspecified severity: Secondary | ICD-10-CM | POA: Diagnosis not present

## 2015-12-14 LAB — POCT GLYCOSYLATED HEMOGLOBIN (HGB A1C): Hemoglobin A1C: 10.2

## 2015-12-14 LAB — POCT CBC
GRANULOCYTE PERCENT: 68.8 % (ref 37–80)
HEMATOCRIT: 31.9 % — AB (ref 43.5–53.7)
Hemoglobin: 10.9 g/dL — AB (ref 14.1–18.1)
Lymph, poc: 3.7 — AB (ref 0.6–3.4)
MCH: 28.9 pg (ref 27–31.2)
MCHC: 34.1 g/dL (ref 31.8–35.4)
MCV: 84.8 fL (ref 80–97)
MID (cbc): 0.1 (ref 0–0.9)
MPV: 8.3 fL (ref 0–99.8)
PLATELET COUNT, POC: 235 10*3/uL (ref 142–424)
POC GRANULOCYTE: 8.4 — AB (ref 2–6.9)
POC LYMPH %: 30 % (ref 10–50)
POC MID %: 1.2 %M (ref 0–12)
RBC: 3.76 M/uL — AB (ref 4.69–6.13)
RDW, POC: 13.2 %
WBC: 12.2 10*3/uL — AB (ref 4.6–10.2)

## 2015-12-14 MED ORDER — CEFTRIAXONE SODIUM 1 G IJ SOLR
1.0000 g | Freq: Once | INTRAMUSCULAR | Status: AC
  Administered 2015-12-14: 1 g via INTRAMUSCULAR

## 2015-12-14 MED ORDER — DULAGLUTIDE 0.75 MG/0.5ML ~~LOC~~ SOAJ
0.7500 mg | SUBCUTANEOUS | 0 refills | Status: DC
Start: 2015-12-14 — End: 2016-01-03

## 2015-12-14 NOTE — Patient Instructions (Addendum)
  Lantus - start at 25U 2x/day - increase by 1 Units every 4 days until either - you get to an am glucose of 140 or you reach 50 units 2x/day  See you Tomorrow for wound care -  No more crocks in the goat lot   IF you received an x-ray today, you will receive an invoice from [facility]. Please contact [facility] at [phone] with questions or concerns regarding your invoice.   IF you received labwork today, you will receive an invoice from [employer]. Please contact [facility] at [phone] with questions or concerns regarding your invoice.   Our billing staff will not be able to assist you with questions regarding bills from these companies.  You will be contacted with the lab results as soon as they are available. The fastest way to get your results is to activate your My Chart account. Instructions are located on the last page of this paperwork. If you have not heard from [place] regarding the results in 2 weeks, please contact this office.
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
## 2015-12-15 ENCOUNTER — Ambulatory Visit (INDEPENDENT_AMBULATORY_CARE_PROVIDER_SITE_OTHER): Payer: PPO | Admitting: Physician Assistant

## 2015-12-15 VITALS — BP 132/82 | HR 67 | Temp 98.2°F | Resp 18 | Ht 71.0 in | Wt 272.0 lb

## 2015-12-15 DIAGNOSIS — L97511 Non-pressure chronic ulcer of other part of right foot limited to breakdown of skin: Secondary | ICD-10-CM

## 2015-12-15 DIAGNOSIS — E1140 Type 2 diabetes mellitus with diabetic neuropathy, unspecified: Secondary | ICD-10-CM | POA: Diagnosis not present

## 2015-12-15 DIAGNOSIS — IMO0002 Reserved for concepts with insufficient information to code with codable children: Secondary | ICD-10-CM

## 2015-12-15 DIAGNOSIS — E1165 Type 2 diabetes mellitus with hyperglycemia: Secondary | ICD-10-CM

## 2015-12-15 LAB — POCT CBC
GRANULOCYTE PERCENT: 64.4 % (ref 37–80)
HCT, POC: 31.8 % — AB (ref 43.5–53.7)
HEMOGLOBIN: 10.7 g/dL — AB (ref 14.1–18.1)
Lymph, poc: 3.8 — AB (ref 0.6–3.4)
MCH, POC: 28.6 pg (ref 27–31.2)
MCHC: 33.7 g/dL (ref 31.8–35.4)
MCV: 84.7 fL (ref 80–97)
MID (cbc): 0.4 (ref 0–0.9)
MPV: 7.6 fL (ref 0–99.8)
PLATELET COUNT, POC: 262 10*3/uL (ref 142–424)
POC GRANULOCYTE: 7.7 — AB (ref 2–6.9)
POC LYMPH %: 32 % (ref 10–50)
POC MID %: 3.6 %M (ref 0–12)
RBC: 3.75 M/uL — AB (ref 4.69–6.13)
RDW, POC: 13.6 %
WBC: 11.9 10*3/uL — AB (ref 4.6–10.2)

## 2015-12-15 LAB — COMPLETE METABOLIC PANEL WITH GFR
ALBUMIN: 3.7 g/dL (ref 3.6–5.1)
ALT: 9 U/L (ref 9–46)
AST: 12 U/L (ref 10–35)
Alkaline Phosphatase: 74 U/L (ref 40–115)
BUN: 41 mg/dL — AB (ref 7–25)
CALCIUM: 8.7 mg/dL (ref 8.6–10.3)
CHLORIDE: 103 mmol/L (ref 98–110)
CO2: 22 mmol/L (ref 20–31)
CREATININE: 1.48 mg/dL — AB (ref 0.70–1.33)
GFR, Est African American: 62 mL/min (ref >=60)
GFR, Est Non African American: 54 mL/min — ABNORMAL LOW (ref >=60)
GLUCOSE: 311 mg/dL — AB (ref 65–99)
Potassium: 5.1 mmol/L (ref 3.5–5.3)
SODIUM: 136 mmol/L (ref 135–146)
Total Bilirubin: 0.3 mg/dL (ref 0.2–1.2)
Total Protein: 7.4 g/dL (ref 6.1–8.1)

## 2015-12-15 LAB — LIPID PANEL
CHOL/HDL RATIO: 4.3 ratio (ref <=5.0)
Cholesterol: 136 mg/dL (ref 125–200)
HDL: 32 mg/dL — AB (ref >=40)
LDL CALC: 69 mg/dL (ref <130)
Triglycerides: 177 mg/dL — ABNORMAL HIGH (ref <150)
VLDL: 35 mg/dL — AB (ref <30)

## 2015-12-15 LAB — HIV ANTIBODY (ROUTINE TESTING W REFLEX): HIV: NONREACTIVE

## 2015-12-15 MED ORDER — EXENATIDE ER 2 MG ~~LOC~~ PEN: 2.0000 mg | PEN_INJECTOR | SUBCUTANEOUS | 2 refills | Status: DC

## 2015-12-15 NOTE — Progress Notes (Signed)
[name] Snow  MRN: [id number] DOB: [birth year]  Subjective:  Pt presents to clinic for ulceration check.  He has not changed the dressing from yesterday.  He feels fine and is tolerating the abx ok.    Review of Systems  Patient Active Problem List   Diagnosis Date Noted  . Pain in joint, shoulder region 06/29/2015  . Hyperlipidemia 10/17/2014  . Arteriosclerosis 06/17/2014  . Lung nodule-CT 3/15 06/17/2014  . Hepatomegaly-steatosis 08/08/2013  . DM neuropathy, painful ([place]) 10/19/2011  . Hip pain, chronic 10/19/2011  . Onychomycosis 10/19/2011  . Nephrolithiasis 10/19/2011  . Carpal tunnel syndrome 10/19/2011  . Obesity, Class III, BMI 40-49.9 (morbid obesity) ([place]) 04/30/2011  . DM type 2, uncontrolled, with neuropathy ([place]) 04/30/2011  . Gout 04/30/2011  . 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 allopurinol (ZYLOPRIM) 300 MG tablet Take 1 tablet (300 mg total) by mouth daily. 90 tablet 3  . Blood Gluc Meter Disp-Strips (BLOOD GLUCOSE METER DISPOSABLE) DEVI 1 strip by Percutaneous route 2 (two) times daily before a meal. Test blood sugar once a day as directed. Dx Code: 250.00 180 each 3  . Blood Glucose Monitoring Suppl (BLOOD GLUCOSE MONITOR KIT) KIT Use as directed 1 each 0  . Blood Glucose Monitoring Suppl (ONE TOUCH ULTRA SYSTEM KIT) w/Device KIT Use as directed.  DX Code: E11.40 1 each 0  . clotrimazole-betamethasone (LOTRISONE) cream Apply 1 application topically 2 (two) times daily. 30 g 2  . Dulaglutide (TRULICITY) 7.90 WI/0.9BD SOPN Inject 0.75 mg into the skin once a week. 4 pen 0  . gabapentin (NEURONTIN) 400 MG capsule One am, one midday, and 3 hs (Patient taking differently: 600 mg. One am, one midday, and 3 hs) 450 capsule 3  . glucose blood test strip Use as directed twice a day. DX Code: E11.40 100 each 12  . HYDROcodone-acetaminophen (NORCO) 10-325 MG tablet Take 1 tablet  by mouth every 4 (four) hours as needed. 180 tablet 0  . Insulin Syringe-Needle U-100 (INSULIN SYRINGE 1CC/31GX5/16") 31G X 5/16" 1 ML MISC Use as directed once a day. Dx Code: 250.00 100 each 1  . Lancets MISC Use as directed once day. Dx Code: 250.00 100 each 2  . lisinopril (PRINIVIL,ZESTRIL) 5 MG tablet Take 1 tablet (5 mg total) by mouth daily. 90 tablet 0  . metFORMIN (GLUCOPHAGE-XR) 500 MG 24 hr tablet Take 2 tablets (1,000 mg total) by mouth 2 (two) times daily. 120 tablet 0  . ONETOUCH DELICA LANCETS 53G MISC Use as directed twice a day. DX Code: E11.40 100 each 12  . rosuvastatin (CRESTOR) 20 MG tablet Take 1 tablet (20 mg total) by mouth daily. 30 tablet 0   No current facility-administered medications on file prior to visit.     No Known Allergies  Objective:  BP 132/82 (BP Location: Right Arm, Patient Position: Sitting, Cuff Size: Normal)   Pulse 67   Temp 98.2 F (36.8 C) (Oral)   Resp 18   Ht 5' 11" (1.803 m)   Wt 272 lb (123.4 kg)   SpO2 97%   BMI 37.94 kg/m   Physical Exam  Constitutional: He is oriented to person, place, and time and well-developed, well-nourished, and in no distress.  HENT:  Head: Normocephalic and atraumatic.  Right Ear: External ear normal.  Left Ear: External ear normal.  Eyes: Conjunctivae are normal.  Neck: Normal range of motion.  Pulmonary/Chest:  Effort normal.  Neurological: He is alert and oriented to person, place, and time. Gait normal.  Skin: Skin is warm and dry.  Psychiatric: Mood, memory, affect and judgment normal.    Toe ulcer further debrided - during debridement a piece of skin was removed which caused another wound that is seen above - necrotic tissue was removed without anesthesia - pt has no sensation in his foot - and 1/2 in packing was placed in the wound as well as a dressing over the wounds.  Results for orders placed or performed in visit on 12/15/15  POCT CBC  Result Value Ref Range   WBC 11.9 (A) 4.6 - 10.2  K/uL   Lymph, poc 3.8 (A) 0.6 - 3.4   POC LYMPH PERCENT 32.0 10 - 50 %L   MID (cbc) 0.4 0 - 0.9   POC MID % 3.6 0 - 12 %M   POC Granulocyte 7.7 (A) 2 - 6.9   Granulocyte percent 64.4 37 - 80 %G   RBC 3.75 (A) 4.69 - 6.13 M/uL   Hemoglobin 10.7 (A) 14.1 - 18.1 g/dL   HCT, POC 31.8 (A) 43.5 - 53.7 %   MCV 84.7 80 - 97 fL   MCH, POC 28.6 27 - 31.2 pg   MCHC 33.7 31.8 - 35.4 g/dL   RDW, POC 13.6 %   Platelet Count, POC 262 142 - 424 K/uL   MPV 7.6 0 - 99.8 fL    Assessment and Plan :  Ulcer of great toe, right, limited to breakdown of skin ([facility]) - Plan: POCT CBC  DM type 2, uncontrolled, with neuropathy ([facility]) - Plan: Exenatide ER (BYDUREON) 2 MG PEN   Pts insurance will likely not cover Trulicity so a Rx for bydureon was given today - he will keep his toe clean and dry and continue his abx -- he will recheck in 48h for wound recheck - he will continue to improve his eating and continue his DM medications as we discussed that he as greater chance of healing with better DM control.  Windell Hummingbird PA-C  [facility] 12/15/2015 1:09 PM

## 2015-12-15 NOTE — Patient Instructions (Signed)
     IF you received an x-ray today, you will receive an invoice from [facility]. Please contact [facility] at [phone] with questions or concerns regarding your invoice.   IF you received labwork today, you will receive an invoice from [facility]. Please contact [facility] at [phone] with questions or concerns regarding your invoice.   Our billing staff will not be able to assist you with questions regarding bills from these companies.  You will be contacted with the lab results as soon as they are available. The fastest way to get your results is to activate your My Chart account. Instructions are located on the last page of this paperwork. If you have not heard from us regarding the results in 2 weeks, please contact this office.      

## 2015-12-17 ENCOUNTER — Encounter: Payer: Self-pay | Admitting: Physician Assistant

## 2015-12-17 ENCOUNTER — Ambulatory Visit (INDEPENDENT_AMBULATORY_CARE_PROVIDER_SITE_OTHER): Payer: PPO | Admitting: Physician Assistant

## 2015-12-17 VITALS — BP 136/76 | HR 53 | Temp 98.1°F | Resp 18 | Ht 70.5 in | Wt 273.5 lb

## 2015-12-17 DIAGNOSIS — L97511 Non-pressure chronic ulcer of other part of right foot limited to breakdown of skin: Secondary | ICD-10-CM

## 2015-12-17 DIAGNOSIS — B954 Other streptococcus as the cause of diseases classified elsewhere: Secondary | ICD-10-CM

## 2015-12-17 LAB — POCT CBC
GRANULOCYTE PERCENT: 64.9 % (ref 37–80)
HEMATOCRIT: 32.3 % — AB (ref 43.5–53.7)
Hemoglobin: 11.2 g/dL — AB (ref 14.1–18.1)
Lymph, poc: 3.1 (ref 0.6–3.4)
MCH: 29.2 pg (ref 27–31.2)
MCHC: 34.6 g/dL (ref 31.8–35.4)
MCV: 94.4 fL (ref 80–97)
MID (CBC): 0.4 (ref 0–0.9)
MPV: 7.8 fL (ref 0–99.8)
POC Granulocyte: 6.4 (ref 2–6.9)
POC LYMPH PERCENT: 31.4 %L (ref 10–50)
POC MID %: 3.7 % (ref 0–12)
Platelet Count, POC: 259 10*3/uL (ref 142–424)
RBC: 3.83 M/uL — AB (ref 4.69–6.13)
RDW, POC: 13.2 %
WBC: 9.9 10*3/uL (ref 4.6–10.2)

## 2015-12-17 MED ORDER — DOXYCYCLINE HYCLATE 100 MG PO TABS: 100.0000 mg | ORAL_TABLET | Freq: Two times a day (BID) | ORAL | 0 refills | Status: DC

## 2015-12-17 MED ORDER — AMOXICILLIN-POT CLAVULANATE 875-125 MG PO TABS: 1.0000 | ORAL_TABLET | Freq: Two times a day (BID) | ORAL | 0 refills | Status: DC

## 2015-12-17 NOTE — Progress Notes (Signed)
[name] Snow  MRN: [id number] DOB: 01/14/1966  Subjective:  Pt presents to clinic for wound recheck of his DM right great toe ulceration - he had 1 episode of chills last night - he has changed the dressing at least once daily since his visit 2 days ago - He has had no documented fevers.  He noticed that an oral abx was not at the pharmacy.  He has been careful to keep the wound clean.  Review of Systems  Constitutional: Negative for chills and fever.    Patient Active Problem List   Diagnosis Date Noted  . Pain in joint, shoulder region 06/29/2015  . Hyperlipidemia 10/17/2014  . Arteriosclerosis 06/17/2014  . Lung nodule-CT 3/15 06/17/2014  . Hepatomegaly-steatosis 08/08/2013  . DM neuropathy, painful ([place]) 10/19/2011  . Hip pain, chronic 10/19/2011  . Onychomycosis 10/19/2011  . Nephrolithiasis 10/19/2011  . Carpal tunnel syndrome 10/19/2011  . Obesity, Class III, BMI 40-49.9 (morbid obesity) ([place]) 04/30/2011  . DM type 2, uncontrolled, with neuropathy ([place]) 04/30/2011  . Gout 04/30/2011  . 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 allopurinol (ZYLOPRIM) 300 MG tablet Take 1 tablet (300 mg total) by mouth daily. 90 tablet 3  . clotrimazole-betamethasone (LOTRISONE) cream Apply 1 application topically 2 (two) times daily. 30 g 2  . Dulaglutide (TRULICITY) 3.15 QM/0.8QP SOPN Inject 0.75 mg into the skin once a week. 4 pen 0  . Exenatide ER (BYDUREON) 2 MG PEN Inject 2 mg into the skin once a week. 4 each 2  . gabapentin (NEURONTIN) 400 MG capsule One am, one midday, and 3 hs (Patient taking differently: 600 mg. One am, one midday, and 3 hs) 450 capsule 3  . HYDROcodone-acetaminophen (NORCO) 10-325 MG tablet Take 1 tablet by mouth every 4 (four) hours as needed. 180 tablet 0  . lisinopril (PRINIVIL,ZESTRIL) 5 MG tablet Take 1 tablet (5 mg total) by mouth daily. 90 tablet 0  . metFORMIN  (GLUCOPHAGE-XR) 500 MG 24 hr tablet Take 2 tablets (1,000 mg total) by mouth 2 (two) times daily. 120 tablet 0  . rosuvastatin (CRESTOR) 20 MG tablet Take 1 tablet (20 mg total) by mouth daily. 30 tablet 0  . Blood Gluc Meter Disp-Strips (BLOOD GLUCOSE METER DISPOSABLE) DEVI 1 strip by Percutaneous route 2 (two) times daily before a meal. Test blood sugar once a day as directed. Dx Code: 250.00 180 each 3  . Blood Glucose Monitoring Suppl (BLOOD GLUCOSE MONITOR KIT) KIT Use as directed 1 each 0  . Blood Glucose Monitoring Suppl (ONE TOUCH ULTRA SYSTEM KIT) w/Device KIT Use as directed.  DX Code: E11.40 1 each 0  . glucose blood test strip Use as directed twice a day. DX Code: E11.40 100 each 12  . Insulin Syringe-Needle U-100 (INSULIN SYRINGE 1CC/31GX5/16") 31G X 5/16" 1 ML MISC Use as directed once a day. Dx Code: 250.00 100 each 1  . Lancets MISC Use as directed once day. Dx Code: 250.00 100 each 2  . ONETOUCH DELICA LANCETS 61P MISC Use as directed twice a day. DX Code: E11.40 100 each 12   No current facility-administered medications on file prior to visit.     No Known Allergies  Objective:  BP 136/76 (BP Location: Left Arm, Patient Position: Sitting, Cuff Size: Large)   Pulse (!) 53   Temp 98.1 F (36.7 C) (Oral)   Resp 18   Ht 5' 10.5" (1.791  m)   Wt 273 lb 8 oz (124.1 kg)   SpO2 97%   BMI 38.69 kg/m   Physical Exam  Constitutional: He is oriented to person, place, and time and well-developed, well-nourished, and in no distress.  HENT:  Head: Normocephalic and atraumatic.  Right Ear: External ear normal.  Left Ear: External ear normal.  Eyes: Conjunctivae are normal.  Neck: Normal range of motion.  Pulmonary/Chest: Effort normal.  Neurological: He is alert and oriented to person, place, and time. Gait normal.  Skin: Skin is warm and dry.  Right great toe - purulence expressed - more necrotic tissue removed - probed down to the bone - the blanched skin surrounding the  ulcer seems larger today - pt has no sensation in the toe - the toe feels warm  Psychiatric: Mood, memory, affect and judgment normal.   Results for orders placed or performed in visit on 12/17/15  POCT CBC  Result Value Ref Range   WBC 9.9 4.6 - 10.2 K/uL   Lymph, poc 3.1 0.6 - 3.4   POC LYMPH PERCENT 31.4 10 - 50 %L   MID (cbc) 0.4 0 - 0.9   POC MID % 3.7 0 - 12 %M   POC Granulocyte 6.4 2 - 6.9   Granulocyte percent 64.9 37 - 80 %G   RBC 3.83 (A) 4.69 - 6.13 M/uL   Hemoglobin 11.2 (A) 14.1 - 18.1 g/dL   HCT, POC 32.3 (A) 43.5 - 53.7 %   MCV 94.4 80 - 97 fL   MCH, POC 29.2 27 - 31.2 pg   MCHC 34.6 31.8 - 35.4 g/dL   RDW, POC 13.2 %   Platelet Count, POC 259 142 - 424 K/uL   MPV 7.8 0 - 99.8 fL     Assessment and Plan :  Ulcer of great toe, right, limited to breakdown of skin (HCC) - Plan: doxycycline (VIBRA-TABS) 100 MG tablet, POCT CBC, amoxicillin-clavulanate (AUGMENTIN) 875-125 MG tablet, Ambulatory referral to Orthopedic Surgery  Group G streptococcal infection - Plan: amoxicillin-clavulanate (AUGMENTIN) 875-125 MG tablet, Ambulatory referral to Orthopedic Surgery   Wound culture grew out group G strep - we will place him on Augmentin due to deep wound near bone - change dressing at least once daily - getting an appt with ortho this week for evaluation - d/w pt the concern for possible amputation in the future due to depth and location.   D/w Dr Gillis Ends PA-C  [facility] 12/17/2015 11:01 AM

## 2015-12-19 LAB — WOUND CULTURE

## 2015-12-20 ENCOUNTER — Ambulatory Visit: Payer: PPO

## 2015-12-21 ENCOUNTER — Ambulatory Visit (INDEPENDENT_AMBULATORY_CARE_PROVIDER_SITE_OTHER): Payer: PPO | Admitting: Physician Assistant

## 2015-12-21 ENCOUNTER — Telehealth: Payer: Self-pay

## 2015-12-21 VITALS — BP 120/72 | HR 72 | Temp 98.2°F | Resp 18 | Ht 70.5 in | Wt 265.8 lb

## 2015-12-21 DIAGNOSIS — L97511 Non-pressure chronic ulcer of other part of right foot limited to breakdown of skin: Secondary | ICD-10-CM

## 2015-12-21 DIAGNOSIS — B954 Other streptococcus as the cause of diseases classified elsewhere: Secondary | ICD-10-CM | POA: Diagnosis not present

## 2015-12-21 NOTE — Patient Instructions (Addendum)
  Change dressing daily Continue antibiotics Recheck in 5 days (sooner if worse)   IF you received an x-ray today, you will receive an invoice from [facility]. Please contact [facility] at [phone] with questions or concerns regarding your invoice.   IF you received labwork today, you will receive an invoice from [employer]. Please contact [facility] at [phone] with questions or concerns regarding your invoice.   Our billing staff will not be able to assist you with questions regarding bills from these companies.  You will be contacted with the lab results as soon as they are available. The fastest way to get your results is to activate your My Chart account. Instructions are located on the last page of this paperwork. If you have not heard from [place] regarding the results in 2 weeks, please contact this office.

## 2015-12-21 NOTE — Telephone Encounter (Signed)
I contacted the patient to let him know that his appointment was changed in order to accomodate both his ulcer and his shoulder issues.  His new appointment is on 12/27/15 at 11:00am with Dr [name], and I verified that he will be able to be seen for both of his concerns.  The patient understood and was agreeable to this.

## 2015-12-21 NOTE — Progress Notes (Signed)
[name] Snow  MRN: [id number] DOB: Sep 21, 1965  Subjective:  Pt presents to clinic for recheck of his toe ulceration - he thinks it is doing better.  He has been changing the dressing regularly and keeping it clean. He has been taking his abx as Rx.  Pt started trulicity this week and he is at 26 U Lantus bid - he is tolerating well - his glucose at in the low 200s  Ortho was cancel and rescheduled for 2 weeks - he is having a lot of problems with his shoulder Wound care appt in 2 weeks  Review of Systems  Constitutional: Negative for chills and fever.  Skin: Positive for wound.    Patient Active Problem List   Diagnosis Date Noted  . Pain in joint, shoulder region 06/29/2015  . Hyperlipidemia 10/17/2014  . Arteriosclerosis 06/17/2014  . Lung nodule-CT 3/15 06/17/2014  . Hepatomegaly-steatosis 08/08/2013  . DM neuropathy, painful ([place]) 10/19/2011  . Hip pain, chronic 10/19/2011  . Onychomycosis 10/19/2011  . Nephrolithiasis 10/19/2011  . Carpal tunnel syndrome 10/19/2011  . Obesity, Class III, BMI 40-49.9 (morbid obesity) ([place]) 04/30/2011  . DM type 2, uncontrolled, with neuropathy ([place]) 04/30/2011  . Gout 04/30/2011  . 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 allopurinol (ZYLOPRIM) 300 MG tablet Take 1 tablet (300 mg total) by mouth daily. 90 tablet 3  . amoxicillin-clavulanate (AUGMENTIN) 875-125 MG tablet Take 1 tablet by mouth 2 (two) times daily. 20 tablet 0  . Blood Gluc Meter Disp-Strips (BLOOD GLUCOSE METER DISPOSABLE) DEVI 1 strip by Percutaneous route 2 (two) times daily before a meal. Test blood sugar once a day as directed. Dx Code: 250.00 180 each 3  . Blood Glucose Monitoring Suppl (BLOOD GLUCOSE MONITOR KIT) KIT Use as directed 1 each 0  . Blood Glucose Monitoring Suppl (ONE TOUCH ULTRA SYSTEM KIT) w/Device KIT Use as directed.  DX Code: E11.40 1 each 0  .  clotrimazole-betamethasone (LOTRISONE) cream Apply 1 application topically 2 (two) times daily. 30 g 2  . doxycycline (VIBRA-TABS) 100 MG tablet Take 1 tablet (100 mg total) by mouth 2 (two) times daily. 20 tablet 0  . Dulaglutide (TRULICITY) 9.47 ML/4.6TK SOPN Inject 0.75 mg into the skin once a week. 4 pen 0  . Exenatide ER (BYDUREON) 2 MG PEN Inject 2 mg into the skin once a week. 4 each 2  . gabapentin (NEURONTIN) 400 MG capsule One am, one midday, and 3 hs (Patient taking differently: 600 mg. One am, one midday, and 3 hs) 450 capsule 3  . glucose blood test strip Use as directed twice a day. DX Code: E11.40 100 each 12  . HYDROcodone-acetaminophen (NORCO) 10-325 MG tablet Take 1 tablet by mouth every 4 (four) hours as needed. 180 tablet 0  . Insulin Syringe-Needle U-100 (INSULIN SYRINGE 1CC/31GX5/16") 31G X 5/16" 1 ML MISC Use as directed once a day. Dx Code: 250.00 100 each 1  . Lancets MISC Use as directed once day. Dx Code: 250.00 100 each 2  . lisinopril (PRINIVIL,ZESTRIL) 5 MG tablet Take 1 tablet (5 mg total) by mouth daily. 90 tablet 0  . metFORMIN (GLUCOPHAGE-XR) 500 MG 24 hr tablet Take 2 tablets (1,000 mg total) by mouth 2 (two) times daily. 120 tablet 0  . ONETOUCH DELICA LANCETS 35W MISC Use as directed twice a day. DX Code: E11.40 100 each 12  . rosuvastatin (CRESTOR) 20 MG tablet  Take 1 tablet (20 mg total) by mouth daily. 30 tablet 0   No current facility-administered medications on file prior to visit.     No Known Allergies  Pt patients past, family and social history were reviewed and updated.  Objective:  BP 120/72   Pulse 72   Temp 98.2 F (36.8 C) (Oral)   Resp 18   Ht 5' 10.5" (1.791 m)   Wt 265 lb 12.8 oz (120.6 kg)   SpO2 96%   BMI 37.60 kg/m   Physical Exam  Constitutional: He is oriented to person, place, and time and well-developed, well-nourished, and in no distress.  HENT:  Head: Normocephalic and atraumatic.  Right Ear: External ear normal.    Left Ear: External ear normal.  Eyes: Conjunctivae are normal.  Neck: Normal range of motion.  Pulmonary/Chest: Effort normal.  Neurological: He is alert and oriented to person, place, and time. Gait normal.  Skin: Skin is warm and dry.  Right great toe - less erythema with some healing tissue at the wound opening - the wound itself seems not as deep and small amount of thick eschar was removed - no granulation tissue was seen  Psychiatric: Mood, memory, affect and judgment normal.    Assessment and Plan :  Ulcer of great toe, right, limited to breakdown of skin (HCC)  Group G streptococcal infection - right great toe - improving - continue and finish abx - keep wound clean and dry - he will keep the appt with wound care and we may be able to cancel if he continues to improve - he will keep the appt with ortho because of his shoulder (we will check to make sure who he is scheduled with will be willing to look at both) --   Windell Hummingbird PA-C  [facility] 12/21/2015 12:49 PM

## 2016-01-02 ENCOUNTER — Ambulatory Visit (HOSPITAL_BASED_OUTPATIENT_CLINIC_OR_DEPARTMENT_OTHER): Payer: PPO

## 2016-01-03 ENCOUNTER — Ambulatory Visit (INDEPENDENT_AMBULATORY_CARE_PROVIDER_SITE_OTHER): Payer: PPO | Admitting: Physician Assistant

## 2016-01-03 VITALS — BP 130/70 | HR 60 | Temp 98.0°F | Resp 18 | Ht 70.5 in | Wt 273.0 lb

## 2016-01-03 DIAGNOSIS — IMO0002 Reserved for concepts with insufficient information to code with codable children: Secondary | ICD-10-CM

## 2016-01-03 DIAGNOSIS — G894 Chronic pain syndrome: Secondary | ICD-10-CM

## 2016-01-03 DIAGNOSIS — E1140 Type 2 diabetes mellitus with diabetic neuropathy, unspecified: Secondary | ICD-10-CM | POA: Diagnosis not present

## 2016-01-03 DIAGNOSIS — E1165 Type 2 diabetes mellitus with hyperglycemia: Secondary | ICD-10-CM

## 2016-01-03 DIAGNOSIS — E1142 Type 2 diabetes mellitus with diabetic polyneuropathy: Secondary | ICD-10-CM | POA: Diagnosis not present

## 2016-01-03 MED ORDER — HYDROCODONE-ACETAMINOPHEN 10-325 MG PO TABS: 1.0000 | ORAL_TABLET | ORAL | 0 refills | Status: DC | PRN

## 2016-01-03 MED ORDER — DULAGLUTIDE 0.75 MG/0.5ML ~~LOC~~ SOAJ: 0.7500 mg | SUBCUTANEOUS | 0 refills | Status: DC

## 2016-01-03 NOTE — Progress Notes (Signed)
[name] Snow  MRN: [id number] DOB: 08/10/1965  Subjective:  Pt presents to clinic for recheck and medication refill.    Podiatrist - [facility] - 9/5 - they are taking care of his ulceration on his foot Ortho was rescheduled for his shoulder  Opioid risk tool -  Father - Rx drugs, ETOH Brother - cocaine and Rx drugs Physically abuse but not sexually No depression or other mental health issues  Drugs database - appropriate/consistent with Rx  Review of Systems  Patient Active Problem List   Diagnosis Date Noted  . Pain in joint, shoulder region 06/29/2015  . Hyperlipidemia 10/17/2014  . Arteriosclerosis 06/17/2014  . Lung nodule-CT 3/15 06/17/2014  . Hepatomegaly-steatosis 08/08/2013  . DM neuropathy, painful ([place]) 10/19/2011  . Hip pain, chronic 10/19/2011  . Onychomycosis 10/19/2011  . Nephrolithiasis 10/19/2011  . Carpal tunnel syndrome 10/19/2011  . Obesity, Class III, BMI 40-49.9 (morbid obesity) ([place]) 04/30/2011  . DM type 2, uncontrolled, with neuropathy ([place]) 04/30/2011  . Gout 04/30/2011  . 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 allopurinol (ZYLOPRIM) 300 MG tablet Take 1 tablet (300 mg total) by mouth daily. 90 tablet 3  . Blood Gluc Meter Disp-Strips (BLOOD GLUCOSE METER DISPOSABLE) DEVI 1 strip by Percutaneous route 2 (two) times daily before a meal. Test blood sugar once a day as directed. Dx Code: 250.00 180 each 3  . Blood Glucose Monitoring Suppl (BLOOD GLUCOSE MONITOR KIT) KIT Use as directed 1 each 0  . Blood Glucose Monitoring Suppl (ONE TOUCH ULTRA SYSTEM KIT) w/Device KIT Use as directed.  DX Code: E11.40 1 each 0  . clotrimazole-betamethasone (LOTRISONE) cream Apply 1 application topically 2 (two) times daily. 30 g 2  . Dulaglutide (TRULICITY) 3.56 YS/1.6OH SOPN Inject 0.75 mg into the skin once a week. 4 pen 0  . Exenatide ER (BYDUREON) 2 MG PEN Inject 2 mg  into the skin once a week. 4 each 2  . gabapentin (NEURONTIN) 400 MG capsule One am, one midday, and 3 hs (Patient taking differently: 600 mg. One am, one midday, and 3 hs) 450 capsule 3  . glucose blood test strip Use as directed twice a day. DX Code: E11.40 100 each 12  . Insulin Syringe-Needle U-100 (INSULIN SYRINGE 1CC/31GX5/16") 31G X 5/16" 1 ML MISC Use as directed once a day. Dx Code: 250.00 100 each 1  . Lancets MISC Use as directed once day. Dx Code: 250.00 100 each 2  . lisinopril (PRINIVIL,ZESTRIL) 5 MG tablet Take 1 tablet (5 mg total) by mouth daily. 90 tablet 0  . metFORMIN (GLUCOPHAGE-XR) 500 MG 24 hr tablet Take 2 tablets (1,000 mg total) by mouth 2 (two) times daily. 120 tablet 0  . ONETOUCH DELICA LANCETS 72B MISC Use as directed twice a day. DX Code: E11.40 100 each 12  . rosuvastatin (CRESTOR) 20 MG tablet Take 1 tablet (20 mg total) by mouth daily. 30 tablet 0   No current facility-administered medications on file prior to visit.     No Known Allergies  Pt patients past, family and social history were reviewed and updated.  Objective:  BP 130/70   Pulse 60   Temp 98 F (36.7 C) (Oral)   Resp 18   Ht 5' 10.5" (1.791 m)   Wt 273 lb (123.8 kg)   SpO2 97%   BMI 38.62 kg/m   Physical Exam  Constitutional: He is oriented to  person, place, and time and well-developed, well-nourished, and in no distress.  HENT:  Head: Normocephalic and atraumatic.  Right Ear: External ear normal.  Left Ear: External ear normal.  Eyes: Conjunctivae are normal.  Neck: Normal range of motion.  Pulmonary/Chest: Effort normal.  Neurological: He is alert and oriented to person, place, and time. Gait normal.  Skin: Skin is warm and dry.  Healing ulcerations on foot without erythema or signs of infection  Psychiatric: Mood, memory, affect and judgment normal.    Assessment and Plan :  Chronic pain syndrome - Plan: Pain Mgmt, Prof 8 Conf w/o mM, U  Diabetic polyneuropathy associated  with type 2 diabetes mellitus ([place]) - Plan: HYDROcodone-acetaminophen (NORCO) 10-325 MG tablet  Windell Hummingbird PA-C  [facility] 01/03/2016 2:14 PM

## 2016-01-03 NOTE — Patient Instructions (Signed)
Call next month for your Hydrocodone

## 2016-01-08 LAB — PAIN MGMT, PROF 8 CONF W/O MM, U
6 ACETYLMORPHINE: NEGATIVE ng/mL (ref <10)
ALCOHOL METABOLITES: NEGATIVE ng/mL (ref <500)
Amphetamines: NEGATIVE ng/mL (ref <500)
BUPRENORPHINE: NEGATIVE ng/mL (ref <5)
Benzodiazepines: NEGATIVE ng/mL (ref <100)
CODEINE: NEGATIVE ng/mL (ref <50)
Cocaine Metabolite: NEGATIVE ng/mL (ref <150)
Creatinine: 89.5 mg/dL (ref >=20.0)
Hydrocodone: 2820 ng/mL — ABNORMAL HIGH (ref <50)
Hydromorphone: 1243 ng/mL — ABNORMAL HIGH (ref <50)
MDMA: NEGATIVE ng/mL (ref <500)
Marijuana Metabolite: NEGATIVE ng/mL (ref <20)
Morphine: NEGATIVE ng/mL (ref <50)
Norhydrocodone: 1331 ng/mL — ABNORMAL HIGH (ref <50)
OPIATES: POSITIVE ng/mL — AB (ref <100)
Oxidant: NEGATIVE ug/mL (ref <200)
Oxycodone: NEGATIVE ng/mL (ref <100)
PLEASE NOTE: 0
pH: 5.39 (ref 4.5–9.0)

## 2016-01-09 ENCOUNTER — Telehealth: Payer: Self-pay

## 2016-01-09 DIAGNOSIS — N5089 Other specified disorders of the male genital organs: Secondary | ICD-10-CM | POA: Diagnosis not present

## 2016-01-09 DIAGNOSIS — N451 Epididymitis: Secondary | ICD-10-CM | POA: Diagnosis not present

## 2016-01-09 NOTE — Telephone Encounter (Signed)
PA completed for Trulicity on covermymeds. Pending. Pt was started on this in June, so not sure why it is just now needing a PA? I do not know whether another med in the class is preferred, but if so it will be on denial.

## 2016-01-10 ENCOUNTER — Telehealth: Payer: Self-pay

## 2016-01-10 NOTE — Telephone Encounter (Signed)
Pt received [facility]09/[facility] The physician was Dr. Galen DaftSamuel Keith. Dx-epididymitis. Pt's wife letting me know this for controlled substance agreement with [name]. She was told to notify us. Please advise at [phone]

## 2016-01-11 NOTE — Telephone Encounter (Signed)
PA approved through 05/18/16. Notified pharm.  

## 2016-01-12 NOTE — Telephone Encounter (Signed)
Noted  

## 2016-01-17 DIAGNOSIS — G894 Chronic pain syndrome: Secondary | ICD-10-CM | POA: Insufficient documentation

## 2016-01-31 ENCOUNTER — Other Ambulatory Visit: Payer: Self-pay | Admitting: Orthopedic Surgery

## 2016-01-31 ENCOUNTER — Ambulatory Visit (INDEPENDENT_AMBULATORY_CARE_PROVIDER_SITE_OTHER): Payer: PPO | Admitting: Physician Assistant

## 2016-01-31 VITALS — BP 140/82 | HR 72 | Temp 98.2°F | Resp 18 | Ht 70.5 in | Wt 263.0 lb

## 2016-01-31 DIAGNOSIS — E1142 Type 2 diabetes mellitus with diabetic polyneuropathy: Secondary | ICD-10-CM

## 2016-01-31 DIAGNOSIS — M25512 Pain in left shoulder: Secondary | ICD-10-CM

## 2016-01-31 DIAGNOSIS — E1165 Type 2 diabetes mellitus with hyperglycemia: Secondary | ICD-10-CM

## 2016-01-31 DIAGNOSIS — IMO0002 Reserved for concepts with insufficient information to code with codable children: Secondary | ICD-10-CM

## 2016-01-31 DIAGNOSIS — G894 Chronic pain syndrome: Secondary | ICD-10-CM | POA: Diagnosis not present

## 2016-01-31 DIAGNOSIS — E1140 Type 2 diabetes mellitus with diabetic neuropathy, unspecified: Secondary | ICD-10-CM

## 2016-01-31 DIAGNOSIS — M25612 Stiffness of left shoulder, not elsewhere classified: Secondary | ICD-10-CM

## 2016-01-31 DIAGNOSIS — R531 Weakness: Secondary | ICD-10-CM

## 2016-01-31 MED ORDER — HYDROCODONE-ACETAMINOPHEN 10-325 MG PO TABS: 1.0000 | ORAL_TABLET | ORAL | 0 refills | Status: DC | PRN

## 2016-01-31 MED ORDER — DULAGLUTIDE 0.75 MG/0.5ML ~~LOC~~ SOAJ: 0.7500 mg | SUBCUTANEOUS | 2 refills | Status: DC

## 2016-01-31 MED ORDER — GABAPENTIN 600 MG PO TABS: 1200.0000 mg | ORAL_TABLET | Freq: Three times a day (TID) | ORAL | 3 refills | Status: DC

## 2016-01-31 NOTE — Progress Notes (Signed)
[name] Snow  MRN: [id number] DOB: [birth year]  Subjective:  Pt presents to clinic for medication refill for his Norco.  He saw the ortho and they are planning on ordering an MRI but the ortho thinks that it might be a nerve problem.  He has increased the gabapentin without problems - he is taking about 3600 a day - helps him sleep -- but he does not think that it helps the pain that much.  Toe infection is much better.  Review of Systems  Patient Active Problem List   Diagnosis Date Noted  . Chronic pain syndrome 01/17/2016  . Pain in joint, shoulder region 06/29/2015  . Hyperlipidemia 10/17/2014  . Arteriosclerosis 06/17/2014  . Lung nodule-CT 3/15 06/17/2014  . Hepatomegaly-steatosis 08/08/2013  . DM neuropathy, painful ([place]) 10/19/2011  . Hip pain, chronic 10/19/2011  . Onychomycosis 10/19/2011  . Nephrolithiasis 10/19/2011  . Carpal tunnel syndrome 10/19/2011  . Obesity, Class III, BMI 40-49.9 (morbid obesity) ([place]) 04/30/2011  . DM type 2, uncontrolled, with neuropathy ([place]) 04/30/2011  . Gout 04/30/2011  . 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 allopurinol (ZYLOPRIM) 300 MG tablet Take 1 tablet (300 mg total) by mouth daily. 90 tablet 3  . Blood Gluc Meter Disp-Strips (BLOOD GLUCOSE METER DISPOSABLE) DEVI 1 strip by Percutaneous route 2 (two) times daily before a meal. Test blood sugar once a day as directed. Dx Code: 250.00 180 each 3  . Blood Glucose Monitoring Suppl (BLOOD GLUCOSE MONITOR KIT) KIT Use as directed 1 each 0  . Blood Glucose Monitoring Suppl (ONE TOUCH ULTRA SYSTEM KIT) w/Device KIT Use as directed.  DX Code: E11.40 1 each 0  . clotrimazole-betamethasone (LOTRISONE) cream Apply 1 application topically 2 (two) times daily. 30 g 2  . gabapentin (NEURONTIN) 400 MG capsule One am, one midday, and 3 hs (Patient taking differently: 600 mg. One am, one midday, and 3 hs) 450  capsule 3  . glucose blood test strip Use as directed twice a day. DX Code: E11.40 100 each 12  . Insulin Syringe-Needle U-100 (INSULIN SYRINGE 1CC/31GX5/16") 31G X 5/16" 1 ML MISC Use as directed once a day. Dx Code: 250.00 100 each 1  . Lancets MISC Use as directed once day. Dx Code: 250.00 100 each 2  . lisinopril (PRINIVIL,ZESTRIL) 5 MG tablet Take 1 tablet (5 mg total) by mouth daily. 90 tablet 0  . metFORMIN (GLUCOPHAGE-XR) 500 MG 24 hr tablet Take 2 tablets (1,000 mg total) by mouth 2 (two) times daily. 120 tablet 0  . ONETOUCH DELICA LANCETS 34L MISC Use as directed twice a day. DX Code: E11.40 100 each 12  . rosuvastatin (CRESTOR) 20 MG tablet Take 1 tablet (20 mg total) by mouth daily. 30 tablet 0   No current facility-administered medications on file prior to visit.     No Known Allergies  Pt patients past, family and social history were reviewed and updated.  Objective:  BP 140/82 (BP Location: Right Arm, Patient Position: Sitting, Cuff Size: Small)   Pulse 72   Temp 98.2 F (36.8 C) (Oral)   Resp 18   Ht 5' 10.5" (1.791 m)   Wt 263 lb (119.3 kg)   SpO2 95%   BMI 37.20 kg/m   Physical Exam  Constitutional: He is oriented to person, place, and time and well-developed, well-nourished, and in no distress.  HENT:  Head: Normocephalic and atraumatic.  Right  Ear: External ear normal.  Left Ear: External ear normal.  Eyes: Conjunctivae are normal.  Neck: Normal range of motion.  Pulmonary/Chest: Effort normal.  Neurological: He is alert and oriented to person, place, and time. Gait normal.  Skin: Skin is warm and dry.  Psychiatric: Mood, memory, affect and judgment normal.    Assessment and Plan :  Chronic pain syndrome - Plan: HYDROcodone-acetaminophen (NORCO) 10-325 MG tablet, HYDROcodone-acetaminophen (NORCO) 10-325 MG tablet, HYDROcodone-acetaminophen (NORCO) 10-325 MG tablet  Diabetic polyneuropathy associated with type 2 diabetes mellitus ([place]) - Plan:  HYDROcodone-acetaminophen (NORCO) 10-325 MG tablet, HYDROcodone-acetaminophen (NORCO) 10-325 MG tablet, HYDROcodone-acetaminophen (NORCO) 10-325 MG tablet, gabapentin (NEURONTIN) 600 MG tablet  DM type 2, uncontrolled, with neuropathy ([place]) - Plan: Dulaglutide (TRULICITY) 7.93 PS/8.8AY SOPN  Windell Hummingbird PA-C  [facility] 01/31/2016 7:31 PM

## 2016-01-31 NOTE — Patient Instructions (Signed)
     IF you received an x-ray today, you will receive an invoice from [facility]. Please contact [facility] at [phone] with questions or concerns regarding your invoice.   IF you received labwork today, you will receive an invoice from [facility]. Please contact [facility] at [phone] with questions or concerns regarding your invoice.   Our billing staff will not be able to assist you with questions regarding bills from these companies.  You will be contacted with the lab results as soon as they are available. The fastest way to get your results is to activate your My Chart account. Instructions are located on the last page of this paperwork. If you have not heard from us regarding the results in 2 weeks, please contact this office.      

## 2016-03-24 ENCOUNTER — Inpatient Hospital Stay: Admission: RE | Admit: 2016-03-24 | Payer: PPO | Source: Ambulatory Visit

## 2016-03-24 ENCOUNTER — Other Ambulatory Visit: Payer: PPO

## 2016-03-26 ENCOUNTER — Ambulatory Visit (INDEPENDENT_AMBULATORY_CARE_PROVIDER_SITE_OTHER): Payer: PPO | Admitting: Orthopedic Surgery

## 2016-04-25 ENCOUNTER — Ambulatory Visit (INDEPENDENT_AMBULATORY_CARE_PROVIDER_SITE_OTHER): Payer: PPO | Admitting: Physician Assistant

## 2016-04-25 VITALS — BP 140/72 | HR 70 | Temp 99.1°F | Resp 18 | Ht 70.5 in | Wt 273.0 lb

## 2016-04-25 DIAGNOSIS — E1142 Type 2 diabetes mellitus with diabetic polyneuropathy: Secondary | ICD-10-CM

## 2016-04-25 DIAGNOSIS — G894 Chronic pain syndrome: Secondary | ICD-10-CM

## 2016-04-25 DIAGNOSIS — B356 Tinea cruris: Secondary | ICD-10-CM

## 2016-04-25 DIAGNOSIS — L97511 Non-pressure chronic ulcer of other part of right foot limited to breakdown of skin: Secondary | ICD-10-CM | POA: Diagnosis not present

## 2016-04-25 DIAGNOSIS — E1165 Type 2 diabetes mellitus with hyperglycemia: Secondary | ICD-10-CM | POA: Diagnosis not present

## 2016-04-25 DIAGNOSIS — M25512 Pain in left shoulder: Secondary | ICD-10-CM | POA: Diagnosis not present

## 2016-04-25 DIAGNOSIS — I10 Essential (primary) hypertension: Secondary | ICD-10-CM

## 2016-04-25 DIAGNOSIS — IMO0002 Reserved for concepts with insufficient information to code with codable children: Secondary | ICD-10-CM

## 2016-04-25 DIAGNOSIS — E1140 Type 2 diabetes mellitus with diabetic neuropathy, unspecified: Secondary | ICD-10-CM

## 2016-04-25 MED ORDER — AMITRIPTYLINE HCL 25 MG PO TABS
25.0000 mg | ORAL_TABLET | Freq: Every day | ORAL | 0 refills | Status: DC
Start: 2016-04-25 — End: 2016-07-22

## 2016-04-25 MED ORDER — HYDROCODONE-ACETAMINOPHEN 10-325 MG PO TABS: 1.0000 | ORAL_TABLET | ORAL | 0 refills | Status: DC | PRN

## 2016-04-25 MED ORDER — LISINOPRIL 10 MG PO TABS: 5.0000 mg | ORAL_TABLET | Freq: Every day | ORAL | 0 refills | Status: DC

## 2016-04-25 MED ORDER — CLOTRIMAZOLE-BETAMETHASONE 1-0.05 % EX CREA: 1.0000 "application " | TOPICAL_CREAM | Freq: Two times a day (BID) | CUTANEOUS | 2 refills | Status: DC

## 2016-04-25 NOTE — Progress Notes (Signed)
[name] Snow  MRN: [id number] DOB: [birth year]  Subjective:  Pt presents to clinic for multiple issues  Wound on top on right toe - has a scab - pig at home has been licking it and he hopes that it is not infected - he has no feeling in his foot  Glucose running 200s at home - taking his medications  Chronic pain - pain is not controlled - the DM neuropathy burning pain is worse and it is causing him decrease in sleep- he will sometimes take 7 pills a day but even that does not help.  He has been on Oxycontin and morphine in the past and it does not help - he has seen pain management 1 time in the past and he did not like the experience - he is not sure if he wants to go again - but the pain is getting worse - needs a refill  Saw orthopedic - trouble with shoulder - MRI/MRA scheduled but he had to cancel it but now he thinks that he will reschedule it sooner because his pain is so bad  Mother not doing well - she lives with the patient - and goes to adult day care - increase stress as a result and not sleeping well  Change pharmacy to [facility] in hopes that will be easier with his chronic pain medication   Review of Systems  Patient Active Problem List   Diagnosis Date Noted  . Chronic pain syndrome 01/17/2016  . Pain in joint, shoulder region 06/29/2015  . Hyperlipidemia 10/17/2014  . Arteriosclerosis 06/17/2014  . Lung nodule-CT 3/15 06/17/2014  . Hepatomegaly-steatosis 08/08/2013  . DM neuropathy, painful ([place]) 10/19/2011  . Hip pain, chronic 10/19/2011  . Onychomycosis 10/19/2011  . Nephrolithiasis 10/19/2011  . Carpal tunnel syndrome 10/19/2011  . Obesity, Class III, BMI 40-49.9 (morbid obesity) ([place]) 04/30/2011  . DM type 2, uncontrolled, with neuropathy ([place]) 04/30/2011  . Gout 04/30/2011  . 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 allopurinol (ZYLOPRIM) 300 MG tablet Take 1  tablet (300 mg total) by mouth daily. 90 tablet 3  . Blood Gluc Meter Disp-Strips (BLOOD GLUCOSE METER DISPOSABLE) DEVI 1 strip by Percutaneous route 2 (two) times daily before a meal. Test blood sugar once a day as directed. Dx Code: 250.00 180 each 3  . Blood Glucose Monitoring Suppl (BLOOD GLUCOSE MONITOR KIT) KIT Use as directed 1 each 0  . Blood Glucose Monitoring Suppl (ONE TOUCH ULTRA SYSTEM KIT) w/Device KIT Use as directed.  DX Code: E11.40 1 each 0  . Dulaglutide (TRULICITY) 4.74 QV/9.5GL SOPN Inject 0.75 mg into the skin once a week. 4 pen 2  . gabapentin (NEURONTIN) 600 MG tablet Take 2 tablets (1,200 mg total) by mouth 3 (three) times daily. 540 tablet 3  . glucose blood test strip Use as directed twice a day. DX Code: E11.40 100 each 12  . Insulin Syringe-Needle U-100 (INSULIN SYRINGE 1CC/31GX5/16") 31G X 5/16" 1 ML MISC Use as directed once a day. Dx Code: 250.00 100 each 1  . Lancets MISC Use as directed once day. Dx Code: 250.00 100 each 2  . metFORMIN (GLUCOPHAGE-XR) 500 MG 24 hr tablet Take 2 tablets (1,000 mg total) by mouth 2 (two) times daily. 120 tablet 0  . ONETOUCH DELICA LANCETS 87F MISC Use as directed twice a day. DX Code: E11.40 100 each 12  . rosuvastatin (CRESTOR) 20  MG tablet Take 1 tablet (20 mg total) by mouth daily. 30 tablet 0   No current facility-administered medications on file prior to visit.     No Known Allergies  Pt patients past, family and social history were reviewed and updated.   Objective:  BP 140/72   Pulse 70   Temp 99.1 F (37.3 C) (Oral)   Resp 18   Ht 5' 10.5" (1.791 m)   Wt 273 lb (123.8 kg)   SpO2 97%   BMI 38.62 kg/m   Physical Exam  Constitutional: He is oriented to person, place, and time and well-developed, well-nourished, and in no distress.  HENT:  Head: Normocephalic and atraumatic.  Right Ear: External ear normal.  Left Ear: External ear normal.  Eyes: Conjunctivae are normal.  Neck: Normal range of motion.    Pulmonary/Chest: Effort normal.  Neurological: He is alert and oriented to person, place, and time. Gait normal.  Skin: Skin is warm and dry.  Right great toe - eschar removed from top of toe - no surrounding erythema- xeroform gauze placed on the wound - medial aspect of great toe a corn that has some blood around it - no surrounding erythema - bottom of toe - an ulceration with debride in it - it is removed there is no surrounding erythema  Psychiatric: Mood, memory, affect and judgment normal.    Assessment and Plan :  DM type 2, uncontrolled, with neuropathy ([place]) - Plan: CMP14+EGFR, Hemoglobin A1c - check labs - pt A1c is slightly improved - he should increase his trulicity to 1.'5mg'$  Rx sent to pharmacy  Diabetic polyneuropathy associated with type 2 diabetes mellitus ([place]) - Plan: HYDROcodone-acetaminophen (NORCO) 10-325 MG tablet, HYDROcodone-acetaminophen (NORCO) 10-325 MG tablet, HYDROcodone-acetaminophen (NORCO) 10-325 MG tablet, amitriptyline (ELAVIL) 25 MG tablet - refilled pain medications - d/w pt that he is at the top level of gabapentin and he is taking regularly and he still is having increased pain - better control of his DM will help with this worsening - we will start Elavil expect patient to need a high dose but will start low and see if he tolerates - he will contact me in a month and if he is tolerating well we will increase dose - offered pain management referral and at this time he is not interested  Pain in joint of left shoulder - continue work-up treatment with ortho  Chronic pain syndrome - Plan: HYDROcodone-acetaminophen (NORCO) 10-325 MG tablet, HYDROcodone-acetaminophen (NORCO) 10-325 MG tablet, HYDROcodone-acetaminophen (NORCO) 10-325 MG tablet  Hypertension, unspecified type - Plan: lisinopril (PRINIVIL,ZESTRIL) 10 MG tablet - increase dose as his BP is not well controlled  Tinea cruris - Plan: clotrimazole-betamethasone (LOTRISONE) cream - refilled cream and this  has helped him in the past  Toe ulceration/wounf - continue to closely watch - change xeroform daily - if any signs of infection RTC.  Windell Hummingbird PA-C  [facility] 04/29/2016 6:09 PM

## 2016-04-25 NOTE — Patient Instructions (Addendum)
Continue gabapenitin  Watch toe very carefully - currently not infected - keep yellow dressing on the top one to see if we can get it healed - if it starts to get red please come see me  Add Elavil at night to see if that will help with your pain - this small dose may not decrease the pain but we can increase the dose as tolerated - so in 2 weeks if you are tolerating let me know and we can increase the dose - I only wrote you a 30d supply to make sure you tolerate the medication.    IF you received an x-ray today, you will [facility]. Please contact [facility] at [phone] with questions or concerns regarding your invoice.   IF you received labwork today, you will receive an invoice from [facility]. Please contact [facility] at [phone] with questions or concerns regarding your invoice.   Our billing staff will not be able to assist you with questions regarding bills from these companies.  You will be contacted with the lab results as soon as they are available. The fastest way to get your results is to activate your My Chart account. Instructions are located on the last page of this paperwork. If you have not heard from us regarding the results in 2 weeks, please contact this office.

## 2016-04-26 LAB — CMP14+EGFR
A/G RATIO: 1.2 (ref 1.2–2.2)
ALT: 13 IU/L (ref 0–44)
AST: 13 IU/L (ref 0–40)
Albumin: 4.1 g/dL (ref 3.5–5.5)
Alkaline Phosphatase: 89 IU/L (ref 39–117)
BUN/Creatinine Ratio: 25 — ABNORMAL HIGH (ref 9–20)
BUN: 26 mg/dL — ABNORMAL HIGH (ref 6–24)
Bilirubin Total: 0.5 mg/dL (ref 0.0–1.2)
CALCIUM: 9.3 mg/dL (ref 8.7–10.2)
CO2: 24 mmol/L (ref 18–29)
CREATININE: 1.05 mg/dL (ref 0.76–1.27)
Chloride: 95 mmol/L — ABNORMAL LOW (ref 96–106)
GFR, EST AFRICAN AMERICAN: 94 mL/min/{1.73_m2} (ref >59)
GFR, EST NON AFRICAN AMERICAN: 81 mL/min/{1.73_m2} (ref >59)
Globulin, Total: 3.4 g/dL (ref 1.5–4.5)
Glucose: 372 mg/dL — ABNORMAL HIGH (ref 65–99)
POTASSIUM: 4.7 mmol/L (ref 3.5–5.2)
Sodium: 137 mmol/L (ref 134–144)
TOTAL PROTEIN: 7.5 g/dL (ref 6.0–8.5)

## 2016-04-26 LAB — HEMOGLOBIN A1C
ESTIMATED AVERAGE GLUCOSE: 229 mg/dL
Hgb A1c MFr Bld: 9.6 % — ABNORMAL HIGH (ref 4.8–5.6)

## 2016-04-29 MED ORDER — DULAGLUTIDE 1.5 MG/0.5ML ~~LOC~~ SOAJ: 1.5000 mg | SUBCUTANEOUS | 2 refills | Status: DC

## 2016-05-09 ENCOUNTER — Ambulatory Visit: Payer: PPO

## 2016-05-13 ENCOUNTER — Encounter: Payer: Self-pay | Admitting: Physician Assistant

## 2016-07-17 ENCOUNTER — Ambulatory Visit (INDEPENDENT_AMBULATORY_CARE_PROVIDER_SITE_OTHER): Payer: PPO

## 2016-07-17 ENCOUNTER — Encounter: Payer: Self-pay | Admitting: Physician Assistant

## 2016-07-17 ENCOUNTER — Ambulatory Visit (INDEPENDENT_AMBULATORY_CARE_PROVIDER_SITE_OTHER): Payer: PPO | Admitting: Physician Assistant

## 2016-07-17 VITALS — BP 155/76 | HR 69 | Temp 99.2°F | Resp 16 | Ht 70.0 in | Wt 277.0 lb

## 2016-07-17 DIAGNOSIS — L02619 Cutaneous abscess of unspecified foot: Secondary | ICD-10-CM

## 2016-07-17 DIAGNOSIS — L03119 Cellulitis of unspecified part of limb: Secondary | ICD-10-CM | POA: Diagnosis not present

## 2016-07-17 DIAGNOSIS — L02611 Cutaneous abscess of right foot: Secondary | ICD-10-CM | POA: Diagnosis not present

## 2016-07-17 DIAGNOSIS — M7989 Other specified soft tissue disorders: Secondary | ICD-10-CM | POA: Diagnosis not present

## 2016-07-17 MED ORDER — AMOXICILLIN-POT CLAVULANATE 875-125 MG PO TABS: 1.0000 | ORAL_TABLET | Freq: Two times a day (BID) | ORAL | 0 refills | Status: DC

## 2016-07-17 NOTE — Patient Instructions (Signed)
     IF you received an x-ray today, you will receive an invoice from [facility]. Please contact [facility] at [phone] with questions or concerns regarding your invoice.   IF you received labwork today, you will receive an invoice from [facility]. Please contact [facility] at [phone] with questions or concerns regarding your invoice.   Our billing staff will not be able to assist you with questions regarding bills from these companies.  You will be contacted with the lab results as soon as they are available. The fastest way to get your results is to activate your My Chart account. Instructions are located on the last page of this paperwork. If you have not heard from us regarding the results in 2 weeks, please contact this office.     

## 2016-07-17 NOTE — Progress Notes (Signed)
[name] Snow  MRN: [id number] DOB: 1965/07/05  Subjective:  Pt presents to clinic with right foot red and swollen.  On Monday evening his foot looked fine and then Tuesday when he woke up he noticed a large blister slightly bigger than a quarter on the 5th MTP area.  He has almost no sensation in his feet but he does not remember an injury to the area.  Then yesterday he noticed his foot was swollen and since then it has gotten red.  He has no fevers and does not feel poorly.  His sugars are in the high 200s.  Pt has been up on his feet for at least the last 6 hours without any elevation.  Review of Systems  Constitutional: Negative for chills and fever.  Musculoskeletal: Negative for gait problem.    Patient Active Problem List   Diagnosis Date Noted  . Chronic pain syndrome 01/17/2016  . Pain in joint, shoulder region 06/29/2015  . Hyperlipidemia 10/17/2014  . Arteriosclerosis 06/17/2014  . Lung nodule-CT 3/15 06/17/2014  . Hepatomegaly-steatosis 08/08/2013  . DM neuropathy, painful ([place]) 10/19/2011  . Hip pain, chronic 10/19/2011  . Onychomycosis 10/19/2011  . Nephrolithiasis 10/19/2011  . Carpal tunnel syndrome 10/19/2011  . Obesity, Class III, BMI 40-49.9 (morbid obesity) ([place]) 04/30/2011  . DM type 2, uncontrolled, with neuropathy ([place]) 04/30/2011  . Gout 04/30/2011  . 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 allopurinol (ZYLOPRIM) 300 MG tablet Take 1 tablet (300 mg total) by mouth daily. 90 tablet 3  . amitriptyline (ELAVIL) 25 MG tablet Take 1 tablet (25 mg total) by mouth at bedtime. 30 tablet 0  . Blood Gluc Meter Disp-Strips (BLOOD GLUCOSE METER DISPOSABLE) DEVI 1 strip by Percutaneous route 2 (two) times daily before a meal. Test blood sugar once a day as directed. Dx Code: 250.00 180 each 3  . Blood Glucose Monitoring Suppl (BLOOD GLUCOSE MONITOR KIT) KIT Use as directed 1 each  0  . Blood Glucose Monitoring Suppl (ONE TOUCH ULTRA SYSTEM KIT) w/Device KIT Use as directed.  DX Code: E11.40 1 each 0  . clotrimazole-betamethasone (LOTRISONE) cream Apply 1 application topically 2 (two) times daily. 30 g 2  . Dulaglutide (TRULICITY) 6.54 YT/0.3TW SOPN Inject 0.75 mg into the skin once a week. 4 pen 2  . Dulaglutide (TRULICITY) 1.5 SF/6.8LE SOPN Inject 1.5 mg into the skin once a week. 4 pen 2  . gabapentin (NEURONTIN) 600 MG tablet Take 2 tablets (1,200 mg total) by mouth 3 (three) times daily. 540 tablet 3  . glucose blood test strip Use as directed twice a day. DX Code: E11.40 100 each 12  . HYDROcodone-acetaminophen (NORCO) 10-325 MG tablet Take 1 tablet by mouth every 4 (four) hours as needed. 180 tablet 0  . HYDROcodone-acetaminophen (NORCO) 10-325 MG tablet Take 1 tablet by mouth every 4 (four) hours as needed. 180 tablet 0  . HYDROcodone-acetaminophen (NORCO) 10-325 MG tablet Take 1 tablet by mouth every 4 (four) hours as needed. 180 tablet 0  . Insulin Syringe-Needle U-100 (INSULIN SYRINGE 1CC/31GX5/16") 31G X 5/16" 1 ML MISC Use as directed once a day. Dx Code: 250.00 100 each 1  . Lancets MISC Use as directed once day. Dx Code: 250.00 100 each 2  . lisinopril (PRINIVIL,ZESTRIL) 10 MG tablet Take 0.5 tablets (5 mg total) by mouth daily. 90 tablet 0  . metFORMIN (GLUCOPHAGE-XR) 500 MG 24 hr tablet  Take 2 tablets (1,000 mg total) by mouth 2 (two) times daily. 120 tablet 0  . ONETOUCH DELICA LANCETS 57D MISC Use as directed twice a day. DX Code: E11.40 100 each 12  . rosuvastatin (CRESTOR) 20 MG tablet Take 1 tablet (20 mg total) by mouth daily. 30 tablet 0   No current facility-administered medications on file prior to visit.     No Known Allergies  Pt patients past, family and social history were reviewed and updated.   Objective:  BP (!) 155/76   Pulse 69   Temp 99.2 F (37.3 C) (Oral)   Resp 16   Ht '5\' 10"'$  (1.778 m)   Wt 277 lb (125.6 kg)   SpO2 96%    BMI 39.75 kg/m   Physical Exam  Constitutional: He is oriented to person, place, and time and well-developed, well-nourished, and in no distress.  HENT:  Head: Normocephalic and atraumatic.  Right Ear: External ear normal.  Left Ear: External ear normal.  Eyes: Conjunctivae are normal.  Neck: Normal range of motion.  Pulmonary/Chest: Effort normal.  Neurological: He is alert and oriented to person, place, and time. Gait normal.  Skin: Skin is warm and dry.  Right foot over lateral 5th MTP - 3 cm unroofed area of skin with granulation tissue and small amount of yellow eschar without ulceration. Serous drainage from the wound. Erythema extends to the 2nd metatarsal area and to the ankle on the lateral side of the foot.  He has 1-2+ pitting edema 3/4 of shin.  Good DP pulses.  Lesions on the great toe that has been present for months.  No surrounding erythema noted.  Psychiatric: Mood, memory, affect and judgment normal.    Results for orders placed or performed in visit on 07/17/16  CBC with Differential/Platelet  Result Value Ref Range   WBC 10.0 3.4 - 10.8 x10E3/uL   RBC 4.14 4.14 - 5.80 x10E6/uL   Hemoglobin 11.8 (L) 13.0 - 17.7 g/dL   Hematocrit 35.3 (L) 37.5 - 51.0 %   MCV 85 79 - 97 fL   MCH 28.5 26.6 - 33.0 pg   MCHC 33.4 31.5 - 35.7 g/dL   RDW 14.4 12.3 - 15.4 %   Platelets 301 150 - 379 x10E3/uL   Neutrophils 63 Not Estab. %   Lymphs 28 Not Estab. %   Monocytes 6 Not Estab. %   Eos 2 Not Estab. %   Basos 1 Not Estab. %   Neutrophils Absolute 6.4 1.4 - 7.0 x10E3/uL   Lymphocytes Absolute 2.7 0.7 - 3.1 x10E3/uL   Monocytes Absolute 0.6 0.1 - 0.9 x10E3/uL   EOS (ABSOLUTE) 0.2 0.0 - 0.4 x10E3/uL   Basophils Absolute 0.1 0.0 - 0.2 x10E3/uL   Immature Granulocytes 0 Not Estab. %   Immature Grans (Abs) 0.0 0.0 - 0.1 x10E3/uL     Dg Foot 2 Views Right  Result Date: 07/17/2016 CLINICAL DATA:  Cellulitis and abscess.  Diabetes. EXAM: RIGHT FOOT - 2 VIEW COMPARISON:   12/14/2015 radiographs of the right foot FINDINGS: Small plantar soft tissue ulcer along the base of the great toe. Tiny 4 mm linear needle-like metallic foreign body is again seen adjacent to the tuft of the great toe medially, unchanged in the position. No frank bone destruction. Osteoarthritic joint space narrowing is identified of the interphalangeal joint of the great toe and midfoot articulations dorsally. Diffuse soft tissue swelling consistent with soft tissue edema and/or cellulitis is noted of the visualized foot, ankle and  distal leg. Plantar and dorsal calcaneal enthesophytes are noted. IMPRESSION: 1. Diffuse soft tissue swelling of the visualized foot, ankle and distal leg. 2. No underlying fracture nor bone destruction. Soft tissue ulcer along the base of the great toe along the plantar aspect. No findings of osteomyelitis. 3. Tiny 4 mm linear foreign body in the soft tissues of the great toe as before. Electronically Signed   By: Ashley Royalty M.D.   On: 07/17/2016 18:30    Assessment and Plan :  Cellulitis and abscess of foot - Plan: CBC with Differential/Platelet, DG Foot 2 Views Right, amoxicillin-clavulanate (AUGMENTIN) 875-125 MG tablet  Right leg swelling   Pt needs to elevate foot to reduce swelling.  He is to monitor his fever and blood sugars esp since he has an infection.  He has no sensation in his feet so he is going to have to visually monitor for changes - worsening erythema -- RTC sooner than his appt in 5 days.  Keep the wound covered with a nonstick bandage.  Windell Hummingbird PA-C  [facility] 07/18/2016 3:45 PM

## 2016-07-18 ENCOUNTER — Telehealth: Payer: Self-pay | Admitting: Family Medicine

## 2016-07-18 LAB — CBC WITH DIFFERENTIAL/PLATELET
BASOS ABS: 0.1 10*3/uL (ref 0.0–0.2)
BASOS: 1 %
EOS (ABSOLUTE): 0.2 10*3/uL (ref 0.0–0.4)
Eos: 2 %
Hematocrit: 35.3 % — ABNORMAL LOW (ref 37.5–51.0)
Hemoglobin: 11.8 g/dL — ABNORMAL LOW (ref 13.0–17.7)
Immature Grans (Abs): 0 10*3/uL (ref 0.0–0.1)
Immature Granulocytes: 0 %
LYMPHS ABS: 2.7 10*3/uL (ref 0.7–3.1)
LYMPHS: 28 %
MCH: 28.5 pg (ref 26.6–33.0)
MCHC: 33.4 g/dL (ref 31.5–35.7)
MCV: 85 fL (ref 79–97)
MONOS ABS: 0.6 10*3/uL (ref 0.1–0.9)
Monocytes: 6 %
NEUTROS ABS: 6.4 10*3/uL (ref 1.4–7.0)
Neutrophils: 63 %
PLATELETS: 301 10*3/uL (ref 150–379)
RBC: 4.14 x10E6/uL (ref 4.14–5.80)
RDW: 14.4 % (ref 12.3–15.4)
WBC: 10 10*3/uL (ref 3.4–10.8)

## 2016-07-18 NOTE — Telephone Encounter (Signed)
Pt called about lab results gave him message Maralyn SagoSarah left for him pt understands

## 2016-07-22 ENCOUNTER — Encounter: Payer: Self-pay | Admitting: Physician Assistant

## 2016-07-22 ENCOUNTER — Ambulatory Visit (INDEPENDENT_AMBULATORY_CARE_PROVIDER_SITE_OTHER): Payer: PPO | Admitting: Physician Assistant

## 2016-07-22 VITALS — BP 138/77 | HR 56 | Temp 98.1°F | Resp 16 | Ht 70.0 in | Wt 276.0 lb

## 2016-07-22 DIAGNOSIS — M25559 Pain in unspecified hip: Secondary | ICD-10-CM

## 2016-07-22 DIAGNOSIS — E1140 Type 2 diabetes mellitus with diabetic neuropathy, unspecified: Secondary | ICD-10-CM | POA: Diagnosis not present

## 2016-07-22 DIAGNOSIS — IMO0002 Reserved for concepts with insufficient information to code with codable children: Secondary | ICD-10-CM

## 2016-07-22 DIAGNOSIS — E7800 Pure hypercholesterolemia, unspecified: Secondary | ICD-10-CM | POA: Diagnosis not present

## 2016-07-22 DIAGNOSIS — Z1211 Encounter for screening for malignant neoplasm of colon: Secondary | ICD-10-CM | POA: Diagnosis not present

## 2016-07-22 DIAGNOSIS — I10 Essential (primary) hypertension: Secondary | ICD-10-CM | POA: Insufficient documentation

## 2016-07-22 DIAGNOSIS — E1165 Type 2 diabetes mellitus with hyperglycemia: Secondary | ICD-10-CM

## 2016-07-22 DIAGNOSIS — E1142 Type 2 diabetes mellitus with diabetic polyneuropathy: Secondary | ICD-10-CM | POA: Diagnosis not present

## 2016-07-22 DIAGNOSIS — L02619 Cutaneous abscess of unspecified foot: Secondary | ICD-10-CM | POA: Diagnosis not present

## 2016-07-22 DIAGNOSIS — G8929 Other chronic pain: Secondary | ICD-10-CM

## 2016-07-22 DIAGNOSIS — G894 Chronic pain syndrome: Secondary | ICD-10-CM | POA: Diagnosis not present

## 2016-07-22 DIAGNOSIS — L03119 Cellulitis of unspecified part of limb: Secondary | ICD-10-CM

## 2016-07-22 MED ORDER — AMITRIPTYLINE HCL 100 MG PO TABS: 100.0000 mg | ORAL_TABLET | Freq: Every day | ORAL | 0 refills | Status: DC

## 2016-07-22 MED ORDER — GABAPENTIN 600 MG PO TABS: 1200.0000 mg | ORAL_TABLET | Freq: Three times a day (TID) | ORAL | 3 refills | Status: DC

## 2016-07-22 MED ORDER — METFORMIN HCL ER 500 MG PO TB24: 1000.0000 mg | ORAL_TABLET | Freq: Two times a day (BID) | ORAL | 0 refills | Status: DC

## 2016-07-22 MED ORDER — HYDROCODONE-ACETAMINOPHEN 10-325 MG PO TABS: 1.0000 | ORAL_TABLET | ORAL | 0 refills | Status: DC | PRN

## 2016-07-22 MED ORDER — ROSUVASTATIN CALCIUM 20 MG PO TABS: 20.0000 mg | ORAL_TABLET | Freq: Every day | ORAL | 0 refills | Status: DC

## 2016-07-22 MED ORDER — GLUCOSE BLOOD VI STRP: ORAL_STRIP | 4 refills | Status: DC

## 2016-07-22 MED ORDER — INSULIN GLARGINE 100 UNIT/ML SOLOSTAR PEN: 30.0000 [IU] | PEN_INJECTOR | Freq: Two times a day (BID) | SUBCUTANEOUS | 99 refills | Status: DC

## 2016-07-22 NOTE — Progress Notes (Signed)
[name] Snow MRN: [id number] DOB: [birth year]  PCP: Windell Hummingbird PA-C  Subjective:  Pt presents to clinic for  Chief Complaint  Patient presents with  . Follow-up    abscess of foot  . Medication Refill    gabepentin and hydrocodone   DM: 220s am fasting -- use Lantus 10U bid and Trulicity 7.25 - he has not filled the 1.'5mg'$  but he is out of his 7.'5mg'$  - he has not taken his dose this week - he is not eating well - he went to eye doctor   Foot ulcer: taking abx - doing much better less swelling and erythema. - no problems with abx - he has not been able to keep it covered due to the location - getting much better - no F/C.  The ulceration on the bottom of his right great toe is still present - cannot get it to heal.  Chronic pain syndrome: - doing well on medications - gets it every 3 months, Elavil '100mg'$  helped at night with the pain  - he has run out because he increased the dose until he got relief.  Review of Systems  Constitutional: Negative for chills and fever.  Respiratory: Negative for cough and shortness of breath.   Cardiovascular: Negative for chest pain, palpitations and leg swelling.  Gastrointestinal: Negative for nausea.  Skin: Positive for wound.       No feeling in his feet - right great toe 2 ulceration that he has been dealing with for months.  Neurological: Negative for numbness.    Patient Active Problem List   Diagnosis Date Noted  . HTN (hypertension) 07/22/2016  . Chronic pain syndrome 01/17/2016  . Pain in joint, shoulder region 06/29/2015  . Hyperlipidemia 10/17/2014  . Arteriosclerosis 06/17/2014  . Lung nodule-CT 3/15 06/17/2014  . Hepatomegaly-steatosis 08/08/2013  . DM neuropathy, painful ([place]) 10/19/2011  . Hip pain, chronic 10/19/2011  . Onychomycosis 10/19/2011  . Nephrolithiasis 10/19/2011  . Carpal tunnel syndrome 10/19/2011  . Obesity, Class III, BMI 40-49.9 (morbid obesity) ([place]) 04/30/2011  . DM type 2, uncontrolled, with neuropathy  ([place]) 04/30/2011  . Gout 04/30/2011  . 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 allopurinol (ZYLOPRIM) 300 MG tablet Take 1 tablet (300 mg total) by mouth daily. 90 tablet 3  . amoxicillin-clavulanate (AUGMENTIN) 875-125 MG tablet Take 1 tablet by mouth 2 (two) times daily. 20 tablet 0  . Blood Glucose Monitoring Suppl (ONE TOUCH ULTRA SYSTEM KIT) w/Device KIT Use as directed.  DX Code: E11.40 1 each 0  . clotrimazole-betamethasone (LOTRISONE) cream Apply 1 application topically 2 (two) times daily. 30 g 2  . Dulaglutide (TRULICITY) 3.66 YQ/0.3KV SOPN Inject 0.75 mg into the skin once a week. 4 pen 2  . Insulin Syringe-Needle U-100 (INSULIN SYRINGE 1CC/31GX5/16") 31G X 5/16" 1 ML MISC Use as directed once a day. Dx Code: 250.00 100 each 1  . lisinopril (PRINIVIL,ZESTRIL) 10 MG tablet Take 0.5 tablets (5 mg total) by mouth daily. 90 tablet 0  . ONETOUCH DELICA LANCETS 42V MISC Use as directed twice a day. DX Code: E11.40 100 each 12   No current facility-administered medications on file prior to visit.     No Known Allergies  Pt patients past, family and social history were reviewed and updated.   Objective:  BP 138/77   Pulse (!) 56   Temp 98.1 F (36.7 C) (Oral)   Resp  16   Ht '5\' 10"'$  (1.778 m)   Wt 276 lb (125.2 kg)   SpO2 96%   BMI 39.60 kg/m   Physical Exam  Constitutional: He is oriented to person, place, and time and well-developed, well-nourished, and in no distress.  HENT:  Head: Normocephalic and atraumatic.  Right Ear: External ear normal.  Left Ear: External ear normal.  Eyes: Conjunctivae are normal.  Neck: Normal range of motion.  Cardiovascular: Normal rate, regular rhythm, normal heart sounds and intact distal pulses.   Pulmonary/Chest: Effort normal and breath sounds normal. He has no wheezes.  Musculoskeletal:       Right lower leg: He exhibits no edema.       Left lower  leg: He exhibits no edema.  Neurological: He is alert and oriented to person, place, and time. Gait normal.  Skin: Skin is warm and dry.  Non-healing ulceration on the right great toe on the bottom - not in the location of the FB seen on xray.  Medial aspect of toe healing wound and on top of right great toe wound that has delayed healing.  On right 5th MTP shallow ulceration with some eschar formation on the most distal aspect.  Erythema and swelling have resolved since his last visit.  Psychiatric: Mood, memory, affect and judgment normal.    Assessment and Plan :  DM type 2, uncontrolled, with neuropathy ([place]) - check labs - adjust trulicity as indicated by lab results -- pt to get me a name of the cardiologist he would like to see. - Plan: CMP14+EGFR, Hemoglobin A1c, Microalbumin, urine, glucose blood test strip, Insulin Glargine (LANTUS SOLOSTAR) 100 UNIT/ML Solostar Pen, metFORMIN (GLUCOPHAGE-XR) 500 MG 24 hr tablet - pt to continue checking his glucose at home - I would like for tighter DM control esp given his current neuropathy state.  He will contact his eye MD to have them send me results.    Diabetic polyneuropathy associated with type 2 diabetes mellitus ([place]) - Plan: gabapentin (NEURONTIN) 600 MG tablet, HYDROcodone-acetaminophen (NORCO) 10-325 MG tablet, HYDROcodone-acetaminophen (NORCO) 10-325 MG tablet, HYDROcodone-acetaminophen (NORCO) 10-325 MG tablet  Obesity, Class III, BMI 40-49.9 (morbid obesity) ([facility])  Chronic hip pain, unspecified laterality  Chronic pain syndrome - medications refilled for the next 3 months. - Plan: amitriptyline (ELAVIL) 100 MG tablet, HYDROcodone-acetaminophen (NORCO) 10-325 MG tablet, HYDROcodone-acetaminophen (NORCO) 10-325 MG tablet, HYDROcodone-acetaminophen (NORCO) 10-325 MG tablet - continue current medication - increase Elavil to '100mg'$  at night as that helped him when I gave it to him last visit.  Pure hypercholesterolemia - Plan: Lipid panel,  rosuvastatin (CRESTOR) 20 MG tablet - check labs - continue current medication adjust dose it needed  Essential hypertension - controlled on medications  Cellulitis and abscess of foot - improving - he will finish the abx - he needs to keep the wound clean and dry to allow for healing - he should be watchful for non healing signs and RTC  - Plan: CBC with Differential/Platelet  Screen for colon cancer - pt canceled his last appt last year but he is interested in having this done again - Plan: Ambulatory referral to Gastroenterology   Windell Hummingbird PA-C  [facility] 07/22/2016 10:41 AM

## 2016-07-22 NOTE — Patient Instructions (Signed)
     IF you received an x-ray today, you will receive an invoice from [facility]. Please contact [facility] at [phone] with questions or concerns regarding your invoice.   IF you received labwork today, you will receive an invoice from [facility]. Please contact [facility] at [phone] with questions or concerns regarding your invoice.   Our billing staff will not be able to assist you with questions regarding bills from these companies.  You will be contacted with the lab results as soon as they are available. The fastest way to get your results is to activate your My Chart account. Instructions are located on the last page of this paperwork. If you have not heard from us regarding the results in 2 weeks, please contact this office.     

## 2016-07-23 LAB — CBC WITH DIFFERENTIAL/PLATELET
Basophils Absolute: 0.1 10*3/uL (ref 0.0–0.2)
Basos: 1 %
EOS (ABSOLUTE): 0.2 10*3/uL (ref 0.0–0.4)
EOS: 2 %
HEMATOCRIT: 35.5 % — AB (ref 37.5–51.0)
HEMOGLOBIN: 11.3 g/dL — AB (ref 13.0–17.7)
IMMATURE GRANS (ABS): 0 10*3/uL (ref 0.0–0.1)
IMMATURE GRANULOCYTES: 0 %
LYMPHS: 30 %
Lymphocytes Absolute: 2.5 10*3/uL (ref 0.7–3.1)
MCH: 28.2 pg (ref 26.6–33.0)
MCHC: 31.8 g/dL (ref 31.5–35.7)
MCV: 89 fL (ref 79–97)
MONOCYTES: 5 %
Monocytes Absolute: 0.4 10*3/uL (ref 0.1–0.9)
NEUTROS PCT: 62 %
Neutrophils Absolute: 5.1 10*3/uL (ref 1.4–7.0)
Platelets: 310 10*3/uL (ref 150–379)
RBC: 4.01 x10E6/uL — ABNORMAL LOW (ref 4.14–5.80)
RDW: 14.5 % (ref 12.3–15.4)
WBC: 8.3 10*3/uL (ref 3.4–10.8)

## 2016-07-23 LAB — CMP14+EGFR
A/G RATIO: 1.1 — AB (ref 1.2–2.2)
ALK PHOS: 81 IU/L (ref 39–117)
ALT: 12 IU/L (ref 0–44)
AST: 10 IU/L (ref 0–40)
Albumin: 3.9 g/dL (ref 3.5–5.5)
BILIRUBIN TOTAL: 0.2 mg/dL (ref 0.0–1.2)
BUN/Creatinine Ratio: 19 (ref 9–20)
BUN: 24 mg/dL (ref 6–24)
CALCIUM: 9.3 mg/dL (ref 8.7–10.2)
CHLORIDE: 97 mmol/L (ref 96–106)
CO2: 21 mmol/L (ref 18–29)
Creatinine, Ser: 1.29 mg/dL — ABNORMAL HIGH (ref 0.76–1.27)
GFR calc Af Amer: 73 mL/min/{1.73_m2} (ref >59)
GFR calc non Af Amer: 63 mL/min/{1.73_m2} (ref >59)
GLOBULIN, TOTAL: 3.6 g/dL (ref 1.5–4.5)
Glucose: 299 mg/dL — ABNORMAL HIGH (ref 65–99)
POTASSIUM: 5 mmol/L (ref 3.5–5.2)
SODIUM: 135 mmol/L (ref 134–144)
Total Protein: 7.5 g/dL (ref 6.0–8.5)

## 2016-07-23 LAB — HEMOGLOBIN A1C
Est. average glucose Bld gHb Est-mCnc: 232 mg/dL
Hgb A1c MFr Bld: 9.7 % — ABNORMAL HIGH (ref 4.8–5.6)

## 2016-07-23 LAB — MICROALBUMIN, URINE: MICROALBUM., U, RANDOM: 36 ug/mL

## 2016-07-23 LAB — LIPID PANEL
Chol/HDL Ratio: 6.3 ratio units — ABNORMAL HIGH (ref 0.0–5.0)
Cholesterol, Total: 208 mg/dL — ABNORMAL HIGH (ref 100–199)
HDL: 33 mg/dL — ABNORMAL LOW (ref >39)
LDL Calculated: 125 mg/dL — ABNORMAL HIGH (ref 0–99)
Triglycerides: 251 mg/dL — ABNORMAL HIGH (ref 0–149)
VLDL Cholesterol Cal: 50 mg/dL — ABNORMAL HIGH (ref 5–40)

## 2016-07-24 MED ORDER — DULAGLUTIDE 1.5 MG/0.5ML ~~LOC~~ SOAJ: 1.5000 mg | SUBCUTANEOUS | 0 refills | Status: DC

## 2016-07-24 NOTE — Addendum Note (Signed)
Addended by: Morrell RiddleWEBER, Jilliane Kazanjian L on: 07/24/2016 05:59 PM   Modules accepted: Orders

## 2016-08-18 ENCOUNTER — Ambulatory Visit: Payer: PPO

## 2016-08-19 ENCOUNTER — Ambulatory Visit: Payer: PPO

## 2016-08-22 ENCOUNTER — Ambulatory Visit (INDEPENDENT_AMBULATORY_CARE_PROVIDER_SITE_OTHER): Payer: PPO | Admitting: Urgent Care

## 2016-08-22 ENCOUNTER — Inpatient Hospital Stay (HOSPITAL_COMMUNITY): Payer: PPO

## 2016-08-22 ENCOUNTER — Inpatient Hospital Stay (HOSPITAL_COMMUNITY)
Admission: EM | Admit: 2016-08-22 | Discharge: 2016-08-31 | DRG: 854 | Disposition: A | Discharge status: Home Health | Payer: PPO | Attending: Internal Medicine | Admitting: Internal Medicine

## 2016-08-22 ENCOUNTER — Encounter (HOSPITAL_COMMUNITY): Payer: Self-pay | Admitting: *Deleted

## 2016-08-22 VITALS — BP 145/78 | HR 86 | Temp 100.2°F | Resp 18 | Ht 70.0 in | Wt 262.0 lb

## 2016-08-22 DIAGNOSIS — N183 Chronic kidney disease, stage 3 (moderate): Secondary | ICD-10-CM | POA: Diagnosis present

## 2016-08-22 DIAGNOSIS — N179 Acute kidney failure, unspecified: Secondary | ICD-10-CM | POA: Diagnosis not present

## 2016-08-22 DIAGNOSIS — E785 Hyperlipidemia, unspecified: Secondary | ICD-10-CM | POA: Diagnosis present

## 2016-08-22 DIAGNOSIS — Z79899 Other long term (current) drug therapy: Secondary | ICD-10-CM

## 2016-08-22 DIAGNOSIS — S91301A Unspecified open wound, right foot, initial encounter: Secondary | ICD-10-CM | POA: Diagnosis not present

## 2016-08-22 DIAGNOSIS — E1140 Type 2 diabetes mellitus with diabetic neuropathy, unspecified: Secondary | ICD-10-CM | POA: Diagnosis not present

## 2016-08-22 DIAGNOSIS — E11621 Type 2 diabetes mellitus with foot ulcer: Secondary | ICD-10-CM | POA: Diagnosis not present

## 2016-08-22 DIAGNOSIS — I509 Heart failure, unspecified: Secondary | ICD-10-CM | POA: Diagnosis not present

## 2016-08-22 DIAGNOSIS — I130 Hypertensive heart and chronic kidney disease with heart failure and stage 1 through stage 4 chronic kidney disease, or unspecified chronic kidney disease: Secondary | ICD-10-CM | POA: Diagnosis not present

## 2016-08-22 DIAGNOSIS — G894 Chronic pain syndrome: Secondary | ICD-10-CM | POA: Diagnosis not present

## 2016-08-22 DIAGNOSIS — M109 Gout, unspecified: Secondary | ICD-10-CM | POA: Diagnosis not present

## 2016-08-22 DIAGNOSIS — Z825 Family history of asthma and other chronic lower respiratory diseases: Secondary | ICD-10-CM | POA: Diagnosis not present

## 2016-08-22 DIAGNOSIS — A419 Sepsis, unspecified organism: Secondary | ICD-10-CM | POA: Diagnosis not present

## 2016-08-22 DIAGNOSIS — Z8739 Personal history of other diseases of the musculoskeletal system and connective tissue: Secondary | ICD-10-CM

## 2016-08-22 DIAGNOSIS — L03115 Cellulitis of right lower limb: Secondary | ICD-10-CM

## 2016-08-22 DIAGNOSIS — E1142 Type 2 diabetes mellitus with diabetic polyneuropathy: Secondary | ICD-10-CM | POA: Diagnosis not present

## 2016-08-22 DIAGNOSIS — L02619 Cutaneous abscess of unspecified foot: Secondary | ICD-10-CM

## 2016-08-22 DIAGNOSIS — L03119 Cellulitis of unspecified part of limb: Secondary | ICD-10-CM

## 2016-08-22 DIAGNOSIS — Z8249 Family history of ischemic heart disease and other diseases of the circulatory system: Secondary | ICD-10-CM | POA: Diagnosis not present

## 2016-08-22 DIAGNOSIS — L97519 Non-pressure chronic ulcer of other part of right foot with unspecified severity: Secondary | ICD-10-CM | POA: Diagnosis not present

## 2016-08-22 DIAGNOSIS — E669 Obesity, unspecified: Secondary | ICD-10-CM | POA: Diagnosis present

## 2016-08-22 DIAGNOSIS — M868X7 Other osteomyelitis, ankle and foot: Secondary | ICD-10-CM | POA: Diagnosis not present

## 2016-08-22 DIAGNOSIS — IMO0002 Reserved for concepts with insufficient information to code with codable children: Secondary | ICD-10-CM

## 2016-08-22 DIAGNOSIS — Z5181 Encounter for therapeutic drug level monitoring: Secondary | ICD-10-CM | POA: Diagnosis not present

## 2016-08-22 DIAGNOSIS — Z89421 Acquired absence of other right toe(s): Secondary | ICD-10-CM | POA: Diagnosis not present

## 2016-08-22 DIAGNOSIS — E1165 Type 2 diabetes mellitus with hyperglycemia: Secondary | ICD-10-CM | POA: Diagnosis not present

## 2016-08-22 DIAGNOSIS — F0390 Unspecified dementia without behavioral disturbance: Secondary | ICD-10-CM | POA: Diagnosis not present

## 2016-08-22 DIAGNOSIS — L02611 Cutaneous abscess of right foot: Secondary | ICD-10-CM | POA: Diagnosis not present

## 2016-08-22 DIAGNOSIS — D638 Anemia in other chronic diseases classified elsewhere: Secondary | ICD-10-CM | POA: Diagnosis present

## 2016-08-22 DIAGNOSIS — M869 Osteomyelitis, unspecified: Secondary | ICD-10-CM

## 2016-08-22 DIAGNOSIS — D729 Disorder of white blood cells, unspecified: Secondary | ICD-10-CM | POA: Diagnosis not present

## 2016-08-22 DIAGNOSIS — R6510 Systemic inflammatory response syndrome (SIRS) of non-infectious origin without acute organ dysfunction: Secondary | ICD-10-CM | POA: Diagnosis not present

## 2016-08-22 DIAGNOSIS — L089 Local infection of the skin and subcutaneous tissue, unspecified: Secondary | ICD-10-CM | POA: Diagnosis present

## 2016-08-22 DIAGNOSIS — M86671 Other chronic osteomyelitis, right ankle and foot: Secondary | ICD-10-CM | POA: Diagnosis not present

## 2016-08-22 DIAGNOSIS — E1122 Type 2 diabetes mellitus with diabetic chronic kidney disease: Secondary | ICD-10-CM | POA: Diagnosis present

## 2016-08-22 DIAGNOSIS — R509 Fever, unspecified: Secondary | ICD-10-CM

## 2016-08-22 DIAGNOSIS — M86171 Other acute osteomyelitis, right ankle and foot: Secondary | ICD-10-CM | POA: Diagnosis not present

## 2016-08-22 DIAGNOSIS — G8929 Other chronic pain: Secondary | ICD-10-CM | POA: Diagnosis not present

## 2016-08-22 DIAGNOSIS — Z818 Family history of other mental and behavioral disorders: Secondary | ICD-10-CM | POA: Diagnosis not present

## 2016-08-22 DIAGNOSIS — D72829 Elevated white blood cell count, unspecified: Secondary | ICD-10-CM

## 2016-08-22 DIAGNOSIS — Z96641 Presence of right artificial hip joint: Secondary | ICD-10-CM | POA: Diagnosis present

## 2016-08-22 DIAGNOSIS — Z794 Long term (current) use of insulin: Secondary | ICD-10-CM

## 2016-08-22 DIAGNOSIS — Z87442 Personal history of urinary calculi: Secondary | ICD-10-CM | POA: Diagnosis not present

## 2016-08-22 DIAGNOSIS — E1169 Type 2 diabetes mellitus with other specified complication: Secondary | ICD-10-CM | POA: Diagnosis not present

## 2016-08-22 DIAGNOSIS — M009 Pyogenic arthritis, unspecified: Secondary | ICD-10-CM | POA: Diagnosis not present

## 2016-08-22 DIAGNOSIS — R319 Hematuria, unspecified: Secondary | ICD-10-CM | POA: Diagnosis present

## 2016-08-22 DIAGNOSIS — T8189XA Other complications of procedures, not elsewhere classified, initial encounter: Secondary | ICD-10-CM | POA: Diagnosis not present

## 2016-08-22 DIAGNOSIS — I129 Hypertensive chronic kidney disease with stage 1 through stage 4 chronic kidney disease, or unspecified chronic kidney disease: Secondary | ICD-10-CM | POA: Diagnosis not present

## 2016-08-22 DIAGNOSIS — F329 Major depressive disorder, single episode, unspecified: Secondary | ICD-10-CM | POA: Diagnosis present

## 2016-08-22 DIAGNOSIS — Z4781 Encounter for orthopedic aftercare following surgical amputation: Secondary | ICD-10-CM | POA: Diagnosis not present

## 2016-08-22 DIAGNOSIS — D649 Anemia, unspecified: Secondary | ICD-10-CM | POA: Diagnosis not present

## 2016-08-22 DIAGNOSIS — Z6835 Body mass index (BMI) 35.0-35.9, adult: Secondary | ICD-10-CM

## 2016-08-22 DIAGNOSIS — I10 Essential (primary) hypertension: Secondary | ICD-10-CM | POA: Diagnosis present

## 2016-08-22 DIAGNOSIS — Z452 Encounter for adjustment and management of vascular access device: Secondary | ICD-10-CM | POA: Diagnosis not present

## 2016-08-22 DIAGNOSIS — E11628 Type 2 diabetes mellitus with other skin complications: Secondary | ICD-10-CM | POA: Diagnosis not present

## 2016-08-22 DIAGNOSIS — F1120 Opioid dependence, uncomplicated: Secondary | ICD-10-CM | POA: Diagnosis not present

## 2016-08-22 DIAGNOSIS — N189 Chronic kidney disease, unspecified: Secondary | ICD-10-CM | POA: Diagnosis not present

## 2016-08-22 DIAGNOSIS — Z792 Long term (current) use of antibiotics: Secondary | ICD-10-CM | POA: Diagnosis not present

## 2016-08-22 HISTORY — DX: Gout, unspecified: M10.9

## 2016-08-22 LAB — RAPID URINE DRUG SCREEN, HOSP PERFORMED
Amphetamines: NOT DETECTED
BARBITURATES: NOT DETECTED
Benzodiazepines: NOT DETECTED
COCAINE: NOT DETECTED
Opiates: POSITIVE — AB
TETRAHYDROCANNABINOL: NOT DETECTED

## 2016-08-22 LAB — CBC WITH DIFFERENTIAL/PLATELET
BASOS ABS: 0 10*3/uL (ref 0.0–0.1)
Basophils Relative: 0 %
EOS ABS: 0.1 10*3/uL (ref 0.0–0.7)
EOS PCT: 0 %
HCT: 30.5 % — ABNORMAL LOW (ref 39.0–52.0)
HEMOGLOBIN: 9.9 g/dL — AB (ref 13.0–17.0)
LYMPHS ABS: 2.9 10*3/uL (ref 0.7–4.0)
LYMPHS PCT: 16 %
MCH: 28.4 pg (ref 26.0–34.0)
MCHC: 32.5 g/dL (ref 30.0–36.0)
MCV: 87.4 fL (ref 78.0–100.0)
Monocytes Absolute: 1.2 10*3/uL — ABNORMAL HIGH (ref 0.1–1.0)
Monocytes Relative: 6 %
NEUTROS PCT: 78 %
Neutro Abs: 14.3 10*3/uL — ABNORMAL HIGH (ref 1.7–7.7)
PLATELETS: 333 10*3/uL (ref 150–400)
RBC: 3.49 MIL/uL — AB (ref 4.22–5.81)
RDW: 13 % (ref 11.5–15.5)
WBC: 18.4 10*3/uL — AB (ref 4.0–10.5)

## 2016-08-22 LAB — URINALYSIS, ROUTINE W REFLEX MICROSCOPIC
Bilirubin Urine: NEGATIVE
GLUCOSE, UA: 150 mg/dL — AB
KETONES UR: NEGATIVE mg/dL
LEUKOCYTES UA: NEGATIVE
Nitrite: NEGATIVE
PH: 5 (ref 5.0–8.0)
Protein, ur: NEGATIVE mg/dL
SPECIFIC GRAVITY, URINE: 1.016 (ref 1.005–1.030)

## 2016-08-22 LAB — POCT CBC
GRANULOCYTE PERCENT: 77.5 % (ref 37–80)
HCT, POC: 31.9 % — AB (ref 43.5–53.7)
HEMOGLOBIN: 10.6 g/dL — AB (ref 14.1–18.1)
Lymph, poc: 2.1 (ref 0.6–3.4)
MCH, POC: 28.5 pg (ref 27–31.2)
MCHC: 33.4 g/dL (ref 31.8–35.4)
MCV: 85.4 fL (ref 80–97)
MID (cbc): 1.8 — AB (ref 0–0.9)
MPV: 8.7 fL (ref 0–99.8)
PLATELET COUNT, POC: 351 10*3/uL (ref 142–424)
POC Granulocyte: 13.6 — AB (ref 2–6.9)
POC LYMPH PERCENT: 12.1 %L (ref 10–50)
POC MID %: 10.4 %M (ref 0–12)
RBC: 3.74 M/uL — AB (ref 4.69–6.13)
RDW, POC: 13.3 %
WBC: 17.6 10*3/uL — AB (ref 4.6–10.2)

## 2016-08-22 LAB — HEPATIC FUNCTION PANEL
ALBUMIN: 3.3 g/dL — AB (ref 3.5–5.0)
ALT: 12 U/L — ABNORMAL LOW (ref 17–63)
AST: 14 U/L — AB (ref 15–41)
Alkaline Phosphatase: 87 U/L (ref 38–126)
BILIRUBIN DIRECT: 0.1 mg/dL (ref 0.1–0.5)
Indirect Bilirubin: 0.7 mg/dL (ref 0.3–0.9)
TOTAL PROTEIN: 8.6 g/dL — AB (ref 6.5–8.1)
Total Bilirubin: 0.8 mg/dL (ref 0.3–1.2)

## 2016-08-22 LAB — BASIC METABOLIC PANEL
ANION GAP: 8 (ref 5–15)
BUN: 44 mg/dL — ABNORMAL HIGH (ref 6–20)
CHLORIDE: 101 mmol/L (ref 101–111)
CO2: 25 mmol/L (ref 22–32)
Calcium: 8.7 mg/dL — ABNORMAL LOW (ref 8.9–10.3)
Creatinine, Ser: 1.64 mg/dL — ABNORMAL HIGH (ref 0.61–1.24)
GFR calc Af Amer: 54 mL/min — ABNORMAL LOW (ref >60)
GFR, EST NON AFRICAN AMERICAN: 47 mL/min — AB (ref >60)
Glucose, Bld: 255 mg/dL — ABNORMAL HIGH (ref 65–99)
POTASSIUM: 4.2 mmol/L (ref 3.5–5.1)
SODIUM: 134 mmol/L — AB (ref 135–145)

## 2016-08-22 LAB — GLUCOSE, CAPILLARY: GLUCOSE-CAPILLARY: 223 mg/dL — AB (ref 65–99)

## 2016-08-22 LAB — GLUCOSE, POCT (MANUAL RESULT ENTRY): POC Glucose: 189 mg/dl — AB (ref 70–99)

## 2016-08-22 LAB — SEDIMENTATION RATE: SED RATE: 132 mm/h — AB (ref 0–16)

## 2016-08-22 LAB — PROTIME-INR
INR: 0.99
Prothrombin Time: 13.1 seconds (ref 11.4–15.2)

## 2016-08-22 MED ORDER — SODIUM CHLORIDE 0.9 % IV BOLUS (SEPSIS)
500.0000 mL | Freq: Once | INTRAVENOUS | Status: AC
  Administered 2016-08-22: 500 mL via INTRAVENOUS

## 2016-08-22 MED ORDER — ACETAMINOPHEN 325 MG PO TABS
650.0000 mg | ORAL_TABLET | Freq: Four times a day (QID) | ORAL | Status: DC | PRN
  Administered 2016-08-23: 650 mg via ORAL
  Filled 2016-08-22: qty 2

## 2016-08-22 MED ORDER — INSULIN ASPART 100 UNIT/ML ~~LOC~~ SOLN
0.0000 [IU] | Freq: Every day | SUBCUTANEOUS | Status: DC
  Administered 2016-08-22 – 2016-08-26 (×2): 2 [IU] via SUBCUTANEOUS

## 2016-08-22 MED ORDER — ACETAMINOPHEN 650 MG RE SUPP: 650.0000 mg | Freq: Four times a day (QID) | RECTAL | Status: DC | PRN

## 2016-08-22 MED ORDER — ENOXAPARIN SODIUM 60 MG/0.6ML ~~LOC~~ SOLN
60.0000 mg | SUBCUTANEOUS | Status: DC
  Administered 2016-08-22 – 2016-08-25 (×4): 60 mg via SUBCUTANEOUS
  Filled 2016-08-22 (×4): qty 0.6

## 2016-08-22 MED ORDER — VANCOMYCIN HCL 10 G IV SOLR
1250.0000 mg | INTRAVENOUS | Status: DC
  Administered 2016-08-23 – 2016-08-26 (×5): 1250 mg via INTRAVENOUS
  Filled 2016-08-22 (×6): qty 1250

## 2016-08-22 MED ORDER — ACETAMINOPHEN 500 MG PO TABS
1000.0000 mg | ORAL_TABLET | Freq: Once | ORAL | Status: AC
  Administered 2016-08-22: 1000 mg via ORAL

## 2016-08-22 MED ORDER — DEXTROSE 5 % IV SOLN
1.0000 g | Freq: Three times a day (TID) | INTRAVENOUS | Status: DC
  Administered 2016-08-22 – 2016-08-29 (×20): 1 g via INTRAVENOUS
  Filled 2016-08-22 (×23): qty 1

## 2016-08-22 MED ORDER — METRONIDAZOLE 500 MG PO TABS
500.0000 mg | ORAL_TABLET | Freq: Three times a day (TID) | ORAL | Status: DC
  Administered 2016-08-22 – 2016-08-29 (×20): 500 mg via ORAL
  Filled 2016-08-22 (×20): qty 1

## 2016-08-22 MED ORDER — GABAPENTIN 400 MG PO CAPS
1200.0000 mg | ORAL_CAPSULE | Freq: Three times a day (TID) | ORAL | Status: DC
  Administered 2016-08-22 – 2016-08-31 (×24): 1200 mg via ORAL
  Filled 2016-08-22 (×24): qty 3

## 2016-08-22 MED ORDER — HYDROCODONE-ACETAMINOPHEN 10-325 MG PO TABS
1.0000 | ORAL_TABLET | ORAL | Status: DC | PRN
  Administered 2016-08-22 – 2016-08-27 (×19): 1 via ORAL
  Filled 2016-08-22 (×19): qty 1

## 2016-08-22 MED ORDER — AMITRIPTYLINE HCL 50 MG PO TABS
100.0000 mg | ORAL_TABLET | Freq: Every day | ORAL | Status: DC
  Administered 2016-08-22: 100 mg via ORAL
  Filled 2016-08-22 (×2): qty 2
  Filled 2016-08-22 (×3): qty 4
  Filled 2016-08-22: qty 2
  Filled 2016-08-22: qty 4

## 2016-08-22 MED ORDER — INSULIN ASPART 100 UNIT/ML ~~LOC~~ SOLN
0.0000 [IU] | Freq: Three times a day (TID) | SUBCUTANEOUS | Status: DC
  Administered 2016-08-23: 100 [IU] via SUBCUTANEOUS
  Administered 2016-08-23: 3 [IU] via SUBCUTANEOUS
  Administered 2016-08-23: 7 [IU] via SUBCUTANEOUS
  Administered 2016-08-24: 3 [IU] via SUBCUTANEOUS
  Administered 2016-08-24: 4 [IU] via SUBCUTANEOUS
  Administered 2016-08-24: 7 [IU] via SUBCUTANEOUS
  Administered 2016-08-25: 3 [IU] via SUBCUTANEOUS
  Administered 2016-08-25 (×2): 7 [IU] via SUBCUTANEOUS
  Administered 2016-08-26: 4 [IU] via SUBCUTANEOUS
  Administered 2016-08-26: 11 [IU] via SUBCUTANEOUS
  Administered 2016-08-26: 4 [IU] via SUBCUTANEOUS

## 2016-08-22 MED ORDER — CHLORHEXIDINE GLUCONATE 0.12 % MT SOLN
15.0000 mL | Freq: Two times a day (BID) | OROMUCOSAL | Status: DC
  Administered 2016-08-23 – 2016-08-28 (×9): 15 mL via OROMUCOSAL
  Filled 2016-08-22 (×10): qty 15

## 2016-08-22 MED ORDER — ORAL CARE MOUTH RINSE
15.0000 mL | Freq: Two times a day (BID) | OROMUCOSAL | Status: DC
  Administered 2016-08-23 – 2016-08-28 (×8): 15 mL via OROMUCOSAL

## 2016-08-22 MED ORDER — VANCOMYCIN HCL IN DEXTROSE 1-5 GM/200ML-% IV SOLN
1000.0000 mg | INTRAVENOUS | Status: AC
  Administered 2016-08-22: 1000 mg via INTRAVENOUS
  Filled 2016-08-22: qty 200

## 2016-08-22 MED ORDER — SODIUM CHLORIDE 0.9 % IV SOLN
INTRAVENOUS | Status: DC
  Administered 2016-08-22 – 2016-08-25 (×5): via INTRAVENOUS
  Administered 2016-08-26: 1000 mL via INTRAVENOUS
  Administered 2016-08-27: 01:00:00 via INTRAVENOUS

## 2016-08-22 MED ORDER — ONDANSETRON HCL 4 MG PO TABS: 4.0000 mg | ORAL_TABLET | Freq: Four times a day (QID) | ORAL | Status: DC | PRN

## 2016-08-22 MED ORDER — ONDANSETRON HCL 4 MG/2ML IJ SOLN: 4.0000 mg | Freq: Four times a day (QID) | INTRAMUSCULAR | Status: DC | PRN

## 2016-08-22 MED ORDER — ALLOPURINOL 300 MG PO TABS
300.0000 mg | ORAL_TABLET | Freq: Every day | ORAL | Status: DC
  Administered 2016-08-23 – 2016-08-31 (×5): 300 mg via ORAL
  Filled 2016-08-22 (×9): qty 1

## 2016-08-22 MED ORDER — INSULIN GLARGINE 100 UNIT/ML ~~LOC~~ SOLN
30.0000 [IU] | Freq: Two times a day (BID) | SUBCUTANEOUS | Status: DC
  Administered 2016-08-22 – 2016-08-23 (×2): 30 [IU] via SUBCUTANEOUS
  Filled 2016-08-22 (×2): qty 0.3

## 2016-08-22 MED ORDER — ROSUVASTATIN CALCIUM 10 MG PO TABS
20.0000 mg | ORAL_TABLET | Freq: Every day | ORAL | Status: DC
  Administered 2016-08-22 – 2016-08-31 (×9): 20 mg via ORAL
  Filled 2016-08-22 (×2): qty 2
  Filled 2016-08-22: qty 1
  Filled 2016-08-22: qty 2
  Filled 2016-08-22 (×4): qty 1
  Filled 2016-08-22: qty 2

## 2016-08-22 NOTE — Progress Notes (Signed)
MRN: [id number] DOB: February 16, 1966  Subjective:   [name] Snow is a 51 y.o. male with pmh of uncontrolled diabetes presenting for chief complaint of Foot Pain (R foot infection )  Reports 2 day history of right foot swelling, redness. Patient has a history of uncontrolled diabetes and associated cellulitis and skin infections. He was last managed for the same problem in late February to beginning of March. He reports that his cellulitis resolved then with antibiotics and wound care at our clinic. Denies fever, pain, trauma. Admits that he does not have feeling in his foot any more.   Linken has a current medication list which includes the following prescription(s): allopurinol, amitriptyline, one touch ultra system kit, clotrimazole-betamethasone, dulaglutide, gabapentin, glucose blood, hydrocodone-acetaminophen, hydrocodone-acetaminophen, hydrocodone-acetaminophen, insulin glargine, insulin syringe 1cc/31gx5/16", lisinopril, metformin, onetouch delica lancets 16S, rosuvastatin, and amoxicillin-clavulanate. Also has No Known Allergies.  [name]  has a past medical history of Arthritis; Cataract; CHF (congestive heart failure) ([place]); Chronic kidney disease; Clotting disorder ([place]); Diabetes mellitus; and Hypertension. Also  has a past surgical history that includes Joint replacement (063016).  Objective:   Vitals: BP (!) 145/78   Pulse 86   Temp 100.2 F (37.9 C) (Oral)   Resp 18   Ht _0  (1.778 m)   Wt 262 lb (118.8 kg)   SpO2 98%   BMI 37.59 kg/m   Physical Exam  Constitutional: He is oriented to person, place, and time. He appears well-developed and well-nourished.  Cardiovascular: Normal rate.   Pulmonary/Chest: Effort normal.  Musculoskeletal:       Right foot: There is swelling (up to ankle with associated erythema).       Feet:  Neurological: He is alert and oriented to person, place, and time.   Results for orders placed or performed in visit on 08/22/16 (from the past  24 hour(s))  POCT glucose (manual entry)     Status: Abnormal   Collection Time: 08/22/16  3:23 PM  Result Value Ref Range   POC Glucose 189 (A) 70 - 99 mg/dl  POCT CBC     Status: Abnormal   Collection Time: 08/22/16  3:23 PM  Result Value Ref Range   WBC 17.6 (A) 4.6 - 10.2 K/uL   Lymph, poc 2.1 0.6 - 3.4   POC LYMPH PERCENT 12.1 10 - 50 %L   MID (cbc) 1.8 (A) 0 - 0.9   POC MID % 10.4 0 - 12 %M   POC Granulocyte 13.6 (A) 2 - 6.9   Granulocyte percent 77.5 37 - 80 %G   RBC 3.74 (A) 4.69 - 6.13 M/uL   Hemoglobin 10.6 (A) 14.1 - 18.1 g/dL   HCT, POC 31.9 (A) 43.5 - 53.7 %   MCV 85.4 80 - 97 fL   MCH, POC 28.5 27 - 31.2 pg   MCHC 33.4 31.8 - 35.4 g/dL   RDW, POC 13.3 %   Platelet Count, POC 351 142 - 424 K/uL   MPV 8.7 0 - 99.8 fL   Assessment and Plan :   This case was precepted with Dr. Mitchel Honour.   1. Cellulitis and abscess of foot 2. DM type 2, uncontrolled, with neuropathy ([place]) 3. Fever, unspecified fever cause - Will send patient to [facility] immediately. Patient needs further work up for osteomyelitis, IV antibiotics. I discussed this with patient including possibility of amputation. He is aware and will report to ER immediately. Case reported to Ross who verbalized understanding.  Jaynee Eagles, PA-C [facility] [phone] 08/22/2016  2:53 PM

## 2016-08-22 NOTE — ED Triage Notes (Signed)
Pt sent by urgent care for work up for osteomyelitis to right foot and IV antibiotics. Pt has wound to underside of right foot and 5th toe, and fever. Pt has hx of diabetes and cellulitis.

## 2016-08-22 NOTE — Progress Notes (Addendum)
Pharmacy Antibiotic Note  [name] Snow is a 51 y.o. male admitted on 08/22/2016 with cellulitis.  Pharmacy has been consulted for vancomycin dosing. Patient with unctronlled DM presented to UC with R foot cellulitis and possible abscess.   Today, 08/22/2016   SCr elevated at 1.64mg /dl (normalized CrCL = 53 ml/min)  WBC elevated  Plan:  Vancomycin 1gm x 1 then  IV q24h.  Give 1st maintenance dose in am to complete staggered loading dose  vanco trough goal 10 -15 mcg/mL  Cefepime 1gm IV q8h  Check daily SCr using obesity nomogram  Check trough if remains on vancomycin > 4 days  Weight: 256 lb (116.1 kg)  Temp (24hrs), Avg:99 F (37.2 C), Min:97.7 F (36.5 C), Max:100.2 F (37.9 C)   Recent Labs Lab 08/22/16 1523 08/22/16 1838  WBC 17.6* 18.4*  CREATININE  --  1.64*    Estimated Creatinine Clearance: 67.2 mL/min (A) (by C-G formula based on SCr of 1.64 mg/dL (H)).    No Known Allergies  Antimicrobials this admission: 4/6 >> vancomycin >> 4/6 >> cefepime >> 4/6 << metronidazole >>  Dose adjustments this admission:  Microbiology results: None ordered   Thank you for allowing pharmacy to be a part of this patient's care.  Juliette Alcide, PharmD, BCPS.   Pager: [phone] 08/22/2016 9:35 PM

## 2016-08-22 NOTE — H&P (Addendum)
History and Physical    VAHE PIENTA DEY:[id number] DOB: 10/07/1965 DOA: 08/22/2016  PCP: Elizabeth Sauer   Patient coming from: Urgent care center  Chief Complaint: Diabetic foot ulcer of the right foot  HPI: [name] Snow is a 51 y.o. gentleman with a history of uncontrolled Type 2 diabetes (last known A1c greater than 10 per patient) complicated by neuropathy and CKD 3, HTN, HLD, gout, and chronic pain who has had issues with diabetic foot ulcer involving the right foot since March.  He was seen by an outpatient provider on 3/1 and prescribed Augmentin for cellulitis.  Apparently, there was no abscess then, and according to the patient, he healed with antibiotic treatment and home wound care.  He also had close follow-up with the outpatient provider.  Unfortunately, he admits that he has neglected the foot over the past three weeks due to personal stressors (separated from wife) and increased responsibilities around the home (taking care of his sick mother, taking care of multiple pets).  He denies any new trauma.  He has had low grade fevers but no chills or sweats.  No nausea or vomiting.  The foot is now markedly swollen with progressive erythema and an enlarging area of ulceration that is spontaneously draining.  No significant odor.  He was seen in Urgent Care today and advised to report directly to the ED for evaluation for osteomyelitis.  No diarrhea.  No chest pain or shortness of breath.  History of hematuria (he also reports a history of kidney stones).  ED Course: Temp 100.2.  WBC count 18.4.  BUN 44, Creatinine 1.64 (baseline around 1.2).  The patient received a 500cc NS bolus.  He received vancomycin.  Hospitalist asked to admit.  STAT xrays of his right foot ordered at the time of admission.  Review of Systems: As per HPI otherwise 10 systems reviewed and negative.   Past Medical History:  Diagnosis Date  . Arthritis    gout  . Cataract   . CHF (congestive heart  failure) ([place])   . Chronic kidney disease   . Clotting disorder ([place])   . Diabetes mellitus   . Hypertension     Past Surgical History:  Procedure Laterality Date  . JOINT REPLACEMENT  [id number]   R Hip     reports that he has never smoked. He has never used smokeless tobacco. He reports that he does not drink alcohol or use drugs.  Recently separated from his wife.  He is the primary caregiver for his mother, who has dementia.  No Known Allergies  Family History  Problem Relation Age of Onset  . Mental illness Mother   . Hypertension Mother   . Hyperlipidemia Mother   . Mental illness Father   . COPD Father   . Mental illness Brother      Prior to Admission medications   Medication Sig Start Date End Date Taking? Authorizing Provider  allopurinol (ZYLOPRIM) 300 MG tablet Take 1 tablet (300 mg total) by mouth daily. 06/27/15   Leandrew Koyanagi, MD  amitriptyline (ELAVIL) 100 MG tablet Take 1 tablet (100 mg total) by mouth at bedtime. 07/22/16   Mancel Bale, PA-C  amoxicillin-clavulanate (AUGMENTIN) 875-125 MG tablet Take 1 tablet by mouth 2 (two) times daily. Patient not taking: Reported on 08/22/2016 07/17/16   Mancel Bale, PA-C  Blood Glucose Monitoring Suppl (ONE TOUCH ULTRA SYSTEM KIT) w/Device KIT Use as directed.  DX Code: E11.40 06/27/15   Leandrew Koyanagi,  MD  clotrimazole-betamethasone (LOTRISONE) cream Apply 1 application topically 2 (two) times daily. 04/25/16   Mancel Bale, PA-C  Dulaglutide (TRULICITY) 1.5 VQ/0.0QQ SOPN Inject 1.5 mg into the skin once a week. 07/24/16   Mancel Bale, PA-C  gabapentin (NEURONTIN) 600 MG tablet Take 2 tablets (1,200 mg total) by mouth 3 (three) times daily. 07/22/16   Mancel Bale, PA-C  glucose blood test strip Use as directed twice a day. DX Code: E11.40 07/22/16   Mancel Bale, PA-C  HYDROcodone-acetaminophen (NORCO) 10-325 MG tablet Take 1 tablet by mouth every 4 (four) hours as needed. 07/22/16   Mancel Bale, PA-C    HYDROcodone-acetaminophen (NORCO) 10-325 MG tablet Take 1 tablet by mouth every 4 (four) hours as needed. 07/22/16   Mancel Bale, PA-C  HYDROcodone-acetaminophen (NORCO) 10-325 MG tablet Take 1 tablet by mouth every 4 (four) hours as needed. 07/22/16   Mancel Bale, PA-C  Insulin Glargine (LANTUS SOLOSTAR) 100 UNIT/ML Solostar Pen Inject 30 Units into the skin 2 (two) times daily. 07/22/16   Mancel Bale, PA-C  Insulin Syringe-Needle U-100 (INSULIN SYRINGE 1CC/31GX5/16") 31G X 5/16" 1 ML MISC Use as directed once a day. Dx Code: 250.00 01/30/14   Leandrew Koyanagi, MD  lisinopril (PRINIVIL,ZESTRIL) 10 MG tablet Take 0.5 tablets (5 mg total) by mouth daily. 04/25/16   Mancel Bale, PA-C  metFORMIN (GLUCOPHAGE-XR) 500 MG 24 hr tablet Take 2 tablets (1,000 mg total) by mouth 2 (two) times daily. 07/22/16   Mancel Bale, PA-C  [facility] DELICA LANCETS 76P MISC Use as directed twice a day. DX Code: E11.40 06/27/15   Leandrew Koyanagi, MD  rosuvastatin (CRESTOR) 20 MG tablet Take 1 tablet (20 mg total) by mouth daily. 07/22/16   Mancel Bale, PA-C    Physical Exam: Vitals:   08/22/16 1639 08/22/16 2038  BP: 134/83 (!) 142/81  Pulse: 84 72  Resp: 20 18  Temp: 97.7 F (36.5 C) 99 F (37.2 C)  TempSrc: Oral Oral  SpO2: 98% 97%  Weight: 116.1 kg (256 lb)       Constitutional: NAD, calm, comfortable, NONtoxic appearing Vitals:   08/22/16 1639 08/22/16 2038  BP: 134/83 (!) 142/81  Pulse: 84 72  Resp: 20 18  Temp: 97.7 F (36.5 C) 99 F (37.2 C)  TempSrc: Oral Oral  SpO2: 98% 97%  Weight: 116.1 kg (256 lb)    Eyes: PERRL, lids and conjunctivae normal ENMT: Mucous membranes are moist. Posterior pharynx clear of any exudate or lesions. Normal dentition.  Neck: normal appearance, supple, no masses Respiratory: clear to auscultation bilaterally, no wheezing, no crackles. Normal respiratory effort. No accessory muscle use.  Cardiovascular: Normal rate, regular rhythm, no murmurs / rubs /  gallops. No extremity edema. 2+ pedal pulses. GI: abdomen is protuberant but compressible.  No tenderness.  Bowel sounds are present. Musculoskeletal:  No joint deformity in upper and lower extremities. Good ROM, no contractures. Normal muscle tone.  Skin: warm and dry; blanching erythema and warmth in his right foot and ankle.  He has a 3-4cm area of ulceration to the lateral aspect of his right foot at the base of his toes, primarily involving the 5th digit.  The toe is discolored.  There is a central area of necrosis in the ulcer.  No active drainage.  I did not feel crepitus.  No strong odor.  Of note, he also has an eschar at the base of his great toe on  the dorsal aspect of the right foot as well. Neurologic: CN 2-12 grossly intact. Decreased sensation in bilateral lower extremities.  Strength symmetric bilaterally. Psychiatric: Normal judgment and insight. Alert and oriented x 3. Normal mood though tangential thoughts and emotional distress regarding family issues.    Labs on Admission: I have personally reviewed following labs and imaging studies  CBC:  Recent Labs Lab 08/22/16 1523 08/22/16 1838  WBC 17.6* 18.4*  NEUTROABS  --  14.3*  HGB 10.6* 9.9*  HCT 31.9* 30.5*  MCV 85.4 87.4  PLT  --  224   Basic Metabolic Panel:  Recent Labs Lab 08/22/16 1838  NA 134*  K 4.2  CL 101  CO2 25  GLUCOSE 255*  BUN 44*  CREATININE 1.64*  CALCIUM 8.7*   GFR: Estimated Creatinine Clearance: 67.2 mL/min (A) (by C-G formula based on SCr of 1.64 mg/dL (H)).  Urine analysis: Pending  Radiological Exams on Admission: Dg Foot Complete Right  Result Date: 08/22/2016 CLINICAL DATA:  Osteomyelitis of the right foot, wound to the underside of the right foot and fifth toe EXAM: RIGHT FOOT COMPLETE - 3+ VIEW COMPARISON:  07/17/2016 FINDINGS: Thin metallic opacity overlying the distal soft tissues of the first digit is unchanged. Small lucent lesion at the base of the first distal phalanx  also unchanged. Soft tissue gas at the fifth digit. Suspect cortical erosive changes along the medial base of the fifth proximal phalanx. No joint space narrowing. No fracture. IMPRESSION: 1. Diffuse soft tissue gas within the fifth digit. 2. Suspect cortical erosive changes at the base of the fifth proximal phalanx as may be seen with osteomyelitis. 3. Stable linear possible foreign body distal great toe Electronically Signed   By: Donavan Foil M.D.   On: 08/22/2016 20:42    EKG: Pre-op study requested at time of admission.   Assessment/Plan Principal Problem:   Diabetic foot infection ([place]) Active Problems:   DM type 2, uncontrolled, with neuropathy ([place])   Gout   HTN (hypertension)   AKI (acute kidney injury) ([place])   SIRS (systemic inflammatory response syndrome) ([facility])   Leukocytosis      Diabetic foot ulcer with signs concerning for osteomyelitis on xray.  The patient has SIRS but he does not appear overtly septic at this time.   --IV Cefepime, vanco, plus oral flagyl per diabetic foot order set --Check sed rate and CRP --Blood cultures --Routine ortho consult accepted by Dr. Lorin Mercy (on call for [facility] unassigned). --MRI of the right foot pending --Will check coags; anticipating patient will need surgical intervention. --Will check pre-op EKG --Hydrate with NS at 100cc/hr --NPO after breakfast tomorrow  AKI on CKD 3 --Expect improvement with hydration --HOLD lisinopril, metformin  HLD --Statin  DM --Continue home dose of lantus --SSI coverage AC/HS  History of gout --Allopurinol  Self-reported hematuria --Check U/A  Diabetic neuropathy --Neurontin, Elavil  Chronic pain, narcotic dependence --Hydrocodone q4h  Recent stressors --Case manager --Chaplain   DVT prophylaxis: Lovenox Code Status: FULL Family Communication: Patient alone in the ED at time of admission. Disposition Plan: Expect he will go home at discharge. Consults called: NONE Admission status:  Inpatient, med surg.  Patient at risk for osteomyelitis; may need surgical intervention for diabetic foot ulcer.  I expect he will need inpatient services for greater than two midnights.   TIME SPENT: 60 minutes   Eber Jones MD [facility] Pager [phone]  If 7PM-7AM, please contact night-coverage [email or web address] Password [facility]  08/22/2016, 9:31 PM  Addendum 08/23/2016 1912\  Chart reviewed. "Consults called" should say Ortho (Dr. Lorin Mercy), which is noted previously under my assessment and plan.  Case was discussed with him on the date of presentation 08/22/2016.  MRI of the foot was ordered per our discussion, as well as clear liquid diet for Saturday morning then NPO status in the event that he ended up in the OR for Saturday afternoon/evening.  [name] Kocher, MD

## 2016-08-22 NOTE — Progress Notes (Addendum)
Pharmacy Antibiotic Note  [name] Snow is a 51 y.o. male admitted on 08/22/2016 with cellulitis.  Pharmacy has been consulted for vancomycin dosing. Patient with unctronlled DM presented to UC with R foot cellulitis and possible abscess.   Today, 08/22/2016   SCr elevated at 1.64mg /dl (normalized CrCL = 53 ml/min)  WBC elevated  Plan:  Vancomycin 1gm x 1 then  IV q24h.  Give 1st maintenance dose in am to complete staggered loading dose  vanco trough goal 10 -15 mcg/mL  Check daily SCr using obesity nomogram  Check trough if remains on vancomycin > 4 days  Weight: 256 lb (116.1 kg)  Temp (24hrs), Avg:99 F (37.2 C), Min:97.7 F (36.5 C), Max:100.2 F (37.9 C)   Recent Labs Lab 08/22/16 1523  WBC 17.6*    CrCl cannot be calculated (Patient's most recent lab result is older than the maximum 21 days allowed.).    No Known Allergies  Antimicrobials this admission: 4/6 >> vancomycin >>  Dose adjustments this admission:  Microbiology results: None ordered   Thank you for allowing pharmacy to be a part of this patient's care.  Juliette Alcide, PharmD, BCPS.   Pager: [phone] 08/22/2016 5:58 PM

## 2016-08-22 NOTE — ED Provider Notes (Signed)
[facility] DEPT Provider Note   CSN: [id number] Arrival date & time: 08/22/16  1636     History   Chief Complaint Chief Complaint  Patient presents with  . Wound Infection    HPI [name] Snow is a 51 y.o. male.  Patient presents with worsening redness and swelling in the right foot were several days. He was sent here from a local urgent care center. Low-grade fever. No chills. He is diabetic. Redness extends to the distal ankle area. Severity of symptoms is moderate.      Past Medical History:  Diagnosis Date  . Arthritis    gout  . Cataract   . CHF (congestive heart failure) ([place])   . Chronic kidney disease   . Clotting disorder ([employer])   . Diabetes mellitus   . Hypertension     Patient Active Problem List   Diagnosis Date Noted  . HTN (hypertension) 07/22/2016  . Chronic pain syndrome 01/17/2016  . Pain in joint, shoulder region 06/29/2015  . Hyperlipidemia 10/17/2014  . Arteriosclerosis 06/17/2014  . Lung nodule-CT 3/15 06/17/2014  . Hepatomegaly-steatosis 08/08/2013  . DM neuropathy, painful ([place]) 10/19/2011  . Hip pain, chronic 10/19/2011  . Onychomycosis 10/19/2011  . Nephrolithiasis 10/19/2011  . Carpal tunnel syndrome 10/19/2011  . Obesity, Class III, BMI 40-49.9 (morbid obesity) ([place]) 04/30/2011  . DM type 2, uncontrolled, with neuropathy ([place]) 04/30/2011  . Gout 04/30/2011  . 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 JOINT REPLACEMENT  020112   R Hip       Home Medications    Prior to Admission medications   Medication Sig Start Date End Date Taking? Authorizing Provider  allopurinol (ZYLOPRIM) 300 MG tablet Take 1 tablet (300 mg total) by mouth daily. 06/27/15   Leandrew Koyanagi, MD  amitriptyline (ELAVIL) 100 MG tablet Take 1 tablet (100 mg total) by mouth at bedtime. 07/22/16   Mancel Bale, PA-C  amoxicillin-clavulanate (AUGMENTIN) 875-125 MG tablet Take 1 tablet by mouth 2 (two)  times daily. Patient not taking: Reported on 08/22/2016 07/17/16   Mancel Bale, PA-C  Blood Glucose Monitoring Suppl (ONE TOUCH ULTRA SYSTEM KIT) w/Device KIT Use as directed.  DX Code: E11.40 06/27/15   Leandrew Koyanagi, MD  clotrimazole-betamethasone (LOTRISONE) cream Apply 1 application topically 2 (two) times daily. 04/25/16   Mancel Bale, PA-C  Dulaglutide (TRULICITY) 1.5 RC/7.8LF SOPN Inject 1.5 mg into the skin once a week. 07/24/16   Mancel Bale, PA-C  gabapentin (NEURONTIN) 600 MG tablet Take 2 tablets (1,200 mg total) by mouth 3 (three) times daily. 07/22/16   Mancel Bale, PA-C  glucose blood test strip Use as directed twice a day. DX Code: E11.40 07/22/16   Mancel Bale, PA-C  HYDROcodone-acetaminophen (NORCO) 10-325 MG tablet Take 1 tablet by mouth every 4 (four) hours as needed. 07/22/16   Mancel Bale, PA-C  HYDROcodone-acetaminophen (NORCO) 10-325 MG tablet Take 1 tablet by mouth every 4 (four) hours as needed. 07/22/16   Mancel Bale, PA-C  HYDROcodone-acetaminophen (NORCO) 10-325 MG tablet Take 1 tablet by mouth every 4 (four) hours as needed. 07/22/16   Mancel Bale, PA-C  Insulin Glargine (LANTUS SOLOSTAR) 100 UNIT/ML Solostar Pen Inject 30 Units into the skin 2 (two) times daily. 07/22/16   Mancel Bale, PA-C  Insulin Syringe-Needle U-100 (INSULIN SYRINGE 1CC/31GX5/16") 31G X 5/16" 1 ML MISC Use as directed once a day. Dx Code: 250.00  01/30/14   Leandrew Koyanagi, MD  lisinopril (PRINIVIL,ZESTRIL) 10 MG tablet Take 0.5 tablets (5 mg total) by mouth daily. 04/25/16   Mancel Bale, PA-C  metFORMIN (GLUCOPHAGE-XR) 500 MG 24 hr tablet Take 2 tablets (1,000 mg total) by mouth 2 (two) times daily. 07/22/16   Mancel Bale, PA-C  [facility] DELICA LANCETS 06Y MISC Use as directed twice a day. DX Code: E11.40 06/27/15   Leandrew Koyanagi, MD  rosuvastatin (CRESTOR) 20 MG tablet Take 1 tablet (20 mg total) by mouth daily. 07/22/16   Mancel Bale, PA-C    Family History Family History  Problem  Relation Age of Onset  . Mental illness Mother   . Hypertension Mother   . Hyperlipidemia Mother   . Mental illness Father   . COPD Father   . Mental illness Brother     Social History Social History  Substance Use Topics  . Smoking status: Never Smoker  . Smokeless tobacco: Never Used  . Alcohol use No     Allergies   Patient has no known allergies.   Review of Systems Review of Systems  All other systems reviewed and are negative.    Physical Exam Updated Vital Signs BP 134/83 (BP Location: Right Arm)   Pulse 84   Temp 97.7 F (36.5 C) (Oral)   Resp 20   Wt 256 lb (116.1 kg)   SpO2 98%   BMI 36.73 kg/m   Physical Exam  Constitutional: He is oriented to person, place, and time. He appears well-developed and well-nourished.  HENT:  Head: Normocephalic and atraumatic.  Eyes: Conjunctivae are normal.  Neck: Neck supple.  Cardiovascular: Normal rate and regular rhythm.   Pulmonary/Chest: Effort normal and breath sounds normal.  Abdominal: Soft. Bowel sounds are normal.  Musculoskeletal: Normal range of motion.  Neurological: He is alert and oriented to person, place, and time.  Skin:  Right lower extremity:  Erythema, edema and the distal ankle to the entire foot. The fifth toe is particularly puffy.  Psychiatric: He has a normal mood and affect. His behavior is normal.  Nursing note and vitals reviewed.    ED Treatments / Results  Labs (all labs ordered are listed, but only abnormal results are displayed) Labs Reviewed  CBC WITH DIFFERENTIAL/PLATELET - Abnormal; Notable for the following:       Result Value   WBC 18.4 (*)    RBC 3.49 (*)    Hemoglobin 9.9 (*)    HCT 30.5 (*)    Neutro Abs 14.3 (*)    Monocytes Absolute 1.2 (*)    All other components within normal limits  BASIC METABOLIC PANEL - Abnormal; Notable for the following:    Sodium 134 (*)    Glucose, Bld 255 (*)    BUN 44 (*)    Creatinine, Ser 1.64 (*)    Calcium 8.7 (*)    GFR  calc non Af Amer 47 (*)    GFR calc Af Amer 54 (*)    All other components within normal limits    EKG  EKG Interpretation None       Radiology No results found.  Procedures Procedures (including critical care time)  Medications Ordered in ED Medications  sodium chloride 0.9 % bolus 500 mL (500 mLs Intravenous New Bag/Given 08/22/16 1842)  vancomycin (VANCOCIN) IVPB 1000 mg/200 mL premix (1,000 mg Intravenous New Bag/Given 08/22/16 1842)     Initial Impression / Assessment and Plan / ED Course  I  have reviewed the triage vital signs and the nursing notes.  Pertinent labs & imaging results that were available during my care of the patient were reviewed by me and considered in my medical decision making (see chart for details).    Patient has an obvious cellulitis of his right foot and right ankle.  Rx IV vancomycin. Admit to general medicine.  Final Clinical Impressions(s) / ED Diagnoses   Final diagnoses:  Cellulitis of right foot    New Prescriptions New Prescriptions   No medications on file     Nat Christen, MD 08/22/16 1948

## 2016-08-22 NOTE — Patient Instructions (Addendum)
Please report to [facility] ER immediately. You will need an emergent evaluation including imaging, antibiotics. You will be treated by a different medical team there and they will decide appropriate diagnostic testing but you really need to have osteomyelitis ruled out.   Address: [address] [place], [place], [place] [address]   Cellulitis, Adult Cellulitis is a skin infection. The infected area is usually red and tender. This condition occurs most often in the arms and lower legs. The infection can travel to the muscles, blood, and underlying tissue and become serious. It is very important to get treated for this condition. What are the causes? Cellulitis is caused by bacteria. The bacteria enter through a break in the skin, such as a cut, burn, insect bite, open sore, or crack. What increases the risk? This condition is more likely to occur in people who:  Have a weak defense system (immune system).  Have open wounds on the skin such as cuts, burns, bites, and scrapes. Bacteria can enter the body through these open wounds.  Are older.  Have diabetes.  Have a type of long-lasting (chronic) liver disease (cirrhosis) or kidney disease.  Use IV drugs. What are the signs or symptoms? Symptoms of this condition include:  Redness, streaking, or spotting on the skin.  Swollen area of the skin.  Tenderness or pain when an area of the skin is touched.  Warm skin.  Fever.  Chills.  Blisters. How is this diagnosed? This condition is diagnosed based on a medical history and physical exam. You may also have tests, including:  Blood tests.  Lab tests.  Imaging tests. How is this treated? Treatment for this condition may include:  Medicines, such as antibiotic medicines or antihistamines.  Supportive care, such as rest and application of cold or warm cloths (cold or warm compresses) to the skin.  Hospital care, if the condition is severe. The infection usually gets better within  1-2 days of treatment. Follow these instructions at home:  Take over-the-counter and prescription medicines only as told by your health care provider.  If you were prescribed an antibiotic medicine, take it as told by your health care provider. Do not stop taking the antibiotic even if you start to feel better.  Drink enough fluid to keep your urine clear or pale yellow.  Do not touch or rub the infected area.  Raise (elevate) the infected area above the level of your heart while you are sitting or lying down.  Apply warm or cold compresses to the affected area as told by your health care provider.  Keep all follow-up visits as told by your health care provider. This is important. These visits let your health care provider make sure a more serious infection is not developing. Contact a health care provider if:  You have a fever.  Your symptoms do not improve within 1-2 days of starting treatment.  Your bone or joint underneath the infected area becomes painful after the skin has healed.  Your infection returns in the same area or another area.  You notice a swollen bump in the infected area.  You develop new symptoms.  You have a general ill feeling (malaise) with muscle aches and pains. Get help right away if:  Your symptoms get worse.  You feel very sleepy.  You develop vomiting or diarrhea that persists.  You notice red streaks coming from the infected area.  Your red area gets larger or turns dark in color. This information is not intended to  replace advice given to you by your health care provider. Make sure you discuss any questions you have with your health care provider. Document Released: 02/12/2005 Document Revised: 09/13/2015 Document Reviewed: 03/14/2015 Elsevier Interactive Patient Education  2017 [employer].

## 2016-08-23 ENCOUNTER — Inpatient Hospital Stay (HOSPITAL_COMMUNITY): Payer: PPO

## 2016-08-23 DIAGNOSIS — Z8739 Personal history of other diseases of the musculoskeletal system and connective tissue: Secondary | ICD-10-CM

## 2016-08-23 DIAGNOSIS — L03115 Cellulitis of right lower limb: Secondary | ICD-10-CM

## 2016-08-23 DIAGNOSIS — M109 Gout, unspecified: Secondary | ICD-10-CM

## 2016-08-23 DIAGNOSIS — M86171 Other acute osteomyelitis, right ankle and foot: Secondary | ICD-10-CM

## 2016-08-23 LAB — BASIC METABOLIC PANEL
ANION GAP: 8 (ref 5–15)
BUN: 23 mg/dL — ABNORMAL HIGH (ref 6–20)
CHLORIDE: 104 mmol/L (ref 101–111)
CO2: 25 mmol/L (ref 22–32)
Calcium: 8.7 mg/dL — ABNORMAL LOW (ref 8.9–10.3)
Creatinine, Ser: 1.37 mg/dL — ABNORMAL HIGH (ref 0.61–1.24)
GFR calc non Af Amer: 58 mL/min — ABNORMAL LOW (ref >60)
GLUCOSE: 254 mg/dL — AB (ref 65–99)
Potassium: 4.2 mmol/L (ref 3.5–5.1)
Sodium: 137 mmol/L (ref 135–145)

## 2016-08-23 LAB — C-REACTIVE PROTEIN: CRP: 21 mg/dL — ABNORMAL HIGH (ref <1.0)

## 2016-08-23 LAB — GLUCOSE, CAPILLARY
GLUCOSE-CAPILLARY: 226 mg/dL — AB (ref 65–99)
GLUCOSE-CAPILLARY: 170 mg/dL — AB (ref 65–99)
Glucose-Capillary: 203 mg/dL — ABNORMAL HIGH (ref 65–99)
Glucose-Capillary: 129 mg/dL — ABNORMAL HIGH (ref 65–99)

## 2016-08-23 LAB — SURGICAL PCR SCREEN
MRSA, PCR: NEGATIVE
Staphylococcus aureus: NEGATIVE

## 2016-08-23 LAB — CBC
HEMATOCRIT: 30.1 % — AB (ref 39.0–52.0)
HEMOGLOBIN: 9.6 g/dL — AB (ref 13.0–17.0)
MCH: 28.1 pg (ref 26.0–34.0)
MCHC: 31.9 g/dL (ref 30.0–36.0)
MCV: 88 fL (ref 78.0–100.0)
Platelets: 326 10*3/uL (ref 150–400)
RBC: 3.42 MIL/uL — ABNORMAL LOW (ref 4.22–5.81)
RDW: 13.1 % (ref 11.5–15.5)
WBC: 13.8 10*3/uL — AB (ref 4.0–10.5)

## 2016-08-23 LAB — PREALBUMIN: PREALBUMIN: 10.4 mg/dL — AB (ref 18–38)

## 2016-08-23 LAB — HIV ANTIBODY (ROUTINE TESTING W REFLEX): HIV Screen 4th Generation wRfx: NONREACTIVE

## 2016-08-23 MED ORDER — INSULIN GLARGINE 100 UNIT/ML ~~LOC~~ SOLN
35.0000 [IU] | Freq: Two times a day (BID) | SUBCUTANEOUS | Status: DC
  Administered 2016-08-23 – 2016-08-26 (×7): 35 [IU] via SUBCUTANEOUS
  Filled 2016-08-23 (×8): qty 0.35

## 2016-08-23 MED ORDER — IBUPROFEN 200 MG PO TABS
600.0000 mg | ORAL_TABLET | Freq: Once | ORAL | Status: AC
  Administered 2016-08-24: 600 mg via ORAL
  Filled 2016-08-23: qty 3

## 2016-08-23 MED ORDER — GADOBENATE DIMEGLUMINE 529 MG/ML IV SOLN
20.0000 mL | Freq: Once | INTRAVENOUS | Status: AC | PRN
  Administered 2016-08-23: 20 mL via INTRAVENOUS

## 2016-08-23 NOTE — Progress Notes (Addendum)
[facility] PROGRESS NOTE  [name] Snow NWG:[id number] DOB: Mar 28, 1966 DOA: 08/22/2016  PCP: Elizabeth Sauer  Brief History/Interval Summary: [name] with a past medical history of uncontrolled type 2 diabetes (last known A1c greater than 10 per patient) complicated by neuropathy and CKD 3, HTN, HLD, gout, and chronic pain who has had issues with diabetic foot ulcer involving the right foot since March.  He was seen by an outpatient provider on 3/1 and prescribed Augmentin for cellulitis. Patient admitted that he had been neglecting his foot due to personal stressors. He recently separated from his wife and has been taking care of his sick mother. Presented with worsening swelling of the right foot. Initially went to urgent care center and was advised to come to the emergency department. Evaluation has revealed osteomyelitis and abscess.  Reason for Visit: Right fifth toe osteomyelitis with abscess  Consultants: Orthopedics  Procedures: None. He had  Antibiotics: Vancomycin and cefepime plus oral Flagyl  Subjective/Interval History: Patient continues to have pain in his right foot with occasional neuropathic element. Denies any nausea, vomiting. Denies any history of heart disease.  ROS: Negative chest pain or shortness of breath.  Objective:  Vital Signs  Vitals:   08/22/16 1639 08/22/16 2038 08/22/16 2141 08/23/16 0519  BP: 134/83 (!) 142/81 (!) 167/77 129/65  Pulse: 84 72 77 73  Resp: '20 18 20 20  '$ Temp: 97.7 F (36.5 C) 99 F (37.2 C) 98.7 F (37.1 C) (!) 100.4 F (38 C)  TempSrc: Oral Oral Oral Oral  SpO2: 98% 97% 97% 98%  Weight: 116.1 kg (256 lb)  118.5 kg (261 lb 3.2 oz)   Height:   6' (1.829 m)     Intake/Output Summary (Last 24 hours) at 08/23/16 1117 Last data filed at 08/23/16 5784  Gross per 24 hour  Intake             1452 ml  Output                0 ml  Net             1452 ml   Filed Weights   08/22/16 1639 08/22/16 2141   Weight: 116.1 kg (256 lb) 118.5 kg (261 lb 3.2 oz)    General appearance: alert, cooperative, appears stated age and no distress Resp: clear to auscultation bilaterally Cardio: regular rate and rhythm, S1, S2 normal, no murmur, click, rub or gallop GI: soft, non-tender; bowel sounds normal; no masses,  no organomegaly Extremities: Right foot noted to be swollen and erythematous in the forefoot area. Subtle foul smell is appreciated. Tender to palpation. Warm to touch. However, he does have dorsalis pedis pulses. Pulses: 2+ and symmetric Neurologic: Awake and alert. Oriented 3. No focal neurological deficits are noted.  Lab Results:  Data Reviewed: I have personally reviewed following labs and imaging studies  CBC:  Recent Labs Lab 08/22/16 1523 08/22/16 1838 08/23/16 0413  WBC 17.6* 18.4* 13.8*  NEUTROABS  --  14.3*  --   HGB 10.6* 9.9* 9.6*  HCT 31.9* 30.5* 30.1*  MCV 85.4 87.4 88.0  PLT  --  333 696    Basic Metabolic Panel:  Recent Labs Lab 08/22/16 1838 08/23/16 0413  NA 134* 137  K 4.2 4.2  CL 101 104  CO2 25 25  GLUCOSE 255* 254*  BUN 44* 23*  CREATININE 1.64* 1.37*  CALCIUM 8.7* 8.7*    GFR: Estimated Creatinine Clearance: 83.9 mL/min (A) (by C-G formula  based on SCr of 1.37 mg/dL (H)).  Liver Function Tests:  Recent Labs Lab 08/22/16 2235  AST 14*  ALT 12*  ALKPHOS 87  BILITOT 0.8  PROT 8.6*  ALBUMIN 3.3*    Coagulation Profile:  Recent Labs Lab 08/22/16 2235  INR 0.99    CBG:  Recent Labs Lab 08/22/16 2240 08/23/16 0901  GLUCAP 223* 203*     Radiology Studies: Mr Foot Right W Wo Contrast  Result Date: 08/23/2016 CLINICAL DATA:  Diabetic foot ulcer. Pain and swelling along the fifth toe. Abnormal x-rays. EXAM: MRI OF THE RIGHT FOREFOOT WITHOUT AND WITH CONTRAST TECHNIQUE: Multiplanar, multisequence MR imaging of the right foot was performed before and after the administration of intravenous contrast. CONTRAST:  18mL  MULTIHANCE GADOBENATE DIMEGLUMINE 529 MG/ML IV SOLN COMPARISON:  Radiographs 08/22/2016 FINDINGS: MR findings consistent with septic arthritis involving the fifth MTP joint with osteomyelitis involving the fifth metatarsal head and fifth proximal phalanx. There is an associated large dissecting abscess surrounding the MTP joint. The dorsal component of the abscess measures approximately 13 x 13 mm. It extends down between the fourth and fifth metatarsal heads and extends out through the plantar aspect of the foot through an open wound. The Small foreign body was noted near the distal phalanx of the great toe. There is signal abnormality in this area but no abscess. Possible foreign body reaction. Diffuse and fairly marked cellulitis and myofasciitis involving the forefoot and midfoot. IMPRESSION: 1. Septic arthritis involving the fifth MTP joint with associated osteomyelitis in the fifth metatarsal head and fifth proximal phalanx. 2. Large dissecting abscess surrounding the fifth MTP joint 3. Diffuse forefoot and midfoot cellulitis and myofasciitis. Electronically Signed   By: Rudie Meyer M.D.   On: 08/23/2016 08:51   Dg Foot Complete Right  Result Date: 08/22/2016 CLINICAL DATA:  Osteomyelitis of the right foot, wound to the underside of the right foot and fifth toe EXAM: RIGHT FOOT COMPLETE - 3+ VIEW COMPARISON:  07/17/2016 FINDINGS: Thin metallic opacity overlying the distal soft tissues of the first digit is unchanged. Small lucent lesion at the base of the first distal phalanx also unchanged. Soft tissue gas at the fifth digit. Suspect cortical erosive changes along the medial base of the fifth proximal phalanx. No joint space narrowing. No fracture. IMPRESSION: 1. Diffuse soft tissue gas within the fifth digit. 2. Suspect cortical erosive changes at the base of the fifth proximal phalanx as may be seen with osteomyelitis. 3. Stable linear possible foreign body distal great toe Electronically Signed   By:  Jasmine Pang M.D.   On: 08/22/2016 20:42     Medications:  Scheduled: . allopurinol  300 mg Oral Daily  . amitriptyline  100 mg Oral QHS  . ceFEPime (MAXIPIME) IV  1 g Intravenous Q8H  . chlorhexidine  15 mL Mouth Rinse BID  . enoxaparin (LOVENOX) injection  60 mg Subcutaneous Q24H  . gabapentin  1,200 mg Oral TID  . insulin aspart  0-20 Units Subcutaneous TID WC  . insulin aspart  0-5 Units Subcutaneous QHS  . insulin glargine  30 Units Subcutaneous BID  . mouth rinse  15 mL Mouth Rinse q12n4p  . metroNIDAZOLE  500 mg Oral Q8H  . rosuvastatin  20 mg Oral Daily  . vancomycin  1,250 mg Intravenous Q24H   Continuous: . sodium chloride 100 mL/hr at 08/23/16 0700   UJN:PVFAWNOPWKHIP **OR** acetaminophen, HYDROcodone-acetaminophen, ondansetron **OR** ondansetron (ZOFRAN) IV  Assessment/Plan:  Principal Problem:   Diabetic foot  infection ([place]) Active Problems:   DM type 2, uncontrolled, with neuropathy ([place])   Gout   HTN (hypertension)   AKI (acute kidney injury) ([place])   SIRS (systemic inflammatory response syndrome) ([facility])   Leukocytosis    Septic arthritis with abscess and osteomyelitis involving the Right fifth MTP joint  MRI findings reviewed. Discussed with orthopedic surgeon this morning. They will consult. Patient is currently nothing by mouth. Continue vancomycin, cefepime and Flagyl for now. WBC was elevated and slightly better this morning. Was not noted to be overtly septic. CRP 21 and ESR 132. EKG does not show any ischemic changes. Follow-up on blood cultures.   AKI on CKD 3 Baseline creatinine appears to be around 1.2. It was 1.64 at admission. Patient was given fluids with improvement. Continue to monitor urine output. Holding lisinopril and metformin.  Hyperlipidemia  Continue statin   Diabetes mellitus type 2 with peripheral neuropathy  Poorly controlled with a hemoglobin A1c of 9.7 in March. Continue Lantus but will increase the dose. Continue SSI.    History of gout Allopurinol  Hematuria with a known history of nephrolithiasis UA does show a hemoglobin with numerous RBCs. Patient has a history of kidney stone and microscopic hematuria in the past. CT scan of the abdomen from 2015 did show a left kidney stone. Patient is asymptomatic at this time. Will need further evaluation as an outpatient.  Diabetic neuropathy Continue Neurontin, Elavil  Chronic pain, narcotic dependence Hydrocodone q4h  Recent stressors He recently separated from his wife, which was unexpected for him. He is also taking care of his elderly mother without any help. Chaplain.   DVT Prophylaxis: Lovenox    Code Status: Full code  Family Communication: Discussed with the patient  Disposition Plan: Patient is outlined above. Await orthopedic input.    LOS: 1 day   [facility] Pager [phone] 08/23/2016, 11:17 AM  If 7PM-7AM, please contact night-coverage at [email or web address], password [facility]

## 2016-08-23 NOTE — Progress Notes (Signed)
Text paged: A. Hugelmeyer: "[address]: Pt oral temp. 100.84F. Making you aware." Will continue to monitor.

## 2016-08-23 NOTE — Consult Note (Signed)
ORTHOPAEDIC CONSULTATION  REQUESTING PHYSICIAN: Bonnielee Haff, MD  Chief Complaint: right DFU and osteo  HPI: [name] Snow is a 51 y.o. male who presents with worsening right DFU for 6-8 weeks.  Originally started as a blister on the top of his foot.  Was told to present to ED from urgent care.  Patient is a uncontrolled diabetic with peripheral neuropathy, CKD 3, HTN, and gout.  He has failed augmentin as outpatient.  He's had multiple social stressors recently that's caused him to neglect his foot.  He denies constitutional symptoms.    Past Medical History:  Diagnosis Date  . Arthritis    gout  . Cataract   . CHF (congestive heart failure) ([place])   . Chronic kidney disease   . Clotting disorder ([place])   . Diabetes mellitus   . Hypertension    Past Surgical History:  Procedure Laterality Date  . JOINT REPLACEMENT  [id number]   R Hip   Social History   Social History  . Marital status: Married    Spouse name: N/A  . Number of children: N/A  . Years of education: N/A   Social History Main Topics  . Smoking status: Never Smoker  . Smokeless tobacco: Never Used  . Alcohol use No  . Drug use: No  . Sexual activity: Yes    Birth control/ protection: None   Other Topics Concern  . None   Social History Narrative  . None   Family History  Problem Relation Age of Onset  . Mental illness Mother   . Hypertension Mother   . Hyperlipidemia Mother   . Mental illness Father   . COPD Father   . Mental illness Brother    - negative except otherwise stated in the family history section No Known Allergies Prior to Admission medications   Medication Sig Start Date End Date Taking? Authorizing Provider  allopurinol (ZYLOPRIM) 300 MG tablet Take 1 tablet (300 mg total) by mouth daily. Patient taking differently: Take 300 mg by mouth daily as needed (flare up).  06/27/15  Yes Leandrew Koyanagi, MD  amitriptyline (ELAVIL) 100 MG tablet Take 1 tablet (100 mg total) by mouth at  bedtime. Patient taking differently: Take 100 mg by mouth at bedtime as needed for sleep.  07/22/16  Yes Mancel Bale, PA-C  Blood Glucose Monitoring Suppl (ONE TOUCH ULTRA SYSTEM KIT) w/Device KIT Use as directed.  DX Code: E11.40 06/27/15  Yes Leandrew Koyanagi, MD  clotrimazole-betamethasone (LOTRISONE) cream Apply 1 application topically 2 (two) times daily. Patient taking differently: Apply 1 application topically 2 (two) times daily as needed (dry, raw skin).  04/25/16  Yes Mancel Bale, PA-C  Dulaglutide (TRULICITY) 1.5 HB/7.1IR SOPN Inject 1.5 mg into the skin once a week. 07/24/16  Yes Mancel Bale, PA-C  gabapentin (NEURONTIN) 600 MG tablet Take 2 tablets (1,200 mg total) by mouth 3 (three) times daily. Patient taking differently: Take 1,200 mg by mouth 2 (two) times daily.  07/22/16  Yes Mancel Bale, PA-C  HYDROcodone-acetaminophen (NORCO) 10-325 MG tablet Take 1 tablet by mouth every 4 (four) hours as needed. Patient taking differently: Take 1 tablet by mouth every 4 (four) hours.  07/22/16  Yes Mancel Bale, PA-C  Insulin Glargine (LANTUS SOLOSTAR) 100 UNIT/ML Solostar Pen Inject 30 Units into the skin 2 (two) times daily. 07/22/16  Yes Mancel Bale, PA-C  Insulin Syringe-Needle U-100 (INSULIN SYRINGE 1CC/31GX5/16") 31G X 5/16" 1 ML MISC Use as  directed once a day. Dx Code: 250.00 01/30/14  Yes Leandrew Koyanagi, MD  lisinopril (PRINIVIL,ZESTRIL) 10 MG tablet Take 0.5 tablets (5 mg total) by mouth daily. Patient taking differently: Take 5 mg by mouth every morning.  04/25/16  Yes Sarah Alleen Borne, PA-C  [facility] DELICA LANCETS 42A MISC Use as directed twice a day. DX Code: E11.40 06/27/15  Yes Leandrew Koyanagi, MD  rosuvastatin (CRESTOR) 20 MG tablet Take 1 tablet (20 mg total) by mouth daily. Patient taking differently: Take 20 mg by mouth every evening.  07/22/16  Yes Mancel Bale, PA-C  amoxicillin-clavulanate (AUGMENTIN) 875-125 MG tablet Take 1 tablet by mouth 2 (two) times daily. Patient  not taking: Reported on 08/22/2016 07/17/16   Mancel Bale, PA-C  glucose blood test strip Use as directed twice a day. DX Code: E11.40 07/22/16   Mancel Bale, PA-C  metFORMIN (GLUCOPHAGE-XR) 500 MG 24 hr tablet Take 2 tablets (1,000 mg total) by mouth 2 (two) times daily. Patient not taking: Reported on 08/22/2016 07/22/16   Mancel Bale, PA-C   Mr Foot Right W Wo Contrast  Result Date: 08/23/2016 CLINICAL DATA:  Diabetic foot ulcer. Pain and swelling along the fifth toe. Abnormal x-rays. EXAM: MRI OF THE RIGHT FOREFOOT WITHOUT AND WITH CONTRAST TECHNIQUE: Multiplanar, multisequence MR imaging of the right foot was performed before and after the administration of intravenous contrast. CONTRAST:  32m MULTIHANCE GADOBENATE DIMEGLUMINE 529 MG/ML IV SOLN COMPARISON:  Radiographs 08/22/2016 FINDINGS: MR findings consistent with septic arthritis involving the fifth MTP joint with osteomyelitis involving the fifth metatarsal head and fifth proximal phalanx. There is an associated large dissecting abscess surrounding the MTP joint. The dorsal component of the abscess measures approximately 13 x 13 mm. It extends down between the fourth and fifth metatarsal heads and extends out through the plantar aspect of the foot through an open wound. The Small foreign body was noted near the distal phalanx of the great toe. There is signal abnormality in this area but no abscess. Possible foreign body reaction. Diffuse and fairly marked cellulitis and myofasciitis involving the forefoot and midfoot. IMPRESSION: 1. Septic arthritis involving the fifth MTP joint with associated osteomyelitis in the fifth metatarsal head and fifth proximal phalanx. 2. Large dissecting abscess surrounding the fifth MTP joint 3. Diffuse forefoot and midfoot cellulitis and myofasciitis. Electronically Signed   By: PMarijo SanesM.D.   On: 08/23/2016 08:51   Dg Foot Complete Right  Result Date: 08/22/2016 CLINICAL DATA:  Osteomyelitis of the right foot,  wound to the underside of the right foot and fifth toe EXAM: RIGHT FOOT COMPLETE - 3+ VIEW COMPARISON:  07/17/2016 FINDINGS: Thin metallic opacity overlying the distal soft tissues of the first digit is unchanged. Small lucent lesion at the base of the first distal phalanx also unchanged. Soft tissue gas at the fifth digit. Suspect cortical erosive changes along the medial base of the fifth proximal phalanx. No joint space narrowing. No fracture. IMPRESSION: 1. Diffuse soft tissue gas within the fifth digit. 2. Suspect cortical erosive changes at the base of the fifth proximal phalanx as may be seen with osteomyelitis. 3. Stable linear possible foreign body distal great toe Electronically Signed   By: KDonavan FoilM.D.   On: 08/22/2016 20:42   - pertinent xrays, CT, MRI studies were reviewed and independently interpreted  Positive ROS: All other systems have been reviewed and were otherwise negative with the exception of those mentioned in the HPI and as  above.  Physical Exam: General: Alert, no acute distress Cardiovascular: No pedal edema Respiratory: No cyanosis, no use of accessory musculature GI: No organomegaly, abdomen is soft and non-tender Skin: No lesions in the area of chief complaint Neurologic: Sensation decreased in the foot Psychiatric: Patient is competent for consent with normal mood and affect Lymphatic: No axillary or cervical lymphadenopathy  MUSCULOSKELETAL:  - severe swelling, cellulitis, malodor of foot - frank pus draining from 4th webspace - dry plantar ulcer over 5th MT head  Assessment: Right 5th toe, metatarsal osteo, abscess  Plan: - needs I&D and 5th ray amp tomorrow - NPO after midnight - patient understands r/b/a to surgery and agrees to proceed  Thank you for the consult and the opportunity to see Mr. Pranish Akhavan. Eduard Roux, MD [place] [phone] 8:21 PM

## 2016-08-23 NOTE — Progress Notes (Signed)
Chaplain responds to consult that pt is experiencing major life transition.  Pt greets chaplain and welcomes chaplain into room for visit.  Pt introduces spouse who is sitting bedside, does not speak during visit except to answer a phone call during visit.  Pt shares about caring for his mother at home who has dementia and the strain it has placed on his marriage and relationship with 53 year old child.  Chaplain validates pt by clarifying that pt has a lot of responsibilities.  Pt reframes by including his spouse saying, "We've got a lot going on right now."  Pt then shares that he and spouse are separated; pt expresses remorse for "not putting them first."  This conversation leads pt to share about his feeling overwhelmed with addressing his health now and that he believes he is unable to beat diabetes.  Chaplain normalizes pt's feeling of being overwhelmed, pt begins to weep for which he immediately apologizes.  Chaplain again offers affirmation for pt to continue expressing his feelings.  Pt then shares his world view and how he feels like he can do nothing to improve the world. Chaplain again checks in with pt clarifying if he feels that he has a lot going on right now.  Pt shares he does feel overwhelmed and feels he needs to work on his health.  Pt again apologizes for tears then says he will be ok.  Chaplain remains silent several minutes to allow pt to process the visit.  Pt and spouse both express appreciation for visit as chaplain departs.  Please page chaplain should further spiritual support be needed.  Erroll Luna, Chaplain 08/23/16 2:42 PM    08/23/16 1315  Clinical Encounter Type  Visited With Patient and family together;Health care provider  Visit Type Initial;Psychological support;Spiritual support;Social support  Referral From Nurse  Consult/Referral To Chaplain;Social work  Spiritual Encounters  Spiritual Needs Emotional  Stress Factors  Patient Stress Factors Family  relationships;Health changes;Major life changes  Family Stress Factors Family relationships

## 2016-08-23 NOTE — Progress Notes (Signed)
Text paged A. Hugelmeyer: "[address]: pt recheck oral temp now 102.64F. Will administer PRN tylenol as ordered. " Will continue monitor for changes.

## 2016-08-23 NOTE — Progress Notes (Signed)
Just spoke with on call hospitalist.  Ok to wait for MRI to arrive this morning and for patient to get MRI first thing this am.[name] Snow, Larey Dresser

## 2016-08-23 NOTE — Progress Notes (Signed)
Text paged A. Hugelmeyer: "[address]: recheck oral temp 1-hr post tylenol is 103F. Making you aware. Will continue to monitor."

## 2016-08-23 NOTE — Consult Note (Addendum)
WOC Nurse wound consult note Reason for Consult:Right foot, linear neuropathic ulcer with osteomyelitis and abscess. Orthopedics was  simultaneously consulted and an operative procedure is scheduled for tomorrow (according to bedside RN). Wound type:Infectious Pressure Injury POA: No Measurement: 5cm x 1cm with depth undetermined due to the presence of necrotic tissue Wound bed: dark (black) 75% with 25% yellow Drainage (amount, consistency, odor) malodorous prior to first saline dressing change, then improved. Periwound: intact, erythematous Dressing procedure/placement/frequency: OR is planned for tomorrow per bedside RN, but I see no note advising of this. Will implement twice daily saline dressings and elevation until operative procedure. WOC nursing team will not follow, but will remain available to this patient, the nursing and medical teams.  Please re-consult if needed. Thanks, Ladona Mow, MSN, RN, GNP, [place]  Pager# [phone]

## 2016-08-23 NOTE — Progress Notes (Addendum)
Patient ID: [name] Snow, male   DOB: 05/01/1966, 51 y.o.   MRN: [id number] Rounding for [facility], Dr. Ophelia Charter on call yesterday, no record of contact to him or need for follow up. On rounding list. Also a dry shallow ulcer of the R great toe plantar MTP, no erythrema, dry without signs of infection.  Right foot with erythrema, swelling and ulceration of the 5 th Toe at the level of MTP. Foul smelling ulcer. Elevated BS and fever with chills. MRI with right foot 5th toe MTP pyarthrosis and surrounding cellulitis. Antibiotics. Will need 5 ray resection/amputation.  I will see after formal consultation is requested.

## 2016-08-24 ENCOUNTER — Inpatient Hospital Stay (HOSPITAL_COMMUNITY): Payer: PPO | Admitting: Anesthesiology

## 2016-08-24 ENCOUNTER — Encounter (HOSPITAL_COMMUNITY)
Admission: EM | Disposition: A | Discharge status: Home Health | Payer: Self-pay | Source: Home / Self Care | Attending: Internal Medicine

## 2016-08-24 ENCOUNTER — Encounter (HOSPITAL_COMMUNITY): Payer: Self-pay | Admitting: Anesthesiology

## 2016-08-24 DIAGNOSIS — I10 Essential (primary) hypertension: Secondary | ICD-10-CM

## 2016-08-24 DIAGNOSIS — M86171 Other acute osteomyelitis, right ankle and foot: Secondary | ICD-10-CM

## 2016-08-24 HISTORY — PX: AMPUTATION: SHX166

## 2016-08-24 LAB — COMPREHENSIVE METABOLIC PANEL
ALT: 9 U/L — ABNORMAL LOW (ref 17–63)
ANION GAP: 5 (ref 5–15)
AST: 13 U/L — ABNORMAL LOW (ref 15–41)
Albumin: 3 g/dL — ABNORMAL LOW (ref 3.5–5.0)
Alkaline Phosphatase: 73 U/L (ref 38–126)
BILIRUBIN TOTAL: 0.7 mg/dL (ref 0.3–1.2)
BUN: 21 mg/dL — ABNORMAL HIGH (ref 6–20)
CO2: 25 mmol/L (ref 22–32)
Calcium: 8.4 mg/dL — ABNORMAL LOW (ref 8.9–10.3)
Chloride: 107 mmol/L (ref 101–111)
Creatinine, Ser: 1.43 mg/dL — ABNORMAL HIGH (ref 0.61–1.24)
GFR calc Af Amer: >60 mL/min (ref >60)
GFR, EST NON AFRICAN AMERICAN: 55 mL/min — AB (ref >60)
Glucose, Bld: 160 mg/dL — ABNORMAL HIGH (ref 65–99)
POTASSIUM: 4.1 mmol/L (ref 3.5–5.1)
Sodium: 137 mmol/L (ref 135–145)
Total Protein: 7.7 g/dL (ref 6.5–8.1)

## 2016-08-24 LAB — HEMOGLOBIN A1C
HEMOGLOBIN A1C: 9.3 % — AB (ref 4.8–5.6)
Mean Plasma Glucose: 220 mg/dL

## 2016-08-24 LAB — CBC
HEMATOCRIT: 31 % — AB (ref 39.0–52.0)
Hemoglobin: 9.8 g/dL — ABNORMAL LOW (ref 13.0–17.0)
MCH: 27 pg (ref 26.0–34.0)
MCHC: 31.6 g/dL (ref 30.0–36.0)
MCV: 85.4 fL (ref 78.0–100.0)
Platelets: 324 10*3/uL (ref 150–400)
RBC: 3.63 MIL/uL — ABNORMAL LOW (ref 4.22–5.81)
RDW: 12.9 % (ref 11.5–15.5)
WBC: 12.9 10*3/uL — ABNORMAL HIGH (ref 4.0–10.5)

## 2016-08-24 LAB — GLUCOSE, CAPILLARY
GLUCOSE-CAPILLARY: 220 mg/dL — AB (ref 65–99)
GLUCOSE-CAPILLARY: 186 mg/dL — AB (ref 65–99)
Glucose-Capillary: 164 mg/dL — ABNORMAL HIGH (ref 65–99)
Glucose-Capillary: 150 mg/dL — ABNORMAL HIGH (ref 65–99)

## 2016-08-24 SURGERY — AMPUTATION, FOOT, RAY
Anesthesia: General | Site: Foot | Laterality: Right

## 2016-08-24 MED ORDER — PROMETHAZINE HCL 25 MG/ML IJ SOLN: 6.2500 mg | INTRAMUSCULAR | Status: DC | PRN

## 2016-08-24 MED ORDER — LIDOCAINE 2% (20 MG/ML) 5 ML SYRINGE
INTRAMUSCULAR | Status: AC
  Filled 2016-08-24: qty 5

## 2016-08-24 MED ORDER — EPHEDRINE 5 MG/ML INJ
INTRAVENOUS | Status: AC
  Filled 2016-08-24: qty 10

## 2016-08-24 MED ORDER — MORPHINE SULFATE (PF) 10 MG/ML IV SOLN: 1.0000 mg | INTRAVENOUS | Status: DC | PRN

## 2016-08-24 MED ORDER — PHENYLEPHRINE 40 MCG/ML (10ML) SYRINGE FOR IV PUSH (FOR BLOOD PRESSURE SUPPORT)
PREFILLED_SYRINGE | INTRAVENOUS | Status: AC
  Filled 2016-08-24: qty 10

## 2016-08-24 MED ORDER — MEPERIDINE HCL 50 MG/ML IJ SOLN: 6.2500 mg | INTRAMUSCULAR | Status: DC | PRN

## 2016-08-24 MED ORDER — PROPOFOL 10 MG/ML IV BOLUS
INTRAVENOUS | Status: AC
  Filled 2016-08-24: qty 20

## 2016-08-24 MED ORDER — ROCURONIUM BROMIDE 50 MG/5ML IV SOSY
PREFILLED_SYRINGE | INTRAVENOUS | Status: AC
  Filled 2016-08-24: qty 5

## 2016-08-24 MED ORDER — SORBITOL 70 % SOLN: 30.0000 mL | Freq: Every day | Status: DC | PRN

## 2016-08-24 MED ORDER — SODIUM CHLORIDE 0.9 % IV SOLN: INTRAVENOUS | Status: DC

## 2016-08-24 MED ORDER — LIDOCAINE 2% (20 MG/ML) 5 ML SYRINGE
INTRAMUSCULAR | Status: DC | PRN
  Administered 2016-08-24: 10 mg via INTRAVENOUS

## 2016-08-24 MED ORDER — ONDANSETRON HCL 4 MG/2ML IJ SOLN
INTRAMUSCULAR | Status: AC
  Filled 2016-08-24: qty 2

## 2016-08-24 MED ORDER — FENTANYL CITRATE (PF) 100 MCG/2ML IJ SOLN
INTRAMUSCULAR | Status: DC | PRN
  Administered 2016-08-24: 50 ug via INTRAVENOUS

## 2016-08-24 MED ORDER — ACETAMINOPHEN 325 MG PO TABS: 650.0000 mg | ORAL_TABLET | Freq: Four times a day (QID) | ORAL | Status: DC | PRN

## 2016-08-24 MED ORDER — OXYCODONE HCL 5 MG PO TABS: 5.0000 mg | ORAL_TABLET | ORAL | Status: DC | PRN

## 2016-08-24 MED ORDER — MAGNESIUM CITRATE PO SOLN: 1.0000 | Freq: Once | ORAL | Status: DC | PRN

## 2016-08-24 MED ORDER — METOCLOPRAMIDE HCL 5 MG PO TABS: 5.0000 mg | ORAL_TABLET | Freq: Three times a day (TID) | ORAL | Status: DC | PRN

## 2016-08-24 MED ORDER — PHENYLEPHRINE HCL 10 MG/ML IJ SOLN
INTRAMUSCULAR | Status: AC
  Filled 2016-08-24: qty 1

## 2016-08-24 MED ORDER — ONDANSETRON HCL 4 MG PO TABS: 4.0000 mg | ORAL_TABLET | Freq: Four times a day (QID) | ORAL | Status: DC | PRN

## 2016-08-24 MED ORDER — ALBUMIN HUMAN 5 % IV SOLN
INTRAVENOUS | Status: AC
  Filled 2016-08-24: qty 500

## 2016-08-24 MED ORDER — 0.9 % SODIUM CHLORIDE (POUR BTL) OPTIME
TOPICAL | Status: DC | PRN
  Administered 2016-08-24: 1000 mL

## 2016-08-24 MED ORDER — HYDROMORPHONE HCL 1 MG/ML IJ SOLN: 0.2500 mg | INTRAMUSCULAR | Status: DC | PRN

## 2016-08-24 MED ORDER — LACTATED RINGERS IV SOLN: INTRAVENOUS | Status: DC

## 2016-08-24 MED ORDER — METOCLOPRAMIDE HCL 5 MG/ML IJ SOLN: 5.0000 mg | Freq: Three times a day (TID) | INTRAMUSCULAR | Status: DC | PRN

## 2016-08-24 MED ORDER — MIDAZOLAM HCL 5 MG/5ML IJ SOLN
INTRAMUSCULAR | Status: DC | PRN
  Administered 2016-08-24: 2 mg via INTRAVENOUS

## 2016-08-24 MED ORDER — DEXAMETHASONE SODIUM PHOSPHATE 10 MG/ML IJ SOLN
INTRAMUSCULAR | Status: AC
  Filled 2016-08-24: qty 1

## 2016-08-24 MED ORDER — METHOCARBAMOL 500 MG PO TABS: 500.0000 mg | ORAL_TABLET | Freq: Four times a day (QID) | ORAL | Status: DC | PRN

## 2016-08-24 MED ORDER — MIDAZOLAM HCL 2 MG/2ML IJ SOLN
INTRAMUSCULAR | Status: AC
  Filled 2016-08-24: qty 2

## 2016-08-24 MED ORDER — METHOCARBAMOL 1000 MG/10ML IJ SOLN: 500.0000 mg | Freq: Four times a day (QID) | INTRAVENOUS | Status: DC | PRN

## 2016-08-24 MED ORDER — CEFAZOLIN SODIUM-DEXTROSE 2-4 GM/100ML-% IV SOLN: 2.0000 g | Freq: Four times a day (QID) | INTRAVENOUS | Status: DC

## 2016-08-24 MED ORDER — PROPOFOL 10 MG/ML IV BOLUS
INTRAVENOUS | Status: DC | PRN
  Administered 2016-08-24: 200 mg via INTRAVENOUS

## 2016-08-24 MED ORDER — ONDANSETRON HCL 4 MG/2ML IJ SOLN
INTRAMUSCULAR | Status: DC | PRN
  Administered 2016-08-24: 4 mg via INTRAVENOUS

## 2016-08-24 MED ORDER — ACETAMINOPHEN 650 MG RE SUPP: 650.0000 mg | Freq: Four times a day (QID) | RECTAL | Status: DC | PRN

## 2016-08-24 MED ORDER — ONDANSETRON HCL 4 MG/2ML IJ SOLN: 4.0000 mg | Freq: Four times a day (QID) | INTRAMUSCULAR | Status: DC | PRN

## 2016-08-24 MED ORDER — DIPHENHYDRAMINE HCL 12.5 MG/5ML PO ELIX: 25.0000 mg | ORAL_SOLUTION | ORAL | Status: DC | PRN

## 2016-08-24 MED ORDER — LACTATED RINGERS IV SOLN
INTRAVENOUS | Status: DC | PRN
  Administered 2016-08-24: 07:00:00 via INTRAVENOUS

## 2016-08-24 MED ORDER — FENTANYL CITRATE (PF) 100 MCG/2ML IJ SOLN
INTRAMUSCULAR | Status: AC
  Filled 2016-08-24: qty 2

## 2016-08-24 MED ORDER — POLYETHYLENE GLYCOL 3350 17 G PO PACK: 17.0000 g | PACK | Freq: Every day | ORAL | Status: DC | PRN

## 2016-08-24 SURGICAL SUPPLY — 37 items
BAG SPEC THK2 15X12 ZIP CLS (MISCELLANEOUS) ×1
BAG ZIPLOCK 12X15 (MISCELLANEOUS) ×2 IMPLANT
BANDAGE ESMARK 6X9 LF (GAUZE/BANDAGES/DRESSINGS) ×1 IMPLANT
BLADE OSCILLATING/SAGITTAL (BLADE) ×2
BLADE SW THK.38XMED LNG THN (BLADE) IMPLANT
BNDG CMPR 9X6 STRL LF SNTH (GAUZE/BANDAGES/DRESSINGS) ×1
BNDG COHESIVE 3X5 TAN STRL LF (GAUZE/BANDAGES/DRESSINGS) ×2 IMPLANT
BNDG COHESIVE 6X5 TAN STRL LF (GAUZE/BANDAGES/DRESSINGS) ×2 IMPLANT
BNDG ESMARK 6X9 LF (GAUZE/BANDAGES/DRESSINGS) ×2
COVER SURGICAL LIGHT HANDLE (MISCELLANEOUS) ×2 IMPLANT
CUFF TOURN SGL QUICK 34 (TOURNIQUET CUFF) ×2
CUFF TRNQT CYL 34X4X40X1 (TOURNIQUET CUFF) ×1 IMPLANT
DRAPE SHEET LG 3/4 BI-LAMINATE (DRAPES) ×2 IMPLANT
DRAPE SURG 17X11 SM STRL (DRAPES) ×4 IMPLANT
DRAPE U-SHAPE 47X51 STRL (DRAPES) ×4 IMPLANT
DRSG ADAPTIC 3X8 NADH LF (GAUZE/BANDAGES/DRESSINGS) ×2 IMPLANT
DRSG VAC ATS SM SENSATRAC (GAUZE/BANDAGES/DRESSINGS) ×1 IMPLANT
DURAPREP 26ML APPLICATOR (WOUND CARE) ×2 IMPLANT
ELECT REM PT RETURN 15FT ADLT (MISCELLANEOUS) ×2 IMPLANT
GAUZE SPONGE 4X4 12PLY STRL (GAUZE/BANDAGES/DRESSINGS) ×4 IMPLANT
GLOVE BIOGEL PI IND STRL 7.5 (GLOVE) ×1 IMPLANT
GLOVE BIOGEL PI INDICATOR 7.5 (GLOVE) ×1
GLOVE SURG SS PI 7.5 STRL IVOR (GLOVE) ×2 IMPLANT
GOWN STRL REUS W/ TWL XL LVL3 (GOWN DISPOSABLE) ×1 IMPLANT
GOWN STRL REUS W/TWL XL LVL3 (GOWN DISPOSABLE) ×2
KIT BASIN OR (CUSTOM PROCEDURE TRAY) ×2 IMPLANT
MANIFOLD NEPTUNE II (INSTRUMENTS) ×2 IMPLANT
NS IRRIG 1000ML POUR BTL (IV SOLUTION) ×2 IMPLANT
PACK ORTHO EXTREMITY (CUSTOM PROCEDURE TRAY) ×2 IMPLANT
PAD CAST 4YDX4 CTTN HI CHSV (CAST SUPPLIES) ×1 IMPLANT
PADDING CAST COTTON 4X4 STRL (CAST SUPPLIES) ×2
POSITIONER SURGICAL ARM (MISCELLANEOUS) ×4 IMPLANT
STOCKINETTE 8 INCH (MISCELLANEOUS) ×2 IMPLANT
SUCTION FRAZIER HANDLE 10FR (MISCELLANEOUS) ×1
SUCTION TUBE FRAZIER 10FR DISP (MISCELLANEOUS) ×1 IMPLANT
SUT ETHILON 2 0 PSLX (SUTURE) ×4 IMPLANT
WATER STERILE IRR 1500ML POUR (IV SOLUTION) ×2 IMPLANT

## 2016-08-24 NOTE — H&P (Signed)
H&P update  The surgical history has been reviewed and remains accurate without interval change.  The patient was re-examined and patient's physiologic condition has not changed significantly in the last 30 days. The condition still exists that makes this procedure necessary. The treatment plan remains the same, without new options for care.  No new pharmacological allergies or types of therapy has been initiated that would change the plan or the appropriateness of the plan.  The patient and/or family understand the potential benefits and risks.  Mayra Reel, MD 08/24/2016 6:59 AM

## 2016-08-24 NOTE — Evaluation (Signed)
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  Pt admitted 08/22/16 with Right foot abscess and chronic osteomyelitis of right fifth metatarsal head and s/p I&D, right foot fifth ray amputation, and application of wound VAC  Clinical Impression  Pt admitted with above diagnosis. Pt currently with functional limitations due to the deficits listed below (see PT Problem List).  Pt will benefit from skilled PT to increase their independence and safety with mobility to allow discharge to the venue listed below.  Pt really wishing to be able to use bathroom with nursing staff so assisted pt with ambulating with RW to bathroom.  Pt does maintain R LE NWB well.  Pt repositioned in recliner with R LE elevated.  Pt plans to transfer to [facility] for further surgery.  Pt will need to practice steps prior to d/c, and he is uncertain of d/c environment (his home vs separated spouse's home) at this time. Pt refuses SNF.     Follow Up Recommendations Home health PT;Other (comment) (pt refuses SNF, would benefit from aide or more home care)    Equipment Recommendations  Rolling walker with 5" wheels;3in1 (PT)    Recommendations for Other Services       Precautions / Restrictions Precautions Precautions: Fall Precaution Comments: wound vac Restrictions Weight Bearing Restrictions: Yes RLE Weight Bearing: Non weight bearing      Mobility  Bed Mobility Overal bed mobility: Needs Assistance Bed Mobility: Supine to Sit     Supine to sit: HOB elevated;Supervision     General bed mobility comments: supervision for managing lines  Transfers Overall transfer level: Needs  assistance Equipment used: Rolling walker (2 wheeled) Transfers: Sit to/from Stand Sit to Stand: Min guard         General transfer comment: verbal cues for hand placement and maintaining NWB  Ambulation/Gait Ambulation/Gait assistance: Min guard Ambulation Distance (Feet): 15 Feet Assistive device: Rolling walker (2 wheeled)       General Gait Details: pt maintained NWB R LE well with RW, ambulated to bathroom only as pt really wished to be able to use bathroom with nursing  Stairs            Wheelchair Mobility    Modified Rankin (Stroke Patients Only)       Balance                                             Pertinent Vitals/Pain Pain Assessment: No/denies pain    Home Living Family/patient expects to be discharged to:: Private residence Living Arrangements: Parent   Type of Home: House         Home Equipment: Crutches Additional Comments: pt reports his home is multiple level and he would need to perform at least a flight to be able to stay on one level once inside, considering staying with his wife (separated currently) who only has 3-4 steps to get into home    Prior Function Level of Independence: Independent               Hand Dominance  Extremity/Trunk Assessment        Lower Extremity Assessment Lower Extremity Assessment: RLE deficits/detail RLE Deficits / Details: able to perform SLR, wound vac in place       Communication   Communication: No difficulties  Cognition Arousal/Alertness: Awake/alert Behavior During Therapy: WFL for tasks assessed/performed Overall Cognitive Status: Within Functional Limits for tasks assessed                                        General Comments      Exercises     Assessment/Plan    PT Assessment Patient needs continued PT services  PT Problem List Decreased mobility;Impaired sensation;Decreased knowledge of use of DME;Decreased activity  tolerance;Decreased knowledge of precautions       PT Treatment Interventions DME instruction;Gait training;Therapeutic activities;Therapeutic exercise;Functional mobility training;Stair training;Patient/family education    PT Goals (Current goals can be found in the Care Plan section)  Acute Rehab PT Goals Patient Stated Goal: ambulate again, pt appears concerned with care of his mother (has dementia) while he is recovering PT Goal Formulation: With patient Time For Goal Achievement: 09/07/16 Potential to Achieve Goals: Good    Frequency Min 3X/week   Barriers to discharge        Co-evaluation               End of Session   Activity Tolerance: Patient tolerated treatment well Patient left: in chair;with call bell/phone within reach;with chair alarm set Nurse Communication: Mobility status PT Visit Diagnosis: Other abnormalities of gait and mobility (R26.89)    Time: 4098-1191 PT Time Calculation (min) (ACUTE ONLY): 28 min   Charges:   PT Evaluation $PT Eval Low Complexity: 1 Procedure     PT G CodesZenovia Jarred, PT, DPT 08/24/2016 Pager: [phone]   Maida Sale E 08/24/2016, 3:38 PM

## 2016-08-24 NOTE — Anesthesia Postprocedure Evaluation (Addendum)
Anesthesia Post Note  Patient: [name] Snow  Procedure(s) Performed: Procedure(s) (LRB): IRRIGATION AND DEBRIDEMENT OF RIGHT FOOT AND AMPUTATION OF RIGHT FIFTH RAY (Right)  Patient location during evaluation: PACU Anesthesia Type: General Level of consciousness: awake and alert Pain management: pain level controlled Vital Signs Assessment: post-procedure vital signs reviewed and stable Respiratory status: spontaneous breathing, nonlabored ventilation, respiratory function stable and patient connected to nasal cannula oxygen Cardiovascular status: blood pressure returned to baseline and stable Postop Assessment: no signs of nausea or vomiting Anesthetic complications: no       Last Vitals:  Vitals:   08/24/16 0954 08/24/16 1013  BP: 125/73 135/79  Pulse: 62 63  Resp: 13 16  Temp: 37.1 C 36.9 C    Last Pain:  Vitals:   08/24/16 0954  TempSrc:   PainSc: 0-No pain                 Shelton Silvas

## 2016-08-24 NOTE — Anesthesia Preprocedure Evaluation (Signed)
Anesthesia Evaluation  Patient identified by MRN, date of birth, ID band Patient awake    Reviewed: Allergy & Precautions, NPO status , Patient's Chart, lab work & pertinent test results  Airway Mallampati: II       Dental  (+) Poor Dentition, Missing, Chipped, Loose   Pulmonary neg pulmonary ROS,    breath sounds clear to auscultation       Cardiovascular hypertension, Pt. on medications +CHF   Rhythm:Regular Rate:Normal     Neuro/Psych  Neuromuscular disease negative psych ROS   GI/Hepatic negative GI ROS, Neg liver ROS,   Endo/Other  diabetes, Type 2, Insulin Dependent, Oral Hypoglycemic Agents  Renal/GU CRFRenal disease  negative genitourinary   Musculoskeletal  (+) Arthritis , Osteoarthritis,    Abdominal   Peds negative pediatric ROS (+)  Hematology negative hematology ROS (+)   Anesthesia Other Findings   Reproductive/Obstetrics negative OB ROS                             Lab Results  Component Value Date   WBC 12.9 (H) 08/24/2016   HGB 9.8 (L) 08/24/2016   HCT 31.0 (L) 08/24/2016   MCV 85.4 08/24/2016   PLT 324 08/24/2016   Lab Results  Component Value Date   CREATININE 1.43 (H) 08/24/2016   BUN 21 (H) 08/24/2016   NA 137 08/24/2016   K 4.1 08/24/2016   CL 107 08/24/2016   CO2 25 08/24/2016   Lab Results  Component Value Date   INR 0.99 08/22/2016   INR 0.99 05/23/2010   EKG: normal sinus rhythm.  Anesthesia Physical Anesthesia Plan  ASA: III  Anesthesia Plan: General   Post-op Pain Management:    Induction: Intravenous  Airway Management Planned: LMA  Additional Equipment:   Intra-op Plan:   Post-operative Plan: Extubation in OR  Informed Consent: I have reviewed the patients History and Physical, chart, labs and discussed the procedure including the risks, benefits and alternatives for the proposed anesthesia with the patient or authorized  representative who has indicated his/her understanding and acceptance.   Dental advisory given  Plan Discussed with: CRNA  Anesthesia Plan Comments:         Anesthesia Quick Evaluation

## 2016-08-24 NOTE — Op Note (Signed)
   Date of Surgery: 08/24/2016  INDICATIONS: [name] Snow is a 51 y.o.-year-old male with a right foot osteomyelitis and abscess that failed oral antibiotics;  The patient did consent to the procedure after discussion of the risks and benefits.  PREOPERATIVE DIAGNOSIS: Right foot abscess and chronic osteomyelitis of right fifth metatarsal head  POSTOPERATIVE DIAGNOSIS: Same.  PROCEDURE:  1. Irrigation and debridement of bone, muscle, subcutaneous tissue, tendon, skin of right foot 8 x 6 cm 2. Right foot fifth ray amputation 3. Application of wound VAC less than 50 cm  SURGEON: [name] Snow, M.D.  ASSIST: None.  ANESTHESIA:  general  IV FLUIDS AND URINE: See anesthesia.  ESTIMATED BLOOD LOSS: Minimal mL.  IMPLANTS: None  DRAINS: Wound VAC  COMPLICATIONS: None.  DESCRIPTION OF PROCEDURE: The patient was brought to the operating room and placed supine on the operating table.  The patient had been signed prior to the procedure and this was documented. The patient had the anesthesia placed by the anesthesiologist.  A time-out was performed to confirm that this was the correct patient, site, side and location. The patient did receive antibiotics prior to the incision and was re-dosed during the procedure as needed at indicated intervals.  A tourniquet was placed and inflated to 300 mmHg.  The patient had the operative extremity prepped and draped in the standard surgical fashion.    I first made a racquet type incision on the lateral aspect of the foot based over the fifth metatarsal. Full-thickness flaps were created. Sharp dissection was taken down to the fifth metatarsal. Subperiosteal elevation was performed off of the fifth metatarsal. I then performed a fifth ray amputation with an oscillating saw through the mid shaft of the fifth metatarsal. After this was done I then performed sharp excisional debridement of the remaining fifth metatarsal, fourth metatarsal, fourth toe, muscle, soft  tissue and skin with Roger and knife. After thorough debridement of devitalized, nonviable, infected tissue I thoroughly irrigated the wound with 3 L of normal saline. The tourniquet was then deflated and there was punctate bleeding of the soft tissue surfaces. Hemostasis was obtained. I did partially closed the incision with interrupted 2-0 nylon sutures. The rest was dressed with a wound VAC and turned to -100 mmHg. Patient tolerated the procedure well and had no immediate complications.  POSTOPERATIVE PLAN: Patient will return need to return back to the operating room on Wednesday for another washout and reexamination. He will need to be transferred to, hospital for this.  [name] Reel, MD [facility] [phone] 9:11 AM

## 2016-08-24 NOTE — Progress Notes (Signed)
[facility] PROGRESS NOTE  [name] Snow KAJ:[id number] DOB: 12/24/1965 DOA: 08/22/2016  PCP: Elizabeth Sauer  Brief History/Interval Summary: [name] with a past medical history of uncontrolled type 2 diabetes (last known A1c greater than 10 per patient) complicated by neuropathy and CKD 3, HTN, HLD, gout, and chronic pain who has had issues with diabetic foot ulcer involving the right foot since March.  He was seen by an outpatient provider on 3/1 and prescribed Augmentin for cellulitis. Patient admitted that he had been neglecting his foot due to personal stressors. He recently separated from his wife and has been taking care of his sick mother. Presented with worsening swelling of the right foot. Initially went to urgent care center and was advised to come to the emergency department. Evaluation has revealed osteomyelitis and abscess. Patient underwent surgery  Reason for Visit: Right fifth toe osteomyelitis with abscess  Consultants: Orthopedics: Dr. Erlinda Hong  Procedures:   1. Irrigation and debridement of bone, muscle, subcutaneous tissue, tendon, skin of right foot 8 x 6 cm 2. Right foot fifth ray amputation 3. Application of wound VAC less than 50 cm  Antibiotics: Vancomycin and cefepime plus oral Flagyl  Subjective/Interval History: Patient was noted to be febrile last night. Taken to the operating room this morning. Patient seen after surgery. He feels well. Denies any pain currently. Denies any nausea or vomiting.   ROS: Negative chest pain or shortness of breath.  Objective:  Vital Signs  Vitals:   08/24/16 0930 08/24/16 0945 08/24/16 0954 08/24/16 1013  BP: 113/71 122/71 125/73 135/79  Pulse: (!) 58 60 62 63  Resp: _0 Temp: 98.5 F (36.9 C)  98.8 F (37.1 C) 98.5 F (36.9 C)  TempSrc:      SpO2: 100% 100% 100% 100%  Weight:      Height:        Intake/Output Summary (Last 24 hours) at 08/24/16 1149 Last data filed at  08/24/16 0953  Gross per 24 hour  Intake          3518.33 ml  Output                0 ml  Net          3518.33 ml   Filed Weights   08/22/16 1639 08/22/16 2141  Weight: 116.1 kg (256 lb) 118.5 kg (261 lb 3.2 oz)    General appearance: alert, cooperative, appears stated age and no distress Resp: clear to auscultation bilaterally Cardio: regular rate and rhythm, S1, S2 normal, no murmur, click, rub or gallop GI: soft, non-tender; bowel sounds normal; no masses,  no organomegaly Extremities: Wound VAC noted over the right foot.  Pulses: 2+ and symmetric Neurologic: Awake and alert. Oriented 3. No focal neurological deficits are noted.  Lab Results:  Data Reviewed: I have personally reviewed following labs and imaging studies  CBC:  Recent Labs Lab 08/22/16 1523 08/22/16 1838 08/23/16 0413 08/24/16 0415  WBC 17.6* 18.4* 13.8* 12.9*  NEUTROABS  --  14.3*  --   --   HGB 10.6* 9.9* 9.6* 9.8*  HCT 31.9* 30.5* 30.1* 31.0*  MCV 85.4 87.4 88.0 85.4  PLT  --  333 326 035    Basic Metabolic Panel:  Recent Labs Lab 08/22/16 1838 08/23/16 0413 08/24/16 0415  NA 134* 137 137  K 4.2 4.2 4.1  CL 101 104 107  CO2 _1 GLUCOSE 255* 254* 160*  BUN 44* 23* 21*  CREATININE 1.64* 1.37* 1.43*  CALCIUM 8.7* 8.7* 8.4*    GFR: Estimated Creatinine Clearance: 80.3 mL/min (A) (by C-G formula based on SCr of 1.43 mg/dL (H)).  Liver Function Tests:  Recent Labs Lab 08/22/16 2235 08/24/16 0415  AST 14* 13*  ALT 12* 9*  ALKPHOS 87 73  BILITOT 0.8 0.7  PROT 8.6* 7.7  ALBUMIN 3.3* 3.0*    Coagulation Profile:  Recent Labs Lab 08/22/16 2235  INR 0.99    CBG:  Recent Labs Lab 08/23/16 0901 08/23/16 1207 08/23/16 1645 08/23/16 2202 08/24/16 0915  GLUCAP 203* 226* 129* 170* 164*     Radiology Studies: Mr Foot Right W Wo Contrast  Result Date: 08/23/2016 CLINICAL DATA:  Diabetic foot ulcer. Pain and swelling along the fifth toe. Abnormal x-rays. EXAM: MRI  OF THE RIGHT FOREFOOT WITHOUT AND WITH CONTRAST TECHNIQUE: Multiplanar, multisequence MR imaging of the right foot was performed before and after the administration of intravenous contrast. CONTRAST:  53m MULTIHANCE GADOBENATE DIMEGLUMINE 529 MG/ML IV SOLN COMPARISON:  Radiographs 08/22/2016 FINDINGS: MR findings consistent with septic arthritis involving the fifth MTP joint with osteomyelitis involving the fifth metatarsal head and fifth proximal phalanx. There is an associated large dissecting abscess surrounding the MTP joint. The dorsal component of the abscess measures approximately 13 x 13 mm. It extends down between the fourth and fifth metatarsal heads and extends out through the plantar aspect of the foot through an open wound. The Small foreign body was noted near the distal phalanx of the great toe. There is signal abnormality in this area but no abscess. Possible foreign body reaction. Diffuse and fairly marked cellulitis and myofasciitis involving the forefoot and midfoot. IMPRESSION: 1. Septic arthritis involving the fifth MTP joint with associated osteomyelitis in the fifth metatarsal head and fifth proximal phalanx. 2. Large dissecting abscess surrounding the fifth MTP joint 3. Diffuse forefoot and midfoot cellulitis and myofasciitis. Electronically Signed   By: PMarijo SanesM.D.   On: 08/23/2016 08:51   Dg Foot Complete Right  Result Date: 08/22/2016 CLINICAL DATA:  Osteomyelitis of the right foot, wound to the underside of the right foot and fifth toe EXAM: RIGHT FOOT COMPLETE - 3+ VIEW COMPARISON:  07/17/2016 FINDINGS: Thin metallic opacity overlying the distal soft tissues of the first digit is unchanged. Small lucent lesion at the base of the first distal phalanx also unchanged. Soft tissue gas at the fifth digit. Suspect cortical erosive changes along the medial base of the fifth proximal phalanx. No joint space narrowing. No fracture. IMPRESSION: 1. Diffuse soft tissue gas within the  fifth digit. 2. Suspect cortical erosive changes at the base of the fifth proximal phalanx as may be seen with osteomyelitis. 3. Stable linear possible foreign body distal great toe Electronically Signed   By: KDonavan FoilM.D.   On: 08/22/2016 20:42     Medications:  Scheduled: . allopurinol  300 mg Oral Daily  . amitriptyline  100 mg Oral QHS  . ceFEPime (MAXIPIME) IV  1 g Intravenous Q8H  . chlorhexidine  15 mL Mouth Rinse BID  . enoxaparin (LOVENOX) injection  60 mg Subcutaneous Q24H  . gabapentin  1,200 mg Oral TID  . insulin aspart  0-20 Units Subcutaneous TID WC  . insulin aspart  0-5 Units Subcutaneous QHS  . insulin glargine  35 Units Subcutaneous BID  . mouth rinse  15 mL Mouth Rinse q12n4p  . metroNIDAZOLE  500 mg Oral Q8H  . rosuvastatin  20 mg Oral Daily  .  vancomycin  1,250 mg Intravenous Q24H   Continuous: . sodium chloride 100 mL/hr at 08/23/16 2209  . sodium chloride     VKP:[id number] **OR** acetaminophen, diphenhydrAMINE, HYDROcodone-acetaminophen, magnesium citrate, methocarbamol **OR** methocarbamol (ROBAXIN)  IV, metoCLOPramide **OR** metoCLOPramide (REGLAN) injection, morphine injection, ondansetron **OR** ondansetron (ZOFRAN) IV, oxyCODONE, polyethylene glycol, sorbitol  Assessment/Plan:  Principal Problem:   Diabetic foot infection (HCC) Active Problems:   DM type 2, uncontrolled, with neuropathy (HCC)   Gout   HTN (hypertension)   AKI (acute kidney injury) ([place])   SIRS (systemic inflammatory response syndrome) (HCC)   Leukocytosis   Acute osteomyelitis of metatarsal bone, right (HCC)    Septic arthritis with abscess and osteomyelitis involving the Right fifth MTP joint  Patient found to have septic arthritis along with abscess and osteomyelitis involving the right fifth MTP joint. CRP 21 and ESR 132.  Patient underwent surgery this morning. He underwent right fifth ray amputation. Wound VAC has been applied. He will need repeat surgery on  Wednesday. Discussed with Dr. Erlinda Hong this morning. Patient will be transferred to [facility] Monday or Tuesday. Discussed also with patient. Continue vancomycin, cefepime and Flagyl for now. WBC was elevated and appears to be improving. Blood cultures pending.   AKI on CKD 3 Baseline creatinine appears to be around 1.2. It was 1.64 at admission. Patient was given fluids with improvement. Continue to monitor urine output. Holding lisinopril and metformin.Avoid nephrotoxic agents including NSAIDs.  Hyperlipidemia  Continue statin   Diabetes mellitus type 2 with peripheral neuropathy  Poorly controlled with a hemoglobin A1c of 9.7 in March. Lantus dose was increased yesterday. Continue to monitor for now. Continue SSI.   History of gout Allopurinol  Hematuria with a known history of uric acid nephrolithiasis UA does show a hemoglobin with numerous RBCs. Patient has a history of kidney stone and microscopic hematuria in the past. CT scan of the abdomen from 2015 did show a left kidney stone. Patient is asymptomatic at this time. He has known history of uric acid stones.   Diabetic neuropathy Continue Neurontin, Elavil  Chronic pain, narcotic dependence Patient is on hydrocodone at home. This is being continued.  Recent stressors He recently separated from his wife, which was unexpected for him. He is also taking care of his elderly mother without any help. Chaplain.   DVT Prophylaxis: Lovenox    Code Status: Full code  Family Communication: Discussed with the patient  Disposition Plan: Management as outlined above. Transfer to [facility] on Monday or Tuesday for repeat surgery on Wednesday.    LOS: 2 days   [facility] Pager [phone] 08/24/2016, 11:49 AM  If 7PM-7AM, please contact night-coverage at [email or web address], password [facility]

## 2016-08-24 NOTE — Transfer of Care (Signed)
Immediate Anesthesia Transfer of Care Note  Patient: [name] Snow  Procedure(s) Performed: Procedure(s): IRRIGATION AND DEBRIDEMENT OF RIGHT FOOT AND AMPUTATION OF RIGHT FIFTH RAY (Right)  Patient Location: PACU  Anesthesia Type:General  Level of Consciousness: sedated  Airway & Oxygen Therapy: Patient Spontanous Breathing and Patient connected to face mask oxygen  Post-op Assessment: Report given to RN and Post -op Vital signs reviewed and stable  Post vital signs: Reviewed and stable  Last Vitals:  Vitals:   08/24/16 0257 08/24/16 0523  BP:  118/74  Pulse:  (!) 50  Resp:  20  Temp: 36.3 C 36.4 C    Last Pain:  Vitals:   08/24/16 0523  TempSrc: Oral  PainSc:       Patients Stated Pain Goal: 0 (08/22/16 2251)  Complications: No apparent anesthesia complications

## 2016-08-24 NOTE — Anesthesia Procedure Notes (Signed)
Procedure Name: LMA Insertion Date/Time: 08/24/2016 8:12 AM Performed by: Doran Clay Pre-anesthesia Checklist: Patient identified, Emergency Drugs available, Suction available and Patient being monitored Patient Re-evaluated:Patient Re-evaluated prior to inductionOxygen Delivery Method: Circle system utilized Preoxygenation: Pre-oxygenation with 100% oxygen Intubation Type: IV induction LMA: LMA inserted LMA Size: 4.0 Number of attempts: 2 (LMA 5 supreme attempted large leak, removed #4 regular LMA inserted without difficlulty) Placement Confirmation: breath sounds checked- equal and bilateral and positive ETCO2 Tube secured with: Tape Dental Injury: Teeth and Oropharynx as per pre-operative assessment

## 2016-08-25 ENCOUNTER — Telehealth: Payer: Self-pay | Admitting: Physician Assistant

## 2016-08-25 ENCOUNTER — Encounter: Payer: Self-pay | Admitting: Physician Assistant

## 2016-08-25 ENCOUNTER — Encounter (HOSPITAL_COMMUNITY): Payer: Self-pay | Admitting: Orthopaedic Surgery

## 2016-08-25 DIAGNOSIS — D649 Anemia, unspecified: Secondary | ICD-10-CM

## 2016-08-25 LAB — BASIC METABOLIC PANEL
ANION GAP: 5 (ref 5–15)
BUN: 26 mg/dL — ABNORMAL HIGH (ref 6–20)
CHLORIDE: 107 mmol/L (ref 101–111)
CO2: 26 mmol/L (ref 22–32)
Calcium: 8.4 mg/dL — ABNORMAL LOW (ref 8.9–10.3)
Creatinine, Ser: 1.21 mg/dL (ref 0.61–1.24)
GFR calc Af Amer: >60 mL/min (ref >60)
Glucose, Bld: 148 mg/dL — ABNORMAL HIGH (ref 65–99)
POTASSIUM: 4.2 mmol/L (ref 3.5–5.1)
SODIUM: 138 mmol/L (ref 135–145)

## 2016-08-25 LAB — CBC
HCT: 27.5 % — ABNORMAL LOW (ref 39.0–52.0)
HEMOGLOBIN: 8.9 g/dL — AB (ref 13.0–17.0)
MCH: 27.5 pg (ref 26.0–34.0)
MCHC: 32.4 g/dL (ref 30.0–36.0)
MCV: 84.9 fL (ref 78.0–100.0)
PLATELETS: 278 10*3/uL (ref 150–400)
RBC: 3.24 MIL/uL — AB (ref 4.22–5.81)
RDW: 12.9 % (ref 11.5–15.5)
WBC: 10.1 10*3/uL (ref 4.0–10.5)

## 2016-08-25 LAB — GLUCOSE, CAPILLARY
GLUCOSE-CAPILLARY: 206 mg/dL — AB (ref 65–99)
GLUCOSE-CAPILLARY: 217 mg/dL — AB (ref 65–99)
GLUCOSE-CAPILLARY: 188 mg/dL — AB (ref 65–99)
Glucose-Capillary: 163 mg/dL — ABNORMAL HIGH (ref 65–99)
Glucose-Capillary: 128 mg/dL — ABNORMAL HIGH (ref 65–99)

## 2016-08-25 MED ORDER — ADULT MULTIVITAMIN W/MINERALS CH
1.0000 | ORAL_TABLET | Freq: Every day | ORAL | Status: DC
  Administered 2016-08-25 – 2016-08-31 (×6): 1 via ORAL
  Filled 2016-08-25 (×6): qty 1

## 2016-08-25 MED ORDER — PREMIER PROTEIN SHAKE
11.0000 [oz_av] | Freq: Two times a day (BID) | ORAL | Status: DC
  Administered 2016-08-25 – 2016-08-31 (×7): 11 [oz_av] via ORAL
  Filled 2016-08-25 (×16): qty 325.31

## 2016-08-25 NOTE — Progress Notes (Signed)
Pharmacy Antibiotic Note  [name] Snow is a 51 y.o. male admitted on 08/22/2016 with cellulitis.  Pharmacy has been consulted for vancomycin dosing. Patient with uncontrolled DM presented to UC with R foot cellulitis and possible abscess, evaluation has revealed osteomyelitis with abscess.  Today, 08/25/2016   SCr improved to1.21 mg/dl (normalized CrCL = 72 ml/min)  WBC now WNL  afebrile  Plan:  Continue vancomycin  IV q24h (trough goal 15-20 mcg/mL)  Cefepime 1gm IV q8h  Check daily SCr,  using obesity nomogram  Check vanc trough in am  Height: 6' (182.9 cm) Weight: 261 lb 3.2 oz (118.5 kg) IBW/kg (Calculated) : 77.6  Temp (24hrs), Avg:98.4 F (36.9 C), Min:97.8 F (36.6 C), Max:99.4 F (37.4 C)   Recent Labs Lab 08/22/16 1523 08/22/16 1838 08/23/16 0413 08/24/16 0415 08/25/16 0439  WBC 17.6* 18.4* 13.8* 12.9* 10.1  CREATININE  --  1.64* 1.37* 1.43* 1.21    Estimated Creatinine Clearance: 94.9 mL/min (by C-G formula based on SCr of 1.21 mg/dL).    No Known Allergies  Antimicrobials this admission: 4/6 >> vancomycin >> 4/6 >> cefepime >> 4/6 << metronidazole >>  Dose adjustments this admission:  Microbiology results: 4/6 BCx: ngtd 4/7 MRSA PCR: neg   Thank you for allowing pharmacy to be a part of this patient's care.  Arley Phenix RPh 08/25/2016, 12:23 PM Pager [phone]

## 2016-08-25 NOTE — Progress Notes (Signed)
[facility] PROGRESS NOTE  [name] Snow HYI:[id number] DOB: August 12, 1965 DOA: 08/22/2016  PCP: Elizabeth Sauer  Brief History/Interval Summary: [name] with a past medical history of uncontrolled type 2 diabetes (last known A1c greater than 10 per patient) complicated by neuropathy and CKD 3, HTN, HLD, gout, and chronic pain who has had issues with diabetic foot ulcer involving the right foot since March.  He was seen by an outpatient provider on 3/1 and prescribed Augmentin for cellulitis. Patient admitted that he had been neglecting his foot due to personal stressors. He recently separated from his wife and has been taking care of his sick mother. Presented with worsening swelling of the right foot. Initially went to urgent care center and was advised to come to the emergency department. Evaluation has revealed osteomyelitis and abscess. Patient underwent surgery.  Reason for Visit: Right fifth toe osteomyelitis with abscess  Consultants: Orthopedics: Dr. Erlinda Hong  Procedures:   1. Irrigation and debridement of bone, muscle, subcutaneous tissue, tendon, skin of right foot 8 x 6 cm 2. Right foot fifth ray amputation 3. Application of wound VAC less than 50 cm  Antibiotics: Vancomycin and cefepime plus oral Flagyl  Subjective/Interval History: Patient feels better this morning. Denies any pain in his right foot. Hasn't had any further episodes of fever.   ROS: No chest pain or shortness of breath.  Objective:  Vital Signs  Vitals:   08/24/16 1335 08/24/16 2000 08/24/16 2050 08/25/16 0549  BP: (!) 136/59 (!) 161/68  129/64  Pulse: 73 74  65  Resp: '16 18  18  '$ Temp: 97.8 F (36.6 C) 98.3 F (36.8 C) 99.4 F (37.4 C) 98 F (36.7 C)  TempSrc: Oral Oral Oral Oral  SpO2: 97% 99%  97%  Weight:      Height:        Intake/Output Summary (Last 24 hours) at 08/25/16 0816 Last data filed at 08/25/16 0549  Gross per 24 hour  Intake             3500 ml    Output             1100 ml  Net             2400 ml   Filed Weights   08/22/16 1639 08/22/16 2141  Weight: 116.1 kg (256 lb) 118.5 kg (261 lb 3.2 oz)    General appearance: alert, cooperative, appears stated age and no distress Resp: clear to auscultation bilaterally Cardio: regular rate and rhythm, S1, S2 normal, no murmur, click, rub or gallop GI: soft, non-tender; bowel sounds normal; no masses,  no organomegaly Extremities: Wound VAC noted over the right foot.  Neurologic: Awake and alert. Oriented 3. No focal neurological deficits are noted.  Lab Results:  Data Reviewed: I have personally reviewed following labs and imaging studies  CBC:  Recent Labs Lab 08/22/16 1523 08/22/16 1838 08/23/16 0413 08/24/16 0415 08/25/16 0439  WBC 17.6* 18.4* 13.8* 12.9* 10.1  NEUTROABS  --  14.3*  --   --   --   HGB 10.6* 9.9* 9.6* 9.8* 8.9*  HCT 31.9* 30.5* 30.1* 31.0* 27.5*  MCV 85.4 87.4 88.0 85.4 84.9  PLT  --  333 326 324 786    Basic Metabolic Panel:  Recent Labs Lab 08/22/16 1838 08/23/16 0413 08/24/16 0415 08/25/16 0439  NA 134* 137 137 138  K 4.2 4.2 4.1 4.2  CL 101 104 107 107  CO2 '25 25 25 26  '$ GLUCOSE  255* 254* 160* 148*  BUN 44* 23* 21* 26*  CREATININE 1.64* 1.37* 1.43* 1.21  CALCIUM 8.7* 8.7* 8.4* 8.4*    GFR: Estimated Creatinine Clearance: 94.9 mL/min (by C-G formula based on SCr of 1.21 mg/dL).  Liver Function Tests:  Recent Labs Lab 08/22/16 2235 08/24/16 0415  AST 14* 13*  ALT 12* 9*  ALKPHOS 87 73  BILITOT 0.8 0.7  PROT 8.6* 7.7  ALBUMIN 3.3* 3.0*    Coagulation Profile:  Recent Labs Lab 08/22/16 2235  INR 0.99    CBG:  Recent Labs Lab 08/24/16 0915 08/24/16 1418 08/24/16 1754 08/24/16 2217 08/25/16 0745  GLUCAP 164* 220* 150* 186* 128*     Radiology Studies: Mr Foot Right W Wo Contrast  Result Date: 08/23/2016 CLINICAL DATA:  Diabetic foot ulcer. Pain and swelling along the fifth toe. Abnormal x-rays. EXAM: MRI OF  THE RIGHT FOREFOOT WITHOUT AND WITH CONTRAST TECHNIQUE: Multiplanar, multisequence MR imaging of the right foot was performed before and after the administration of intravenous contrast. CONTRAST:  50m MULTIHANCE GADOBENATE DIMEGLUMINE 529 MG/ML IV SOLN COMPARISON:  Radiographs 08/22/2016 FINDINGS: MR findings consistent with septic arthritis involving the fifth MTP joint with osteomyelitis involving the fifth metatarsal head and fifth proximal phalanx. There is an associated large dissecting abscess surrounding the MTP joint. The dorsal component of the abscess measures approximately 13 x 13 mm. It extends down between the fourth and fifth metatarsal heads and extends out through the plantar aspect of the foot through an open wound. The Small foreign body was noted near the distal phalanx of the great toe. There is signal abnormality in this area but no abscess. Possible foreign body reaction. Diffuse and fairly marked cellulitis and myofasciitis involving the forefoot and midfoot. IMPRESSION: 1. Septic arthritis involving the fifth MTP joint with associated osteomyelitis in the fifth metatarsal head and fifth proximal phalanx. 2. Large dissecting abscess surrounding the fifth MTP joint 3. Diffuse forefoot and midfoot cellulitis and myofasciitis. Electronically Signed   By: PMarijo SanesM.D.   On: 08/23/2016 08:51     Medications:  Scheduled: . allopurinol  300 mg Oral Daily  . amitriptyline  100 mg Oral QHS  . ceFEPime (MAXIPIME) IV  1 g Intravenous Q8H  . chlorhexidine  15 mL Mouth Rinse BID  . enoxaparin (LOVENOX) injection  60 mg Subcutaneous Q24H  . gabapentin  1,200 mg Oral TID  . insulin aspart  0-20 Units Subcutaneous TID WC  . insulin aspart  0-5 Units Subcutaneous QHS  . insulin glargine  35 Units Subcutaneous BID  . mouth rinse  15 mL Mouth Rinse q12n4p  . metroNIDAZOLE  500 mg Oral Q8H  . rosuvastatin  20 mg Oral Daily  . vancomycin  1,250 mg Intravenous Q24H   Continuous: .  sodium chloride 100 mL/hr at 08/24/16 1214   PQQV:ZDGLOVFIEPPIR**OR** acetaminophen, diphenhydrAMINE, HYDROcodone-acetaminophen, magnesium citrate, methocarbamol **OR** methocarbamol (ROBAXIN)  IV, metoCLOPramide **OR** metoCLOPramide (REGLAN) injection, morphine injection, ondansetron **OR** ondansetron (ZOFRAN) IV, polyethylene glycol, sorbitol  Assessment/Plan:  Principal Problem:   [place] foot infection [place] Active Problems:   DM type 2, uncontrolled, with neuropathy (HCC)   Gout   HTN (hypertension)   AKI (acute kidney injury) ([facility]   SIRS (systemic inflammatory response syndrome) (HCC)   Leukocytosis   Acute osteomyelitis of metatarsal bone, right (HCC)    Septic arthritis with abscess and osteomyelitis involving the Right fifth MTP joint  Patient was found to have septic arthritis along with abscess and osteomyelitis involving  the right fifth MTP joint. CRP 21 and ESR 132. Patient underwent surgery 4/8. He underwent right fifth ray amputation. Wound VAC has been applied. He will need repeat surgery on Wednesday. Discussed with Dr. Erlinda Hong. Patient will need to be transferred to [facility] Monday or Tuesday. Discussed also with patient. Continue vancomycin, cefepime and Flagyl for now. WBC was elevated and appears to be improving. Blood cultures negative so far.   AKI on CKD 3 Baseline creatinine appears to be around 1.2. It was 1.64 at admission. Patient was given fluids with improvement. Continue to monitor urine output. Holding lisinopril and metformin.Avoid nephrotoxic agents including NSAIDs.  Hyperlipidemia  Continue statin   Diabetes mellitus type 2 with peripheral neuropathy  Poorly controlled with a hemoglobin A1c of 9.7 in March. Lantus dose was increased 4/7. CBGs are reasonably well controlled. Continue to monitor for now. Continue SSI.   History of gout Allopurinol  Hematuria with a known history of uric acid nephrolithiasis UA does show a hemoglobin  with numerous RBCs. Patient has a history of kidney stone and microscopic hematuria in the past. CT scan of the abdomen from 2015 did show a left kidney stone. Patient is asymptomatic at this time. He has known history of uric acid stones.   Diabetic neuropathy Continue Neurontin, Elavil  Normocytic anemia Drop in hemoglobin is due to dilution. No evidence for overt bleeding. He likely has anemia of chronic disease.  Chronic pain, narcotic dependence Patient is on hydrocodone at home. This is being continued.  Recent stressors He recently separated from his wife, which was unexpected for him. He is also taking care of his elderly mother without any help. Chaplain.   DVT Prophylaxis: Lovenox    Code Status: Full code  Family Communication: Discussed with the patient  Disposition Plan: Management as outlined above. Transfer to [facility] on Monday or Tuesday for repeat surgery on Wednesday.    LOS: 3 days   [facility] Pager [phone] 08/25/2016, 8:16 AM  If 7PM-7AM, please contact night-coverage at [email or web address], password [facility]

## 2016-08-25 NOTE — Progress Notes (Signed)
Initial Nutrition Assessment  DOCUMENTATION CODES:   Obesity unspecified  INTERVENTION:   Provide Premier Protein BID, each supplement provides 160kcal and 30g protein.  Provide Multivitamin with minerals daily RD to continue to monitor for needs  NUTRITION DIAGNOSIS:   Increased nutrient needs related to wound healing as evidenced by estimated needs.  GOAL:   Patient will meet greater than or equal to 90% of their needs  MONITOR:   PO intake, Supplement acceptance, Labs, Weight trends, Skin, I & O's  REASON FOR ASSESSMENT:   Consult Wound healing  ASSESSMENT:   [name] with a past medical history of uncontrolled type 2 diabetes (last known A1c greater than 10 per patient) complicated by neuropathy and CKD 3, HTN, HLD, gout, and chronic pain who has had issues with diabetic foot ulcer involving the right foot since March.  He was seen by an outpatient provider on 3/1 and prescribed Augmentin for cellulitis. Patient admitted that he had been neglecting his foot due to personal stressors. He recently separated from his wife and has been taking care of his sick mother. Presented with worsening swelling of the right foot. Initially went to urgent care center and was advised to come to the emergency department. Evaluation has revealed osteomyelitis and abscess. Patient underwent surgery. 4/8: s/p IRRIGATION AND DEBRIDEMENT OF RIGHT FOOT AND AMPUTATION OF RIGHT FIFTH RAY (Right)  RD attempted to see patient x 2, during first attempt patient was in the bathroom and the second, pt was working with PT. Pt has order in for transfer to [facility] for further surgery on his foot wound. Per nurse tech, pt has been eating well, finished 100% of his breakfast of omelet (ham, cheese, vegetables), toast, bacon, milk, sausage and potatoes. RD ordered Premier Protein supplements for additional calories and protein needed for wound healing.   Per chart review, pt's weight tends to  fluctuate between 263-273 lb. Since 3/6, pt lost 15 lb which is 9% wt loss x 1 month. This may be related to fluid gain (PMHx: CKD III, CHF). Unable to perform NFPE at this time.  Medications reviewed. Labs reviewed: CBGs: 128-206  Diet Order:  Diet Carb Modified Fluid consistency: Thin; Room service appropriate? Yes  Skin:  Wound (see comment) (Rt DM foot ulcer)  Last BM:  4/9  Height:   Ht Readings from Last 1 Encounters:  08/22/16 6' (1.829 m)    Weight:   Wt Readings from Last 1 Encounters:  08/22/16 261 lb 3.2 oz (118.5 kg)    Ideal Body Weight:  80.9 kg  BMI:  Body mass index is 35.43 kg/m.  Estimated Nutritional Needs:   Kcal:  2250-2450  Protein:  105-115g  Fluid:  2.2L/day  EDUCATION NEEDS:   No education needs identified at this time  Tilda Franco, MS, RD, LDN Pager: [phone] After Hours Pager: [phone]

## 2016-08-25 NOTE — Telephone Encounter (Signed)
Pt is letting sarah know that he has lost part of his foot and will be going back to surgery tomorrow and would like a call back from [employer] number [phone]

## 2016-08-25 NOTE — Evaluation (Signed)
Occupational Therapy Evaluation Patient Details Name: [name] Snow MRN: [id number] DOB: [birth year] Today's Date: 08/25/2016    History of Present Illness 51 year old male with a past medical history of uncontrolled type 2 diabetes complicated by neuropathy and CKD 3, HTN, HLD, gout, and chronic pain who has had issues with diabetic foot ulcer involving the right foot since March.  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 Pt currently with functional limitations due to the deficits listed below (see OT Problem List).  Pt will benefit from skilled OT to increase their safety and independence with ADL and functional mobility for ADL to facilitate discharge to venue listed below.      Follow Up Recommendations  No OT follow up    Equipment Recommendations  3 in 1 bedside commode       Precautions / Restrictions Precautions Precautions: Fall Precaution Comments: wound vac Restrictions Weight Bearing Restrictions: Yes RLE Weight Bearing: Non weight bearing      Mobility Bed Mobility Overal bed mobility: Needs Assistance Bed Mobility: Supine to Sit     Supine to sit: HOB elevated;Supervision     General bed mobility comments: supervision for managing lines  Transfers Overall transfer level: Needs assistance Equipment used: Rolling walker (2 wheeled) Transfers: Sit to/from Stand Sit to Stand: Min assist         General transfer comment: verbal cues for hand placement and maintaining NWB        ADL either performed or assessed with clinical judgement   ADL Overall ADL's : Needs assistance/impaired Eating/Feeding: Set up;Sitting   Grooming: Set up;Sitting   Upper Body Bathing: Set up;Sitting   Lower Body Bathing: Moderate assistance;Sit to/from stand;Cueing for compensatory  techniques;Cueing for safety   Upper Body Dressing : Sitting;Set up   Lower Body Dressing: Moderate assistance;Sit to/from stand;Cueing for compensatory techniques;Cueing for safety   Toilet Transfer: Minimal assistance;RW;Ambulation Toilet Transfer Details (indicate cue type and reason): pt overall S- min A. Pt has wound vac and IV which as well as walker so he needs A. Pt wants to be I to go to bathroom - explained reason why pt cant go on his own Toileting- Architect and Hygiene: Minimal assistance;Sit to/from stand;Cueing for safety;Cueing for compensatory techniques               Vision Patient Visual Report: No change from baseline       Perception     Praxis      Pertinent Vitals/Pain Pain Assessment: No/denies pain        Extremity/Trunk Assessment Upper Extremity Assessment Upper Extremity Assessment: Overall WFL for tasks assessed           Communication Communication Communication: No difficulties   Cognition Arousal/Alertness: Awake/alert Behavior During Therapy: WFL for tasks assessed/performed Overall Cognitive Status: Within Functional Limits for tasks assessed                                                Home Living Family/patient expects to be discharged to:: Private residence Living Arrangements: Parent   Type of Home: House  Home Equipment: Crutches   Additional Comments: pt reports his home is multiple level and he would need to perform at least a flight to be able to stay on one level once inside, considering staying with his wife (separated currently) who only has 3-4 steps to get into home      Prior Functioning/Environment Level of Independence: Independent                 OT Problem List: Decreased strength;Decreased knowledge of use of DME or AE;Decreased safety awareness;Decreased knowledge of precautions      OT Treatment/Interventions: Self-care/ADL  training;DME and/or AE instruction;Patient/family education    OT Goals(Current goals can be found in the care plan section) Acute Rehab OT Goals Patient Stated Goal: ambulate again, pt appears concerned with care of his mother (has dementia) while he is recovering OT Goal Formulation: With patient Time For Goal Achievement: 09/08/16 Potential to Achieve Goals: Good  OT Frequency: Min 2X/week              End of Session Equipment Utilized During Treatment: Rolling walker Nurse Communication: Mobility status;Precautions;Weight bearing status  Activity Tolerance: Patient tolerated treatment well Patient left: Other (comment) (in bathroom- CNA aware)  OT Visit Diagnosis: Unsteadiness on feet (R26.81);Other abnormalities of gait and mobility (R26.89)                Time: 1478-2956 OT Time Calculation (min): 19 min Charges:  OT General Charges $OT Visit: 1 Procedure OT Evaluation $OT Eval Moderate Complexity: 1 Procedure G-Codes:     Lise Auer, OT [phone]  Einar Crow D 08/25/2016, 10:51 AM

## 2016-08-25 NOTE — Clinical Social Work Note (Signed)
Clinical Social Work Assessment  Patient Details  Name: [name] Snow MRN: [id number] Date of Birth: 04-02-1966  Date of referral:  08/25/16               Reason for consult:  Facility Placement                Permission sought to share information with:  Oceanographer granted to share information::  Yes, Verbal Permission Granted  Name::        Agency::     Relationship::     Contact Information:     Housing/Transportation Living arrangements for the past 2 months:  Single Family Home Source of Information:  Patient Patient Interpreter Needed:  None Criminal Activity/Legal Involvement Pertinent to Current Situation/Hospitalization:    Significant Relationships:  Spouse Lives with:  Self, Spouse Do you feel safe going back to the place where you live?  Yes Need for family participation in patient care:  Yes (Comment)  Care giving concerns:  Patient unsure of needs at home/ discharge plans.    Social Worker assessment / plan:  CSW spoke with patient via bedside to discuss discharge plans. PT is currently recommending "home health PT(patient is refusing SNF)". Patient is transferring to [facility] once there PT will see patient again. Patient recently separated from spouse however she has agreed to help patient as much as possible. Patient lives in a three story house and spouse has one story house. Patient states he is hopeful spouse will let him live with her until he is back on his feet. Patient is agreeable to fax information out to SNF (if PT changes recommendation).   Plan: Follow up with PT recommendation once moved to [facility].   Employment status:    Insurance informationAdvertising account executive PT Recommendations:  Home with Home Health Information / Referral to community resources:     Patient/Family's Response to care: patient appreciated CSW.   Patient/Family's Understanding of and Emotional Response to Diagnosis, Current Treatment, and Prognosis:   Patient understands current treatment and prognosis.   Emotional Assessment Appearance:  Appears stated age Attitude/Demeanor/Rapport:    Affect (typically observed):  Accepting, Calm, Pleasant Orientation:  Oriented to Self, Oriented to Place, Oriented to  Time, Oriented to Situation Alcohol / Substance use:    Psych involvement (Current and /or in the community):  No (Comment)  Discharge Needs  Concerns to be addressed:  No discharge needs identified Readmission within the last 30 days:  No Current discharge risk:  None Barriers to Discharge:  No Barriers Identified   Donnie Coffin, LCSW 08/25/2016, 12:27 PM

## 2016-08-25 NOTE — Progress Notes (Signed)
Inpatient Diabetes Program Recommendations  AACE/ADA: New Consensus Statement on Inpatient Glycemic Control (2015)  Target Ranges:  Prepandial:   less than 140 mg/dL      Peak postprandial:   less than 180 mg/dL (1-2 hours)      Critically ill patients:  140 - 180 mg/dL   Lab Results  Component Value Date   GLUCAP 206 (H) 08/25/2016   HGBA1C 9.3 (H) 08/22/2016    Review of Glycemic Control  Diabetes history: DM2 Outpatient Diabetes medications: Trulicity 1.5 mg once/week, Lantus 30 units bid, metformin 1000 mg bid Current orders for Inpatient glycemic control: Lantus 35 units bid, Novolog 0-20 units tidwc and hs  Pt states he's having a hard time, very depressed with news about his foot. States he might need to have another amputation of toe.  Will transfer to [facility] for repeat surgery on 4/11. HgbA1C - 9.3% - poor glycemic control. Discussed with pt. Pt states he had not started the Trulicity that was prescribed in March by his PCP. Wife took his Lantus out of refrigerator and gave to his son, who also has diabetes. Will need affordable insulins at discharge. Pt extremely depressed and needs lots of support.  Inpatient Diabetes Program Recommendations:    Add Novolog 4 units tidwc for meal coverage insulin (Eating > 50% meal)  Continue to follow closely.  Thank you. Ailene Ards, RD, LDN, CDE Inpatient Diabetes Coordinator [phone]

## 2016-08-26 ENCOUNTER — Ambulatory Visit: Payer: PPO | Admitting: Physician Assistant

## 2016-08-26 LAB — CBC
HEMATOCRIT: 30.2 % — AB (ref 39.0–52.0)
Hemoglobin: 9.8 g/dL — ABNORMAL LOW (ref 13.0–17.0)
MCH: 27.5 pg (ref 26.0–34.0)
MCHC: 32.5 g/dL (ref 30.0–36.0)
MCV: 84.8 fL (ref 78.0–100.0)
Platelets: 334 10*3/uL (ref 150–400)
RBC: 3.56 MIL/uL — ABNORMAL LOW (ref 4.22–5.81)
RDW: 12.9 % (ref 11.5–15.5)
WBC: 12 10*3/uL — ABNORMAL HIGH (ref 4.0–10.5)

## 2016-08-26 LAB — BASIC METABOLIC PANEL
Anion gap: 10 (ref 5–15)
BUN: 30 mg/dL — AB (ref 6–20)
CO2: 24 mmol/L (ref 22–32)
Calcium: 9 mg/dL (ref 8.9–10.3)
Chloride: 103 mmol/L (ref 101–111)
Creatinine, Ser: 1.34 mg/dL — ABNORMAL HIGH (ref 0.61–1.24)
GFR calc Af Amer: >60 mL/min (ref >60)
GFR, EST NON AFRICAN AMERICAN: 59 mL/min — AB (ref >60)
GLUCOSE: 210 mg/dL — AB (ref 65–99)
POTASSIUM: 4.4 mmol/L (ref 3.5–5.1)
Sodium: 137 mmol/L (ref 135–145)

## 2016-08-26 LAB — GLUCOSE, CAPILLARY
GLUCOSE-CAPILLARY: 255 mg/dL — AB (ref 65–99)
GLUCOSE-CAPILLARY: 163 mg/dL — AB (ref 65–99)
GLUCOSE-CAPILLARY: 218 mg/dL — AB (ref 65–99)
Glucose-Capillary: 191 mg/dL — ABNORMAL HIGH (ref 65–99)

## 2016-08-26 LAB — VANCOMYCIN, TROUGH: Vancomycin Tr: 9 ug/mL — ABNORMAL LOW (ref 15–20)

## 2016-08-26 MED ORDER — VANCOMYCIN HCL IN DEXTROSE 1-5 GM/200ML-% IV SOLN
1000.0000 mg | Freq: Two times a day (BID) | INTRAVENOUS | Status: DC
  Administered 2016-08-26 – 2016-08-29 (×6): 1000 mg via INTRAVENOUS
  Filled 2016-08-26 (×7): qty 200

## 2016-08-26 NOTE — Progress Notes (Signed)
Pharmacy Antibiotic Note  [name] Snow is a 51 y.o. male admitted on 08/22/2016 with Osteomyelitis.  Pharmacy has been consulted for Vancomycin dosing.  Plan: Vancomycin 1gm  IV every 12 hours.  Goal trough 15-20 mcg/mL.  Height: 6' (182.9 cm) Weight: 261 lb 3.2 oz (118.5 kg) IBW/kg (Calculated) : 77.6  Temp (24hrs), Avg:99 F (37.2 C), Min:98.5 F (36.9 C), Max:100 F (37.8 C)   Recent Labs Lab 08/22/16 1838 08/23/16 0413 08/24/16 0415 08/25/16 0439 08/26/16 0510  WBC 18.4* 13.8* 12.9* 10.1 12.0*  CREATININE 1.64* 1.37* 1.43* 1.21 1.34*  VANCOTROUGH  --   --   --   --  9*    Estimated Creatinine Clearance: 85.7 mL/min (A) (by C-G formula based on SCr of 1.34 mg/dL (H)).    No Known Allergies  Antimicrobials this admission: 4/6 >> vancomycin >> 4/6 >> cefepime >> 4/6 << metronidazole >>  Dose adjustments this admission: VT 0510 4/10 = 9; Vanco change to 1gm iv q12hr, VT--Drawn a little early and 85ml extra dose on 4/8.     Microbiology results: 4/6 BCx: ngtd 4/7 MRSA PCR: neg   Thank you for allowing pharmacy to be a part of this patient's care.  Aleene Davidson Crowford 08/26/2016 6:24 AM

## 2016-08-26 NOTE — Consult Note (Signed)
   [facility] CM Inpatient Consult   08/26/2016  COSTON MANDATO [birth year] [id number]     Patient screened for [facility] Care Management services. Went to bedside to offer and explain [facility] Care Management program with Mr. Vreeland. He states he is familiar with the program and he does not think he needs [facility] Care Management services at this time. States "I have recently neglected my health because I have had so much going on with a recent separation and other things". Mr. Moore declined [facility] Care Management follow up. Accepted [facility] Care Management brochure with contact information and 24-hr nurse line magnet to contact if needed. Will make inpatient RNCM aware that patient declined [facility] Care Management program services.  He is currently awaiting to be transferred to [facility] for surgery.   Raiford Noble, MSN-Ed, RN,BSN [facility] Liaison [phone]

## 2016-08-26 NOTE — Progress Notes (Signed)
Made request to [facility] to transport patient to [facility] Short Stay tomorrow  by 11 am. Also spoke to Eunice Blase, nurse at Short Stay to confirm time and place. Patient notified re: this matter, he signed consent. Will endorsed to night nurse.

## 2016-08-26 NOTE — Progress Notes (Signed)
Inpatient Diabetes Program Recommendations  AACE/ADA: New Consensus Statement on Inpatient Glycemic Control (2015)  Target Ranges:  Prepandial:   less than 140 mg/dL      Peak postprandial:   less than 180 mg/dL (1-2 hours)      Critically ill patients:  140 - 180 mg/dL   Results for CHIN, WACHTER (MRN [id number]) as of 08/26/2016 13:27  Ref. Range 08/25/2016 07:45 08/25/2016 12:10 08/25/2016 17:11 08/25/2016 22:13  Glucose-Capillary Latest Ref Range: 65 - 99 mg/dL 409 (H) 811 (H) 914 (H) 188 (H)   Results for ELIAZ, FOUT (MRN [id number]) as of 08/26/2016 13:27  Ref. Range 08/26/2016 08:30 08/26/2016 12:20  Glucose-Capillary Latest Ref Range: 65 - 99 mg/dL 086 (H) 578 (H)    Home DM Meds: Lantus 30 units BID       Metformin 1000 mg BID (not taking per Home Med Rec)       Trulicity 1.5 mg Qweek  Current Insulin Orders: Lantus 35 units BID      Novolog Resistant Correction Scale/ SSI (0-20 units) TID AC + HS      MD- Note patient will be NPO tomorrow for surgery.  CBG trends have shown that patient having postprandial elevations.  Please consider starting Novolog Meal Coverage: Novolog 4 units TID with meals (hold if pt eats <50% of meal)      --Will follow patient during hospitalization--  Ambrose Finland RN, MSN, CDE Diabetes Coordinator Inpatient Glycemic Control Team Team Pager: [phone] (8a-5p)

## 2016-08-26 NOTE — H&P (Signed)
H&P update  The surgical history has been reviewed and remains accurate without interval change.  The patient was re-examined and patient's physiologic condition has not changed significantly in the last 30 days. The condition still exists that makes this procedure necessary. The treatment plan remains the same, without new options for care.  No new pharmacological allergies or types of therapy has been initiated that would change the plan or the appropriateness of the plan.  The patient and/or family understand the potential benefits and risks.  Mayra Reel, MD 08/26/2016 10:31 PM

## 2016-08-26 NOTE — Progress Notes (Signed)
[facility] PROGRESS NOTE  [name] Snow ZHY:[id number] DOB: [birth year] DOA: 08/22/2016  PCP: [name] Snow  Brief History/Interval Summary: [name] with a past medical history of uncontrolled type 2 diabetes (last known A1c greater than 10 per patient) complicated by neuropathy and CKD 3, HTN, HLD, gout, and chronic pain who has had issues with diabetic foot ulcer involving the right foot since March.  He was seen by an outpatient provider on 3/1 and prescribed Augmentin for cellulitis. Patient admitted that he had been neglecting his foot due to personal stressors. He recently separated from his wife and has been taking care of his sick mother. Presented with worsening swelling of the right foot. Initially went to urgent care center and was advised to come to the emergency department. Evaluation has revealed osteomyelitis and abscess. Patient underwent surgery.  Reason for Visit: Right fifth toe osteomyelitis with abscess  Consultants: Orthopedics: Dr. Erlinda Snow  Procedures:  On 4/8:  1. Irrigation and debridement of bone, muscle, subcutaneous tissue, tendon, skin of right foot 8 x 6 cm 2. Right foot fifth ray amputation 3. Application of wound VAC less than 50 cm  Antibiotics: Vancomycin and cefepime plus oral Flagyl  Subjective/Interval History: Patient did experience some neuropathic pain overnight. Overall, he seems to be feeling better. Denies any nausea or vomiting.   ROS: No chest pain or shortness of breath.  Objective:  Vital Signs  Vitals:   08/25/16 0549 08/25/16 1247 08/25/16 2215 08/26/16 0500  BP: 129/64 (!) 157/76 (!) 159/71 127/73  Pulse: 65 70 70 67  Resp: '18 18 16 16  '$ Temp: 98 F (36.7 C) 98.5 F (36.9 C) 100 F (37.8 C) 98.5 F (36.9 C)  TempSrc: Oral Oral Oral Oral  SpO2: 97% 97% 97% 99%  Weight:      Height:        Intake/Output Summary (Last 24 hours) at 08/26/16 0745 Last data filed at 08/26/16 2952  Gross per 24 hour   Intake          2756.25 ml  Output             5750 ml  Net         -2993.75 ml   Filed Weights   08/22/16 1639 08/22/16 2141  Weight: 116.1 kg (256 lb) 118.5 kg (261 lb 3.2 oz)    General appearance: alert, cooperative, appears stated age and no distress Resp: clear to auscultation bilaterally Cardio: regular rate and rhythm, S1, S2 normal, no murmur, click, rub or gallop GI: soft, non-tender; bowel sounds normal; no masses,  no organomegaly Extremities: Wound VAC noted over the right foot. Erythema noted over the dorsal aspect of the foot. Slightly warm to touch. Neurologic: Awake and alert. Oriented 3. No focal neurological deficits are noted.  Lab Results:  Data Reviewed: I have personally reviewed following labs and imaging studies  CBC:  Recent Labs Lab 08/22/16 1838 08/23/16 0413 08/24/16 0415 08/25/16 0439 08/26/16 0510  WBC 18.4* 13.8* 12.9* 10.1 12.0*  NEUTROABS 14.3*  --   --   --   --   HGB 9.9* 9.6* 9.8* 8.9* 9.8*  HCT 30.5* 30.1* 31.0* 27.5* 30.2*  MCV 87.4 88.0 85.4 84.9 84.8  PLT 333 326 324 278 841    Basic Metabolic Panel:  Recent Labs Lab 08/22/16 1838 08/23/16 0413 08/24/16 0415 08/25/16 0439 08/26/16 0510  NA 134* 137 137 138 137  K 4.2 4.2 4.1 4.2 4.4  CL 101 104 107 107  103  CO2 '25 25 25 26 24  '$ GLUCOSE 255* 254* 160* 148* 210*  BUN 44* 23* 21* 26* 30*  CREATININE 1.64* 1.37* 1.43* 1.21 1.34*  CALCIUM 8.7* 8.7* 8.4* 8.4* 9.0    GFR: Estimated Creatinine Clearance: 85.7 mL/min (A) (by C-G formula based on SCr of 1.34 mg/dL (H)).  Liver Function Tests:  Recent Labs Lab 08/22/16 2235 08/24/16 0415  AST 14* 13*  ALT 12* 9*  ALKPHOS 87 73  BILITOT 0.8 0.7  PROT 8.6* 7.7  ALBUMIN 3.3* 3.0*    Coagulation Profile:  Recent Labs Lab 08/22/16 2235  INR 0.99    CBG:  Recent Labs Lab 08/24/16 2217 08/25/16 0745 08/25/16 1210 08/25/16 1711 08/25/16 2213  GLUCAP 186* 128* 206* 217* 188*     Radiology Studies: No  results found.   Medications:  Scheduled: . allopurinol  300 mg Oral Daily  . amitriptyline  100 mg Oral QHS  . ceFEPime (MAXIPIME) IV  1 g Intravenous Q8H  . chlorhexidine  15 mL Mouth Rinse BID  . gabapentin  1,200 mg Oral TID  . insulin aspart  0-20 Units Subcutaneous TID WC  . insulin aspart  0-5 Units Subcutaneous QHS  . insulin glargine  35 Units Subcutaneous BID  . mouth rinse  15 mL Mouth Rinse q12n4p  . metroNIDAZOLE  500 mg Oral Q8H  . multivitamin with minerals  1 tablet Oral Daily  . protein supplement shake  11 oz Oral BID BM  . rosuvastatin  20 mg Oral Daily  . vancomycin  1,000 mg Intravenous Q12H   Continuous: . sodium chloride 75 mL/hr at 08/26/16 0123   HAL:[id number] **OR** acetaminophen, diphenhydrAMINE, HYDROcodone-acetaminophen, magnesium citrate, methocarbamol **OR** methocarbamol (ROBAXIN)  IV, metoCLOPramide **OR** metoCLOPramide (REGLAN) injection, morphine injection, ondansetron **OR** ondansetron (ZOFRAN) IV, polyethylene glycol, sorbitol  Assessment/Plan:  Principal Problem:   Diabetic foot infection ([place]) Active Problems:   DM type 2, uncontrolled, with neuropathy (HCC)   Gout   HTN (hypertension)   AKI (acute kidney injury) ([place])   SIRS (systemic inflammatory response syndrome) (HCC)   Leukocytosis   Acute osteomyelitis of metatarsal bone, right (HCC)    Septic arthritis with abscess and osteomyelitis involving the Right fifth MTP joint  Patient was found to have septic arthritis along with abscess and osteomyelitis involving the right fifth MTP joint. CRP 21 and ESR 132. Patient underwent surgery 4/8. He underwent right fifth ray amputation. Wound VAC has been applied. He will need repeat surgery on Wednesday. Discussed with Dr. Erlinda Snow. Patient will need to be transferred to [facility]. Continue vancomycin, cefepime and Flagyl for now. WBC was elevated and appears to be improving. Blood cultures negative so far. Anticipate change to  oral antibiotics postoperatively.  AKI on CKD 3 Baseline creatinine appears to be around 1.2. It was 1.64 at admission. Patient was given fluids with improvement. Continue to monitor urine output. Holding lisinopril and metformin.Avoid nephrotoxic agents including NSAIDs.  Hyperlipidemia  Continue statin   Diabetes mellitus type 2 with peripheral neuropathy  Poorly controlled with a hemoglobin A1c of 9.7 in March. Lantus dose was increased 4/7. CBGs are reasonably well controlled. Continue to monitor for now. Continue SSI.   History of gout Allopurinol  Hematuria with a known history of uric acid nephrolithiasis UA does show a hemoglobin with numerous RBCs. Patient has a history of kidney stone and microscopic hematuria in the past. CT scan of the abdomen from 2015 did show a left kidney stone. Patient is  asymptomatic at this time. He has known history of uric acid stones.   Diabetic neuropathy Continue Neurontin, Elavil  Normocytic anemia Drop in hemoglobin is due to dilution. No evidence for overt bleeding. He likely has anemia of chronic disease.  Chronic pain, narcotic dependence Patient is on hydrocodone at home. This is being continued.  Recent stressors He recently separated from his wife, which was unexpected for him. He is also taking care of his elderly mother without any help. Provide emotional support as needed.  DVT Prophylaxis: Lovenox    Code Status: Full code  Family Communication: Discussed with the patient  Disposition Plan: Management as outlined above. Await transfer to [facility] for repeat surgery to right foot on Wednesday.    LOS: 4 days   [facility] Pager [phone] 08/26/2016, 7:45 AM  If 7PM-7AM, please contact night-coverage at [email or web address], password [facility]

## 2016-08-26 NOTE — Care Management Important Message (Signed)
Important Message  Patient Details  Name: [name] Snow MRN: [id number] Date of Birth: [birth year]   Medicare Important Message Given:  Yes    Caren Macadam 08/26/2016, 11:02 AMImportant Message  Patient Details  Name: [name] Snow MRN: [id number] Date of Birth: Apr 16, 1966   Medicare Important Message Given:  Yes    Caren Macadam 08/26/2016, 11:02 AM

## 2016-08-26 NOTE — Progress Notes (Signed)
PT Cancellation Note  Patient Details Name: [name] Snow MRN: [id number] DOB: 05-10-1966   Cancelled Treatment:     per chart review, pt is transferring to [facility] for surgery Wed.  Pt has been evaluated and since he declines SNF rec, plan is for D/C to home with [facility].   Also needs RW and 3:1   Felecia Shelling  PTA [facility]  Acute  Rehab Pager      [phone]

## 2016-08-27 ENCOUNTER — Inpatient Hospital Stay (HOSPITAL_COMMUNITY): Payer: PPO | Admitting: Certified Registered Nurse Anesthetist

## 2016-08-27 ENCOUNTER — Encounter (HOSPITAL_COMMUNITY)
Admission: EM | Disposition: A | Discharge status: Home Health | Payer: Self-pay | Source: Home / Self Care | Attending: Internal Medicine

## 2016-08-27 ENCOUNTER — Encounter (HOSPITAL_COMMUNITY): Payer: Self-pay | Admitting: Certified Registered Nurse Anesthetist

## 2016-08-27 DIAGNOSIS — M86171 Other acute osteomyelitis, right ankle and foot: Secondary | ICD-10-CM

## 2016-08-27 HISTORY — PX: I & D EXTREMITY: SHX5045

## 2016-08-27 LAB — CBC
HEMATOCRIT: 34.1 % — AB (ref 39.0–52.0)
Hemoglobin: 11 g/dL — ABNORMAL LOW (ref 13.0–17.0)
MCH: 27.9 pg (ref 26.0–34.0)
MCHC: 32.3 g/dL (ref 30.0–36.0)
MCV: 86.5 fL (ref 78.0–100.0)
PLATELETS: 413 10*3/uL — AB (ref 150–400)
RBC: 3.94 MIL/uL — ABNORMAL LOW (ref 4.22–5.81)
RDW: 13.3 % (ref 11.5–15.5)
WBC: 12.6 10*3/uL — ABNORMAL HIGH (ref 4.0–10.5)

## 2016-08-27 LAB — CREATININE, SERUM: CREATININE: 1.21 mg/dL (ref 0.61–1.24)

## 2016-08-27 LAB — GLUCOSE, CAPILLARY
GLUCOSE-CAPILLARY: 133 mg/dL — AB (ref 65–99)
GLUCOSE-CAPILLARY: 139 mg/dL — AB (ref 65–99)
GLUCOSE-CAPILLARY: 232 mg/dL — AB (ref 65–99)
Glucose-Capillary: 148 mg/dL — ABNORMAL HIGH (ref 65–99)
Glucose-Capillary: 148 mg/dL — ABNORMAL HIGH (ref 65–99)
Glucose-Capillary: 144 mg/dL — ABNORMAL HIGH (ref 65–99)
Glucose-Capillary: 112 mg/dL — ABNORMAL HIGH (ref 65–99)

## 2016-08-27 SURGERY — IRRIGATION AND DEBRIDEMENT EXTREMITY
Anesthesia: General | Site: Foot | Laterality: Right

## 2016-08-27 MED ORDER — 0.9 % SODIUM CHLORIDE (POUR BTL) OPTIME
TOPICAL | Status: DC | PRN
  Administered 2016-08-27: 1000 mL

## 2016-08-27 MED ORDER — LIDOCAINE 2% (20 MG/ML) 5 ML SYRINGE
INTRAMUSCULAR | Status: DC | PRN
  Administered 2016-08-27: 100 mg via INTRAVENOUS

## 2016-08-27 MED ORDER — MEPERIDINE HCL 25 MG/ML IJ SOLN: 6.2500 mg | INTRAMUSCULAR | Status: DC | PRN

## 2016-08-27 MED ORDER — PHENYLEPHRINE 40 MCG/ML (10ML) SYRINGE FOR IV PUSH (FOR BLOOD PRESSURE SUPPORT)
PREFILLED_SYRINGE | INTRAVENOUS | Status: AC
  Filled 2016-08-27: qty 40

## 2016-08-27 MED ORDER — EPHEDRINE SULFATE-NACL 50-0.9 MG/10ML-% IV SOSY
PREFILLED_SYRINGE | INTRAVENOUS | Status: DC | PRN
  Administered 2016-08-27: 5 mg via INTRAVENOUS

## 2016-08-27 MED ORDER — SODIUM CHLORIDE 0.9 % IR SOLN
Status: DC | PRN
  Administered 2016-08-27: 6000 mL

## 2016-08-27 MED ORDER — OXYCODONE HCL 5 MG PO TABS: 5.0000 mg | ORAL_TABLET | Freq: Once | ORAL | Status: DC | PRN

## 2016-08-27 MED ORDER — FENTANYL CITRATE (PF) 100 MCG/2ML IJ SOLN
INTRAMUSCULAR | Status: DC | PRN
  Administered 2016-08-27 (×2): 50 ug via INTRAVENOUS

## 2016-08-27 MED ORDER — METOCLOPRAMIDE HCL 5 MG/ML IJ SOLN: 5.0000 mg | Freq: Three times a day (TID) | INTRAMUSCULAR | Status: DC | PRN

## 2016-08-27 MED ORDER — EPHEDRINE 5 MG/ML INJ
INTRAVENOUS | Status: AC
  Filled 2016-08-27: qty 10

## 2016-08-27 MED ORDER — FENTANYL CITRATE (PF) 100 MCG/2ML IJ SOLN: 25.0000 ug | INTRAMUSCULAR | Status: DC | PRN

## 2016-08-27 MED ORDER — FENTANYL CITRATE (PF) 250 MCG/5ML IJ SOLN
INTRAMUSCULAR | Status: AC
  Filled 2016-08-27: qty 5

## 2016-08-27 MED ORDER — DEXTROSE 5 % IV SOLN
INTRAVENOUS | Status: AC
  Administered 2016-08-27: 08:00:00 via INTRAVENOUS

## 2016-08-27 MED ORDER — ENOXAPARIN SODIUM 40 MG/0.4ML ~~LOC~~ SOLN
40.0000 mg | SUBCUTANEOUS | Status: DC
  Administered 2016-08-28: 40 mg via SUBCUTANEOUS
  Filled 2016-08-27: qty 0.4

## 2016-08-27 MED ORDER — SODIUM CHLORIDE 0.9 % IV SOLN
INTRAVENOUS | Status: DC
  Administered 2016-08-27 – 2016-08-30 (×5): via INTRAVENOUS

## 2016-08-27 MED ORDER — ONDANSETRON HCL 4 MG/2ML IJ SOLN: 4.0000 mg | Freq: Four times a day (QID) | INTRAMUSCULAR | Status: DC | PRN

## 2016-08-27 MED ORDER — CEFAZOLIN SODIUM-DEXTROSE 2-4 GM/100ML-% IV SOLN: 2.0000 g | Freq: Four times a day (QID) | INTRAVENOUS | Status: DC

## 2016-08-27 MED ORDER — INSULIN GLARGINE 100 UNIT/ML ~~LOC~~ SOLN
35.0000 [IU] | Freq: Two times a day (BID) | SUBCUTANEOUS | Status: AC
  Administered 2016-08-27 – 2016-08-28 (×3): 35 [IU] via SUBCUTANEOUS
  Filled 2016-08-27 (×3): qty 0.35

## 2016-08-27 MED ORDER — ONDANSETRON HCL 4 MG/2ML IJ SOLN
INTRAMUSCULAR | Status: DC | PRN
  Administered 2016-08-27: 4 mg via INTRAVENOUS

## 2016-08-27 MED ORDER — DIPHENHYDRAMINE HCL 12.5 MG/5ML PO ELIX: 25.0000 mg | ORAL_SOLUTION | ORAL | Status: DC | PRN

## 2016-08-27 MED ORDER — LACTATED RINGERS IV SOLN
INTRAVENOUS | Status: DC
  Administered 2016-08-27: 14:00:00 via INTRAVENOUS

## 2016-08-27 MED ORDER — MORPHINE SULFATE (PF) 2 MG/ML IV SOLN
1.0000 mg | INTRAVENOUS | Status: DC | PRN
  Administered 2016-08-27: 1 mg via INTRAVENOUS
  Filled 2016-08-27 (×2): qty 1

## 2016-08-27 MED ORDER — MORPHINE SULFATE (PF) 2 MG/ML IV SOLN: 1.0000 mg | INTRAVENOUS | Status: DC | PRN

## 2016-08-27 MED ORDER — OXYCODONE HCL 5 MG/5ML PO SOLN: 5.0000 mg | Freq: Once | ORAL | Status: DC | PRN

## 2016-08-27 MED ORDER — ONDANSETRON HCL 4 MG/2ML IJ SOLN: 4.0000 mg | Freq: Once | INTRAMUSCULAR | Status: DC | PRN

## 2016-08-27 MED ORDER — METHOCARBAMOL 1000 MG/10ML IJ SOLN
500.0000 mg | Freq: Four times a day (QID) | INTRAVENOUS | Status: DC | PRN
  Filled 2016-08-27: qty 5

## 2016-08-27 MED ORDER — ACETAMINOPHEN 650 MG RE SUPP: 650.0000 mg | Freq: Four times a day (QID) | RECTAL | Status: DC | PRN

## 2016-08-27 MED ORDER — SORBITOL 70 % SOLN: 30.0000 mL | Freq: Every day | Status: DC | PRN

## 2016-08-27 MED ORDER — METHOCARBAMOL 500 MG PO TABS: 500.0000 mg | ORAL_TABLET | Freq: Four times a day (QID) | ORAL | Status: DC | PRN

## 2016-08-27 MED ORDER — MAGNESIUM CITRATE PO SOLN: 1.0000 | Freq: Once | ORAL | Status: DC | PRN

## 2016-08-27 MED ORDER — ACETAMINOPHEN 325 MG PO TABS
650.0000 mg | ORAL_TABLET | Freq: Four times a day (QID) | ORAL | Status: DC | PRN
  Administered 2016-08-28 – 2016-08-29 (×3): 650 mg via ORAL
  Filled 2016-08-27 (×4): qty 2

## 2016-08-27 MED ORDER — ACETAMINOPHEN 325 MG PO TABS: 325.0000 mg | ORAL_TABLET | ORAL | Status: DC | PRN

## 2016-08-27 MED ORDER — INSULIN ASPART 100 UNIT/ML ~~LOC~~ SOLN
0.0000 [IU] | SUBCUTANEOUS | Status: DC
  Administered 2016-08-27: 1 [IU] via SUBCUTANEOUS
  Administered 2016-08-28: 3 [IU] via SUBCUTANEOUS
  Administered 2016-08-28 (×3): 2 [IU] via SUBCUTANEOUS
  Administered 2016-08-28: 1 [IU] via SUBCUTANEOUS
  Administered 2016-08-28: 3 [IU] via SUBCUTANEOUS
  Administered 2016-08-29: 2 [IU] via SUBCUTANEOUS
  Administered 2016-08-29 (×4): 1 [IU] via SUBCUTANEOUS
  Administered 2016-08-29: 2 [IU] via SUBCUTANEOUS
  Administered 2016-08-30: 1 [IU] via SUBCUTANEOUS
  Administered 2016-08-30 (×4): 2 [IU] via SUBCUTANEOUS

## 2016-08-27 MED ORDER — ACETAMINOPHEN 160 MG/5ML PO SOLN: 325.0000 mg | ORAL | Status: DC | PRN

## 2016-08-27 MED ORDER — PROPOFOL 10 MG/ML IV BOLUS
INTRAVENOUS | Status: AC
  Filled 2016-08-27: qty 20

## 2016-08-27 MED ORDER — PHENYLEPHRINE 40 MCG/ML (10ML) SYRINGE FOR IV PUSH (FOR BLOOD PRESSURE SUPPORT)
PREFILLED_SYRINGE | INTRAVENOUS | Status: DC | PRN
  Administered 2016-08-27: 120 ug via INTRAVENOUS
  Administered 2016-08-27: 80 ug via INTRAVENOUS
  Administered 2016-08-27: 120 ug via INTRAVENOUS
  Administered 2016-08-27: 80 ug via INTRAVENOUS

## 2016-08-27 MED ORDER — MIDAZOLAM HCL 5 MG/5ML IJ SOLN
INTRAMUSCULAR | Status: DC | PRN
  Administered 2016-08-27: 2 mg via INTRAVENOUS

## 2016-08-27 MED ORDER — MIDAZOLAM HCL 2 MG/2ML IJ SOLN
INTRAMUSCULAR | Status: AC
  Filled 2016-08-27: qty 2

## 2016-08-27 MED ORDER — METOCLOPRAMIDE HCL 5 MG PO TABS: 5.0000 mg | ORAL_TABLET | Freq: Three times a day (TID) | ORAL | Status: DC | PRN

## 2016-08-27 MED ORDER — PROPOFOL 10 MG/ML IV BOLUS
INTRAVENOUS | Status: DC | PRN
  Administered 2016-08-27: 200 mg via INTRAVENOUS

## 2016-08-27 MED ORDER — OXYCODONE HCL 5 MG PO TABS
5.0000 mg | ORAL_TABLET | ORAL | Status: DC | PRN
  Administered 2016-08-27 – 2016-08-31 (×22): 15 mg via ORAL
  Filled 2016-08-27 (×22): qty 3

## 2016-08-27 MED ORDER — ONDANSETRON HCL 4 MG PO TABS: 4.0000 mg | ORAL_TABLET | Freq: Four times a day (QID) | ORAL | Status: DC | PRN

## 2016-08-27 MED ORDER — LACTATED RINGERS IV SOLN
INTRAVENOUS | Status: DC | PRN
  Administered 2016-08-27: 14:00:00 via INTRAVENOUS

## 2016-08-27 MED ORDER — POLYETHYLENE GLYCOL 3350 17 G PO PACK: 17.0000 g | PACK | Freq: Every day | ORAL | Status: DC | PRN

## 2016-08-27 SURGICAL SUPPLY — 70 items
BANDAGE ELASTIC 3 VELCRO ST LF (GAUZE/BANDAGES/DRESSINGS) IMPLANT
BANDAGE ELASTIC 4 VELCRO ST LF (GAUZE/BANDAGES/DRESSINGS) ×2 IMPLANT
BANDAGE GAUZE 4  KLING STR (GAUZE/BANDAGES/DRESSINGS) ×3 IMPLANT
BLADE AVERAGE 25X9 (BLADE) ×3 IMPLANT
BNDG COHESIVE 1X5 TAN STRL LF (GAUZE/BANDAGES/DRESSINGS) IMPLANT
BNDG COHESIVE 4X5 TAN STRL (GAUZE/BANDAGES/DRESSINGS) ×3 IMPLANT
BNDG COHESIVE 6X5 TAN STRL LF (GAUZE/BANDAGES/DRESSINGS) ×6 IMPLANT
BNDG CONFORM 3 STRL LF (GAUZE/BANDAGES/DRESSINGS) IMPLANT
BNDG GAUZE STRTCH 6 (GAUZE/BANDAGES/DRESSINGS) ×3 IMPLANT
CORDS BIPOLAR (ELECTRODE) IMPLANT
COVER LIGHT HANDLE STERIS (MISCELLANEOUS) ×3 IMPLANT
COVER SURGICAL LIGHT HANDLE (MISCELLANEOUS) ×3 IMPLANT
CUFF TOURNIQUET SINGLE 24IN (TOURNIQUET CUFF) ×3 IMPLANT
CUFF TOURNIQUET SINGLE 34IN LL (TOURNIQUET CUFF) ×2 IMPLANT
CUFF TOURNIQUET SINGLE 44IN (TOURNIQUET CUFF) IMPLANT
DRAPE INCISE IOBAN 66X45 STRL (DRAPES) ×2 IMPLANT
DRAPE U-SHAPE 47X51 STRL (DRAPES) ×3 IMPLANT
DRSG EMULSION OIL 3X3 NADH (GAUZE/BANDAGES/DRESSINGS) ×3 IMPLANT
DRSG MEPITEL 4X7.2 (GAUZE/BANDAGES/DRESSINGS) ×2 IMPLANT
DRSG VAC ATS SM SENSATRAC (GAUZE/BANDAGES/DRESSINGS) ×2 IMPLANT
DURAPREP 26ML APPLICATOR (WOUND CARE) ×3 IMPLANT
ELECT CAUTERY BLADE 6.4 (BLADE) IMPLANT
ELECT REM PT RETURN 9FT ADLT (ELECTROSURGICAL) ×3
ELECTRODE REM PT RTRN 9FT ADLT (ELECTROSURGICAL) ×2 IMPLANT
FACESHIELD WRAPAROUND (MASK) IMPLANT
FACESHIELD WRAPAROUND OR TEAM (MASK) IMPLANT
GAUZE SPONGE 4X4 12PLY STRL (GAUZE/BANDAGES/DRESSINGS) ×6 IMPLANT
GAUZE XEROFORM 5X9 LF (GAUZE/BANDAGES/DRESSINGS) ×3 IMPLANT
GLOVE BIO SURGEON STRL SZ 6.5 (GLOVE) ×2 IMPLANT
GLOVE BIOGEL PI IND STRL 6.5 (GLOVE) ×1 IMPLANT
GLOVE BIOGEL PI INDICATOR 6.5 (GLOVE) ×1
GLOVE SKINSENSE NS SZ7.5 (GLOVE) ×3
GLOVE SKINSENSE STRL SZ7.5 (GLOVE) ×6 IMPLANT
GLOVE SURG SS PI 7.0 STRL IVOR (GLOVE) ×2 IMPLANT
GLOVE SURG SS PI 7.5 STRL IVOR (GLOVE) ×3 IMPLANT
GLOVE SURG SYN 7.5  E (GLOVE) ×1
GLOVE SURG SYN 7.5 E (GLOVE) ×2 IMPLANT
GLOVE SURG SYN 7.5 PF PI (GLOVE) IMPLANT
GOWN STRL REIN XL XLG (GOWN DISPOSABLE) ×6 IMPLANT
GOWN STRL REUS W/ TWL XL LVL3 (GOWN DISPOSABLE) ×1 IMPLANT
GOWN STRL REUS W/TWL XL LVL3 (GOWN DISPOSABLE) ×3
HANDPIECE INTERPULSE COAX TIP (DISPOSABLE)
KIT BASIN OR (CUSTOM PROCEDURE TRAY) ×3 IMPLANT
KIT ROOM TURNOVER OR (KITS) ×3 IMPLANT
MANIFOLD NEPTUNE II (INSTRUMENTS) ×3 IMPLANT
NS IRRIG 1000ML POUR BTL (IV SOLUTION) ×3 IMPLANT
PACK ORTHO EXTREMITY (CUSTOM PROCEDURE TRAY) ×3 IMPLANT
PAD ABD 8X10 STRL (GAUZE/BANDAGES/DRESSINGS) ×3 IMPLANT
PAD ARMBOARD 7.5X6 YLW CONV (MISCELLANEOUS) ×6 IMPLANT
PADDING CAST ABS 4INX4YD NS (CAST SUPPLIES) ×1
PADDING CAST ABS COTTON 4X4 ST (CAST SUPPLIES) ×2 IMPLANT
PADDING CAST COTTON 6X4 STRL (CAST SUPPLIES) ×3 IMPLANT
SET HNDPC FAN SPRY TIP SCT (DISPOSABLE) IMPLANT
SPONGE LAP 18X18 X RAY DECT (DISPOSABLE) ×3 IMPLANT
STOCKINETTE IMPERVIOUS 9X36 MD (GAUZE/BANDAGES/DRESSINGS) ×3 IMPLANT
SUCTION FRAZIER HANDLE 10FR (MISCELLANEOUS) ×1
SUCTION TUBE FRAZIER 10FR DISP (MISCELLANEOUS) ×2 IMPLANT
SUT ETHILON 2 0 FS 18 (SUTURE) ×9 IMPLANT
SUT ETHILON 2 0 PSLX (SUTURE) ×6 IMPLANT
SUT PDS AB 0 CT 36 (SUTURE) IMPLANT
SUT VIC AB 2-0 FS1 27 (SUTURE) ×6 IMPLANT
SWAB CULTURE ESWAB REG 1ML (MISCELLANEOUS) IMPLANT
TOWEL OR 17X24 6PK STRL BLUE (TOWEL DISPOSABLE) ×3 IMPLANT
TOWEL OR 17X26 10 PK STRL BLUE (TOWEL DISPOSABLE) ×3 IMPLANT
TUBE CONNECTING 12X1/4 (SUCTIONS) ×3 IMPLANT
TUBE FEEDING 5FR 15 INCH (TUBING) IMPLANT
TUBING CYSTO DISP (UROLOGICAL SUPPLIES) ×3 IMPLANT
UNDERPAD 30X30 (UNDERPADS AND DIAPERS) ×6 IMPLANT
WATER STERILE IRR 1000ML POUR (IV SOLUTION) ×3 IMPLANT
YANKAUER SUCT BULB TIP NO VENT (SUCTIONS) ×3 IMPLANT

## 2016-08-27 NOTE — Transfer of Care (Signed)
Immediate Anesthesia Transfer of Care Note  Patient: [name] Snow  Procedure(s) Performed: Procedure(s): IRRIGATION AND DEBRIDEMENT RIGHT FOOT, VAC,  4TH RAY AMPUTATION (Right)  Patient Location: PACU  Anesthesia Type:General  Level of Consciousness: awake, alert , oriented and patient cooperative  Airway & Oxygen Therapy: Patient Spontanous Breathing  Post-op Assessment: Report given to RN and Post -op Vital signs reviewed and stable  Post vital signs: Reviewed and stable  Last Vitals:  Vitals:   08/27/16 0414 08/27/16 1545  BP: (!) 141/73   Pulse: 61 63  Resp: 16 16  Temp: 36.5 C 36.9 C    Last Pain:  Vitals:   08/27/16 0945  TempSrc:   PainSc: Asleep      Patients Stated Pain Goal: 3 (08/27/16 0452)  Complications: No apparent anesthesia complications

## 2016-08-27 NOTE — Progress Notes (Signed)
08/27/16  Called report to PACU [id number]

## 2016-08-27 NOTE — Progress Notes (Signed)
Patient was picked up by [facility] around 0015 this am for transfer to [facility] for surgery tomorrow.  Patient's vitals and condition are stable. Patient was transported with Negative pressure wound therapy.  He had no complaints upon transfer. Report was given to [facility] and receiving RN Marlon Pel. Priscille Kluver

## 2016-08-27 NOTE — Anesthesia Preprocedure Evaluation (Addendum)
Anesthesia Evaluation  Patient identified by MRN, date of birth, ID band Patient awake    Reviewed: Allergy & Precautions, NPO status , Patient's Chart, lab work & pertinent test results  Airway Mallampati: II       Dental  (+) Poor Dentition, Chipped, Loose, Missing,    Pulmonary neg pulmonary ROS,    breath sounds clear to auscultation       Cardiovascular hypertension, Pt. on medications +CHF   Rhythm:Regular Rate:Normal     Neuro/Psych  Neuromuscular disease negative psych ROS   GI/Hepatic negative GI ROS, Neg liver ROS,   Endo/Other  diabetes, Type 2, Insulin Dependent  Renal/GU CRFRenal disease  negative genitourinary   Musculoskeletal  (+) Arthritis , Osteoarthritis,    Abdominal (+) + obese,   Peds negative pediatric ROS (+)  Hematology negative hematology ROS (+)   Anesthesia Other Findings   Reproductive/Obstetrics negative OB ROS                             Lab Results  Component Value Date   WBC 12.0 (H) 08/26/2016   HGB 9.8 (L) 08/26/2016   HCT 30.2 (L) 08/26/2016   MCV 84.8 08/26/2016   PLT 334 08/26/2016   Lab Results  Component Value Date   CREATININE 1.34 (H) 08/26/2016   BUN 30 (H) 08/26/2016   NA 137 08/26/2016   K 4.4 08/26/2016   CL 103 08/26/2016   CO2 24 08/26/2016   Lab Results  Component Value Date   INR 0.99 08/22/2016   INR 0.99 05/23/2010   EKG: normal sinus rhythm.  Anesthesia Physical  Anesthesia Plan  ASA: III  Anesthesia Plan: General   Post-op Pain Management:    Induction: Intravenous  Airway Management Planned: LMA  Additional Equipment:   Intra-op Plan:   Post-operative Plan: Extubation in OR  Informed Consent: I have reviewed the patients History and Physical, chart, labs and discussed the procedure including the risks, benefits and alternatives for the proposed anesthesia with the patient or authorized representative  who has indicated his/her understanding and acceptance.   Dental advisory given  Plan Discussed with: CRNA and Surgeon  Anesthesia Plan Comments:         Anesthesia Quick Evaluation

## 2016-08-27 NOTE — Progress Notes (Signed)
Occupational Therapy Treatment Patient Details Name: [name] Snow MRN: [id number] DOB: Nov 03, 1965 Today's Date: 08/27/2016    History of present illness 51 year old male with a past medical history of uncontrolled type 2 diabetes complicated by neuropathy and CKD 3, HTN, HLD, gout, and chronic pain who has had issues with diabetic foot ulcer involving the right foot since March.  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  Pt is scheduled for surgery later today on R foot.    Follow Up Recommendations  No OT follow up    Equipment Recommendations  3 in 1 bedside commode    Recommendations for Other Services      Precautions / Restrictions Precautions Precautions: Fall Precaution Comments: wound vac Restrictions Weight Bearing Restrictions: Yes RLE Weight Bearing: Non weight bearing       Mobility Bed Mobility Overal bed mobility: Modified Independent Bed Mobility: Supine to Sit           General bed mobility comments: no assist needed.  pt watched lines ily.  Transfers Overall transfer level: Needs assistance Equipment used: Rolling walker (2 wheeled) Transfers: Sit to/from Stand Sit to Stand: Supervision         General transfer comment: verbal cues for hand placement and maintaining NWB    Balance Overall balance assessment: Needs assistance Sitting-balance support: Feet supported Sitting balance-Leahy Scale: Good     Standing balance support: Bilateral upper extremity supported;During functional activity Standing balance-Leahy Scale: Poor Standing balance comment: Pt NWB on RLE but does touch that foot down when standing at sink despite cues not to.                           ADL either  performed or assessed with clinical judgement   ADL Overall ADL's : Needs assistance/impaired Eating/Feeding: Independent;Sitting   Grooming: Supervision/safety;Standing   Upper Body Bathing: Set up;Sitting   Lower Body Bathing: Minimal assistance;Sit to/from stand;Cueing for safety   Upper Body Dressing : Sitting;Set up   Lower Body Dressing: Minimal assistance;Sit to/from stand   Toilet Transfer: Pharmacist, hospital Details (indicate cue type and reason): Pt continues to ask to go to bathroom without assist.  Pt has wound vac and IV so needs assist just for someone to hold these items.  Toileting- Clothing Manipulation and Hygiene: Supervision/safety;Sit to/from stand;Cueing for safety       Functional mobility during ADLs: Supervision/safety;Rolling walker General ADL Comments: Pt can complete most adls at EOB and can stand to groom at sink with Supervision.  Pt most limited by being attached to wound vac and IV.     Vision   Vision Assessment?: No apparent visual deficits   Perception     Praxis      Cognition Arousal/Alertness: Awake/alert Behavior During Therapy: WFL for tasks assessed/performed Overall Cognitive Status: Within Functional Limits for tasks assessed                                          Exercises     Shoulder Instructions       General Comments Pt overall likes to do things his way  despite the fact they might not be safe.  Attempted to let pt control how things were done and then recommended how he could do it safer in the process.    Pertinent Vitals/ Pain       Pain Assessment: 0-10 Pain Score: 2  Pain Location: R foot Pain Descriptors / Indicators: Aching Pain Intervention(s): Limited activity within patient's tolerance;Repositioned;Monitored during session  Home Living                                          Prior Functioning/Environment               Frequency  Min 2X/week        Progress Toward Goals  OT Goals(current goals can now be found in the care plan section)  Progress towards OT goals: Progressing toward goals  Acute Rehab OT Goals Patient Stated Goal: ambulate again, pt appears concerned with care of his mother (has dementia) while he is recovering OT Goal Formulation: With patient Time For Goal Achievement: 09/08/16 Potential to Achieve Goals: Good ADL Goals Pt Will Perform Grooming: with modified independence;standing Pt Will Perform Lower Body Dressing: with modified independence;sit to/from stand;with adaptive equipment Pt Will Transfer to Toilet: with modified independence;regular height toilet;ambulating;bedside commode Pt Will Perform Toileting - Clothing Manipulation and hygiene: with modified independence;sit to/from stand  Plan Discharge plan remains appropriate    Co-evaluation                 End of Session Equipment Utilized During Treatment: Rolling walker  OT Visit Diagnosis: Unsteadiness on feet (R26.81);Other abnormalities of gait and mobility (R26.89)   Activity Tolerance Patient tolerated treatment well   Patient Left in chair;with family/visitor present   Nurse Communication Mobility status        Time: 1610-9604 OT Time Calculation (min): 19 min  Charges: OT General Charges $OT Visit: 1 Procedure OT Treatments $Self Care/Home Management : 8-22 mins  Tory Emerald, OTR/L [phone]   Hope Budds 08/27/2016, 12:42 PM

## 2016-08-27 NOTE — Progress Notes (Signed)
[facility] PROGRESS NOTE  [name] Snow:[id number] DOB: 06/28/1965 DOA: 08/22/2016  PCP: Elizabeth Sauer  Brief History/Interval Summary:   [name] with a past medical history of uncontrolled type 2 diabetes (last known A1c greater than 10 per patient) complicated by neuropathy and CKD 3, HTN, HLD, gout, and chronic pain who has had issues with diabetic foot ulcer involving the right foot since March.  He was seen by an outpatient provider on 3/1 and prescribed Augmentin for cellulitis. Patient admitted that he had been neglecting his foot due to personal stressors. He recently separated from his wife and has been taking care of his sick mother. Presented with worsening swelling of the right foot. Initially went to urgent care center and was advised to come to the emergency department. Evaluation has revealed osteomyelitis and abscess. Patient underwent surgery.  Reason for Visit: Right fifth toe osteomyelitis with abscess  Consultants: Orthopedics: Dr. Erlinda Hong  Procedures:   On 4/8:   1. Irrigation and debridement of bone, muscle, subcutaneous tissue, tendon, skin of right foot 8 x 6 cm 2. Right foot fifth ray amputation 3. Application of wound VAC less than 50 cm  Antibiotics:  Anti-infectives    Start     Dose/Rate Route Frequency Ordered Stop   08/26/16 1800  vancomycin (VANCOCIN) IVPB 1000 mg/200 mL premix     1,000 mg 200 mL/hr over 60 Minutes Intravenous Every 12 hours 08/26/16 0619     08/24/16 1030  ceFAZolin (ANCEF) IVPB 2g/100 mL premix  Status:  Discontinued     2 g 200 mL/hr over 30 Minutes Intravenous Every 6 hours 08/24/16 1017 08/24/16 1024   08/23/16 0600  vancomycin (VANCOCIN) 1,250 mg in sodium chloride 0.9 % 250 mL IVPB  Status:  Discontinued     1,250 mg 166.7 mL/hr over 90 Minutes Intravenous Every 24 hours 08/22/16 1959 08/26/16 0618   08/22/16 2300  ceFEPIme (MAXIPIME) 1 g in dextrose 5 % 50 mL IVPB     1 g 100 mL/hr over 30  Minutes Intravenous Every 8 hours 08/22/16 2136     08/22/16 2200  metroNIDAZOLE (FLAGYL) tablet 500 mg     500 mg Oral Every 8 hours 08/22/16 2130     08/22/16 1800  vancomycin (VANCOCIN) IVPB 1000 mg/200 mL premix     1,000 mg 200 mL/hr over 60 Minutes Intravenous NOW 08/22/16 1754 08/22/16 1942      Subjective/Interval History:  Patient in bed, he denies any headache, no fever or chills, no chest pain or abdominal pain, no shortness of breath. No focal weakness.  Objective:  Vital Signs  Vitals:   08/26/16 2035 08/27/16 0012 08/27/16 0120 08/27/16 0414  BP: (!) 159/84 (!) 157/86 (!) 142/80 (!) 141/73  Pulse: 62 70 69 61  Resp: '16 16 15 16  '$ Temp: 98.5 F (36.9 C) 98.2 F (36.8 C) 98.6 F (37 C) 97.7 F (36.5 C)  TempSrc: Oral  Oral Oral  SpO2: 99% 99% 98% 98%  Weight:      Height:        Intake/Output Summary (Last 24 hours) at 08/27/16 1210 Last data filed at 08/27/16 1100  Gross per 24 hour  Intake          1026.25 ml  Output             2200 ml  Net         -1173.75 ml   Filed Weights   08/22/16 1639 08/22/16 2141  Weight: 116.1 kg (256 lb) 118.5 kg (261 lb 3.2 oz)    General appearance: alert, cooperative, appears stated age and no distress Resp: clear to auscultation bilaterally Cardio: regular rate and rhythm, S1, S2 normal, no murmur, click, rub or gallop GI: soft, non-tender; bowel sounds normal; no masses,  no organomegaly Extremities:  Right foot has wound VAC, partial amputation with minimal surrounding erythema and warmth Neurologic: Awake and alert. Oriented 3. No focal neurological deficits are noted.  Lab Results:  Data Reviewed: I have personally reviewed following labs and imaging studies  CBC:  Recent Labs Lab 08/22/16 1838 08/23/16 0413 08/24/16 0415 08/25/16 0439 08/26/16 0510  WBC 18.4* 13.8* 12.9* 10.1 12.0*  NEUTROABS 14.3*  --   --   --   --   HGB 9.9* 9.6* 9.8* 8.9* 9.8*  HCT 30.5* 30.1* 31.0* 27.5* 30.2*  MCV 87.4 88.0  85.4 84.9 84.8  PLT 333 326 324 278 275    Basic Metabolic Panel:  Recent Labs Lab 08/22/16 1838 08/23/16 0413 08/24/16 0415 08/25/16 0439 08/26/16 0510  NA 134* 137 137 138 137  K 4.2 4.2 4.1 4.2 4.4  CL 101 104 107 107 103  CO2 '25 25 25 26 24  '$ GLUCOSE 255* 254* 160* 148* 210*  BUN 44* 23* 21* 26* 30*  CREATININE 1.64* 1.37* 1.43* 1.21 1.34*  CALCIUM 8.7* 8.7* 8.4* 8.4* 9.0    GFR: Estimated Creatinine Clearance: 85.7 mL/min (A) (by C-G formula based on SCr of 1.34 mg/dL (H)).  Liver Function Tests:  Recent Labs Lab 08/22/16 2235 08/24/16 0415  AST 14* 13*  ALT 12* 9*  ALKPHOS 87 73  BILITOT 0.8 0.7  PROT 8.6* 7.7  ALBUMIN 3.3* 3.0*    Coagulation Profile:  Recent Labs Lab 08/22/16 2235  INR 0.99    CBG:  Recent Labs Lab 08/26/16 1220 08/26/16 1717 08/26/16 2114 08/27/16 0809 08/27/16 1125  GLUCAP 255* 163* 218* 139* 148*     Radiology Studies: No results found.   Medications:  Scheduled: . allopurinol  300 mg Oral Daily  . amitriptyline  100 mg Oral QHS  . ceFEPime (MAXIPIME) IV  1 g Intravenous Q8H  . chlorhexidine  15 mL Mouth Rinse BID  . gabapentin  1,200 mg Oral TID  . insulin aspart  0-9 Units Subcutaneous Q4H  . insulin glargine  35 Units Subcutaneous BID  . mouth rinse  15 mL Mouth Rinse q12n4p  . metroNIDAZOLE  500 mg Oral Q8H  . multivitamin with minerals  1 tablet Oral Daily  . protein supplement shake  11 oz Oral BID BM  . rosuvastatin  20 mg Oral Daily  . vancomycin  1,000 mg Intravenous Q12H   Continuous: . dextrose 75 mL/hr at 08/27/16 1700   FVC:[id number] **OR** acetaminophen, diphenhydrAMINE, HYDROcodone-acetaminophen, magnesium citrate, methocarbamol **OR** methocarbamol (ROBAXIN)  IV, metoCLOPramide **OR** metoCLOPramide (REGLAN) injection, morphine injection, ondansetron **OR** ondansetron (ZOFRAN) IV, polyethylene glycol, sorbitol  Assessment/Plan:   Septic arthritis with abscess and osteomyelitis  involving the Right fifth MTP joint  Patient was found to have septic arthritis along with abscess and osteomyelitis involving the right fifth MTP joint. CRP 21 and ESR 132. Patient underwent surgery 4/8. He underwent right fifth ray amputation. Wound VAC has been applied. For now we will continue vancomycin, cefepime and Flagyl for now. For now all cultures are negative he is due for repeat surgery on 08/27/2016 by Dr. Erlinda Hong.  AKI on CKD 3 Baseline creatinine appears to be around 1.2. It  was 1.64 at admission. Patient was given fluids with improvement. ACE inhibitor and NSAIDs monitor renal function.  Hyperlipidemia on statin   Diabetes mellitus type 2 with peripheral neuropathy  Poorly controlled with a hemoglobin A1c of 9.7 in March. Her entry on Lantus and sliding scale, will contact Lantus dose to half on 08/27/2016 since he is nothing by mouth for surgery, sliding scale insulin every 4 hours with gentle D5 drip while he is nothing by mouth.   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 Patient has a history of kidney stone and microscopic hematuria in the past. CT scan of the abdomen from 2015 did show a left kidney stone. Patient is asymptomatic at this time. He has known history of uric acid stones.   Diabetic neuropathy Continue Neurontin, Elavil  Normocytic anemia Drop in hemoglobin is due to dilution. No evidence for overt bleeding. He likely has anemia of chronic disease.  Chronic pain, narcotic dependence Patient is on hydrocodone at home. This is being continued.  Recent stressors He recently separated from his wife, which was unexpected for him. He is also taking care of his elderly mother without any help. Provide emotional support as needed.   DVT  Prophylaxis: Lovenox    Code Status: Full code  Family Communication: Discussed with the patient  Disposition Plan: Management as outlined above. Await transfer to [facility] for repeat surgery to right foot on Wednesday.    LOS: 5 days   Signature  Lala Lund M.D on 08/27/2016 at 12:10 PM  Between 7am to 7pm - Pager - [phone] ( page via [facility], text pages only, please mention full 10 digit call back number).  After 7pm go to [email or web address] - password [facility]

## 2016-08-27 NOTE — Progress Notes (Signed)
Pt admitted to the unit from [facility]. Pt is stable, alert and oriented per baseline. Oriented to room, staff, and call bell. Educated to call for any assistance. Bed in lowest position, call bell within reach- will continue to monitor.

## 2016-08-27 NOTE — Anesthesia Procedure Notes (Signed)
Procedure Name: LMA Insertion Date/Time: 08/27/2016 2:50 PM Performed by: Adonis Housekeeper Pre-anesthesia Checklist: Patient identified, Emergency Drugs available, Suction available and Patient being monitored Patient Re-evaluated:Patient Re-evaluated prior to inductionOxygen Delivery Method: Circle system utilized Preoxygenation: Pre-oxygenation with 100% oxygen Intubation Type: IV induction Ventilation: Mask ventilation without difficulty LMA: LMA inserted LMA Size: 4.0 Number of attempts: 1 Placement Confirmation: positive ETCO2 and breath sounds checked- equal and bilateral Tube secured with: Tape Dental Injury: Teeth and Oropharynx as per pre-operative assessment

## 2016-08-27 NOTE — Progress Notes (Signed)
Inpatient Diabetes Program Recommendations  AACE/ADA: New Consensus Statement on Inpatient Glycemic Control (2015)  Target Ranges:  Prepandial:   less than 140 mg/dL      Peak postprandial:   less than 180 mg/dL (1-2 hours)      Critically ill patients:  140 - 180 mg/dL   Lab Results  Component Value Date   GLUCAP 139 (H) 08/27/2016   HGBA1C 9.3 (H) 08/22/2016    Review of Glycemic Control  Will need meal coverage insulin.  Inpatient Diabetes Program Recommendations:     Add Novolog 4 units tidwc for meal coverage insulin.  Follow. Thank you. Ailene Ards, RD, LDN, CDE Inpatient Diabetes Coordinator [phone]

## 2016-08-27 NOTE — Progress Notes (Signed)
This nurse spoke with patient who is very upset. Says housekeeping did not clean his room properly. States she did not wipe the door knobs and there was no toilet paper in the bathroom. Upset that he has not eaten all day due to surgery and that his food took 2 hours and 20 mins to come.  States he did not want to come to [facility] because he had already heard the horror stories and he does not understand why they could not do his surgery at [facility]. States he had a better stay at [facility]. Patient also upset that PACU left his chart on the bedside table and the dietary staff placed his tray where he could not reach. Then he spilled his urinal and that just made things even worse.  States that he does not feel like he is being taken care of, everyone seems in a rush.  States that he does not have a problem with the nursing staff though.  I stated that I could understand his frustrations and asked what I could do to make his stay more pleasant. Patient states that he wasn't sure how I could do that.  I then wiped down his door knobs with the purple top wipes. Explained to him that I would let the staff know of his concerns and that I hope that we could make things better for him during his stay.  Patient was very appreciative of my time. Nursing will continue monitor and assist.

## 2016-08-27 NOTE — Op Note (Signed)
   Date of Surgery: 08/27/2016  INDICATIONS: [name] Snow is a 51 y.o.-year-old male with a right foot osteomyelitis and abscess s/p debridement; The patient did consent to the procedure after discussion of the risks and benefits.  PREOPERATIVE DIAGNOSIS: Right foot abscess and osteomyelitis  POSTOPERATIVE DIAGNOSIS: Same.  PROCEDURE:  1. Right fourth ray amputation 2. Right foot sharp excisional debridement of bone, muscle, skin 10 x 8 cm 3. Application wound VAC greater than 50 cm  SURGEON: N. Glee Arvin, M.D.  ASSIST: April Chilton Si, RNFA.  ANESTHESIA:  general  IV FLUIDS AND URINE: See anesthesia.  ESTIMATED BLOOD LOSS: minimal mL.  IMPLANTS: none  DRAINS: wound vac  COMPLICATIONS: None.  DESCRIPTION OF PROCEDURE: The patient was brought to the operating room and placed supine on the operating table.  The patient had been signed prior to the procedure and this was documented. The patient had the anesthesia placed by the anesthesiologist.  A time-out was performed to confirm that this was the correct patient, site, side and location. The patient did receive antibiotics prior to the incision and was re-dosed during the procedure as needed at indicated intervals.  The patient had the operative extremity prepped and draped in the standard surgical fashion.    After careful inspection of the surgical wound is obvious that he still had nonviable tissue on the medial side of the wound edge that did not exhibit any bleeding. His fourth toe was also dusky and had very sluggish capillary refill. The fourth metatarsal also exhibited continued infection. Therefore the decision was made to perform further sharp excisional debridement of the bone, muscle, skin and also a fourth ray amputation. The fourth ray amputation was performed using an oscillating saw at the base of the fourth metatarsal.  All tissues exhibited good punctate bleeding after the debridement. I then thoroughly irrigated the wound  with 6 L normal saline. A wound VAC was then placed and set to -75 mmHg.    POSTOPERATIVE PLAN: The patient will need to return back to the operating room for another washout on Friday.  [name] Reel, MD [facility] [phone] 3:43 PM

## 2016-08-28 ENCOUNTER — Telehealth: Payer: Self-pay | Admitting: Physician Assistant

## 2016-08-28 ENCOUNTER — Encounter (HOSPITAL_COMMUNITY): Payer: Self-pay | Admitting: Orthopaedic Surgery

## 2016-08-28 LAB — BASIC METABOLIC PANEL
ANION GAP: 12 (ref 5–15)
BUN: 22 mg/dL — AB (ref 6–20)
CO2: 26 mmol/L (ref 22–32)
Calcium: 8.8 mg/dL — ABNORMAL LOW (ref 8.9–10.3)
Chloride: 98 mmol/L — ABNORMAL LOW (ref 101–111)
Creatinine, Ser: 1.15 mg/dL (ref 0.61–1.24)
Glucose, Bld: 114 mg/dL — ABNORMAL HIGH (ref 65–99)
Potassium: 4.5 mmol/L (ref 3.5–5.1)
SODIUM: 136 mmol/L (ref 135–145)

## 2016-08-28 LAB — CULTURE, BLOOD (ROUTINE X 2)
CULTURE: NO GROWTH
Culture: NO GROWTH
Special Requests: ADEQUATE
Special Requests: ADEQUATE

## 2016-08-28 LAB — CBC
HCT: 31.1 % — ABNORMAL LOW (ref 39.0–52.0)
HEMOGLOBIN: 9.8 g/dL — AB (ref 13.0–17.0)
MCH: 27.3 pg (ref 26.0–34.0)
MCHC: 31.5 g/dL (ref 30.0–36.0)
MCV: 86.6 fL (ref 78.0–100.0)
PLATELETS: 375 10*3/uL (ref 150–400)
RBC: 3.59 MIL/uL — AB (ref 4.22–5.81)
RDW: 13.1 % (ref 11.5–15.5)
WBC: 13 10*3/uL — AB (ref 4.0–10.5)

## 2016-08-28 LAB — GLUCOSE, CAPILLARY
GLUCOSE-CAPILLARY: 124 mg/dL — AB (ref 65–99)
GLUCOSE-CAPILLARY: 243 mg/dL — AB (ref 65–99)
GLUCOSE-CAPILLARY: 218 mg/dL — AB (ref 65–99)
Glucose-Capillary: 145 mg/dL — ABNORMAL HIGH (ref 65–99)
Glucose-Capillary: 175 mg/dL — ABNORMAL HIGH (ref 65–99)
Glucose-Capillary: 179 mg/dL — ABNORMAL HIGH (ref 65–99)

## 2016-08-28 LAB — VANCOMYCIN, TROUGH: VANCOMYCIN TR: 18 ug/mL (ref 15–20)

## 2016-08-28 MED ORDER — INSULIN GLARGINE 100 UNIT/ML ~~LOC~~ SOLN
10.0000 [IU] | SUBCUTANEOUS | Status: AC
  Administered 2016-08-29: 10 [IU] via SUBCUTANEOUS
  Filled 2016-08-28: qty 0.1

## 2016-08-28 MED ORDER — INSULIN GLARGINE 100 UNIT/ML ~~LOC~~ SOLN
15.0000 [IU] | SUBCUTANEOUS | Status: DC
Start: 2016-08-29 — End: 2016-08-28

## 2016-08-28 MED ORDER — INSULIN GLARGINE 100 UNIT/ML ~~LOC~~ SOLN
35.0000 [IU] | Freq: Two times a day (BID) | SUBCUTANEOUS | Status: DC
  Administered 2016-08-29 – 2016-08-31 (×4): 35 [IU] via SUBCUTANEOUS
  Filled 2016-08-28 (×5): qty 0.35

## 2016-08-28 NOTE — Progress Notes (Signed)
Physical Therapy Treatment Patient Details Name: [name] Snow MRN: [id number] DOB: 1965/09/30 Today's Date: 08/28/2016    History of Present Illness 51 year old male with a past medical history of uncontrolled type 2 diabetes complicated by neuropathy and CKD 3, HTN, HLD, gout, and chronic pain who has had issues with diabetic foot ulcer involving the right foot since March.  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 Pt initially refuses therapy visit, but is agreeable when offered to sit in recliner to finish his lunch. Pt instructed on the importance of keeping his LE's elevated in order to reduce edema as pt is sitting EOB with LE's dependent. Pt continues to require instructions regarding safety with mobility in order to reduce falls with IV pole and wound vac in place. Pt seems concerned about returning home to care for his demented mother.     Follow Up Recommendations  Home health PT     Equipment Recommendations  Rolling walker with 5" wheels;3in1 (PT)    Recommendations for Other Services       Precautions / Restrictions Precautions Precautions: Fall Precaution Comments: wound vac Restrictions Weight Bearing Restrictions: Yes RLE Weight Bearing: Non weight bearing    Mobility  Bed Mobility               General bed mobility comments: Sitting EOB when PT arrives  Transfers Overall transfer level: Needs assistance Equipment used: Rolling walker (2 wheeled) Transfers: Sit to/from Stand Sit to Stand: Supervision         General transfer comment: verbal cues for hand placement and maintaining NWB  Ambulation/Gait Ambulation/Gait assistance: Min guard Ambulation Distance (Feet): 20 Feet Assistive device: Rolling walker (2  wheeled) Gait Pattern/deviations: Step-to pattern Gait velocity: decreased Gait velocity interpretation: Below normal speed for age/gender General Gait Details: pt maintained NWB RLE with RW and cues for sequencing.    Stairs            Wheelchair Mobility    Modified Rankin (Stroke Patients Only)       Balance Overall balance assessment: Needs assistance Sitting-balance support: Feet supported Sitting balance-Leahy Scale: Good     Standing balance support: Bilateral upper extremity supported;During functional activity Standing balance-Leahy Scale: Poor Standing balance comment: relies on Rw for stability in standing                            Cognition Arousal/Alertness: Awake/alert Behavior During Therapy: WFL for tasks assessed/performed Overall Cognitive Status: Within Functional Limits for tasks assessed                                        Exercises      General Comments General comments (skin integrity, edema, etc.): Pt continues to be unhappy with care he is recieving. Assisted pt to recliner and educated him on maintaing his LE's elevated per MD in order in order to reduce edema in RLE      Pertinent Vitals/Pain Pain Assessment: 0-10 Pain Score: 3  Pain Location: R foot Pain Descriptors / Indicators: Aching Pain Intervention(s): Monitored during session;Premedicated before session    Home  Living                      Prior Function            PT Goals (current goals can now be found in the care plan section) Acute Rehab PT Goals Patient Stated Goal: to get back to walking Progress towards PT goals: Progressing toward goals    Frequency    Min 3X/week      PT Plan Current plan remains appropriate    Co-evaluation             End of Session Equipment Utilized During Treatment: Gait belt Activity Tolerance: Patient tolerated treatment well Patient left: in chair;with call bell/phone within  reach Nurse Communication: Mobility status PT Visit Diagnosis: Other abnormalities of gait and mobility (R26.89)     Time: 1355-1420 PT Time Calculation (min) (ACUTE ONLY): 25 min  Charges:  $Gait Training: 8-22 mins $Therapeutic Activity: 8-22 mins                    G Codes:       Colin Broach PT, DPT  [phone]    Ruel Favors Aletha Halim 08/28/2016, 5:00 PM

## 2016-08-28 NOTE — Telephone Encounter (Signed)
Pt is in the hospital and is needing to talk with sarah about his condition with his foot and possible third surgery on this foot and the care after leaving the hospital   Best number [phone]

## 2016-08-28 NOTE — Progress Notes (Signed)
Pharmacy Antibiotic Note  [name] Snow is a 51 y.o. male admitted on 08/22/2016 with Osteomyelitis.  Pharmacy has been consulted for Vancomycin and Cefepime dosing.  Vancomycin trough = 18 on 1g IV every 12 hours.  SCr improved at 1.15 with nCrCl ~ 76 mL/min.   Plan: Continue Vancomycin 1g IV every 12 hours.  Continue Cefepime 1g IV every 8 hours.  Monitor renal function, clinical status, and culture results. Follow-up length of therapy plan.   Height: 6' (182.9 cm) Weight: 261 lb 3.2 oz (118.5 kg) IBW/kg (Calculated) : 77.6  Temp (24hrs), Avg:98.4 F (36.9 C), Min:97.9 F (36.6 C), Max:98.9 F (37.2 C)   Recent Labs Lab 08/24/16 0415 08/25/16 0439 08/26/16 0510 08/27/16 1649 08/28/16 0547  WBC 12.9* 10.1 12.0* 12.6* 13.0*  CREATININE 1.43* 1.21 1.34* 1.21 1.15  VANCOTROUGH  --   --  9*  --  18    Estimated Creatinine Clearance: 99.9 mL/min (by C-G formula based on SCr of 1.15 mg/dL).    Allergies  Allergen Reactions  . No Known Allergies     Antimicrobials this admission: 4/6 >> vancomycin >> 4/6 >> cefepime >> 4/6 << metronidazole >>  Dose adjustments this admission: VT 0510 4/10 = 9; Vanco change to 1gm iv q12hr, VT--Drawn a little early and 85ml extra dose on 4/8.   VT 4/12 = 18 on 1g IV every 12 hours. Continued.    Microbiology results: 4/6 BCx: negative 4/7 MRSA PCR: neg   Thank you for allowing pharmacy to be a part of this patient's care.  Link Snuffer, PharmD, BCPS Clinical Pharmacist Clinical phone 08/28/2016 until 3:30 PM - [phone] After hours, please call #[phone] 08/28/2016 1:19 PM

## 2016-08-28 NOTE — Progress Notes (Signed)
   Subjective:  Patient reports pain as moderate.    Objective:   VITALS:   Vitals:   08/27/16 2203 08/28/16 0010 08/28/16 0430 08/28/16 1240  BP: 137/72 (!) 104/51 111/70 110/64  Pulse: 71 74 65 72  Resp: Temp: 97.9 F (36.6 C) 98.1 F (36.7 C) 98.4 F (36.9 C) 98.9 F (37.2 C)  TempSrc: Oral Oral Oral Oral  SpO2: 100% 96% 100% 100%  Weight:      Height:        Exam stable VAC with good seal   Lab Results  Component Value Date   WBC 13.0 (H) 08/28/2016   HGB 9.8 (L) 08/28/2016   HCT 31.1 (L) 08/28/2016   MCV 86.6 08/28/2016   PLT 375 08/28/2016     Assessment/Plan:  1 Day Post-Op   - will plan to return back to OR tomorrow for I&D and Acell placement, may go home with home vac (if approved) on Saturday - needs home vac unit - would recommend 4 weeks of additional abx for mop up of deep abscess and infection - consider ID consult - NPO after 8 am tomorrow, surgery is scheduled for 4 pm  Glee Arvin 08/28/2016, 1:43 PM [phone]

## 2016-08-28 NOTE — H&P (Signed)
H&P update  The surgical history has been reviewed and remains accurate without interval change.  The patient was re-examined and patient's physiologic condition has not changed significantly in the last 30 days. The condition still exists that makes this procedure necessary. The treatment plan remains the same, without new options for care.  No new pharmacological allergies or types of therapy has been initiated that would change the plan or the appropriateness of the plan.  The patient and/or family understand the potential benefits and risks.  Mayra Reel, MD 08/28/2016 9:52 PM

## 2016-08-28 NOTE — Anesthesia Postprocedure Evaluation (Addendum)
Anesthesia Post Note  Patient: [name] Snow  Procedure(s) Performed: Procedure(s) (LRB): IRRIGATION AND DEBRIDEMENT RIGHT FOOT, VAC,  4TH RAY AMPUTATION (Right)  Patient location during evaluation: PACU Anesthesia Type: General Level of consciousness: awake Pain management: pain level controlled Vital Signs Assessment: post-procedure vital signs reviewed and stable Respiratory status: spontaneous breathing Cardiovascular status: stable Postop Assessment: no signs of nausea or vomiting Anesthetic complications: no        Last Vitals:  Vitals:   08/28/16 0430 08/28/16 1240  BP: 111/70 110/64  Pulse: 65 72  Resp: 18 18  Temp: 36.9 C 37.2 C    Last Pain:  Vitals:   08/28/16 1443  TempSrc:   PainSc: 2    Pain Goal: Patients Stated Pain Goal: 3 (08/28/16 0933)               Austen Wygant JR,JOHN Susann Givens

## 2016-08-28 NOTE — Care Management (Signed)
Case manager placed order for [employer] wound VAC authorization on chart for MD to complete and sign. CM will fax to [facility] when completed. Vance Peper, RN BSN Case Manager

## 2016-08-28 NOTE — Progress Notes (Signed)
PROGRESS NOTE    [name] Snow  WGN:[id number] DOB: 13-Nov-1965 DOA: 08/22/2016 PCP: Virgilio Belling    Brief Narrative:  51 yo male with t2dm, presents with worsening right foot infection. Had failed outpatient antibiotic therapy. Right foot with worsening edema, erythema and ulceration with spontaneous drainage. Evaluated at the wound care clinic and referred for admission. On admission temperature 100.2, 3 to 4 cm ulcerated wound at the right foot. Admitted for antibiotic therapy and surgical consultation. Underwent right ray amputation.    Assessment & Plan:   Principal Problem:   Diabetic foot infection (HCC) Active Problems:   DM type 2, uncontrolled, with neuropathy (HCC)   Gout   HTN (hypertension)   AKI (acute kidney injury) (HCC)   SIRS (systemic inflammatory response syndrome) (HCC)   Leukocytosis   Acute osteomyelitis of metatarsal bone, right (HCC)   1. Right foot abscess and osteomyelitis. Will continue local wound care, for OR tomorrow for I&D. Arrangements for home wound vac. Will need 4 more weeks of antibiotic therapy, will consult ID, patient will need a picc line for antibiotic therapy. Currently on vanc and cefepime. Cultures from blood have been with no growth. Pain with improved control, continue oxycodone IR.   2. T2DM. Will continue glucose cover and monitoring, patient will be NPO after 8 am in am for scheduled surgery at 4 pm. Capillary glucose 232, 124, 243, 179. Continue iss, and basal insulin with 35 units bid. Will plan to give in am only 15 units due to NPO status.   3. AKI on CKD stage 3. Renal function with cr at 1,15 from 1,21. Will continue to follow on renal panel in am, avoid hypotension or nephrotoxic medications, k at 4,5 and Na at 136.   4. Dyslipidemia. Continue statin therapy with crestor.   5. Chronic pain syndrome. Continue gabapentin and amitriptyline.      DVT prophylaxis: enoxaparin  Code Status: full  Family Communication: No  family at the bedside Disposition Plan: home    Consultants:   Orthopedics   Procedures:   Right ray amputation   Antimicrobials:   Cefepime and vancomycin    Subjective: Patient with pain a the surgical wound, moderate in intensity, improved with pain medications, no worsening factors, no radiation, no associated nausea or vomiting.   Objective: Vitals:   08/27/16 2203 08/28/16 0010 08/28/16 0430 08/28/16 1240  BP: 137/72 (!) 104/51 111/70 110/64  Pulse: 71 74 65 72  Resp: Temp: 97.9 F (36.6 C) 98.1 F (36.7 C) 98.4 F (36.9 C) 98.9 F (37.2 C)  TempSrc: Oral Oral Oral Oral  SpO2: 100% 96% 100% 100%  Weight:      Height:        Intake/Output Summary (Last 24 hours) at 08/28/16 1350 Last data filed at 08/28/16 1240  Gross per 24 hour  Intake             1180 ml  Output              800 ml  Net              380 ml   Filed Weights   08/22/16 1639 08/22/16 2141  Weight: 116.1 kg (256 lb) 118.5 kg (261 lb 3.2 oz)    Examination:  General exam: deconditioned E ENT: no pallor or icterus, oral mucosa moist.  Respiratory system: Clear to auscultation. Respiratory effort normal, no wheezing, rales or rhonchi.  Cardiovascular system: S1 & S2 heard,  RRR. No JVD, murmurs, rubs, gallops or clicks. ++ non pitting pedal edema on the right. Gastrointestinal system: Abdomen is nondistended, soft and nontender. No organomegaly or masses felt. Normal bowel sounds heard. Central nervous system: Alert and oriented. No focal neurological deficits. Extremities: Symmetric 5 x 5 power. Skin: surgical wound with dressing and wound vac in place.      Data Reviewed: I have personally reviewed following labs and imaging studies  CBC:  Recent Labs Lab 08/22/16 1838  08/24/16 0415 08/25/16 0439 08/26/16 0510 08/27/16 1649 08/28/16 0547  WBC 18.4*  < > 12.9* 10.1 12.0* 12.6* 13.0*  NEUTROABS 14.3*  --   --   --   --   --   --   HGB 9.9*  < > 9.8* 8.9* 9.8*  11.0* 9.8*  HCT 30.5*  < > 31.0* 27.5* 30.2* 34.1* 31.1*  MCV 87.4  < > 85.4 84.9 84.8 86.5 86.6  PLT 333  < > 324 278 334 413* 375  < > = values in this interval not displayed. Basic Metabolic Panel:  Recent Labs Lab 08/23/16 0413 08/24/16 0415 08/25/16 0439 08/26/16 0510 08/27/16 1649 08/28/16 0547  NA 137 137 138 137  --  136  K 4.2 4.1 4.2 4.4  --  4.5  CL 104 107 107 103  --  98*  CO2 --  26  GLUCOSE 254* 160* 148* 210*  --  114*  BUN 23* 21* 26* 30*  --  22*  CREATININE 1.37* 1.43* 1.21 1.34* 1.21 1.15  CALCIUM 8.7* 8.4* 8.4* 9.0  --  8.8*   GFR: Estimated Creatinine Clearance: 99.9 mL/min (by C-G formula based on SCr of 1.15 mg/dL). Liver Function Tests:  Recent Labs Lab 08/22/16 2235 08/24/16 0415  AST 14* 13*  ALT 12* 9*  ALKPHOS 87 73  BILITOT 0.8 0.7  PROT 8.6* 7.7  ALBUMIN 3.3* 3.0*   No results for input(s): LIPASE, AMYLASE in the last 168 hours. No results for input(s): AMMONIA in the last 168 hours. Coagulation Profile:  Recent Labs Lab 08/22/16 2235  INR 0.99   Cardiac Enzymes: No results for input(s): CKTOTAL, CKMB, CKMBINDEX, TROPONINI in the last 168 hours. BNP (last 3 results) No results for input(s): PROBNP in the last 8760 hours. HbA1C: No results for input(s): HGBA1C in the last 72 hours. CBG:  Recent Labs Lab 08/27/16 1547 08/27/16 1651 08/27/16 2024 08/27/16 2339 08/28/16 0446  GLUCAP 133* 144* 112* 232* 124*   Lipid Profile: No results for input(s): CHOL, HDL, LDLCALC, TRIG, CHOLHDL, LDLDIRECT in the last 72 hours. Thyroid Function Tests: No results for input(s): TSH, T4TOTAL, FREET4, T3FREE, THYROIDAB in the last 72 hours. Anemia Panel: No results for input(s): VITAMINB12, FOLATE, FERRITIN, TIBC, IRON, RETICCTPCT in the last 72 hours. Sepsis Labs: No results for input(s): PROCALCITON, LATICACIDVEN in the last 168 hours.  Recent Results (from the past 240 hour(s))  Culture, blood (routine x 2)      Status: None   Collection Time: 08/22/16 10:12 PM  Result Value Ref Range Status   Specimen Description BLOOD RIGHT ANTECUBITAL  Final   Special Requests   Final    BOTTLES DRAWN AEROBIC ONLY Blood Culture adequate volume   Culture   Final    NO GROWTH 5 DAYS Performed at [facility], [address], [place], [place] [address]    Report Status 08/28/2016 FINAL  Final  Culture, blood (routine x 2)  Status: None   Collection Time: 08/22/16 10:32 PM  Result Value Ref Range Status   Specimen Description BLOOD RIGHT ANTECUBITAL  Final   Special Requests   Final    BOTTLES DRAWN AEROBIC ONLY Blood Culture adequate volume   Culture   Final    NO GROWTH 5 DAYS Performed at [facility], [address] [place], [place] [address]    Report Status 08/28/2016 FINAL  Final  Surgical pcr screen     Status: None   Collection Time: 08/23/16 11:29 AM  Result Value Ref Range Status   MRSA, PCR NEGATIVE NEGATIVE Final   Staphylococcus aureus NEGATIVE NEGATIVE Final    Comment:        The Xpert SA Assay (FDA approved for NASAL specimens in patients over 51 years of age), is one component of a comprehensive surveillance program.  Test performance has been validated by [facility] for patients greater than or equal to 69 year old. It is not intended to diagnose infection nor to guide or monitor treatment.          Radiology Studies: No results found.      Scheduled Meds: . allopurinol  300 mg Oral Daily  . amitriptyline  100 mg Oral QHS  . ceFEPime (MAXIPIME) IV  1 g Intravenous Q8H  . chlorhexidine  15 mL Mouth Rinse BID  . gabapentin  1,200 mg Oral TID  . insulin aspart  0-9 Units Subcutaneous Q4H  . insulin glargine  35 Units Subcutaneous BID  . mouth rinse  15 mL Mouth Rinse q12n4p  . metroNIDAZOLE  500 mg Oral Q8H  . multivitamin with minerals  1 tablet Oral Daily  . protein supplement shake  11 oz Oral BID BM  . rosuvastatin  20 mg Oral Daily  .  vancomycin  1,000 mg Intravenous Q12H   Continuous Infusions: . sodium chloride 125 mL/hr at 08/27/16 1745  . lactated ringers 10 mL/hr at 08/27/16 1335     LOS: 6 days       [name] Annett Gula, MD [facility] Pager [phone]  If 7PM-7AM, please contact night-coverage [email or web address] Password [email or web address] 08/28/2016, 1:50 PM

## 2016-08-28 NOTE — Telephone Encounter (Signed)
Pt wanted you to be in the loop of care. Scheduled for amputation of 2 toes tomorrow @ [facility] Wanted to keep you as his PCP and apologized for not taking care of Diabetes  FYI

## 2016-08-29 ENCOUNTER — Encounter (HOSPITAL_COMMUNITY): Payer: Self-pay

## 2016-08-29 ENCOUNTER — Inpatient Hospital Stay (HOSPITAL_COMMUNITY): Payer: PPO | Admitting: Anesthesiology

## 2016-08-29 ENCOUNTER — Encounter (HOSPITAL_COMMUNITY)
Admission: EM | Disposition: A | Discharge status: Home Health | Payer: Self-pay | Source: Home / Self Care | Attending: Internal Medicine

## 2016-08-29 DIAGNOSIS — M86171 Other acute osteomyelitis, right ankle and foot: Secondary | ICD-10-CM

## 2016-08-29 DIAGNOSIS — Z8249 Family history of ischemic heart disease and other diseases of the circulatory system: Secondary | ICD-10-CM

## 2016-08-29 DIAGNOSIS — Z818 Family history of other mental and behavioral disorders: Secondary | ICD-10-CM

## 2016-08-29 DIAGNOSIS — L03115 Cellulitis of right lower limb: Secondary | ICD-10-CM

## 2016-08-29 HISTORY — PX: I & D EXTREMITY: SHX5045

## 2016-08-29 LAB — BASIC METABOLIC PANEL
Anion gap: 8 (ref 5–15)
BUN: 23 mg/dL — AB (ref 6–20)
CHLORIDE: 99 mmol/L — AB (ref 101–111)
CO2: 28 mmol/L (ref 22–32)
CREATININE: 1.26 mg/dL — AB (ref 0.61–1.24)
Calcium: 8.4 mg/dL — ABNORMAL LOW (ref 8.9–10.3)
GFR calc non Af Amer: >60 mL/min (ref >60)
Glucose, Bld: 138 mg/dL — ABNORMAL HIGH (ref 65–99)
POTASSIUM: 4.7 mmol/L (ref 3.5–5.1)
SODIUM: 135 mmol/L (ref 135–145)

## 2016-08-29 LAB — CBC WITH DIFFERENTIAL/PLATELET
Basophils Absolute: 0 10*3/uL (ref 0.0–0.1)
Basophils Relative: 0 %
EOS ABS: 0.2 10*3/uL (ref 0.0–0.7)
Eosinophils Relative: 2 %
HEMATOCRIT: 31.3 % — AB (ref 39.0–52.0)
HEMOGLOBIN: 9.7 g/dL — AB (ref 13.0–17.0)
LYMPHS ABS: 3.9 10*3/uL (ref 0.7–4.0)
Lymphocytes Relative: 32 %
MCH: 27 pg (ref 26.0–34.0)
MCHC: 31 g/dL (ref 30.0–36.0)
MCV: 87.2 fL (ref 78.0–100.0)
MONOS PCT: 6 %
Monocytes Absolute: 0.8 10*3/uL (ref 0.1–1.0)
NEUTROS PCT: 60 %
Neutro Abs: 7.4 10*3/uL (ref 1.7–7.7)
Platelets: 346 10*3/uL (ref 150–400)
RBC: 3.59 MIL/uL — AB (ref 4.22–5.81)
RDW: 13.6 % (ref 11.5–15.5)
WBC: 12.3 10*3/uL — AB (ref 4.0–10.5)

## 2016-08-29 LAB — GLUCOSE, CAPILLARY
GLUCOSE-CAPILLARY: 143 mg/dL — AB (ref 65–99)
GLUCOSE-CAPILLARY: 188 mg/dL — AB (ref 65–99)
GLUCOSE-CAPILLARY: 197 mg/dL — AB (ref 65–99)
GLUCOSE-CAPILLARY: 198 mg/dL — AB (ref 65–99)
Glucose-Capillary: 149 mg/dL — ABNORMAL HIGH (ref 65–99)
Glucose-Capillary: 148 mg/dL — ABNORMAL HIGH (ref 65–99)

## 2016-08-29 SURGERY — IRRIGATION AND DEBRIDEMENT EXTREMITY
Anesthesia: General | Site: Foot | Laterality: Right

## 2016-08-29 MED ORDER — LACTATED RINGERS IV SOLN: INTRAVENOUS | Status: DC

## 2016-08-29 MED ORDER — ONDANSETRON HCL 4 MG/2ML IJ SOLN
INTRAMUSCULAR | Status: DC | PRN
  Administered 2016-08-29: 4 mg via INTRAVENOUS

## 2016-08-29 MED ORDER — MIDAZOLAM HCL 2 MG/2ML IJ SOLN
INTRAMUSCULAR | Status: DC | PRN
  Administered 2016-08-29: 2 mg via INTRAVENOUS

## 2016-08-29 MED ORDER — MIDAZOLAM HCL 2 MG/2ML IJ SOLN
INTRAMUSCULAR | Status: AC
  Filled 2016-08-29: qty 2

## 2016-08-29 MED ORDER — SODIUM CHLORIDE 0.9 % IR SOLN
Status: DC | PRN
  Administered 2016-08-29: 3000 mL

## 2016-08-29 MED ORDER — LIDOCAINE 2% (20 MG/ML) 5 ML SYRINGE
INTRAMUSCULAR | Status: AC
  Filled 2016-08-29: qty 5

## 2016-08-29 MED ORDER — PROPOFOL 10 MG/ML IV BOLUS
INTRAVENOUS | Status: AC
  Filled 2016-08-29: qty 20

## 2016-08-29 MED ORDER — ONDANSETRON HCL 4 MG/2ML IJ SOLN
INTRAMUSCULAR | Status: AC
  Filled 2016-08-29: qty 2

## 2016-08-29 MED ORDER — SODIUM CHLORIDE 0.9% FLUSH
10.0000 mL | INTRAVENOUS | Status: DC | PRN
Start: 2016-08-29 — End: 2016-08-31

## 2016-08-29 MED ORDER — METRONIDAZOLE 500 MG PO TABS
500.0000 mg | ORAL_TABLET | Freq: Three times a day (TID) | ORAL | Status: DC
  Administered 2016-08-29 – 2016-08-31 (×5): 500 mg via ORAL
  Filled 2016-08-29 (×5): qty 1

## 2016-08-29 MED ORDER — FENTANYL CITRATE (PF) 100 MCG/2ML IJ SOLN
INTRAMUSCULAR | Status: DC | PRN
  Administered 2016-08-29: 25 ug via INTRAVENOUS

## 2016-08-29 MED ORDER — FENTANYL CITRATE (PF) 250 MCG/5ML IJ SOLN
INTRAMUSCULAR | Status: AC
  Filled 2016-08-29: qty 5

## 2016-08-29 MED ORDER — EPHEDRINE SULFATE-NACL 50-0.9 MG/10ML-% IV SOSY
PREFILLED_SYRINGE | INTRAVENOUS | Status: DC | PRN
  Administered 2016-08-29 (×5): 10 mg via INTRAVENOUS

## 2016-08-29 MED ORDER — LACTATED RINGERS IV SOLN
INTRAVENOUS | Status: DC | PRN
  Administered 2016-08-29: 14:00:00 via INTRAVENOUS

## 2016-08-29 MED ORDER — PROPOFOL 10 MG/ML IV BOLUS
INTRAVENOUS | Status: DC | PRN
  Administered 2016-08-29: 200 mg via INTRAVENOUS

## 2016-08-29 MED ORDER — LIDOCAINE 2% (20 MG/ML) 5 ML SYRINGE
INTRAMUSCULAR | Status: DC | PRN
  Administered 2016-08-29: 100 mg via INTRAVENOUS

## 2016-08-29 MED ORDER — EPHEDRINE 5 MG/ML INJ
INTRAVENOUS | Status: AC
  Filled 2016-08-29: qty 10

## 2016-08-29 MED ORDER — DEXTROSE 5 % IV SOLN
2.0000 g | INTRAVENOUS | Status: DC
  Administered 2016-08-29 – 2016-08-30 (×2): 2 g via INTRAVENOUS
  Filled 2016-08-29 (×3): qty 2

## 2016-08-29 SURGICAL SUPPLY — 52 items
BANDAGE ACE 3X5.8 VEL STRL LF (GAUZE/BANDAGES/DRESSINGS) IMPLANT
BANDAGE ELASTIC 6 VELCRO ST LF (GAUZE/BANDAGES/DRESSINGS) ×1 IMPLANT
BNDG COHESIVE 1X5 TAN STRL LF (GAUZE/BANDAGES/DRESSINGS) IMPLANT
BNDG COHESIVE 4X5 TAN STRL (GAUZE/BANDAGES/DRESSINGS) ×2 IMPLANT
BNDG COHESIVE 6X5 TAN STRL LF (GAUZE/BANDAGES/DRESSINGS) ×4 IMPLANT
BNDG CONFORM 3 STRL LF (GAUZE/BANDAGES/DRESSINGS) IMPLANT
BNDG GAUZE STRTCH 6 (GAUZE/BANDAGES/DRESSINGS) ×2 IMPLANT
CANISTER WOUND CARE 500ML ATS (WOUND CARE) ×1 IMPLANT
CORDS BIPOLAR (ELECTRODE) IMPLANT
COVER SURGICAL LIGHT HANDLE (MISCELLANEOUS) ×2 IMPLANT
CUFF TOURNIQUET SINGLE 24IN (TOURNIQUET CUFF) IMPLANT
CUFF TOURNIQUET SINGLE 34IN LL (TOURNIQUET CUFF) IMPLANT
CUFF TOURNIQUET SINGLE 44IN (TOURNIQUET CUFF) IMPLANT
DRAPE INCISE IOBAN 66X45 STRL (DRAPES) IMPLANT
DRAPE U-SHAPE 47X51 STRL (DRAPES) ×2 IMPLANT
DRSG VAC ATS SM SENSATRAC (GAUZE/BANDAGES/DRESSINGS) ×1 IMPLANT
DURAPREP 26ML APPLICATOR (WOUND CARE) ×2 IMPLANT
ELECT REM PT RETURN 9FT ADLT (ELECTROSURGICAL)
ELECTRODE REM PT RTRN 9FT ADLT (ELECTROSURGICAL) IMPLANT
GAUZE SPONGE 4X4 12PLY STRL (GAUZE/BANDAGES/DRESSINGS) ×4 IMPLANT
GAUZE XEROFORM 5X9 LF (GAUZE/BANDAGES/DRESSINGS) ×2 IMPLANT
GLOVE SKINSENSE NS SZ7.5 (GLOVE) ×2
GLOVE SKINSENSE STRL SZ7.5 (GLOVE) ×2 IMPLANT
GOWN STRL REIN XL XLG (GOWN DISPOSABLE) ×4 IMPLANT
HANDPIECE INTERPULSE COAX TIP (DISPOSABLE)
KIT BASIN OR (CUSTOM PROCEDURE TRAY) ×2 IMPLANT
KIT ROOM TURNOVER OR (KITS) ×2 IMPLANT
MANIFOLD NEPTUNE II (INSTRUMENTS) ×2 IMPLANT
MATRIX SURGICAL PSM 7X10CM (Tissue) ×1 IMPLANT
MICROMATRIX 500MG (Tissue) ×2 IMPLANT
PACK ORTHO EXTREMITY (CUSTOM PROCEDURE TRAY) ×2 IMPLANT
PAD ABD 8X10 STRL (GAUZE/BANDAGES/DRESSINGS) ×2 IMPLANT
PAD ARMBOARD 7.5X6 YLW CONV (MISCELLANEOUS) ×4 IMPLANT
PADDING CAST ABS 4INX4YD NS (CAST SUPPLIES) ×1
PADDING CAST ABS COTTON 4X4 ST (CAST SUPPLIES) ×1 IMPLANT
PADDING CAST COTTON 6X4 STRL (CAST SUPPLIES) ×2 IMPLANT
SET HNDPC FAN SPRY TIP SCT (DISPOSABLE) IMPLANT
SOLUTION PARTIC MCRMTRX 500MG (Tissue) IMPLANT
SPONGE LAP 18X18 X RAY DECT (DISPOSABLE) ×2 IMPLANT
STOCKINETTE IMPERVIOUS 9X36 MD (GAUZE/BANDAGES/DRESSINGS) ×2 IMPLANT
SUT ETHILON 2 0 FS 18 (SUTURE) ×6 IMPLANT
SUT ETHILON 2 0 PSLX (SUTURE) ×2 IMPLANT
SUT VIC AB 2-0 FS1 27 (SUTURE) ×4 IMPLANT
SWAB CULTURE ESWAB REG 1ML (MISCELLANEOUS) IMPLANT
TOWEL OR 17X24 6PK STRL BLUE (TOWEL DISPOSABLE) ×2 IMPLANT
TOWEL OR 17X26 10 PK STRL BLUE (TOWEL DISPOSABLE) ×2 IMPLANT
TUBE CONNECTING 12X1/4 (SUCTIONS) ×2 IMPLANT
TUBE FEEDING ENTERAL 5FR 16IN (TUBING) IMPLANT
TUBING CYSTO DISP (UROLOGICAL SUPPLIES) ×2 IMPLANT
UNDERPAD 30X30 (UNDERPADS AND DIAPERS) ×4 IMPLANT
WATER STERILE IRR 1000ML POUR (IV SOLUTION) ×2 IMPLANT
YANKAUER SUCT BULB TIP NO VENT (SUCTIONS) ×2 IMPLANT

## 2016-08-29 NOTE — Progress Notes (Signed)
Peripherally Inserted Central Catheter/Midline Placement  The IV Nurse has discussed with the patient and/or persons authorized to consent for the patient, the purpose of this procedure and the potential benefits and risks involved with this procedure.  The benefits include less needle sticks, lab draws from the catheter, and the patient may be discharged home with the catheter. Risks include, but not limited to, infection, bleeding, blood clot (thrombus formation), and puncture of an artery; nerve damage and irregular heartbeat and possibility to perform a PICC exchange if needed/ordered by physician.  Alternatives to this procedure were also discussed.  Bard Power PICC patient education guide, fact sheet on infection prevention and patient information card has been provided to patient /or left at bedside.    PICC/Midline Placement Documentation  PICC Single Lumen 08/29/16 PICC Right Basilic 44 cm 0 cm (Active)  Indication for Insertion or Continuance of Line Home intravenous therapies (PICC only) 08/29/2016 11:00 AM  Exposed Catheter (cm) 0 cm 08/29/2016 11:00 AM  Site Assessment Clean;Dry;Intact 08/29/2016 11:00 AM  Line Status Flushed;Saline locked;Blood return noted 08/29/2016 11:00 AM  Dressing Type Transparent;Securing device 08/29/2016 11:00 AM  Dressing Status Clean;Dry;Intact;Antimicrobial disc in place 08/29/2016 11:00 AM  Dressing Change Due 09/05/16 08/29/2016 11:00 AM       Romie Jumper 08/29/2016, 11:07 AM

## 2016-08-29 NOTE — Progress Notes (Signed)
[facility] will provide [facility] Infusion [facility] services for patient upon DC if pt decides to DC to home. [facility] ([place] branch) will provide HHRN. I met with the pt and family for 45 minutes to discuss POC for home care an dhome IVABX.  Pt has copay fo $12.75 per day with his Part D plan. Pt also has concerns about this living arrangements with all beds on 3rd floor and kitchen area on 2nd floor.  Pt plans to talk further with family and make decision if home or SNF is the best plan for his DC location.   Advised pt [facility] will be on standby and is prepared for weekend DC. [facility] can staff pt stating Sunday. Pt would need Saturday IV ABX dose in the hospital before DC if he does DC then. If patient discharges after hours, please call [phone].   Larry Sierras 08/29/2016, 5:44 PM

## 2016-08-29 NOTE — Progress Notes (Signed)
OT Cancellation Note  Patient Details Name: TILER BRANDIS MRN: [id number] DOB: 11-05-1965   Cancelled Treatment:    Reason Eval/Treat Not Completed: Patient at procedure or test/ unavailable (pending surg today) Pt NPO and surg today for amputation. OT to follow up post surgery and check for updated orders.  Felecia Shelling   OTR/L Pager: [phone] Office: [phone] .  08/29/2016, 11:39 AM

## 2016-08-29 NOTE — Transfer of Care (Signed)
Immediate Anesthesia Transfer of Care Note  Patient: [name] Snow  Procedure(s) Performed: Procedure(s): IRRIGATION AND DEBRIDEMENT EXTREMITY WITH ACELL APPLICATION (Right)  Patient Location: PACU  Anesthesia Type:General  Level of Consciousness: awake, alert  and oriented  Airway & Oxygen Therapy: Patient Spontanous Breathing  Post-op Assessment: Report given to RN, Post -op Vital signs reviewed and stable and Patient moving all extremities X 4  Post vital signs: Reviewed and stable  Last Vitals:  Vitals:   08/28/16 2015 08/29/16 0349  BP: 138/62 107/73  Pulse: 65 71  Resp: 18 16  Temp: 37.2 C 36.9 C    Last Pain:  Vitals:   08/29/16 1100  TempSrc:   PainSc: 5       Patients Stated Pain Goal: 3 (08/28/16 0933)  Complications: No apparent anesthesia complications

## 2016-08-29 NOTE — Progress Notes (Signed)
Orthopedic Tech Progress Note Patient Details:  [name] Snow [birth year] [id number]  Ortho Devices Type of Ortho Device: CAM walker Ortho Device/Splint Location: Applied Cam Walker to pt Right foot.  Pt tolerated well. Right foot.  Ortho Device/Splint Interventions: Application, Adjustment   Alvina Chou 08/29/2016, 5:29 PM

## 2016-08-29 NOTE — Anesthesia Preprocedure Evaluation (Addendum)
Anesthesia Evaluation  Patient identified by MRN, date of birth, ID band Patient awake    Reviewed: Allergy & Precautions, NPO status , Patient's Chart, lab work & pertinent test results  Airway Mallampati: II  TM Distance: >3 FB Neck ROM: Full    Dental  (+) Poor Dentition, Chipped, Loose, Missing, Dental Advisory Given,    Pulmonary neg pulmonary ROS,    Pulmonary exam normal breath sounds clear to auscultation       Cardiovascular hypertension, Pt. on medications +CHF  Normal cardiovascular exam Rhythm:Regular Rate:Normal     Neuro/Psych  Neuromuscular disease negative psych ROS   GI/Hepatic negative GI ROS, Neg liver ROS,   Endo/Other  diabetes, Type 2, Insulin Dependent  Renal/GU CRF and Renal InsufficiencyRenal disease  negative genitourinary   Musculoskeletal  (+) Arthritis , Osteoarthritis,  Infected Right foot   Abdominal (+) + obese,   Peds negative pediatric ROS (+)  Hematology negative hematology ROS (+)   Anesthesia Other Findings   Reproductive/Obstetrics negative OB ROS                           Lab Results  Component Value Date   WBC 12.3 (H) 08/29/2016   HGB 9.7 (L) 08/29/2016   HCT 31.3 (L) 08/29/2016   MCV 87.2 08/29/2016   PLT 346 08/29/2016   Lab Results  Component Value Date   CREATININE 1.26 (H) 08/29/2016   BUN 23 (H) 08/29/2016   NA 135 08/29/2016   K 4.7 08/29/2016   CL 99 (L) 08/29/2016   CO2 28 08/29/2016   Lab Results  Component Value Date   INR 0.99 08/22/2016   INR 0.99 05/23/2010   EKG: normal sinus rhythm.  Anesthesia Physical  Anesthesia Plan  ASA: III  Anesthesia Plan: General   Post-op Pain Management:    Induction: Intravenous  Airway Management Planned: LMA  Additional Equipment:   Intra-op Plan:   Post-operative Plan: Extubation in OR  Informed Consent: I have reviewed the patients History and Physical, chart, labs  and discussed the procedure including the risks, benefits and alternatives for the proposed anesthesia with the patient or authorized representative who has indicated his/her understanding and acceptance.   Dental advisory given  Plan Discussed with: CRNA and Surgeon  Anesthesia Plan Comments:         Anesthesia Quick Evaluation

## 2016-08-29 NOTE — Anesthesia Procedure Notes (Signed)
Procedure Name: LMA Insertion Date/Time: 08/29/2016 2:11 PM Performed by: Rise Patience T Pre-anesthesia Checklist: Patient identified, Emergency Drugs available, Suction available and Patient being monitored Patient Re-evaluated:Patient Re-evaluated prior to inductionOxygen Delivery Method: Circle System Utilized Preoxygenation: Pre-oxygenation with 100% oxygen Intubation Type: IV induction LMA: LMA inserted LMA Size: 5.0 Number of attempts: 1 Airway Equipment and Method: Bite block Placement Confirmation: positive ETCO2 Tube secured with: Tape Dental Injury: Teeth and Oropharynx as per pre-operative assessment

## 2016-08-29 NOTE — Anesthesia Postprocedure Evaluation (Signed)
Anesthesia Post Note  Patient: [name] Snow  Procedure(s) Performed: Procedure(s) (LRB): IRRIGATION AND DEBRIDEMENT EXTREMITY WITH ACELL APPLICATION (Right)  Patient location during evaluation: PACU Anesthesia Type: General Level of consciousness: awake and alert Pain management: pain level controlled Vital Signs Assessment: post-procedure vital signs reviewed and stable Respiratory status: spontaneous breathing, nonlabored ventilation, respiratory function stable and patient connected to nasal cannula oxygen Cardiovascular status: blood pressure returned to baseline and stable Postop Assessment: no signs of nausea or vomiting Anesthetic complications: no       Last Vitals:  Vitals:   08/29/16 0349 08/29/16 1459  BP: 107/73   Pulse: 71   Resp: 16   Temp: 36.9 C 37.1 C    Last Pain:  Vitals:   08/29/16 1459  TempSrc:   PainSc: Asleep                 Baylin Cabal S

## 2016-08-29 NOTE — Care Management (Signed)
Case manager received call from Clydie Braun, [facility] Liaison, [facility] does not cover [place], but Jeri Modena, [facility] IV antibiotic specialist will be locating an agency that will work with them for antibiotics and will provide [facility] and PT.  CM to continue to monitor.

## 2016-08-29 NOTE — Progress Notes (Signed)
PROGRESS NOTE    [name] Snow  WUJ:[id number] DOB: November 27, 1965 DOA: 08/22/2016 PCP: Virgilio Belling    Brief Narrative:  51 yo male with t2dm, presents with worsening right foot infection. Had failed outpatient antibiotic therapy. Right foot with worsening edema, erythema and ulceration with spontaneous drainage. Evaluated at the wound care clinic and referred for admission. On admission temperature 100.2, 3 to 4 cm ulcerated wound at the right foot. Admitted for antibiotic therapy and surgical consultation. Underwent right ray amputation. PICC line in place, ID consulted.    Assessment & Plan:   Principal Problem:   Diabetic foot infection (HCC) Active Problems:   DM type 2, uncontrolled, with neuropathy (HCC)   Gout   HTN (hypertension)   AKI (acute kidney injury) (HCC)   SIRS (systemic inflammatory response syndrome) (HCC)   Leukocytosis   Acute osteomyelitis of metatarsal bone, right (HCC)    1. Right foot abscess and osteomyelitis. Scheduled for surgical intervention today, I&D. Arrangements for home wound vac. Picc line placed for planned 4 more weeks of antibiotic therapy, consulted ID, on vanc and cefepime. Cultures from blood with no growth. Continue pain control with oxycodone IR.   2. T2DM. Glucose cover and monitoring. Insulin regimen has been modified for today to avoid hyper or hypoglycemia, 10 units of long acting insulin in am, and resume 35 units this pm, advance diet as tolerated post procedure per surgery recommendations. Capillary glucose 149, 188, 148.   3. AKI on CKD stage 3. Cr at 1,2, avoid hypotension or nephrotoxic medications. Continue to follow renal panel in am, follow on vancomycin levels per pharmacy protocol.    4. Dyslipidemia. Continue statin therapy.   5. Chronic pain syndrome. On gabapentin and amitriptyline.      DVT prophylaxis: enoxaparin  Code Status: full  Family Communication: No family at the bedside Disposition Plan: home      Consultants:   Orthopedics   Procedures:   Right ray amputation   Antimicrobials:   Cefepime and vancomycin   Subjective: Pain controlled on right foot, no nausea or vomiting, no abdominal pain. No dyspnea or chest pain.   Objective: Vitals:   08/28/16 1747 08/28/16 2015 08/29/16 0349 08/29/16 1459  BP: 115/70 138/62 107/73   Pulse: 79 65 71   Resp: Temp: 98 F (36.7 C) 99 F (37.2 C) 98.5 F (36.9 C) 98.8 F (37.1 C)  TempSrc: Oral Oral Oral   SpO2: 100% 97% 95%   Weight:      Height:        Intake/Output Summary (Last 24 hours) at 08/29/16 1520 Last data filed at 08/29/16 1447  Gross per 24 hour  Intake              500 ml  Output             2070 ml  Net            -1570 ml   Filed Weights   08/22/16 1639 08/22/16 2141  Weight: 116.1 kg (256 lb) 118.5 kg (261 lb 3.2 oz)    Examination:  General exam: deconditioned E ENT: mild pallor, oral mucosa moist.   Respiratory system: Clear to auscultation. Respiratory effort normal. No wheezing, rales or rhonchi.  Cardiovascular system: S1 & S2 heard, RRR. No JVD, murmurs, rubs, gallops or clicks. No pedal edema. Gastrointestinal system: Abdomen is nondistended, soft and nontender. No organomegaly or masses felt. Normal bowel sounds heard. Central nervous system: Alert  and oriented. No focal neurological deficits. Extremities: Symmetric 5 x 5 power. Skin: right foot with dressing in place.    Data Reviewed: I have personally reviewed following labs and imaging studies  CBC:  Recent Labs Lab 08/22/16 1838  08/25/16 0439 08/26/16 0510 08/27/16 1649 08/28/16 0547 08/29/16 0423  WBC 18.4*  < > 10.1 12.0* 12.6* 13.0* 12.3*  NEUTROABS 14.3*  --   --   --   --   --  7.4  HGB 9.9*  < > 8.9* 9.8* 11.0* 9.8* 9.7*  HCT 30.5*  < > 27.5* 30.2* 34.1* 31.1* 31.3*  MCV 87.4  < > 84.9 84.8 86.5 86.6 87.2  PLT 333  < > 278 334 413* 375 346  < > = values in this interval not displayed. Basic  Metabolic Panel:  Recent Labs Lab 08/24/16 0415 08/25/16 0439 08/26/16 0510 08/27/16 1649 08/28/16 0547 08/29/16 0423  NA 137 138 137  --  136 135  K 4.1 4.2 4.4  --  4.5 4.7  CL 107 107 103  --  98* 99*  CO2 --  26 28  GLUCOSE 160* 148* 210*  --  114* 138*  BUN 21* 26* 30*  --  22* 23*  CREATININE 1.43* 1.21 1.34* 1.21 1.15 1.26*  CALCIUM 8.4* 8.4* 9.0  --  8.8* 8.4*   GFR: Estimated Creatinine Clearance: 91.2 mL/min (A) (by C-G formula based on SCr of 1.26 mg/dL (H)). Liver Function Tests:  Recent Labs Lab 08/22/16 2235 08/24/16 0415  AST 14* 13*  ALT 12* 9*  ALKPHOS 87 73  BILITOT 0.8 0.7  PROT 8.6* 7.7  ALBUMIN 3.3* 3.0*   No results for input(s): LIPASE, AMYLASE in the last 168 hours. No results for input(s): AMMONIA in the last 168 hours. Coagulation Profile:  Recent Labs Lab 08/22/16 2235  INR 0.99   Cardiac Enzymes: No results for input(s): CKTOTAL, CKMB, CKMBINDEX, TROPONINI in the last 168 hours. BNP (last 3 results) No results for input(s): PROBNP in the last 8760 hours. HbA1C: No results for input(s): HGBA1C in the last 72 hours. CBG:  Recent Labs Lab 08/28/16 2352 08/29/16 0344 08/29/16 0812 08/29/16 1213 08/29/16 1500  GLUCAP 145* 143* 149* 188* 148*   Lipid Profile: No results for input(s): CHOL, HDL, LDLCALC, TRIG, CHOLHDL, LDLDIRECT in the last 72 hours. Thyroid Function Tests: No results for input(s): TSH, T4TOTAL, FREET4, T3FREE, THYROIDAB in the last 72 hours. Anemia Panel: No results for input(s): VITAMINB12, FOLATE, FERRITIN, TIBC, IRON, RETICCTPCT in the last 72 hours. Sepsis Labs: No results for input(s): PROCALCITON, LATICACIDVEN in the last 168 hours.  Recent Results (from the past 240 hour(s))  Culture, blood (routine x 2)     Status: None   Collection Time: 08/22/16 10:12 PM  Result Value Ref Range Status   Specimen Description BLOOD RIGHT ANTECUBITAL  Final   Special Requests   Final    BOTTLES DRAWN  AEROBIC ONLY Blood Culture adequate volume   Culture   Final    NO GROWTH 5 DAYS Performed at [facility], [address] [place], [place] [address]    Report Status 08/28/2016 FINAL  Final  Culture, blood (routine x 2)     Status: None   Collection Time: 08/22/16 10:32 PM  Result Value Ref Range Status   Specimen Description BLOOD RIGHT ANTECUBITAL  Final   Special Requests   Final    BOTTLES DRAWN AEROBIC ONLY Blood Culture adequate volume  Culture   Final    NO GROWTH 5 DAYS Performed at [facility], [address] [place], [place] [address]    Report Status 08/28/2016 FINAL  Final  Surgical pcr screen     Status: None   Collection Time: 08/23/16 11:29 AM  Result Value Ref Range Status   MRSA, PCR NEGATIVE NEGATIVE Final   Staphylococcus aureus NEGATIVE NEGATIVE Final    Comment:        The Xpert SA Assay (FDA approved for NASAL specimens in patients over 47 years of age), is one component of a comprehensive surveillance program.  Test performance has been validated by [facility] for patients greater than or equal to 58 year old. It is not intended to diagnose infection nor to guide or monitor treatment.          Radiology Studies: No results found.      Scheduled Meds: . [MAR Hold] allopurinol  300 mg Oral Daily  . [MAR Hold] amitriptyline  100 mg Oral QHS  . [MAR Hold] ceFEPime (MAXIPIME) IV  1 g Intravenous Q8H  . [MAR Hold] chlorhexidine  15 mL Mouth Rinse BID  . [MAR Hold] gabapentin  1,200 mg Oral TID  . [MAR Hold] insulin aspart  0-9 Units Subcutaneous Q4H  . [MAR Hold] insulin glargine  35 Units Subcutaneous BID  . [MAR Hold] mouth rinse  15 mL Mouth Rinse q12n4p  . [MAR Hold] metroNIDAZOLE  500 mg Oral Q8H  . [MAR Hold] multivitamin with minerals  1 tablet Oral Daily  . [MAR Hold] protein supplement shake  11 oz Oral BID BM  . [MAR Hold] rosuvastatin  20 mg Oral Daily  . [MAR Hold] vancomycin  1,000 mg Intravenous Q12H    Continuous Infusions: . sodium chloride 125 mL/hr at 08/29/16 1109  . lactated ringers 10 mL/hr at 08/27/16 1335  . lactated ringers       LOS: 7 days      Coralie Keens, MD [facility] Pager [phone]  If 7PM-7AM, please contact night-coverage [email or web address] Password [email or web address] 08/29/2016, 3:20 PM

## 2016-08-29 NOTE — Consult Note (Addendum)
Date of Admission:  08/22/2016  Date of Consult:  08/29/2016  Reason for Consult: Diabetic foot ulcers, with osteomyelitis in foot abscess Referring Physician: Dr. Cathlean Sauer   HPI: [name] Snow is an 51 y.o. male with history of uncontrolled type 2 diabetes mellitus with an A1c greater than 10, neuropathy chronic kidney disease hypertension hyperlipidemia. He had been seen by his primary care physician as not patient on March 1 prescribed Augmentin for what was thought to be a cellulitis the time. He presented to the hospitalist 6 of April with progressive erythema and edema of his foot with ulceration that was spontaneously draining. He was admitted to the hospitalist service on April 6. The admission H&P stated that he has SIRS but is not overtly septic, but he was started on of pain vancomycin and oral Flagyl by using the diabetic foot focused order set. Patient was seen by orthopedic surgery and then taken to the operating room on April 11th where Dr. Erlinda Hong performed   1. Irrigation and debridement of bone, muscle, subcutaneous tissue, tendon, skin of right foot 8 x 6 cm 2. Right foot fifth ray amputation 3. Application of wound VAC less than 50 cm  I DO NOT SEE THAT ANY OPERATIVE BONE OR PUS WAS SENT FOR CULTURE then  Patient was continued on broad-spectrum antibiotic with a bump in his serum creatinine up to 1.64. He was again taken the operating room on April 13 in which point in time he underwent   1. Sharp excisional debridement of bone, muscle, skin 8 x 10 cm 2. Application of Acell  3. Application wound VAC greater than 50 cm 4. Secondary closure of previous surgical incision and wound  I again DO NOT SEE ANY CULTURES were sent.    Past Medical History:  Diagnosis Date  . Arthritis    gout  . Cataract   . CHF (congestive heart failure) ([place])   . Chronic kidney disease   . Clotting disorder ([place])   . Diabetes mellitus   . Gout   . Hypertension     Past  Surgical History:  Procedure Laterality Date  . AMPUTATION Right 08/24/2016   Procedure: IRRIGATION AND DEBRIDEMENT OF RIGHT FOOT AND AMPUTATION OF RIGHT FIFTH RAY;  Surgeon: Leandrew Koyanagi, MD;  Location: [facility];  Service: Orthopedics;  Laterality: Right;  . I&D EXTREMITY Right 08/27/2016   Procedure: IRRIGATION AND DEBRIDEMENT RIGHT FOOT, VAC,  4TH RAY AMPUTATION;  Surgeon: Leandrew Koyanagi, MD;  Location: [facility];  Service: Orthopedics;  Laterality: Right;  . JOINT REPLACEMENT  [id number]   R Hip    Social History:  reports that he has never smoked. He has never used smokeless tobacco. He reports that he does not drink alcohol or use drugs.   Family History  Problem Relation Age of Onset  . Mental illness Mother   . Hypertension Mother   . Hyperlipidemia Mother   . Mental illness Father   . COPD Father   . Mental illness Brother     Allergies  Allergen Reactions  . No Known Allergies      Medications: I have reviewed patients current medications as documented in Epic Anti-infectives    Start     Dose/Rate Route Frequency Ordered Stop   08/29/16 2200  metroNIDAZOLE (FLAGYL) tablet 500 mg     500 mg Oral Every 8 hours 08/29/16 1636     08/29/16 2100  cefTRIAXone (ROCEPHIN) 2 g in dextrose 5 %  50 mL IVPB     2 g 100 mL/hr over 30 Minutes Intravenous Every 24 hours 08/29/16 1636     08/27/16 1645  ceFAZolin (ANCEF) IVPB 2g/100 mL premix  Status:  Discontinued     2 g 200 mL/hr over 30 Minutes Intravenous Every 6 hours 08/27/16 1635 08/27/16 1708   08/26/16 1800  vancomycin (VANCOCIN) IVPB 1000 mg/200 mL premix  Status:  Discontinued     1,000 mg 200 mL/hr over 60 Minutes Intravenous Every 12 hours 08/26/16 0619 08/29/16 1636   08/24/16 1030  ceFAZolin (ANCEF) IVPB 2g/100 mL premix  Status:  Discontinued     2 g 200 mL/hr over 30 Minutes Intravenous Every 6 hours 08/24/16 1017 08/24/16 1024   08/23/16 0600  vancomycin (VANCOCIN) 1,250 mg in sodium chloride 0.9 % 250 mL IVPB  Status:   Discontinued     1,250 mg 166.7 mL/hr over 90 Minutes Intravenous Every 24 hours 08/22/16 1959 08/26/16 0618   08/22/16 2300  ceFEPIme (MAXIPIME) 1 g in dextrose 5 % 50 mL IVPB  Status:  Discontinued     1 g 100 mL/hr over 30 Minutes Intravenous Every 8 hours 08/22/16 2136 08/29/16 1636   08/22/16 2200  metroNIDAZOLE (FLAGYL) tablet 500 mg  Status:  Discontinued     500 mg Oral Every 8 hours 08/22/16 2130 08/29/16 1636   08/22/16 1800  vancomycin (VANCOCIN) IVPB 1000 mg/200 mL premix     1,000 mg 200 mL/hr over 60 Minutes Intravenous NOW 08/22/16 1754 08/22/16 1942         ROS:  as in HPI otherwise remainder of 12 point Review of Systems is negative    Blood pressure 134/75, pulse 90, temperature 97.6 F (36.4 C), temperature source Oral, resp. rate 17, height 6' (1.829 m), weight 261 lb 3.2 oz (118.5 kg), SpO2 98 %. General: Alert and awake, oriented x3, not in any acute distress. HEENT: anicteric sclera,  EOMI,  Cardiovascular: regular rate, normal r,   Pulmonary: no wheezing, Or respiratory distress Gastrointestinal: soft  nondistended, normal bowel sounds, Musculoskeletal: no  clubbing or edema noted bilaterally Skin, soft tissue:   His left foot is without any diabetic foot ulcer  Right foot 08/29/2016:       Neuro: nonfocal, strength and sensation intact   Results for orders placed or performed during the hospital encounter of 08/22/16 (from the past 48 hour(s))  Glucose, capillary     Status: Abnormal   Collection Time: 08/27/16  8:24 PM  Result Value Ref Range   Glucose-Capillary 112 (H) 65 - 99 mg/dL  Glucose, capillary     Status: Abnormal   Collection Time: 08/27/16 11:39 PM  Result Value Ref Range   Glucose-Capillary 232 (H) 65 - 99 mg/dL  Glucose, capillary     Status: Abnormal   Collection Time: 08/28/16  4:46 AM  Result Value Ref Range   Glucose-Capillary 124 (H) 65 - 99 mg/dL  CBC     Status: Abnormal   Collection Time: 08/28/16  5:47 AM    Result Value Ref Range   WBC 13.0 (H) 4.0 - 10.5 K/uL   RBC 3.59 (L) 4.22 - 5.81 MIL/uL   Hemoglobin 9.8 (L) 13.0 - 17.0 g/dL   HCT 31.1 (L) 39.0 - 52.0 %   MCV 86.6 78.0 - 100.0 fL   MCH 27.3 26.0 - 34.0 pg   MCHC 31.5 30.0 - 36.0 g/dL   RDW 13.1 11.5 - 15.5 %   Platelets 375 150 -  400 K/uL  Basic metabolic panel     Status: Abnormal   Collection Time: 08/28/16  5:47 AM  Result Value Ref Range   Sodium 136 135 - 145 mmol/L   Potassium 4.5 3.5 - 5.1 mmol/L   Chloride 98 (L) 101 - 111 mmol/L   CO2 26 22 - 32 mmol/L   Glucose, Bld 114 (H) 65 - 99 mg/dL   BUN 22 (H) 6 - 20 mg/dL   Creatinine, Ser 1.15 0.61 - 1.24 mg/dL   Calcium 8.8 (L) 8.9 - 10.3 mg/dL   GFR calc non Af Amer >60 >60 mL/min   GFR calc Af Amer >60 >60 mL/min    Comment: (NOTE) The eGFR has been calculated using the CKD EPI equation. This calculation has not been validated in all clinical situations. eGFR's persistently <60 mL/min signify possible Chronic Kidney Disease.    Anion gap 12 5 - 15  Vancomycin, trough     Status: None   Collection Time: 08/28/16  5:47 AM  Result Value Ref Range   Vancomycin Tr 18 15 - 20 ug/mL  Glucose, capillary     Status: Abnormal   Collection Time: 08/28/16  9:37 AM  Result Value Ref Range   Glucose-Capillary 243 (H) 65 - 99 mg/dL  Glucose, capillary     Status: Abnormal   Collection Time: 08/28/16 12:37 PM  Result Value Ref Range   Glucose-Capillary 179 (H) 65 - 99 mg/dL  Glucose, capillary     Status: Abnormal   Collection Time: 08/28/16  4:21 PM  Result Value Ref Range   Glucose-Capillary 218 (H) 65 - 99 mg/dL   Comment 1 Notify RN    Comment 2 Document in Chart   Glucose, capillary     Status: Abnormal   Collection Time: 08/28/16  8:13 PM  Result Value Ref Range   Glucose-Capillary 175 (H) 65 - 99 mg/dL  Glucose, capillary     Status: Abnormal   Collection Time: 08/28/16 11:52 PM  Result Value Ref Range   Glucose-Capillary 145 (H) 65 - 99 mg/dL  Glucose,  capillary     Status: Abnormal   Collection Time: 08/29/16  3:44 AM  Result Value Ref Range   Glucose-Capillary 143 (H) 65 - 99 mg/dL  CBC with Differential/Platelet     Status: Abnormal   Collection Time: 08/29/16  4:23 AM  Result Value Ref Range   WBC 12.3 (H) 4.0 - 10.5 K/uL   RBC 3.59 (L) 4.22 - 5.81 MIL/uL   Hemoglobin 9.7 (L) 13.0 - 17.0 g/dL   HCT 31.3 (L) 39.0 - 52.0 %   MCV 87.2 78.0 - 100.0 fL   MCH 27.0 26.0 - 34.0 pg   MCHC 31.0 30.0 - 36.0 g/dL   RDW 13.6 11.5 - 15.5 %   Platelets 346 150 - 400 K/uL   Neutrophils Relative % 60 %   Neutro Abs 7.4 1.7 - 7.7 K/uL   Lymphocytes Relative 32 %   Lymphs Abs 3.9 0.7 - 4.0 K/uL   Monocytes Relative 6 %   Monocytes Absolute 0.8 0.1 - 1.0 K/uL   Eosinophils Relative 2 %   Eosinophils Absolute 0.2 0.0 - 0.7 K/uL   Basophils Relative 0 %   Basophils Absolute 0.0 0.0 - 0.1 K/uL  Basic metabolic panel     Status: Abnormal   Collection Time: 08/29/16  4:23 AM  Result Value Ref Range   Sodium 135 135 - 145 mmol/L   Potassium 4.7 3.5 -  5.1 mmol/L   Chloride 99 (L) 101 - 111 mmol/L   CO2 28 22 - 32 mmol/L   Glucose, Bld 138 (H) 65 - 99 mg/dL   BUN 23 (H) 6 - 20 mg/dL   Creatinine, Ser 1.26 (H) 0.61 - 1.24 mg/dL   Calcium 8.4 (L) 8.9 - 10.3 mg/dL   GFR calc non Af Amer >60 >60 mL/min   GFR calc Af Amer >60 >60 mL/min    Comment: (NOTE) The eGFR has been calculated using the CKD EPI equation. This calculation has not been validated in all clinical situations. eGFR's persistently <60 mL/min signify possible Chronic Kidney Disease.    Anion gap 8 5 - 15  Glucose, capillary     Status: Abnormal   Collection Time: 08/29/16  8:12 AM  Result Value Ref Range   Glucose-Capillary 149 (H) 65 - 99 mg/dL  Glucose, capillary     Status: Abnormal   Collection Time: 08/29/16 12:13 PM  Result Value Ref Range   Glucose-Capillary 188 (H) 65 - 99 mg/dL  Glucose, capillary     Status: Abnormal   Collection Time: 08/29/16  3:00 PM  Result  Value Ref Range   Glucose-Capillary 148 (H) 65 - 99 mg/dL   '@BRIEFLABTABLE'$ (sdes,specrequest,cult,reptstatus)   ) Recent Results (from the past 720 hour(s))  Culture, blood (routine x 2)     Status: None   Collection Time: 08/22/16 10:12 PM  Result Value Ref Range Status   Specimen Description BLOOD RIGHT ANTECUBITAL  Final   Special Requests   Final    BOTTLES DRAWN AEROBIC ONLY Blood Culture adequate volume   Culture   Final    NO GROWTH 5 DAYS Performed at [facility], [address] [place], [place] [address]    Report Status 08/28/2016 FINAL  Final  Culture, blood (routine x 2)     Status: None   Collection Time: 08/22/16 10:32 PM  Result Value Ref Range Status   Specimen Description BLOOD RIGHT ANTECUBITAL  Final   Special Requests   Final    BOTTLES DRAWN AEROBIC ONLY Blood Culture adequate volume   Culture   Final    NO GROWTH 5 DAYS Performed at [facility], [place] [address], [place], [place] [address]    Report Status 08/28/2016 FINAL  Final  Surgical pcr screen     Status: None   Collection Time: 08/23/16 11:29 AM  Result Value Ref Range Status   MRSA, PCR NEGATIVE NEGATIVE Final   Staphylococcus aureus NEGATIVE NEGATIVE Final    Comment:        The Xpert SA Assay (FDA approved for NASAL specimens in patients over 57 years of age), is one component of a comprehensive surveillance program.  Test performance has been validated by [facility] for patients greater than or equal to 50 year old. It is not intended to diagnose infection nor to guide or monitor treatment.      Impression/Recommendation  Principal Problem:   Diabetic foot infection ([place]) Active Problems:   DM type 2, uncontrolled, with neuropathy ([place])   Gout   HTN (hypertension)   AKI (acute kidney injury) ([place])   SIRS (systemic inflammatory response syndrome) (HCC)   Leukocytosis   Acute osteomyelitis of metatarsal bone, right (HCC)   [name] Snow is a 51 y.o. male  with poorly controlled diabetes mellitus chronic kidney disease and diabetic foot ulcer with osteomyelitis status post fourth and fifth ray amputation, irrigation and debridement of bone  muscle and skin in a patient of wound vacuum.   #1 Osteomyelitis in patient with DFU: The only culture date I have available to me is from some wound cultures in the past year where the patient grew group G streptococcus in July 2017 along with methicillin sensitive staphylococcus aureus.  We will narrow the patient to ceftriaxone to cover for MS Staphylococcus aureus and Streptococcal species and continue oral flagyl for covering anerobes  IN FUTURE I WOULD GREATLY APPRECIATE IF OPERATIVE CULTURES WERE SENT TO HELP ME MAKE RATIONAL TARGETED ANTIMICROBIAL DECISIONS.  THIS WOULD SEEM TO ME TO BE THE LEAST COMPLICATED PART OF A SURGERY AND I AM STRUGGLING TO UNDERSTAND WHY THIS IS NOT BEING DONE.  IS THERE A STRUCTURAL PROBLEM WITH SPECIMENS BEING SENT TO THE LAB?   CERTAINLY CULTURES CAN BE COMPROMISED BY ANTECEDENT ABX BUT ORGANISMS STILL MAY GROW, FURTHER MORE THE PRIMARY TEAM    NOT GIVING IV OR PO ANTIBIOTICS UNTIL WE HAVE DEEP INTRA-OPERATIVE CULTURES HAVE BEEN OBTAINED CAN AID YIELD IN THIS PROCESS  I HAVE SPELLED THIS ALL OUT IN THE DIABETIC FOOT FOCUSED ORDER SET  I will plan on setting him up with 4 weeks of IV Ceftriaxone 2 grams daily with oral flagyl '500mg'$  TID  IV abx plan as follows:  Diagnosis: Diabetic foot ulcer with osteomyelitis in foot abscess  Culture Result: None performed  Allergies  Allergen Reactions  . No Known Allergies     Discharge antibiotics: Ceftriaxone 2 grams IV daily and oral flagyl '500mg'$  tid  Duration:  4 weeks  End Date:  May 10th  [facility] Care Per Protocol:  Labs weekly while on IV antibiotics: _x_ CBC with differential _x_ BMP  x_ CRP _x_ ESR   _x_ Please pull PIC at completion of IV antibiotics __ Please leave PIC in place until doctor has seen  patient or been notified  Fax weekly labs to [phone]  Clinic Follow Up Appt:  Next 3 weeks  Thank you so much for this interesting consult   I will arrange for HSFU with [place] in [facility] in the next few weeks.   I will sign off for now.  Please call with further questions.     08/29/2016, 7:32 [place] [facility] [place] [phone] (pager) [phone] (office) 08/29/2016, 7:32 PM  Rhina [place] 08/29/2016, 7:32 PM

## 2016-08-29 NOTE — Care Management Note (Signed)
Case Management Note  Patient Details  Name: MARGARITO DEHAAS MRN: [id number] Date of Birth: [birth year]  Subjective/Objective:    51 yr old male s/p  Right foot wound status post fourth and fifth ray amputation. Patient went to OR today for: 1. Sharp excisional debridement of bone, muscle, skin 8 x 10 cm 2. Application of Acell  3. Application wound VAC greater than 50 cm 4. Secondary closure of previous surgical incision and wound         Action/Plan: Case manager spoke with patient previously concerning home health needs and Need for wound vac. Choice for Home Health agency was [facility]. CM has called referral to Janeice Robinson, [facility] Liaison.  Order for wound vac has been faced to [employer], [employer]. Patient states he and his wife are separated at this time and he will go home alone with some assistance from her. Should this change he may need shortterm rehab. Patient had PICC line placed for IV antibiotics, CM will continue to monitor for orders for antibiotics.    Expected Discharge Date:   (unknown)               Expected Discharge Plan:   Home with Home Health  In-House Referral:     Discharge planning Services  CM Consult  Post Acute Care Choice:  Durable Medical Equipment, Home Health Choice offered to:  Patient  DME Arranged:  Vac DME Agency:  [facility]  HH Arranged:  RN, PT HH Agency:  [facility]  Status of Service:  In process, will continue to follow  If discussed at Long Length of Stay Meetings, dates discussed:    Additional Comments:  Durenda Guthrie, RN 08/29/2016, 3:48 PM

## 2016-08-29 NOTE — Care Management Important Message (Signed)
Important Message  Patient Details  Name: [name] Snow MRN: [id number] Date of Birth: Jun 05, 1965   Medicare Important Message Given:  Yes    Lasundra Hascall [place] 08/29/2016, 2:41 PM

## 2016-08-29 NOTE — Op Note (Signed)
   Date of Surgery: 08/29/2016  INDICATIONS: Mr. Krabill is a 51 y.o.-year-old male with a right foot wound status post multiple debridements;  The patient did consent to the procedure after discussion of the risks and benefits.  PREOPERATIVE DIAGNOSIS: Right foot wound status post fourth and fifth ray amputation  POSTOPERATIVE DIAGNOSIS: Same.  PROCEDURE:  1. Sharp excisional debridement of bone, muscle, skin 8 x 10 cm 2. Application of Acell  3. Application wound VAC greater than 50 cm 4. Secondary closure of previous surgical incision and wound  SURGEON: N. Glee Arvin, M.D.  ASSIST: Patrick Jupiter, RNFAgeneral.  ANESTHESIA:  general  IV FLUIDS AND URINE: See anesthesia.  ESTIMATED BLOOD LOSS: Minimal mL.  IMPLANTS: None  DRAINS: Wound VAC  COMPLICATIONS: None.  DESCRIPTION OF PROCEDURE: The patient was brought to the operating room and placed supine on the operating table.  The patient had been signed prior to the procedure and this was documented. The patient had the anesthesia placed by the anesthesiologist.  A time-out was performed to confirm that this was the correct patient, site, side and location. The patient did receive antibiotics prior to the incision and was re-dosed during the procedure as needed at indicated intervals.  A tourniquet was not placed.  The patient had the operative extremity prepped and draped in the standard surgical fashion.   The wound was inspected carefully and demonstrated healthy granulation tissue with exposed fourth and fifth metatarsal osteotomy sites.  I opened up part of the previous surgical incision proximally to examine the tissues. There was no evidence of gross infection. I performed gentle sharp excisional debridement of the osteotomy sites of the fourth and fifth metatarsal, interosseous muscles, skin with a curet. All soft tissues exhibited good bleeding surfaces. I then performed a secondary closure of the proximal end of the surgical  wound with a interrupted 2-0 nylon. This was then thoroughly irrigated with pulse lavage. I then applied the Acell powder throughout the wound which was then covered with the Acell sheet.  The sheet was then fixed to the wound using a running 3-0 Monocryl. Adaptic was then placed on top of the sheet in the incision. A wound VAC was then placed on the wound and set to -100 mmHg.  Patient tolerated the procedure well and had no immediate complications.  POSTOPERATIVE PLAN: The patient will be discharged home with home back. I would like to bring him back in a week back to the operating room for a repeat washout. He is to be nonweightbearing to the right lower extremity in a Cam Walker.  Mayra Reel, MD [facility] [phone] 2:50 PM

## 2016-08-30 DIAGNOSIS — M868X7 Other osteomyelitis, ankle and foot: Secondary | ICD-10-CM

## 2016-08-30 DIAGNOSIS — E11628 Type 2 diabetes mellitus with other skin complications: Secondary | ICD-10-CM

## 2016-08-30 LAB — BASIC METABOLIC PANEL
Anion gap: 8 (ref 5–15)
BUN: 26 mg/dL — ABNORMAL HIGH (ref 6–20)
CALCIUM: 8.3 mg/dL — AB (ref 8.9–10.3)
CHLORIDE: 98 mmol/L — AB (ref 101–111)
CO2: 28 mmol/L (ref 22–32)
CREATININE: 1.48 mg/dL — AB (ref 0.61–1.24)
GFR calc non Af Amer: 53 mL/min — ABNORMAL LOW (ref >60)
Glucose, Bld: 171 mg/dL — ABNORMAL HIGH (ref 65–99)
Potassium: 4.7 mmol/L (ref 3.5–5.1)
SODIUM: 134 mmol/L — AB (ref 135–145)

## 2016-08-30 LAB — CBC WITH DIFFERENTIAL/PLATELET
BASOS PCT: 0 %
Basophils Absolute: 0.1 10*3/uL (ref 0.0–0.1)
EOS ABS: 0.2 10*3/uL (ref 0.0–0.7)
Eosinophils Relative: 1 %
HCT: 28.5 % — ABNORMAL LOW (ref 39.0–52.0)
HEMOGLOBIN: 8.8 g/dL — AB (ref 13.0–17.0)
Lymphocytes Relative: 25 %
Lymphs Abs: 3.8 10*3/uL (ref 0.7–4.0)
MCH: 27.2 pg (ref 26.0–34.0)
MCHC: 30.9 g/dL (ref 30.0–36.0)
MCV: 88.2 fL (ref 78.0–100.0)
MONOS PCT: 7 %
Monocytes Absolute: 1 10*3/uL (ref 0.1–1.0)
NEUTROS PCT: 66 %
Neutro Abs: 10 10*3/uL — ABNORMAL HIGH (ref 1.7–7.7)
Platelets: 362 10*3/uL (ref 150–400)
RBC: 3.23 MIL/uL — ABNORMAL LOW (ref 4.22–5.81)
RDW: 13.4 % (ref 11.5–15.5)
WBC: 15.1 10*3/uL — ABNORMAL HIGH (ref 4.0–10.5)

## 2016-08-30 LAB — GLUCOSE, CAPILLARY
GLUCOSE-CAPILLARY: 161 mg/dL — AB (ref 65–99)
GLUCOSE-CAPILLARY: 180 mg/dL — AB (ref 65–99)
Glucose-Capillary: 141 mg/dL — ABNORMAL HIGH (ref 65–99)
Glucose-Capillary: 193 mg/dL — ABNORMAL HIGH (ref 65–99)
Glucose-Capillary: 202 mg/dL — ABNORMAL HIGH (ref 65–99)

## 2016-08-30 MED ORDER — PREDNISONE 10 MG PO TABS
10.0000 mg | ORAL_TABLET | Freq: Every day | ORAL | Status: DC
  Administered 2016-08-30 – 2016-08-31 (×2): 10 mg via ORAL
  Filled 2016-08-30 (×2): qty 1

## 2016-08-30 MED ORDER — INSULIN ASPART 100 UNIT/ML ~~LOC~~ SOLN
0.0000 [IU] | Freq: Three times a day (TID) | SUBCUTANEOUS | Status: DC
  Administered 2016-08-31 (×2): 2 [IU] via SUBCUTANEOUS

## 2016-08-30 MED ORDER — COLCHICINE 0.6 MG PO TABS: 0.6000 mg | ORAL_TABLET | Freq: Three times a day (TID) | ORAL | Status: DC | PRN

## 2016-08-30 NOTE — Progress Notes (Signed)
Physical Therapy Treatment Patient Details Name: [name] Snow MRN: [id number] DOB: 08-Jul-1965 Today's Date: 08/30/2016    History of Present Illness 51 year old male with a past medical history of uncontrolled type 2 diabetes complicated by neuropathy and CKD 3, HTN, HLD, gout, and chronic pain who has had issues with diabetic foot ulcer involving the right foot since March.  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 Instructed pt on properly donning and doffing cam boot. Pt reports increased comfort with Cam boot in place. Pt will benefit from HHPT at discharge in order to negotiate home. Pt may require an additional visit before DC to address stair negotiation.     Follow Up Recommendations  Home health PT     Equipment Recommendations  Rolling walker with 5" wheels;3in1 (PT)    Recommendations for Other Services       Precautions / Restrictions Precautions Precautions: Fall Precaution Comments: wound vac Required Braces or Orthoses: Other Brace/Splint Other Brace/Splint: Cam Walker Restrictions Weight Bearing Restrictions: Yes RLE Weight Bearing: Non weight bearing    Mobility  Bed Mobility Overal bed mobility: Needs Assistance Bed Mobility: Supine to Sit     Supine to sit: HOB elevated;Supervision     General bed mobility comments: pt. states he does not have bed rail at home.  advised not using it to make sure he can complete bed mobility, pt. used the rail anyway  Transfers Overall transfer level: Needs assistance Equipment used: Rolling walker (2 wheeled) Transfers: Sit to/from Stand Sit to Stand: Supervision         General transfer comment: Verbal  cues for hand placement and to maintain NWB with Cam walker in place.   Ambulation/Gait Ambulation/Gait assistance: Min guard Ambulation Distance (Feet): 30 Feet Assistive device: Rolling walker (2 wheeled) Gait Pattern/deviations: Step-to pattern Gait velocity: decreased Gait velocity interpretation: Below normal speed for age/gender General Gait Details: Pt did well maintaing NWB RLE with Cam boot in place.    Stairs            Wheelchair Mobility    Modified Rankin (Stroke Patients Only)       Balance                                            Cognition Arousal/Alertness: Awake/alert Behavior During Therapy: WFL for tasks assessed/performed Overall Cognitive Status: Within Functional Limits for tasks assessed                                        Exercises      General Comments General comments (skin integrity, edema, etc.): Instructed pt on how to don and doff cam boot.       Pertinent Vitals/Pain Pain Assessment: Faces Faces Pain Scale: Hurts a little bit Pain Location: L LE-reports LE has gout and it extends into his knee Pain Descriptors / Indicators: Aching;Throbbing Pain Intervention(s): Monitored during session    Home Living  Prior Function            PT Goals (current goals can now be found in the care plan section) Acute Rehab PT Goals Patient Stated Goal: to get back to walking Progress towards PT goals: Progressing toward goals    Frequency    Min 3X/week      PT Plan Current plan remains appropriate    Co-evaluation             End of Session Equipment Utilized During Treatment: Gait belt Activity Tolerance: Patient tolerated treatment well Patient left: in bed;with call bell/phone within reach Nurse Communication: Mobility status PT Visit Diagnosis: Other abnormalities of gait and mobility (R26.89)     Time: 1421-1436 PT Time Calculation (min)  (ACUTE ONLY): 15 min  Charges:  $Therapeutic Activity: 8-22 mins                    G Codes:       Colin Broach PT, DPT  [phone]    Ruel Favors Aletha Halim 08/30/2016, 2:42 PM

## 2016-08-30 NOTE — Progress Notes (Signed)
Patient is stable this morning s/p I&D and Acell placement.  Appreciate ID assistance with abx treatment.  From my stand point, the patient may be discharged once home vac unit and abx have been arranged.  My office will contact him about repeat I&D next Thursday.  All this was communicated to the patient today.  Mayra Reel, MD [facility] [phone] 8:58 AM

## 2016-08-30 NOTE — Progress Notes (Signed)
PROGRESS NOTE    [name] Snow  ZOX:[id number] DOB: 1965-10-13 DOA: 08/22/2016 PCP: Virgilio Belling    Brief Narrative:  51 yo male with t2dm, presents with worsening right foot infection. Had failed outpatient antibiotic therapy. Right foot with worsening edema, erythema and ulceration with spontaneous drainage. Evaluated at the wound care clinic and referred for admission. On admission temperature 100.2, 3 to 4 cm ulcerated wound at the right foot. Admitted for antibiotic therapy and surgical consultation. Underwent right ray amputation. PICC line in place, ID consulted. Antibiotics change to ceftriaxone. Waiting for wound vac arrangements before discharge.    Assessment & Plan:   Principal Problem:   Diabetic foot infection (HCC) Active Problems:   DM type 2, uncontrolled, with neuropathy (HCC)   Gout   HTN (hypertension)   AKI (acute kidney injury) (HCC)   SIRS (systemic inflammatory response syndrome) (HCC)   Leukocytosis   Acute osteomyelitis of metatarsal bone, right (HCC)   Cellulitis of right foot   Osteomyelitis of foot, right, acute (HCC)    1. Right foot abscess and osteomyelitis. .Wound vac in place with good toleration, social services consult for wound care and Vac as outpatient. Antibiotics de-escalated to ceftriaxone and oral metronidazole based on old cultures, positive for mssa and streptococcus with plan to continue antibiotic therapy for 4 weeks.   2. T2DM. Continue glucose cover and monitoring. Patient tolerating po well, back on basal insulin regimen of 35 units bid, capillary glucose 198, 161, 141, 193.   3. AKI on CKD stage 3. Cr at 1,4, with K at 4,7, will continue to follow on renal function and electrolytes. Antibiotic therapy has been changed to ceftriaxone, last vanc level at 18 on 08/28/16.   4. Dyslipidemia. Continue statin therapy, per home regimen.   5. Chronic pain syndrome. On gabapentin and amitriptyline plusacetaminophen and oxycodone  with good pain control.     DVT prophylaxis:enoxaparin  Code Status:full  Family Communication:No family at the bedside Disposition Plan:home    Consultants:  Orthopedics   Procedures:  Right ray amputation  Antimicrobials:   Ceftriaxone     Subjective: Patient with pain controlled on his right foot, no nausea or vomiting, tolerating po well. Picc line with mils oozing of blood at the entry site. No edema or tenderness.   Objective: Vitals:   08/29/16 1531 08/29/16 1548 08/29/16 2105 08/30/16 0445  BP:  134/75 (!) 125/58 (!) 112/53  Pulse: 64 90 74 67  Resp: Temp: 97.9 F (36.6 C) 97.6 F (36.4 C) 99.2 F (37.3 C) 99.4 F (37.4 C)  TempSrc:  Oral Oral Oral  SpO2: 98% 98% 99% 95%  Weight:      Height:        Intake/Output Summary (Last 24 hours) at 08/30/16 1111 Last data filed at 08/30/16 0823  Gross per 24 hour  Intake          6670.75 ml  Output             1020 ml  Net          5650.75 ml   Filed Weights   08/22/16 1639 08/22/16 2141  Weight: 116.1 kg (256 lb) 118.5 kg (261 lb 3.2 oz)    Examination:  General exam: not in pain or dyspnea E ENT: no pallor or icterus, oral mucosa moist.  Respiratory system: Clear to auscultation. Respiratory effort normal. No wheezing, rales or rhonchi.  Cardiovascular system: S1 & S2 heard, RRR. No JVD,  murmurs, rubs, gallops or clicks. No pedal edema. Gastrointestinal system: Abdomen is nondistended, soft and nontender. No organomegaly or masses felt. Normal bowel sounds heard. Central nervous system: Alert and oriented. No focal neurological deficits. Extremities: Symmetric 5 x 5 power. Skin: surgical wound on right foot. Wound vac in place.      Data Reviewed: I have personally reviewed following labs and imaging studies  CBC:  Recent Labs Lab 08/26/16 0510 08/27/16 1649 08/28/16 0547 08/29/16 0423 08/30/16 0423  WBC 12.0* 12.6* 13.0* 12.3* 15.1*  NEUTROABS  --   --   --   7.4 10.0*  HGB 9.8* 11.0* 9.8* 9.7* 8.8*  HCT 30.2* 34.1* 31.1* 31.3* 28.5*  MCV 84.8 86.5 86.6 87.2 88.2  PLT 334 413* 375 346 362   Basic Metabolic Panel:  Recent Labs Lab 08/25/16 0439 08/26/16 0510 08/27/16 1649 08/28/16 0547 08/29/16 0423 08/30/16 0423  NA 138 137  --  136 135 134*  K 4.2 4.4  --  4.5 4.7 4.7  CL 107 103  --  98* 99* 98*  CO2 26 24  --  GLUCOSE 148* 210*  --  114* 138* 171*  BUN 26* 30*  --  22* 23* 26*  CREATININE 1.21 1.34* 1.21 1.15 1.26* 1.48*  CALCIUM 8.4* 9.0  --  8.8* 8.4* 8.3*   GFR: Estimated Creatinine Clearance: 77.6 mL/min (A) (by C-G formula based on SCr of 1.48 mg/dL (H)). Liver Function Tests:  Recent Labs Lab 08/24/16 0415  AST 13*  ALT 9*  ALKPHOS 73  BILITOT 0.7  PROT 7.7  ALBUMIN 3.0*   No results for input(s): LIPASE, AMYLASE in the last 168 hours. No results for input(s): AMMONIA in the last 168 hours. Coagulation Profile: No results for input(s): INR, PROTIME in the last 168 hours. Cardiac Enzymes: No results for input(s): CKTOTAL, CKMB, CKMBINDEX, TROPONINI in the last 168 hours. BNP (last 3 results) No results for input(s): PROBNP in the last 8760 hours. HbA1C: No results for input(s): HGBA1C in the last 72 hours. CBG:  Recent Labs Lab 08/29/16 1500 08/29/16 2059 08/29/16 2349 08/30/16 0409 08/30/16 0800  GLUCAP 148* 197* 198* 161* 141*   Lipid Profile: No results for input(s): CHOL, HDL, LDLCALC, TRIG, CHOLHDL, LDLDIRECT in the last 72 hours. Thyroid Function Tests: No results for input(s): TSH, T4TOTAL, FREET4, T3FREE, THYROIDAB in the last 72 hours. Anemia Panel: No results for input(s): VITAMINB12, FOLATE, FERRITIN, TIBC, IRON, RETICCTPCT in the last 72 hours. Sepsis Labs: No results for input(s): PROCALCITON, LATICACIDVEN in the last 168 hours.  Recent Results (from the past 240 hour(s))  Culture, blood (routine x 2)     Status: None   Collection Time: 08/22/16 10:12 PM  Result Value  Ref Range Status   Specimen Description BLOOD RIGHT ANTECUBITAL  Final   Special Requests   Final    BOTTLES DRAWN AEROBIC ONLY Blood Culture adequate volume   Culture   Final    NO GROWTH 5 DAYS Performed at [facility], [address] [place], [place] [address]    Report Status 08/28/2016 FINAL  Final  Culture, blood (routine x 2)     Status: None   Collection Time: 08/22/16 10:32 PM  Result Value Ref Range Status   Specimen Description BLOOD RIGHT ANTECUBITAL  Final   Special Requests   Final    BOTTLES DRAWN AEROBIC ONLY Blood Culture adequate volume   Culture   Final    NO GROWTH  5 DAYS Performed at [facility], [address] [place], [place] [address]    Report Status 08/28/2016 FINAL  Final  Surgical pcr screen     Status: None   Collection Time: 08/23/16 11:29 AM  Result Value Ref Range Status   MRSA, PCR NEGATIVE NEGATIVE Final   Staphylococcus aureus NEGATIVE NEGATIVE Final    Comment:        The Xpert SA Assay (FDA approved for NASAL specimens in patients over 63 years of age), is one component of a comprehensive surveillance program.  Test performance has been validated by [facility] for patients greater than or equal to 56 year old. It is not intended to diagnose infection nor to guide or monitor treatment.          Radiology Studies: No results found.      Scheduled Meds: . allopurinol  300 mg Oral Daily  . amitriptyline  100 mg Oral QHS  . cefTRIAXone (ROCEPHIN)  IV  2 g Intravenous Q24H  . gabapentin  1,200 mg Oral TID  . insulin aspart  0-9 Units Subcutaneous Q4H  . insulin glargine  35 Units Subcutaneous BID  . metroNIDAZOLE  500 mg Oral Q8H  . multivitamin with minerals  1 tablet Oral Daily  . protein supplement shake  11 oz Oral BID BM  . rosuvastatin  20 mg Oral Daily   Continuous Infusions: . sodium chloride 125 mL/hr at 08/30/16 0606  . lactated ringers 10 mL/hr at 08/27/16 1335  . lactated ringers        LOS: 8 days       Coralie Keens, MD [facility] Pager [phone]  If 7PM-7AM, please contact night-coverage [email or web address] Password [email or web address] 08/30/2016, 11:11 AM

## 2016-08-30 NOTE — Progress Notes (Signed)
Occupational Therapy Treatment Patient Details Name: [name] Snow MRN: [id number] DOB: 04-05-1966 Today's Date: 08/30/2016    History of present illness 51 year old male with a past medical history of uncontrolled type 2 diabetes complicated by neuropathy and CKD 3, HTN, HLD, gout, and chronic pain who has had issues with diabetic foot ulcer involving the right foot since March.  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 Making gains with skilled OT.  Able to complete bed mobility and in room ambulation.  Eager for d/c home.  States wife can assist as needed.    Follow Up Recommendations  No OT follow up    Equipment Recommendations  3 in 1 bedside commode    Recommendations for Other Services      Precautions / Restrictions Precautions Precautions: Fall Precaution Comments: wound vac Restrictions RLE Weight Bearing: Non weight bearing       Mobility Bed Mobility Overal bed mobility: Needs Assistance Bed Mobility: Supine to Sit     Supine to sit: HOB elevated;Supervision     General bed mobility comments: pt. states he does not have bed rail at home.  advised not using it to make sure he can complete bed mobility, pt. used the rail anyway  Transfers Overall transfer level: Needs assistance Equipment used: Rolling walker (2 wheeled) Transfers: Sit to/from [employer] Sit to Stand: Supervision         General transfer comment: verbal cues for hand placement and maintaining NWB, more of a rocking motion to transition into standing as he reports his R elbow is also now bothering him.  bothered by use of gait belt but told him i have to use it    Balance                                           ADL either performed or assessed with clinical  judgement   ADL Overall ADL's : Needs assistance/impaired                         Toilet Transfer: Supervision/safety;RW;Ambulation Statistician Details (indicate cue type and reason): simulated with amb. in room around the bed to the recliner.  pt. declined need for actual b.room use Toileting- Architect and Hygiene: Min guard;Sit to/from stand Toileting - Clothing Manipulation Details (indicate cue type and reason): simulated     Functional mobility during ADLs: Supervision/safety;Rolling walker General ADL Comments: pt. reports his wife who he is separated from is still assisting as needed.  reports entire first level of home is equipped with DME and modifications as he was taking care of his mother there when she was sick.      Vision       Perception     Praxis      Cognition Arousal/Alertness: Awake/alert Behavior During Therapy: WFL for tasks assessed/performed Overall Cognitive Status: Within Functional Limits for tasks assessed                                          Exercises  Shoulder Instructions       General Comments      Pertinent Vitals/ Pain       Pain Assessment:  (did not rate but c/o LLE pain) Pain Location: L LE-reports LE has gout and it extends into his knee Pain Descriptors / Indicators: Aching;Throbbing Pain Intervention(s): Limited activity within patient's tolerance;Repositioned  Home Living                                          Prior Functioning/Environment              Frequency  Min 2X/week        Progress Toward Goals  OT Goals(current goals can now be found in the care plan section)  Progress towards OT goals: Progressing toward goals     Plan Discharge plan remains appropriate    Co-evaluation                 End of Session Equipment Utilized During Treatment: Gait belt;Rolling walker  OT Visit Diagnosis: Unsteadiness on feet  (R26.81);Other abnormalities of gait and mobility (R26.89)   Activity Tolerance Patient tolerated treatment well   Patient Left in chair;with call bell/phone within reach   Nurse Communication Other (comment) (spoke with rn regarding pts. request to know an exact time of his d/c so he can coordiate transportation)        Time: 0950-1005 OT Time Calculation (min): 15 min  Charges: OT Treatments $Self Care/Home Management : 8-22 mins   Robet Leu, COTA/L 08/30/2016, 10:24 AM

## 2016-08-31 DIAGNOSIS — I129 Hypertensive chronic kidney disease with stage 1 through stage 4 chronic kidney disease, or unspecified chronic kidney disease: Secondary | ICD-10-CM | POA: Diagnosis not present

## 2016-08-31 DIAGNOSIS — G894 Chronic pain syndrome: Secondary | ICD-10-CM | POA: Diagnosis not present

## 2016-08-31 DIAGNOSIS — E1122 Type 2 diabetes mellitus with diabetic chronic kidney disease: Secondary | ICD-10-CM | POA: Diagnosis not present

## 2016-08-31 DIAGNOSIS — N183 Chronic kidney disease, stage 3 (moderate): Secondary | ICD-10-CM | POA: Diagnosis not present

## 2016-08-31 DIAGNOSIS — Z4781 Encounter for orthopedic aftercare following surgical amputation: Secondary | ICD-10-CM | POA: Diagnosis not present

## 2016-08-31 DIAGNOSIS — M869 Osteomyelitis, unspecified: Secondary | ICD-10-CM

## 2016-08-31 DIAGNOSIS — M86671 Other chronic osteomyelitis, right ankle and foot: Secondary | ICD-10-CM | POA: Diagnosis not present

## 2016-08-31 DIAGNOSIS — E1142 Type 2 diabetes mellitus with diabetic polyneuropathy: Secondary | ICD-10-CM

## 2016-08-31 DIAGNOSIS — Z5181 Encounter for therapeutic drug level monitoring: Secondary | ICD-10-CM | POA: Diagnosis not present

## 2016-08-31 DIAGNOSIS — Z452 Encounter for adjustment and management of vascular access device: Secondary | ICD-10-CM | POA: Diagnosis not present

## 2016-08-31 DIAGNOSIS — Z89421 Acquired absence of other right toe(s): Secondary | ICD-10-CM | POA: Diagnosis not present

## 2016-08-31 DIAGNOSIS — Z792 Long term (current) use of antibiotics: Secondary | ICD-10-CM | POA: Diagnosis not present

## 2016-08-31 LAB — CBC WITH DIFFERENTIAL/PLATELET
Basophils Absolute: 0 10*3/uL (ref 0.0–0.1)
Basophils Relative: 0 %
EOS PCT: 1 %
Eosinophils Absolute: 0.1 10*3/uL (ref 0.0–0.7)
HCT: 26.3 % — ABNORMAL LOW (ref 39.0–52.0)
Hemoglobin: 8.5 g/dL — ABNORMAL LOW (ref 13.0–17.0)
LYMPHS ABS: 2.6 10*3/uL (ref 0.7–4.0)
Lymphocytes Relative: 18 %
MCH: 28.4 pg (ref 26.0–34.0)
MCHC: 32.3 g/dL (ref 30.0–36.0)
MCV: 88 fL (ref 78.0–100.0)
MONOS PCT: 4 %
Monocytes Absolute: 0.6 10*3/uL (ref 0.1–1.0)
Neutro Abs: 11.4 10*3/uL — ABNORMAL HIGH (ref 1.7–7.7)
Neutrophils Relative %: 77 %
PLATELETS: 309 10*3/uL (ref 150–400)
RBC: 2.99 MIL/uL — AB (ref 4.22–5.81)
RDW: 13.6 % (ref 11.5–15.5)
WBC: 14.7 10*3/uL — AB (ref 4.0–10.5)

## 2016-08-31 LAB — BASIC METABOLIC PANEL
Anion gap: 5 (ref 5–15)
BUN: 31 mg/dL — AB (ref 6–20)
CO2: 28 mmol/L (ref 22–32)
Calcium: 8.4 mg/dL — ABNORMAL LOW (ref 8.9–10.3)
Chloride: 102 mmol/L (ref 101–111)
Creatinine, Ser: 1.37 mg/dL — ABNORMAL HIGH (ref 0.61–1.24)
GFR calc Af Amer: >60 mL/min (ref >60)
GFR, EST NON AFRICAN AMERICAN: 58 mL/min — AB (ref >60)
GLUCOSE: 222 mg/dL — AB (ref 65–99)
POTASSIUM: 5.3 mmol/L — AB (ref 3.5–5.1)
Sodium: 135 mmol/L (ref 135–145)

## 2016-08-31 LAB — GLUCOSE, CAPILLARY
GLUCOSE-CAPILLARY: 179 mg/dL — AB (ref 65–99)
Glucose-Capillary: 199 mg/dL — ABNORMAL HIGH (ref 65–99)

## 2016-08-31 MED ORDER — DEXTROSE 5 % IV SOLN: 2.0000 g | INTRAVENOUS | 0 refills | Status: DC

## 2016-08-31 MED ORDER — INSULIN ASPART 100 UNIT/ML ~~LOC~~ SOLN: 0.0000 [IU] | Freq: Three times a day (TID) | SUBCUTANEOUS | 0 refills | Status: DC

## 2016-08-31 MED ORDER — METRONIDAZOLE 500 MG PO TABS: 500.0000 mg | ORAL_TABLET | Freq: Three times a day (TID) | ORAL | 0 refills | Status: AC

## 2016-08-31 MED ORDER — OXYCODONE HCL 5 MG PO TABS: 5.0000 mg | ORAL_TABLET | Freq: Four times a day (QID) | ORAL | 0 refills | Status: DC | PRN

## 2016-08-31 MED ORDER — INSULIN GLARGINE 100 UNIT/ML SOLOSTAR PEN: 35.0000 [IU] | PEN_INJECTOR | Freq: Two times a day (BID) | SUBCUTANEOUS | 0 refills | Status: DC

## 2016-08-31 NOTE — Discharge Summary (Addendum)
Physician Discharge Summary  [name] Snow:[id number] DOB: December 03, 1965 DOA: 08/22/2016  PCP: Elizabeth Sauer  Admit date: 08/22/2016 Discharge date: 08/31/2016  Admitted From: Home  Disposition: Home   Recommendations for Outpatient Follow-up:  1. Follow up with PCP in 1 weeks 2. Patient has available insulin lantus, that he will use until seeing his primary care provider 3. Home IV antibiotics and home wound vac has been arranged.  4. Antibiotic regimen with ceftriaxone 2 g intravenously daily and oral Flagyl 500 tid 5. End date of antibiotics: May 10 6. Weekly CBC with differential, BMP, CRP and ESR 7. Remove PICC line at completion of IV antibiotics 8. Follow-up appointment with infectious disease in 3 weeks after discharge  Home Health: Yes  Equipment/Devices: wound vac  Discharge Condition: Stable CODE STATUS: full  Diet recommendation: Heart Healthy / Carb Modified  Brief/Interim Summary: 51 yo male with T2DM presented to the hospital with the chief complain of worsening right foot ulcer. Over last 3 weeks prior to hospitalization his right foot wound had been worsening, coming erythematous and edematous with enlarging ulceration positive drainage. He was seen at the urgent clinic, from there he was referred immediately to hospital for further treatment. On initial physical examination his temperature was 100.2, blood pressure 134/83, heart rate 84, respiratory rate 20, oxygen saturation 98%. He had a 3-4 cm area of ulceration at the lateral aspect of the right foot, with a central area of necrosis. Toe discoloration. Sodium 134, potassium 4.2, chloride 11, bicarbonate 25, glucose 255, BUN 44, creatinine 1.64, calcium 8.7, white count 18.4, hemoglobin 9.9, hematocrit 30.5, platelets 333, urinalysis with too numerous count RBCs, 2-5 white cells negative proteins. X-rays of the right foot show diffuse soft tissue gas within the fifth digit suspect cortical erosive changes at the base  of the fifth proximal phalanx as may be seen in with osteomyelitis. EKG was normal sinus rhythm.  The patient was admitted with a right foot ulcerated lesion, suspected osteomyelitis complicated by sepsis.  1. Sepsis due to right foot osteomyelitis, present on admission. Patient was started on broad-spectrum antibiotics including cefepime and vancomycin as well as oral metronidazole. Further workup with right foot MRI show septic arthritis involving the fifth MTP joint with associated osteomyelitis in the fifth metatarsal head and fifth proximal phalanx. Large dissecting abscess surrounding the fifth MTP joint with diffuse forefoot and midfoot cellulitis and myofasciitis. Patient was seen by orthopedics patient underwent 3 surgical interventions.  A) 08/24/16. 1. Irrigation and debridement of bone, muscle, subcutaneous tissue, tendon, skin of right foot 8 x 6 cm 2. Right foot fifth ray amputation 3. Application of wound VAC less than 50 cm  B) 08/27/16 1. Right fourth ray amputation 2. Right foot sharp excisional debridement of bone, muscle, skin 10 x 8 cm 3. Application wound VAC greater than 50 cm  C) 08/29/16 1. Sharp excisional debridement of bone, muscle, skin 8 x 10 cm 2. Application of Acell  3. Application wound VAC greater than 50 cm 4. Secondary closure of previous surgical incision and wound  Patient tolerated procedures well. Infectious disease was consulted. Considering old cultures positive  for Staphylococcus (MSSA) and Streptococcus, patient was placed on IV ceftriaxone 2 g daily along with oral metronidazole 500 mg, three times for a total of 4 weeks. PICC was placed on the right upper extremity. Plan to complete antibiotics May 10th. With weekly CBC with differential, BMP, CRP and ESR.   Home wound VAC has been arranged, home health and  home physical therapy in place. Follow with I.D in 3 weeks.   2. Type 2 diabetes mellitus with hyperglycemia. Patient was placed on  insulin sliding scale for glucose coverage and monitoring, basal insulin with 35 units twice a day of Lantus, capillary glucose remained controlled, 141, 193, 180, 202, 199 over last 24 hours. Patient was reinforced about compliance with a carbohydrate restricted diet. He still has available Lantus at home, will recommend to continue this agent until he sees is primary care provider.   3. Acute kidney injury and chronic kidney disease stage III. Patient's kidney function was followed closely during his hospitalization, patient received IV fluids and supportive care, kidney function improved, discharge creatinine 1.37, potassium 5.3. Will need weekly BMP during the duration of the intravenous antibiotics.   4. Dyslipidemia. Patient was continued on statin therapy.  5. Chronic pain syndrome. Patient was continue on gabapentin and amitriptyline, acetaminophen/oxycodone as needed. Will need to follow-up as an outpatient with pain clinic.   6. Depression. Continue amitriptyline  7. Hypertension. Patient will resume lisinopril 5 mg daily at discharge.   Discharge Diagnoses:  Principal Problem:   Diabetic foot infection ([place]) Active Problems:   DM type 2, uncontrolled, with neuropathy ([place])   Gout   HTN (hypertension)   AKI (acute kidney injury) ([place])   SIRS (systemic inflammatory response syndrome) ([place])   Leukocytosis   Acute osteomyelitis of metatarsal bone, right ([facility])   Cellulitis of right foot   Osteomyelitis of foot, right, acute ([place])    Discharge Instructions   Allergies as of 08/31/2016      Reactions   No Known Allergies       Medication List    STOP taking these medications   amoxicillin-clavulanate 875-125 MG tablet Commonly known as:  AUGMENTIN   clotrimazole-betamethasone cream Commonly known as:  LOTRISONE   Dulaglutide 1.5 MG/0.5ML Sopn Commonly known as:  TRULICITY   HYDROcodone-acetaminophen 10-325 MG tablet Commonly known as:  NORCO   metFORMIN 500 MG 24  hr tablet Commonly known as:  GLUCOPHAGE-XR     TAKE these medications   allopurinol 300 MG tablet Commonly known as:  ZYLOPRIM Take 1 tablet (300 mg total) by mouth daily. What changed:  when to take this  reasons to take this   amitriptyline 100 MG tablet Commonly known as:  ELAVIL Take 1 tablet (100 mg total) by mouth at bedtime. What changed:  when to take this  reasons to take this   cefTRIAXone 2 g in dextrose 5 % 50 mL Inject 2 g into the vein daily.   gabapentin 600 MG tablet Commonly known as:  NEURONTIN Take 2 tablets (1,200 mg total) by mouth 3 (three) times daily. What changed:  when to take this   glucose blood test strip Use as directed twice a day. DX Code: E11.40   insulin aspart 100 UNIT/ML injection Commonly known as:  novoLOG Inject 0-12 Units into the skin 3 (three) times daily with meals. Glucose 150-200 use 4 units, 201-250 use 6 units, 251-300 use 8 units, 301 and 350 use 10 units, 351 or greater use 12 units.   Insulin Glargine 100 UNIT/ML Solostar Pen Commonly known as:  LANTUS SOLOSTAR Inject 35 Units into the skin 2 (two) times daily. What changed:  how much to take   INSULIN SYRINGE 1CC/31GX5/16" 31G X 5/16" 1 ML Misc Use as directed once a day. Dx Code: 250.00   lisinopril 10 MG tablet Commonly known as:  PRINIVIL,ZESTRIL Take 0.5 tablets (  5 mg total) by mouth daily. What changed:  when to take this   metroNIDAZOLE 500 MG tablet Commonly known as:  FLAGYL Take 1 tablet (500 mg total) by mouth every 8 (eight) hours.   ONE TOUCH ULTRA SYSTEM KIT w/Device Kit Use as directed.  DX Code: X32.35   ONETOUCH DELICA LANCETS 57D Misc Use as directed twice a day. DX Code: E11.40   oxyCODONE 5 MG immediate release tablet Commonly known as:  Oxy IR/ROXICODONE Take 1 tablet (5 mg total) by mouth every 6 (six) hours as needed for breakthrough pain.   rosuvastatin 20 MG tablet Commonly known as:  CRESTOR Take 1 tablet (20 mg total) by  mouth daily. What changed:  when to take this            Durable Medical Equipment        Start     Ordered   08/29/16 1511  For home use only DME Negative pressure wound device  Once    Question Answer Comment  Frequency of dressing change 3 times per week   Length of need 3 Months   Dressing type Foam   Amount of suction 125 mm/Hg   Pressure application Continuous pressure   Supplies 10 canisters and 15 dressings per month for duration of therapy      08/29/16 1510   08/27/16 1636  DME 3 n 1  Once     08/27/16 1635     Follow-up Information    Eduard Roux, MD In 2 weeks.   Specialty:  Orthopedic Surgery Why:  For wound re-check Contact information: [place] [address] [phone]          Allergies  Allergen Reactions  . No Known Allergies     Consultations:  Orthopedics  Infectious disease   Procedures/Studies: Mr Foot Right W Wo Contrast  Result Date: 08/23/2016 CLINICAL DATA:  Diabetic foot ulcer. Pain and swelling along the fifth toe. Abnormal x-rays. EXAM: MRI OF THE RIGHT FOREFOOT WITHOUT AND WITH CONTRAST TECHNIQUE: Multiplanar, multisequence MR imaging of the right foot was performed before and after the administration of intravenous contrast. CONTRAST:  32m MULTIHANCE GADOBENATE DIMEGLUMINE 529 MG/ML IV SOLN COMPARISON:  Radiographs 08/22/2016 FINDINGS: MR findings consistent with septic arthritis involving the fifth MTP joint with osteomyelitis involving the fifth metatarsal head and fifth proximal phalanx. There is an associated large dissecting abscess surrounding the MTP joint. The dorsal component of the abscess measures approximately 13 x 13 mm. It extends down between the fourth and fifth metatarsal heads and extends out through the plantar aspect of the foot through an open wound. The Small foreign body was noted near the distal phalanx of the great toe. There is signal abnormality in this area but no abscess.  Possible foreign body reaction. Diffuse and fairly marked cellulitis and myofasciitis involving the forefoot and midfoot. IMPRESSION: 1. Septic arthritis involving the fifth MTP joint with associated osteomyelitis in the fifth metatarsal head and fifth proximal phalanx. 2. Large dissecting abscess surrounding the fifth MTP joint 3. Diffuse forefoot and midfoot cellulitis and myofasciitis. Electronically Signed   By: PMarijo SanesM.D.   On: 08/23/2016 08:51   Dg Foot Complete Right  Result Date: 08/22/2016 CLINICAL DATA:  Osteomyelitis of the right foot, wound to the underside of the right foot and fifth toe EXAM: RIGHT FOOT COMPLETE - 3+ VIEW COMPARISON:  07/17/2016 FINDINGS: Thin metallic opacity overlying the distal soft tissues of the first digit is unchanged.  Small lucent lesion at the base of the first distal phalanx also unchanged. Soft tissue gas at the fifth digit. Suspect cortical erosive changes along the medial base of the fifth proximal phalanx. No joint space narrowing. No fracture. IMPRESSION: 1. Diffuse soft tissue gas within the fifth digit. 2. Suspect cortical erosive changes at the base of the fifth proximal phalanx as may be seen with osteomyelitis. 3. Stable linear possible foreign body distal great toe Electronically Signed   By: Donavan Foil M.D.   On: 08/22/2016 20:42       Subjective: Patient feeling better, no nausea or vomiting, tolerating po well. Right foot pain is controlled.   Discharge Exam: Vitals:   08/30/16 2026 08/31/16 0447  BP: (!) 145/64 104/88  Pulse: 71 63  Resp: 18 18  Temp: 99.8 F (37.7 C) 98.6 F (37 C)   Vitals:   08/30/16 0445 08/30/16 1428 08/30/16 2026 08/31/16 0447  BP: (!) 112/53 (!) 137/52 (!) 145/64 104/88  Pulse: 67 71 71 63  Resp: '19 18 18 18  '$ Temp: 99.4 F (37.4 C) 98.3 F (36.8 C) 99.8 F (37.7 C) 98.6 F (37 C)  TempSrc: Oral Oral Oral Oral  SpO2: 95% 98% 100% 95%  Weight:      Height:        General: Pt is alert, awake,  not in acute distress Cardiovascular: RRR, S1/S2 +, no rubs, no gallops Respiratory: CTA bilaterally, no wheezing, no rhonchi Abdominal: Soft, NT, ND, bowel sounds + Extremities: Right foot with mild edema, no cyanosis, Right foot with wound vac in place.     The results of significant diagnostics from this hospitalization (including imaging, microbiology, ancillary and laboratory) are listed below for reference.     Microbiology: Recent Results (from the past 240 hour(s))  Culture, blood (routine x 2)     Status: None   Collection Time: 08/22/16 10:12 PM  Result Value Ref Range Status   Specimen Description BLOOD RIGHT ANTECUBITAL  Final   Special Requests   Final    BOTTLES DRAWN AEROBIC ONLY Blood Culture adequate volume   Culture   Final    NO GROWTH 5 DAYS Performed at [facility], [address] [place], [place] [address]    Report Status 08/28/2016 FINAL  Final  Culture, blood (routine x 2)     Status: None   Collection Time: 08/22/16 10:32 PM  Result Value Ref Range Status   Specimen Description BLOOD RIGHT ANTECUBITAL  Final   Special Requests   Final    BOTTLES DRAWN AEROBIC ONLY Blood Culture adequate volume   Culture   Final    NO GROWTH 5 DAYS Performed at [facility], [place] [address] [place], [place] [address]    Report Status 08/28/2016 FINAL  Final  Surgical pcr screen     Status: None   Collection Time: 08/23/16 11:29 AM  Result Value Ref Range Status   MRSA, PCR NEGATIVE NEGATIVE Final   Staphylococcus aureus NEGATIVE NEGATIVE Final    Comment:        The Xpert SA Assay (FDA approved for NASAL specimens in patients over 63 years of age), is one component of a comprehensive surveillance program.  Test performance has been validated by [facility] for patients greater than or equal to 65 year old. It is not intended to diagnose infection nor to guide or monitor treatment.      Labs: BNP (last 3 results) No results for input(s):  BNP in the last 8760 hours. Basic Metabolic Panel:  Recent Labs Lab 08/26/16 0510 08/27/16 1649 08/28/16 0547 08/29/16 0423 08/30/16 0423 08/31/16 0405  NA 137  --  136 135 134* 135  K 4.4  --  4.5 4.7 4.7 5.3*  CL 103  --  98* 99* 98* 102  CO2 24  --  '26 28 28 28  '$ GLUCOSE 210*  --  114* 138* 171* 222*  BUN 30*  --  22* 23* 26* 31*  CREATININE 1.34* 1.21 1.15 1.26* 1.48* 1.37*  CALCIUM 9.0  --  8.8* 8.4* 8.3* 8.4*   Liver Function Tests: No results for input(s): AST, ALT, ALKPHOS, BILITOT, PROT, ALBUMIN in the last 168 hours. No results for input(s): LIPASE, AMYLASE in the last 168 hours. No results for input(s): AMMONIA in the last 168 hours. CBC:  Recent Labs Lab 08/27/16 1649 08/28/16 0547 08/29/16 0423 08/30/16 0423 08/31/16 0405  WBC 12.6* 13.0* 12.3* 15.1* 14.7*  NEUTROABS  --   --  7.4 10.0* 11.4*  HGB 11.0* 9.8* 9.7* 8.8* 8.5*  HCT 34.1* 31.1* 31.3* 28.5* 26.3*  MCV 86.5 86.6 87.2 88.2 88.0  PLT 413* 375 346 362 309   Cardiac Enzymes: No results for input(s): CKTOTAL, CKMB, CKMBINDEX, TROPONINI in the last 168 hours. BNP: Invalid input(s): POCBNP CBG:  Recent Labs Lab 08/30/16 0800 08/30/16 1211 08/30/16 1616 08/30/16 2027 08/31/16 0447  GLUCAP 141* 193* 180* 202* 199*   D-Dimer No results for input(s): DDIMER in the last 72 hours. Hgb A1c No results for input(s): HGBA1C in the last 72 hours. Lipid Profile No results for input(s): CHOL, HDL, LDLCALC, TRIG, CHOLHDL, LDLDIRECT in the last 72 hours. Thyroid function studies No results for input(s): TSH, T4TOTAL, T3FREE, THYROIDAB in the last 72 hours.  Invalid input(s): FREET3 Anemia work up No results for input(s): VITAMINB12, FOLATE, FERRITIN, TIBC, IRON, RETICCTPCT in the last 72 hours. Urinalysis    Component Value Date/Time   COLORURINE YELLOW 08/22/2016 2007   APPEARANCEUR HAZY (A) 08/22/2016 2007   LABSPEC 1.016 08/22/2016 2007   PHURINE 5.0 08/22/2016 2007   GLUCOSEU 150 (A)  08/22/2016 2007   HGBUR LARGE (A) 08/22/2016 2007   BILIRUBINUR NEGATIVE 08/22/2016 2007   BILIRUBINUR negative 05/19/2015 1025   BILIRUBINUR neg 06/17/2014 [place] 08/22/2016 2007   PROTEINUR NEGATIVE 08/22/2016 2007   UROBILINOGEN 0.2 05/19/2015 1025   UROBILINOGEN 0.2 06/11/2010 2040   NITRITE NEGATIVE 08/22/2016 2007   LEUKOCYTESUR NEGATIVE 08/22/2016 2007   Sepsis Labs Invalid input(s): PROCALCITONIN,  WBC,  LACTICIDVEN Microbiology Recent Results (from the past 240 hour(s))  Culture, blood (routine x 2)     Status: None   Collection Time: 08/22/16 10:12 PM  Result Value Ref Range Status   Specimen Description BLOOD RIGHT ANTECUBITAL  Final   Special Requests   Final    BOTTLES DRAWN AEROBIC ONLY Blood Culture adequate volume   Culture   Final    NO GROWTH 5 DAYS Performed at [facility], [place] [address] [place], [place] [address]    Report Status 08/28/2016 FINAL  Final  Culture, blood (routine x 2)     Status: None   Collection Time: 08/22/16 10:32 PM  Result Value Ref Range Status   Specimen Description BLOOD RIGHT ANTECUBITAL  Final   Special Requests   Final    BOTTLES DRAWN AEROBIC ONLY Blood Culture adequate volume   Culture   Final    NO GROWTH 5 DAYS  Performed at [facility], [place] [address], [place], [place] [address]    Report Status 08/28/2016 FINAL  Final  Surgical pcr screen     Status: None   Collection Time: 08/23/16 11:29 AM  Result Value Ref Range Status   MRSA, PCR NEGATIVE NEGATIVE Final   Staphylococcus aureus NEGATIVE NEGATIVE Final    Comment:        The Xpert SA Assay (FDA approved for NASAL specimens in patients over 50 years of age), is one component of a comprehensive surveillance program.  Test performance has been validated by [facility] for patients greater than or equal to 20 year old. It is not intended to diagnose infection nor to guide or monitor treatment.      Time coordinating  discharge: 45 minutes  SIGNED:   Tawni Millers, MD  [facility] 08/31/2016, 11:52 AM Pager   If 7PM-7AM, please contact night-coverage [email or web address] Password [email or web address]

## 2016-08-31 NOTE — Progress Notes (Addendum)
CM notified Ben with [facility], pt IS DISCHARGED TODAY AND WILL NEED START OF CARE TONIGHT FOR HHRN and [facility] rep, Vaughan Basta is aware of discharge as [facility] pharmacy is providing pharmacy side of home IV ABX.  Cm notified [facility] DMe rep, Reggie to please deliver the rolling walker to room prior to discharge.  CM notified Arline Asp of [facility] who requests I fax facesheet with face to face and orders to her at [phone]; faxed and received confirmation of receipt. No other CM needs were communicated.

## 2016-09-01 ENCOUNTER — Encounter (HOSPITAL_COMMUNITY): Payer: Self-pay | Admitting: Orthopaedic Surgery

## 2016-09-01 DIAGNOSIS — Z89421 Acquired absence of other right toe(s): Secondary | ICD-10-CM | POA: Diagnosis not present

## 2016-09-01 DIAGNOSIS — G894 Chronic pain syndrome: Secondary | ICD-10-CM | POA: Diagnosis not present

## 2016-09-01 DIAGNOSIS — Z792 Long term (current) use of antibiotics: Secondary | ICD-10-CM | POA: Diagnosis not present

## 2016-09-01 DIAGNOSIS — E1142 Type 2 diabetes mellitus with diabetic polyneuropathy: Secondary | ICD-10-CM | POA: Diagnosis not present

## 2016-09-01 DIAGNOSIS — E1122 Type 2 diabetes mellitus with diabetic chronic kidney disease: Secondary | ICD-10-CM | POA: Diagnosis not present

## 2016-09-01 DIAGNOSIS — Z4781 Encounter for orthopedic aftercare following surgical amputation: Secondary | ICD-10-CM | POA: Diagnosis not present

## 2016-09-01 DIAGNOSIS — M86671 Other chronic osteomyelitis, right ankle and foot: Secondary | ICD-10-CM | POA: Diagnosis not present

## 2016-09-01 DIAGNOSIS — N183 Chronic kidney disease, stage 3 (moderate): Secondary | ICD-10-CM | POA: Diagnosis not present

## 2016-09-01 DIAGNOSIS — Z5181 Encounter for therapeutic drug level monitoring: Secondary | ICD-10-CM | POA: Diagnosis not present

## 2016-09-01 DIAGNOSIS — Z452 Encounter for adjustment and management of vascular access device: Secondary | ICD-10-CM | POA: Diagnosis not present

## 2016-09-01 DIAGNOSIS — I129 Hypertensive chronic kidney disease with stage 1 through stage 4 chronic kidney disease, or unspecified chronic kidney disease: Secondary | ICD-10-CM | POA: Diagnosis not present

## 2016-09-01 LAB — PATHOLOGIST SMEAR REVIEW

## 2016-09-02 ENCOUNTER — Ambulatory Visit (INDEPENDENT_AMBULATORY_CARE_PROVIDER_SITE_OTHER): Payer: PPO | Admitting: Physician Assistant

## 2016-09-02 ENCOUNTER — Encounter: Payer: Self-pay | Admitting: Physician Assistant

## 2016-09-02 VITALS — BP 145/82 | HR 78 | Temp 98.7°F | Resp 18 | Ht 72.0 in | Wt 258.6 lb

## 2016-09-02 DIAGNOSIS — E1169 Type 2 diabetes mellitus with other specified complication: Secondary | ICD-10-CM | POA: Diagnosis not present

## 2016-09-02 DIAGNOSIS — G894 Chronic pain syndrome: Secondary | ICD-10-CM | POA: Diagnosis not present

## 2016-09-02 DIAGNOSIS — E1165 Type 2 diabetes mellitus with hyperglycemia: Secondary | ICD-10-CM

## 2016-09-02 DIAGNOSIS — R7989 Other specified abnormal findings of blood chemistry: Secondary | ICD-10-CM | POA: Diagnosis not present

## 2016-09-02 DIAGNOSIS — E7800 Pure hypercholesterolemia, unspecified: Secondary | ICD-10-CM

## 2016-09-02 DIAGNOSIS — E1142 Type 2 diabetes mellitus with diabetic polyneuropathy: Secondary | ICD-10-CM

## 2016-09-02 DIAGNOSIS — IMO0002 Reserved for concepts with insufficient information to code with codable children: Secondary | ICD-10-CM

## 2016-09-02 DIAGNOSIS — I10 Essential (primary) hypertension: Secondary | ICD-10-CM

## 2016-09-02 DIAGNOSIS — M86171 Other acute osteomyelitis, right ankle and foot: Secondary | ICD-10-CM | POA: Diagnosis not present

## 2016-09-02 DIAGNOSIS — M869 Osteomyelitis, unspecified: Secondary | ICD-10-CM | POA: Diagnosis not present

## 2016-09-02 DIAGNOSIS — N179 Acute kidney failure, unspecified: Secondary | ICD-10-CM | POA: Diagnosis not present

## 2016-09-02 DIAGNOSIS — E1140 Type 2 diabetes mellitus with diabetic neuropathy, unspecified: Secondary | ICD-10-CM

## 2016-09-02 MED ORDER — GABAPENTIN 600 MG PO TABS: ORAL_TABLET | ORAL | 0 refills | Status: DC

## 2016-09-02 MED ORDER — HYDROCODONE-ACETAMINOPHEN 10-325 MG PO TABS: 1.0000 | ORAL_TABLET | ORAL | 0 refills | Status: DC | PRN

## 2016-09-02 MED ORDER — DULAGLUTIDE 1.5 MG/0.5ML ~~LOC~~ SOAJ: 1.5000 [IU] | SUBCUTANEOUS | 0 refills | Status: DC

## 2016-09-02 NOTE — Telephone Encounter (Signed)
Called pharmacy after speaking with sarah patient is to take  am  afternoon 1800 at night.

## 2016-09-02 NOTE — Assessment & Plan Note (Signed)
Continue f/u with Dr Roda Shutters -- advised pt to really be non-weight bearing to decrease complication possibility

## 2016-09-02 NOTE — Patient Instructions (Addendum)
  Gabapentin  1 in am 2 in afternoon 3 in pm  No more Metformin -- we are checking labs  Lantus -- 35U 2x/day  For now I want you to take your blood sugar - 4 times a day and keep track-- Fasting in the am, and before all meals to allow you to do sliding scale   trulicity - increase the dose to 1.5 weekly  Sliding scale for Novolog with meals Glucose  150-200 -- 4 Units 201-250 -- 6 Units 251-300 -- 8 units 301-350 -- 10 Units 351- or greater -- 12 Units   IF you received an x-ray today, you will receive an invoice from [facility]. Please contact [facility] at [phone] with questions or concerns regarding your invoice.   IF you received labwork today, you will receive an invoice from [facility]. Please contact [facility] at [phone] with questions or concerns regarding your invoice.   Our billing staff will not be able to assist you with questions regarding bills from these companies.  You will be contacted with the lab results as soon as they are available. The fastest way to get your results is to activate your My Chart account. Instructions are located on the last page of this paperwork. If you have not heard from [place] regarding the results in 2 weeks, please contact this office.

## 2016-09-02 NOTE — Assessment & Plan Note (Signed)
For now pt wants to continue his hydrocodone - if this starts to not work we will consider change to oxycodone.  His RX given to him in the hospital for OxyIR was cancelled per me from his visit today - he has Rx at home for his hydrocodone.

## 2016-09-02 NOTE — Progress Notes (Signed)
[name] Snow  MRN: [id number] DOB: 06-27-1965  PCP: Elizabeth Sauer  Chief Complaint  Patient presents with  . Follow-up    insulin    Subjective:  Pt presents to clinic for hospital recheck. He as admitted on 4/6 for cellulitis and osteomyelitis on right 5 toe - he had amputation of 5th toe on 4/8 and then on 4/11 they had to amputate the 4th toe.  He had another debridement on 4/13 and has another schedule 4/19 with Dr Erlinda Hong at [facility].  He is supposed to be non-weight bearing but comes into the clinic without his walker today - he states he is walking on his heel because he just cannot always use the walker - esp in his house which is 3 floors - and when he has to take care of his mother it will be almost impossible.  DM - he never got the Trulicity filled at the higher dose - he has a little Lantus left - he was put on Novolog sliding scale at the hospital which he has not filled the Rx because he had some left from the supply they gave him.  He has not recently been eating but once a day.  He was taken off metformin in the hospital because his kidney function decreased.  It had not returned to normal upon his discharge  Planning on applying for Medicaid due to financial strain - he wife left him and took everything - he has the house and will have to resume taking care of his mother in 2 weeks.    Gout - has been active which is has not in a long time because of his glucose getting lowered in the hospital.  Chronic pain - in the hospital he was not controlled so they switched him to Oxy IR - they gave him a Rx but he wants to go back to the hydrocodone because he felt like that worked ok.    Review of Systems  Constitutional: Negative for chills and fever.    Patient Active Problem List   Diagnosis Date Noted  . Acute osteomyelitis of metatarsal bone, right ([place]) 08/23/2016  . AKI (acute kidney injury) ([place]) 08/22/2016  . SIRS (systemic inflammatory response syndrome)  ([place]) 08/22/2016  . HTN (hypertension) 07/22/2016  . Chronic pain syndrome 01/17/2016  . Pain in joint, shoulder region 06/29/2015  . Hyperlipidemia 10/17/2014  . Arteriosclerosis 06/17/2014  . Lung nodule-CT 3/15 06/17/2014  . Hepatomegaly-steatosis 08/08/2013  . DM neuropathy, painful ([place]) 10/19/2011  . Hip pain, chronic 10/19/2011  . Onychomycosis 10/19/2011  . Nephrolithiasis 10/19/2011  . Carpal tunnel syndrome 10/19/2011  . Obesity, Class III, BMI 40-49.9 (morbid obesity) ([place]) 04/30/2011  . DM type 2, uncontrolled, with neuropathy ([place]) 04/30/2011  . Gout 04/30/2011  . 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 glucose blood test strip Use as directed twice a day. DX Code: E11.40 100 each 4  . insulin aspart (NOVOLOG) 100 UNIT/ML injection Inject 0-12 Units into the skin 3 (three) times daily with meals. Glucose 150-200 use 4 units, 201-250 use 6 units, 251-300 use 8 units, 301 and 350 use 10 units, 351 or greater use 12 units. 10 mL 0  . Insulin Glargine (LANTUS SOLOSTAR) 100 UNIT/ML Solostar Pen Inject 35 Units into the skin 2 (two) times daily. 15 mL 0  . Insulin Syringe-Needle U-100 (INSULIN SYRINGE 1CC/31GX5/16") 31G X 5/16" 1  ML MISC Use as directed once a day. Dx Code: 250.00 100 each 1  . lisinopril (PRINIVIL,ZESTRIL) 10 MG tablet Take 0.5 tablets (5 mg total) by mouth daily. (Patient taking differently: Take 5 mg by mouth every morning. ) 90 tablet 0  . metroNIDAZOLE (FLAGYL) 500 MG tablet Take 1 tablet (500 mg total) by mouth every 8 (eight) hours. 75 tablet 0  . ONETOUCH DELICA LANCETS 79G MISC Use as directed twice a day. DX Code: E11.40 100 each 12  . rosuvastatin (CRESTOR) 20 MG tablet Take 1 tablet (20 mg total) by mouth daily. (Patient taking differently: Take 20 mg by mouth every evening. ) 90 tablet 0  . cefTRIAXone 2 g in dextrose 5 % 50 mL Inject 2 g into the  vein daily. (Patient not taking: Reported on 09/02/2016) 25 Dose 0   No current facility-administered medications on file prior to visit.     Allergies  Allergen Reactions  . No Known Allergies     Pt patients past, family and social history were reviewed and updated.   Objective:  BP (!) 145/82   Pulse 78   Temp 98.7 F (37.1 C) (Oral)   Resp 18   Ht 6' (1.829 m)   Wt 258 lb 9.6 oz (117.3 kg)   SpO2 98%   BMI 35.07 kg/m   Physical Exam  Constitutional: He is oriented to person, place, and time and well-developed, well-nourished, and in no distress.  HENT:  Head: Normocephalic and atraumatic.  Right Ear: External ear normal.  Left Ear: External ear normal.  Eyes: Conjunctivae are normal.  Neck: Normal range of motion.  Cardiovascular: Normal rate, regular rhythm and normal heart sounds.   No murmur heard. Pulmonary/Chest: Effort normal and breath sounds normal. He has no wheezes.  Neurological: He is alert and oriented to person, place, and time. Gait normal.  Skin: Skin is warm and dry.  Psychiatric: Mood, memory, affect and judgment normal.    Assessment and Plan :   Problem List Items Addressed This Visit      Cardiovascular and Mediastinum   HTN (hypertension)    Elevated today - pt is currently taking lisinopril '5mg'$  a day and it has been controlled - he will recheck with me in a month and we may have to adjust at that time.        Endocrine   DM neuropathy, painful (HCC)    Pt to continue neurontin - pt in the past has been taking intermittently - d/w pt the importance of taking his medication daily in pain control.      Relevant Medications   Dulaglutide (TRULICITY) 1.5 XQ/1.1HE SOPN   gabapentin (NEURONTIN) 600 MG tablet   DM type 2, uncontrolled, with neuropathy ([place])    We need to get better control of his glucose.  He will change to qid blood sugar testing and stay on his Lantus, restart his higher dose trulicity and continue sliding scale novolog.   He must start to eat more meals a day.  We talked about the fact that he gets gout when his glucose is controlled but he must have better control of his DM esp with his recent amputation and multiple debridement surgeries of which he is not done with.  He currently has Acell on the wound because there was not a skin flap available - they may need to do a skin graft - keeping his glucose controlled will definitely help with his healing.  He will continue to  not take Metformin - he did not like it anyway due to GI upset.  Consider endo referral.      Relevant Medications   Dulaglutide (TRULICITY) 1.5 AY/8.4FU SOPN     Musculoskeletal and Integument   Acute osteomyelitis of metatarsal bone, right (HCC)    Continue f/u with Dr Erlinda Hong -- advised pt to really be non-weight bearing to decrease complication possibility        Genitourinary   AKI (acute kidney injury) ([place])    Check labs today - stay off metformin for now - he does not like it anyway due to stomach upset.      Relevant Orders   CMP14+EGFR     Other   Chronic pain syndrome    For now pt wants to continue his hydrocodone - if this starts to not work we will consider change to oxycodone.  His RX given to him in the hospital for OxyIR was cancelled per me from his visit today - he has Rx at home for his hydrocodone.      Hyperlipidemia    Continue current mediation.       Other Visit Diagnoses    Elevated serum creatinine    -  Primary   Diabetic osteomyelitis (HCC)       Relevant Medications   Dulaglutide (TRULICITY) 1.5 WT/2.1CC SOPN   Other Relevant Orders   CBC with Differential/Platelet      Windell Hummingbird PA-C  [facility] 09/02/2016 7:09 PM

## 2016-09-02 NOTE — Assessment & Plan Note (Signed)
Elevated today - pt is currently taking lisinopril  a day and it has been controlled - he will recheck with me in a month and we may have to adjust at that time.

## 2016-09-02 NOTE — Assessment & Plan Note (Signed)
Pt to continue neurontin - pt in the past has been taking intermittently - d/w pt the importance of taking his medication daily in pain control.

## 2016-09-02 NOTE — Assessment & Plan Note (Signed)
Continue current mediation.

## 2016-09-02 NOTE — Assessment & Plan Note (Addendum)
We need to get better control of his glucose.  He will change to qid blood sugar testing and stay on his Lantus, restart his higher dose trulicity and continue sliding scale novolog.  He must start to eat more meals a day.  We talked about the fact that he gets gout when his glucose is controlled but he must have better control of his DM esp with his recent amputation and multiple debridement surgeries of which he is not done with.  He currently has Acell on the wound because there was not a skin flap available - they may need to do a skin graft - keeping his glucose controlled will definitely help with his healing.  He will continue to not take Metformin - he did not like it anyway due to GI upset.  Consider endo referral.

## 2016-09-02 NOTE — Assessment & Plan Note (Signed)
Check labs today - stay off metformin for now - he does not like it anyway due to stomach upset.

## 2016-09-03 ENCOUNTER — Other Ambulatory Visit: Payer: Self-pay

## 2016-09-03 ENCOUNTER — Encounter (HOSPITAL_COMMUNITY): Payer: Self-pay | Admitting: Urology

## 2016-09-03 ENCOUNTER — Encounter: Payer: Self-pay | Admitting: Pharmacy Technician

## 2016-09-03 ENCOUNTER — Other Ambulatory Visit (INDEPENDENT_AMBULATORY_CARE_PROVIDER_SITE_OTHER): Payer: Self-pay | Admitting: Orthopaedic Surgery

## 2016-09-03 ENCOUNTER — Other Ambulatory Visit: Payer: Self-pay | Admitting: Pharmacy Technician

## 2016-09-03 DIAGNOSIS — I129 Hypertensive chronic kidney disease with stage 1 through stage 4 chronic kidney disease, or unspecified chronic kidney disease: Secondary | ICD-10-CM | POA: Diagnosis not present

## 2016-09-03 DIAGNOSIS — N183 Chronic kidney disease, stage 3 (moderate): Secondary | ICD-10-CM | POA: Diagnosis not present

## 2016-09-03 DIAGNOSIS — L03115 Cellulitis of right lower limb: Secondary | ICD-10-CM

## 2016-09-03 DIAGNOSIS — Z5181 Encounter for therapeutic drug level monitoring: Secondary | ICD-10-CM | POA: Diagnosis not present

## 2016-09-03 DIAGNOSIS — Z792 Long term (current) use of antibiotics: Secondary | ICD-10-CM | POA: Diagnosis not present

## 2016-09-03 DIAGNOSIS — Z89421 Acquired absence of other right toe(s): Secondary | ICD-10-CM | POA: Diagnosis not present

## 2016-09-03 DIAGNOSIS — M86671 Other chronic osteomyelitis, right ankle and foot: Secondary | ICD-10-CM | POA: Diagnosis not present

## 2016-09-03 DIAGNOSIS — G894 Chronic pain syndrome: Secondary | ICD-10-CM | POA: Diagnosis not present

## 2016-09-03 DIAGNOSIS — Z452 Encounter for adjustment and management of vascular access device: Secondary | ICD-10-CM | POA: Diagnosis not present

## 2016-09-03 DIAGNOSIS — E1122 Type 2 diabetes mellitus with diabetic chronic kidney disease: Secondary | ICD-10-CM | POA: Diagnosis not present

## 2016-09-03 DIAGNOSIS — Z4781 Encounter for orthopedic aftercare following surgical amputation: Secondary | ICD-10-CM | POA: Diagnosis not present

## 2016-09-03 LAB — CBC WITH DIFFERENTIAL/PLATELET
BASOS ABS: 0 10*3/uL (ref 0.0–0.2)
Basos: 0 %
EOS (ABSOLUTE): 0.1 10*3/uL (ref 0.0–0.4)
Eos: 1 %
HEMATOCRIT: 32.5 % — AB (ref 37.5–51.0)
Hemoglobin: 10.3 g/dL — ABNORMAL LOW (ref 13.0–17.7)
IMMATURE GRANULOCYTES: 0 %
Immature Grans (Abs): 0 10*3/uL (ref 0.0–0.1)
LYMPHS ABS: 3.2 10*3/uL — AB (ref 0.7–3.1)
Lymphs: 23 %
MCH: 27 pg (ref 26.6–33.0)
MCHC: 31.7 g/dL (ref 31.5–35.7)
MCV: 85 fL (ref 79–97)
MONOS ABS: 0.6 10*3/uL (ref 0.1–0.9)
Monocytes: 4 %
NEUTROS PCT: 72 %
Neutrophils Absolute: 9.9 10*3/uL — ABNORMAL HIGH (ref 1.4–7.0)
Platelets: 469 10*3/uL — ABNORMAL HIGH (ref 150–379)
RBC: 3.81 x10E6/uL — AB (ref 4.14–5.80)
RDW: 14.1 % (ref 12.3–15.4)
WBC: 13.9 10*3/uL — AB (ref 3.4–10.8)

## 2016-09-03 LAB — CMP14+EGFR
A/G RATIO: 0.9 — AB (ref 1.2–2.2)
ALK PHOS: 75 IU/L (ref 39–117)
ALT: 16 IU/L (ref 0–44)
AST: 18 IU/L (ref 0–40)
Albumin: 3.8 g/dL (ref 3.5–5.5)
BILIRUBIN TOTAL: 0.2 mg/dL (ref 0.0–1.2)
BUN/Creatinine Ratio: 27 — ABNORMAL HIGH (ref 9–20)
BUN: 29 mg/dL — ABNORMAL HIGH (ref 6–24)
CHLORIDE: 97 mmol/L (ref 96–106)
CO2: 24 mmol/L (ref 18–29)
CREATININE: 1.09 mg/dL (ref 0.76–1.27)
Calcium: 9.2 mg/dL (ref 8.7–10.2)
GFR calc Af Amer: 90 mL/min/{1.73_m2} (ref >59)
GFR calc non Af Amer: 78 mL/min/{1.73_m2} (ref >59)
GLOBULIN, TOTAL: 4.4 g/dL (ref 1.5–4.5)
Glucose: 170 mg/dL — ABNORMAL HIGH (ref 65–99)
POTASSIUM: 4.8 mmol/L (ref 3.5–5.2)
SODIUM: 138 mmol/L (ref 134–144)
Total Protein: 8.2 g/dL (ref 6.0–8.5)

## 2016-09-03 NOTE — Progress Notes (Signed)
Pt made aware to not take Trulicity DOS and to check BG every 2 hours prior to procedure. Pt made aware to drink 4 ounces of apple or cranberry juice to treat a BG < 70, wait 15 minutes after drinking juice to recheck BG, if BG remains< 70 call SS unit. Pt verbalized understanding.

## 2016-09-03 NOTE — Patient Outreach (Signed)
[facility]) Care Management  09/03/2016  [name] Snow [birth year] [id number]   [name] Snow isa 51 year old male who was referred to pharmacy from the [employer] medication adherence list.  Daryll Brod, CPhT determined he was non-adherent to his insulin and Trulicity due to not being able to afford them.  He was currently out of them. He recently had two toes amputated.  Due to his high risk of rehospitalization, he qualified for the use of the Emergency Pharmacy Fund.  I was able to purchase both his Novolog and Trulicity.  I delivered them to his house and educated him on our program.  I will have one of our pharmacist follow up with him in the next couple of days.   Steve Rattler, PharmD, Psychologist, occupational of [employer] [phone]

## 2016-09-03 NOTE — Progress Notes (Signed)
Patient Diabetic, last A1c 9.3 on 08/22/16.   Pt given instructions to take 1/2 correction dose of novolog if CBG greater than 220 the day of surgery. Pt to take 50% of lantus dose (17 units) tonight and in the morning of surgery. Patient instructed that if blood sugar less than 70 the day of surgery to not take insulin, take 1/2 cup clear juice, glucose tablets, or glucose gel. To re-check in 15 minutes and if CBG less than 70 to call [phone]. Pt verbalized understanding.

## 2016-09-03 NOTE — Patient Outreach (Signed)
Contacted [name] Snow in reference to medication adherence for [name] Snow. Patient recently had part of his foot amputated and is using a wound vac. He states that most of his medication's have been changed but he still takes Rosuvastatin, Lisinopril, and Trulicity. Patient was recently prescribed Lantus as well. Due to his current circumstances he is unable to afford his Trulicity and Lantus and has been using his son's Lantus for now. After speaking with Dawn Pettus, Rph she has agreed to pay for and deliver his medication's. I have contacted his pharmacy ([facility]) in [place], [place] to have medication's filled. I will refer this patient to [name] for further evaluation.  Daryll Brod, CPhT [facility] [phone]

## 2016-09-04 ENCOUNTER — Ambulatory Visit (HOSPITAL_COMMUNITY): Payer: PPO | Admitting: Anesthesiology

## 2016-09-04 ENCOUNTER — Encounter (HOSPITAL_COMMUNITY)
Admission: RE | Disposition: A | Discharge status: Home / Self Care | Payer: Self-pay | Source: Ambulatory Visit | Attending: Orthopaedic Surgery

## 2016-09-04 ENCOUNTER — Encounter (HOSPITAL_COMMUNITY): Payer: Self-pay | Admitting: *Deleted

## 2016-09-04 ENCOUNTER — Ambulatory Visit (HOSPITAL_COMMUNITY)
Admission: RE | Admit: 2016-09-04 | Discharge: 2016-09-04 | Disposition: A | Discharge status: Home / Self Care | Payer: PPO | Source: Ambulatory Visit | Attending: Orthopaedic Surgery | Admitting: Orthopaedic Surgery

## 2016-09-04 DIAGNOSIS — Z79899 Other long term (current) drug therapy: Secondary | ICD-10-CM | POA: Diagnosis not present

## 2016-09-04 DIAGNOSIS — I130 Hypertensive heart and chronic kidney disease with heart failure and stage 1 through stage 4 chronic kidney disease, or unspecified chronic kidney disease: Secondary | ICD-10-CM | POA: Insufficient documentation

## 2016-09-04 DIAGNOSIS — I509 Heart failure, unspecified: Secondary | ICD-10-CM | POA: Diagnosis not present

## 2016-09-04 DIAGNOSIS — Z96641 Presence of right artificial hip joint: Secondary | ICD-10-CM | POA: Diagnosis not present

## 2016-09-04 DIAGNOSIS — E1122 Type 2 diabetes mellitus with diabetic chronic kidney disease: Secondary | ICD-10-CM | POA: Diagnosis not present

## 2016-09-04 DIAGNOSIS — E11621 Type 2 diabetes mellitus with foot ulcer: Secondary | ICD-10-CM | POA: Insufficient documentation

## 2016-09-04 DIAGNOSIS — L97519 Non-pressure chronic ulcer of other part of right foot with unspecified severity: Secondary | ICD-10-CM | POA: Diagnosis not present

## 2016-09-04 DIAGNOSIS — M86171 Other acute osteomyelitis, right ankle and foot: Secondary | ICD-10-CM | POA: Diagnosis not present

## 2016-09-04 DIAGNOSIS — S91301A Unspecified open wound, right foot, initial encounter: Secondary | ICD-10-CM | POA: Diagnosis not present

## 2016-09-04 DIAGNOSIS — I10 Essential (primary) hypertension: Secondary | ICD-10-CM | POA: Diagnosis not present

## 2016-09-04 DIAGNOSIS — E785 Hyperlipidemia, unspecified: Secondary | ICD-10-CM | POA: Diagnosis not present

## 2016-09-04 DIAGNOSIS — N189 Chronic kidney disease, unspecified: Secondary | ICD-10-CM | POA: Insufficient documentation

## 2016-09-04 DIAGNOSIS — L03115 Cellulitis of right lower limb: Secondary | ICD-10-CM

## 2016-09-04 DIAGNOSIS — M109 Gout, unspecified: Secondary | ICD-10-CM | POA: Diagnosis not present

## 2016-09-04 HISTORY — PX: I & D EXTREMITY: SHX5045

## 2016-09-04 HISTORY — DX: Personal history of urinary calculi: Z87.442

## 2016-09-04 LAB — POCT I-STAT 4, (NA,K, GLUC, HGB,HCT)
Glucose, Bld: 161 mg/dL — ABNORMAL HIGH (ref 65–99)
HCT: 30 % — ABNORMAL LOW (ref 39.0–52.0)
Hemoglobin: 10.2 g/dL — ABNORMAL LOW (ref 13.0–17.0)
Potassium: 4.7 mmol/L (ref 3.5–5.1)
Sodium: 140 mmol/L (ref 135–145)

## 2016-09-04 LAB — GLUCOSE, CAPILLARY: GLUCOSE-CAPILLARY: 136 mg/dL — AB (ref 65–99)

## 2016-09-04 SURGERY — IRRIGATION AND DEBRIDEMENT EXTREMITY
Anesthesia: General | Site: Foot | Laterality: Right

## 2016-09-04 MED ORDER — MEPERIDINE HCL 25 MG/ML IJ SOLN: 6.2500 mg | INTRAMUSCULAR | Status: DC | PRN

## 2016-09-04 MED ORDER — FENTANYL CITRATE (PF) 250 MCG/5ML IJ SOLN
INTRAMUSCULAR | Status: AC
  Filled 2016-09-04: qty 5

## 2016-09-04 MED ORDER — PROPOFOL 10 MG/ML IV BOLUS
INTRAVENOUS | Status: AC
  Filled 2016-09-04: qty 20

## 2016-09-04 MED ORDER — CEFAZOLIN SODIUM-DEXTROSE 2-4 GM/100ML-% IV SOLN
INTRAVENOUS | Status: AC
Start: 2016-09-04 — End: 2016-09-04
  Filled 2016-09-04: qty 100

## 2016-09-04 MED ORDER — SUGAMMADEX SODIUM 200 MG/2ML IV SOLN
INTRAVENOUS | Status: AC
  Filled 2016-09-04: qty 2

## 2016-09-04 MED ORDER — ROCURONIUM BROMIDE 10 MG/ML (PF) SYRINGE
PREFILLED_SYRINGE | INTRAVENOUS | Status: AC
  Filled 2016-09-04: qty 5

## 2016-09-04 MED ORDER — HYDROMORPHONE HCL 1 MG/ML IJ SOLN: 0.2500 mg | INTRAMUSCULAR | Status: DC | PRN

## 2016-09-04 MED ORDER — EPHEDRINE 5 MG/ML INJ
INTRAVENOUS | Status: AC
  Filled 2016-09-04: qty 20

## 2016-09-04 MED ORDER — PROPOFOL 10 MG/ML IV BOLUS
INTRAVENOUS | Status: DC | PRN
  Administered 2016-09-04: 200 mg via INTRAVENOUS

## 2016-09-04 MED ORDER — SUGAMMADEX SODIUM 500 MG/5ML IV SOLN
INTRAVENOUS | Status: AC
  Filled 2016-09-04: qty 5

## 2016-09-04 MED ORDER — ONDANSETRON HCL 4 MG/2ML IJ SOLN
INTRAMUSCULAR | Status: AC
  Filled 2016-09-04: qty 4

## 2016-09-04 MED ORDER — MIDAZOLAM HCL 5 MG/5ML IJ SOLN
INTRAMUSCULAR | Status: DC | PRN
  Administered 2016-09-04: 2 mg via INTRAVENOUS

## 2016-09-04 MED ORDER — FENTANYL CITRATE (PF) 250 MCG/5ML IJ SOLN
INTRAMUSCULAR | Status: DC | PRN
  Administered 2016-09-04: 100 ug via INTRAVENOUS
  Administered 2016-09-04: 50 ug via INTRAVENOUS

## 2016-09-04 MED ORDER — DEXAMETHASONE SODIUM PHOSPHATE 10 MG/ML IJ SOLN
INTRAMUSCULAR | Status: AC
  Filled 2016-09-04: qty 1

## 2016-09-04 MED ORDER — CEFAZOLIN SODIUM-DEXTROSE 2-4 GM/100ML-% IV SOLN
2.0000 g | INTRAVENOUS | Status: AC
  Administered 2016-09-04: 2 g via INTRAVENOUS

## 2016-09-04 MED ORDER — MIDAZOLAM HCL 2 MG/2ML IJ SOLN
INTRAMUSCULAR | Status: AC
  Filled 2016-09-04: qty 2

## 2016-09-04 MED ORDER — LACTATED RINGERS IV SOLN
INTRAVENOUS | Status: DC
  Administered 2016-09-04: 13:00:00 via INTRAVENOUS

## 2016-09-04 MED ORDER — 0.9 % SODIUM CHLORIDE (POUR BTL) OPTIME
TOPICAL | Status: DC | PRN
  Administered 2016-09-04: 1000 mL

## 2016-09-04 MED ORDER — EPHEDRINE SULFATE 50 MG/ML IJ SOLN
INTRAMUSCULAR | Status: DC | PRN
  Administered 2016-09-04: 10 mg via INTRAVENOUS

## 2016-09-04 MED ORDER — ONDANSETRON HCL 4 MG/2ML IJ SOLN
INTRAMUSCULAR | Status: DC | PRN
  Administered 2016-09-04: 4 mg via INTRAVENOUS

## 2016-09-04 MED ORDER — ONDANSETRON HCL 4 MG/2ML IJ SOLN
INTRAMUSCULAR | Status: AC
  Filled 2016-09-04: qty 2

## 2016-09-04 MED ORDER — SODIUM CHLORIDE 0.9 % IR SOLN
Status: DC | PRN
  Administered 2016-09-04: 3000 mL

## 2016-09-04 MED ORDER — ONDANSETRON HCL 4 MG/2ML IJ SOLN: 4.0000 mg | Freq: Once | INTRAMUSCULAR | Status: DC | PRN

## 2016-09-04 MED ORDER — LIDOCAINE HCL (CARDIAC) 20 MG/ML IV SOLN
INTRAVENOUS | Status: DC | PRN
  Administered 2016-09-04: 100 mg via INTRATRACHEAL

## 2016-09-04 MED ORDER — LIDOCAINE 2% (20 MG/ML) 5 ML SYRINGE
INTRAMUSCULAR | Status: AC
  Filled 2016-09-04: qty 10

## 2016-09-04 SURGICAL SUPPLY — 48 items
BANDAGE ACE 3X5.8 VEL STRL LF (GAUZE/BANDAGES/DRESSINGS) IMPLANT
BNDG COHESIVE 1X5 TAN STRL LF (GAUZE/BANDAGES/DRESSINGS) IMPLANT
BNDG COHESIVE 4X5 TAN STRL (GAUZE/BANDAGES/DRESSINGS) ×2 IMPLANT
BNDG COHESIVE 6X5 TAN STRL LF (GAUZE/BANDAGES/DRESSINGS) ×4 IMPLANT
BNDG CONFORM 3 STRL LF (GAUZE/BANDAGES/DRESSINGS) IMPLANT
BNDG GAUZE STRTCH 6 (GAUZE/BANDAGES/DRESSINGS) ×2 IMPLANT
CORDS BIPOLAR (ELECTRODE) IMPLANT
COVER SURGICAL LIGHT HANDLE (MISCELLANEOUS) ×2 IMPLANT
CUFF TOURNIQUET SINGLE 24IN (TOURNIQUET CUFF) IMPLANT
CUFF TOURNIQUET SINGLE 34IN LL (TOURNIQUET CUFF) IMPLANT
CUFF TOURNIQUET SINGLE 44IN (TOURNIQUET CUFF) IMPLANT
DRAPE INCISE IOBAN 66X45 STRL (DRAPES) IMPLANT
DRAPE U-SHAPE 47X51 STRL (DRAPES) ×2 IMPLANT
DRSG VAC ATS SM SENSATRAC (GAUZE/BANDAGES/DRESSINGS) ×1 IMPLANT
DURAPREP 26ML APPLICATOR (WOUND CARE) ×2 IMPLANT
ELECT REM PT RETURN 9FT ADLT (ELECTROSURGICAL)
ELECTRODE REM PT RTRN 9FT ADLT (ELECTROSURGICAL) IMPLANT
GAUZE SPONGE 4X4 12PLY STRL (GAUZE/BANDAGES/DRESSINGS) ×4 IMPLANT
GAUZE XEROFORM 5X9 LF (GAUZE/BANDAGES/DRESSINGS) ×2 IMPLANT
GLOVE SKINSENSE NS SZ7.5 (GLOVE) ×2
GLOVE SKINSENSE STRL SZ7.5 (GLOVE) ×2 IMPLANT
GOWN STRL REIN XL XLG (GOWN DISPOSABLE) ×4 IMPLANT
HANDPIECE INTERPULSE COAX TIP (DISPOSABLE)
KIT BASIN OR (CUSTOM PROCEDURE TRAY) ×2 IMPLANT
KIT ROOM TURNOVER OR (KITS) ×2 IMPLANT
MANIFOLD NEPTUNE II (INSTRUMENTS) ×2 IMPLANT
PACK ORTHO EXTREMITY (CUSTOM PROCEDURE TRAY) ×2 IMPLANT
PAD ABD 8X10 STRL (GAUZE/BANDAGES/DRESSINGS) ×2 IMPLANT
PAD ARMBOARD 7.5X6 YLW CONV (MISCELLANEOUS) ×4 IMPLANT
PADDING CAST ABS 4INX4YD NS (CAST SUPPLIES) ×1
PADDING CAST ABS COTTON 4X4 ST (CAST SUPPLIES) ×1 IMPLANT
PADDING CAST COTTON 6X4 STRL (CAST SUPPLIES) ×2 IMPLANT
SET CYSTO W/LG BORE CLAMP LF (SET/KITS/TRAYS/PACK) ×1 IMPLANT
SET HNDPC FAN SPRY TIP SCT (DISPOSABLE) IMPLANT
SPONGE LAP 18X18 X RAY DECT (DISPOSABLE) ×2 IMPLANT
STOCKINETTE IMPERVIOUS 9X36 MD (GAUZE/BANDAGES/DRESSINGS) ×2 IMPLANT
SUT ETHILON 2 0 FS 18 (SUTURE) ×6 IMPLANT
SUT ETHILON 2 0 PSLX (SUTURE) ×2 IMPLANT
SUT VIC AB 2-0 FS1 27 (SUTURE) ×4 IMPLANT
SWAB CULTURE ESWAB REG 1ML (MISCELLANEOUS) IMPLANT
TOWEL OR 17X24 6PK STRL BLUE (TOWEL DISPOSABLE) ×2 IMPLANT
TOWEL OR 17X26 10 PK STRL BLUE (TOWEL DISPOSABLE) ×2 IMPLANT
TUBE CONNECTING 12X1/4 (SUCTIONS) ×2 IMPLANT
TUBE FEEDING ENTERAL 5FR 16IN (TUBING) IMPLANT
TUBING CYSTO DISP (UROLOGICAL SUPPLIES) ×2 IMPLANT
UNDERPAD 30X30 (UNDERPADS AND DIAPERS) ×4 IMPLANT
WATER STERILE IRR 1000ML POUR (IV SOLUTION) ×2 IMPLANT
YANKAUER SUCT BULB TIP NO VENT (SUCTIONS) ×2 IMPLANT

## 2016-09-04 NOTE — Transfer of Care (Signed)
Immediate Anesthesia Transfer of Care Note  Patient: [name] Snow  Procedure(s) Performed: Procedure(s): IRRIGATION AND DEBRIDEMENT RIGHT FOOT, VAC, POSSIBLE ACELL APPLICATION (Right)  Patient Location: PACU  Anesthesia Type:General  Level of Consciousness: awake, alert  and oriented  Airway & Oxygen Therapy: Patient Spontanous Breathing and Patient connected to nasal cannula oxygen  Post-op Assessment: Report given to RN and Post -op Vital signs reviewed and stable  Post vital signs: Reviewed and stable  Last Vitals:  Vitals:   09/04/16 1239 09/04/16 1506  BP: 132/63 122/66  Pulse: 73 64  Resp: 18 13  Temp: 37 C 36.2 C    Last Pain:  Vitals:   09/04/16 1239  TempSrc: Oral      Patients Stated Pain Goal: 3 (09/04/16 1250)  Complications: No apparent anesthesia complications

## 2016-09-04 NOTE — Anesthesia Postprocedure Evaluation (Signed)
Anesthesia Post Note  Patient: [name] Snow  Procedure(s) Performed: Procedure(s) (LRB): IRRIGATION AND DEBRIDEMENT RIGHT FOOT, VAC SPONGE REPLACEMENT (Right)  Patient location during evaluation: PACU Anesthesia Type: General Level of consciousness: awake and alert Pain management: pain level controlled Vital Signs Assessment: post-procedure vital signs reviewed and stable Respiratory status: spontaneous breathing, nonlabored ventilation, respiratory function stable and patient connected to nasal cannula oxygen Cardiovascular status: blood pressure returned to baseline and stable Postop Assessment: no signs of nausea or vomiting Anesthetic complications: no       Last Vitals:  Vitals:   09/04/16 1530 09/04/16 1545  BP: 115/62 118/73  Pulse: (!) 57 (!) 55  Resp: 10 18  Temp:  36.2 C    Last Pain:  Vitals:   09/04/16 1530  TempSrc:   PainSc: 0-No pain                 Braylan Faul DAVID

## 2016-09-04 NOTE — Anesthesia Preprocedure Evaluation (Signed)
Anesthesia Evaluation  Patient identified by MRN, date of birth, ID band Patient awake    Reviewed: Allergy & Precautions, NPO status , Patient's Chart, lab work & pertinent test results  Airway Mallampati: I  TM Distance: >3 FB Neck ROM: Full    Dental   Pulmonary    Pulmonary exam normal        Cardiovascular hypertension, Pt. on medications Normal cardiovascular exam     Neuro/Psych    GI/Hepatic   Endo/Other  diabetes, Type 2, Insulin Dependent  Renal/GU Renal InsufficiencyRenal disease     Musculoskeletal   Abdominal   Peds  Hematology   Anesthesia Other Findings   Reproductive/Obstetrics                             Anesthesia Physical Anesthesia Plan  ASA: III  Anesthesia Plan: General   Post-op Pain Management:    Induction: Intravenous  Airway Management Planned: LMA  Additional Equipment:   Intra-op Plan:   Post-operative Plan: Extubation in OR  Informed Consent: I have reviewed the patients History and Physical, chart, labs and discussed the procedure including the risks, benefits and alternatives for the proposed anesthesia with the patient or authorized representative who has indicated his/her understanding and acceptance.     Plan Discussed with: CRNA and Surgeon  Anesthesia Plan Comments:         Anesthesia Quick Evaluation

## 2016-09-04 NOTE — Care Management (Signed)
ED CM called to consult with patient concerning question regarding management of PICC line at home. CM met with patient and SO in PACU. Patient reports that he is questioning the use of IV extension to self administer his IV ABT at home because of length of tubing on single port.  Patient states, he cant remember the name of the [facility] agency providing the services. CM noted [facility] in record, contacted CM placed call to [facility] awaiting return call, also told patient to contact the agency as when he returns home where he has the [facility] number to discuss with her, patient verbalized understand teach back done. Updated Verdis Frederickson RN in PACU with plan. No further  CM needs identified at this time.

## 2016-09-04 NOTE — Op Note (Signed)
   Date of Surgery: 09/04/2016  INDICATIONS: [name] Snow is a 51 y.o.-year-old male with a right foot wound;  The patient did consent to the procedure after discussion of the risks and benefits.  PREOPERATIVE DIAGNOSIS: Right foot wound status post debridement and Acell application.  POSTOPERATIVE DIAGNOSIS: Same.  PROCEDURE:  1. Debridement of right foot skin, subcutaneous tissue 6 x 10 cm 2. Application of wound VAC greater than 50 cm  SURGEON: N. Glee Arvin, M.D.  ASSIST: Patrick Jupiter RNFA.  ANESTHESIA:  general  IV FLUIDS AND URINE: See anesthesia.  ESTIMATED BLOOD LOSS: Minimal mL.  IMPLANTS: None  DRAINS: None  COMPLICATIONS: None.  DESCRIPTION OF PROCEDURE: The patient was brought to the operating room and placed supine on the operating table.  The patient had been signed prior to the procedure and this was documented. The patient had the anesthesia placed by the anesthesiologist.  A time-out was performed to confirm that this was the correct patient, site, side and location. The patient did receive antibiotics prior to the incision and was re-dosed during the procedure as needed at indicated intervals.  A tourniquet not placed.  The patient had the operative extremity prepped and draped in the standard surgical fashion.    After removal of the wound VAC we inspected the tissue. There is red beefy granulation tissue that was all viable. I performed gentle debridement of the skin and subcutaneous cutaneous tissue with a curet. I then thoroughly irrigated the wound with normal saline. Adaptic was then placed back on top of the Acell and a wound VAC sponge was placed on top of this with a layer of Adaptic in between. Patient tolerated procedure well and no immediate competitions.  POSTOPERATIVE PLAN: I'll see the patient back in office in one week for wound check.  Mayra Reel, MD [facility] [phone] 3:02 PM

## 2016-09-04 NOTE — H&P (Signed)
PREOPERATIVE H&P  Chief Complaint: right foot wound  HPI: [name] Snow is a 51 y.o. male who presents for surgical treatment of right foot wound.  He denies any changes in medical history.  Past Medical History:  Diagnosis Date  . Arthritis    gout  . Cataract   . CHF (congestive heart failure) (HCC)    patient denies  . Chronic kidney disease   . Clotting disorder [facility])    patient denies  . Diabetes mellitus   . Gout   . History of kidney stones    stents placed prior to and removed  . Hypertension    Past Surgical History:  Procedure Laterality Date  . AMPUTATION Right 08/24/2016   Procedure: IRRIGATION AND DEBRIDEMENT OF RIGHT FOOT AND AMPUTATION OF RIGHT FIFTH RAY;  Surgeon: Tarry Kos, MD;  Location: [facility] ORS;  Service: Orthopedics;  Laterality: Right;  . I&D EXTREMITY Right 08/27/2016   Procedure: IRRIGATION AND DEBRIDEMENT RIGHT FOOT, VAC,  4TH RAY AMPUTATION;  Surgeon: Tarry Kos, MD;  Location: [facility] OR;  Service: Orthopedics;  Laterality: Right;  . I&D EXTREMITY Right 08/29/2016   Procedure: IRRIGATION AND DEBRIDEMENT EXTREMITY WITH ACELL APPLICATION;  Surgeon: Tarry Kos, MD;  Location: [facility] OR;  Service: Orthopedics;  Laterality: Right;  . JOINT REPLACEMENT  [id number]   R Hip  . kidney stents     Social History   Social History  . Marital status: Married    Spouse name: N/A  . Number of children: N/A  . Years of education: N/A   Social History Main Topics  . Smoking status: Never Smoker  . Smokeless tobacco: Never Used  . Alcohol use No  . Drug use: No  . Sexual activity: Yes    Birth control/ protection: None   Other Topics Concern  . None   Social History Narrative  . None   Family History  Problem Relation Age of Onset  . Mental illness Mother   . Hypertension Mother   . Hyperlipidemia Mother   . Mental illness Father   . COPD Father   . Mental illness Brother    Allergies  Allergen Reactions  . No Known Allergies    Prior to  Admission medications   Medication Sig Start Date End Date Taking? Authorizing Provider  cefTRIAXone 2 g in dextrose 5 % 50 mL Inject 2 g into the vein daily. Patient not taking: Reported on 09/02/2016 08/31/16 09/25/16  Coralie Keens, MD  Dulaglutide (TRULICITY) 1.5 MG/0.5ML SOPN Inject 1.5 Units into the skin once a week. 09/02/16   Morrell Riddle, PA-C  gabapentin (NEURONTIN) 600 MG tablet  in the am  in the afternoon  in the evening 09/02/16   Morrell Riddle, PA-C  glucose blood test strip Use as directed twice a day. DX Code: E11.40 07/22/16   Morrell Riddle, PA-C  HYDROcodone-acetaminophen (NORCO) 10-325 MG tablet Take 1 tablet by mouth every 4 (four) hours as needed. 09/02/16   Morrell Riddle, PA-C  insulin aspart (NOVOLOG) 100 UNIT/ML injection Inject 0-12 Units into the skin 3 (three) times daily with meals. Glucose 150-200 use 4 units, 201-250 use 6 units, 251-300 use 8 units, 301 and 350 use 10 units, 351 or greater use 12 units. 08/31/16   Mauricio Annett Gula, MD  Insulin Glargine (LANTUS SOLOSTAR) 100 UNIT/ML Solostar Pen Inject 35 Units into the skin 2 (two) times daily. 08/31/16   Mauricio Annett Gula, MD  Insulin  Syringe-Needle U-100 (INSULIN SYRINGE 1CC/31GX5/16") 31G X 5/16" 1 ML MISC Use as directed once a day. Dx Code: 250.00 01/30/14   Tonye Pearson, MD  lisinopril (PRINIVIL,ZESTRIL) 10 MG tablet Take 0.5 tablets (5 mg total) by mouth daily. Patient taking differently: Take 5 mg by mouth every morning.  04/25/16   Morrell Riddle, PA-C  metroNIDAZOLE (FLAGYL) 500 MG tablet Take 1 tablet (500 mg total) by mouth every 8 (eight) hours. 08/31/16 09/25/16  Mauricio Annett Gula, MD  [facility] DELICA LANCETS 33G MISC Use as directed twice a day. DX Code: E11.40 06/27/15   Tonye Pearson, MD  rosuvastatin (CRESTOR) 20 MG tablet Take 1 tablet (20 mg total) by mouth daily. Patient taking differently: Take 20 mg by mouth every evening.  07/22/16   Morrell Riddle, PA-C      Positive ROS: All other systems have been reviewed and were otherwise negative with the exception of those mentioned in the HPI and as above.  Physical Exam: General: Alert, no acute distress Cardiovascular: No pedal edema Respiratory: No cyanosis, no use of accessory musculature GI: abdomen soft Skin: No lesions in the area of chief complaint Neurologic: Sensation intact distally Psychiatric: Patient is competent for consent with normal mood and affect Lymphatic: no lymphedema  MUSCULOSKELETAL: exam stable  Assessment: right foot wound  Plan: Plan for Procedure(s): IRRIGATION AND DEBRIDEMENT RIGHT FOOT, VAC, POSSIBLE ACELL APPLICATION  The risks benefits and alternatives were discussed with the patient including but not limited to the risks of nonoperative treatment, versus surgical intervention including infection, bleeding, nerve injury,  blood clots, cardiopulmonary complications, morbidity, mortality, among others, and they were willing to proceed.   Glee Arvin, MD   09/04/2016 8:19 AM

## 2016-09-04 NOTE — Anesthesia Procedure Notes (Signed)
Procedure Name: LMA Insertion Date/Time: 09/04/2016 2:39 PM Performed by: Marena Chancy Pre-anesthesia Checklist: Patient identified, Emergency Drugs available, Suction available and Patient being monitored Patient Re-evaluated:Patient Re-evaluated prior to inductionOxygen Delivery Method: Circle System Utilized Preoxygenation: Pre-oxygenation with 100% oxygen Intubation Type: IV induction Ventilation: Mask ventilation without difficulty LMA: LMA inserted LMA Size: 5.0 Number of attempts: 1 Airway Equipment and Method: Bite block Placement Confirmation: positive ETCO2 Tube secured with: Tape Dental Injury: Teeth and Oropharynx as per pre-operative assessment

## 2016-09-05 ENCOUNTER — Encounter (HOSPITAL_COMMUNITY): Payer: Self-pay | Admitting: Orthopaedic Surgery

## 2016-09-07 DIAGNOSIS — Z89421 Acquired absence of other right toe(s): Secondary | ICD-10-CM | POA: Diagnosis not present

## 2016-09-07 DIAGNOSIS — M86671 Other chronic osteomyelitis, right ankle and foot: Secondary | ICD-10-CM | POA: Diagnosis not present

## 2016-09-07 DIAGNOSIS — Z4781 Encounter for orthopedic aftercare following surgical amputation: Secondary | ICD-10-CM | POA: Diagnosis not present

## 2016-09-07 DIAGNOSIS — E1122 Type 2 diabetes mellitus with diabetic chronic kidney disease: Secondary | ICD-10-CM | POA: Diagnosis not present

## 2016-09-07 DIAGNOSIS — G894 Chronic pain syndrome: Secondary | ICD-10-CM | POA: Diagnosis not present

## 2016-09-07 DIAGNOSIS — Z5181 Encounter for therapeutic drug level monitoring: Secondary | ICD-10-CM | POA: Diagnosis not present

## 2016-09-07 DIAGNOSIS — Z452 Encounter for adjustment and management of vascular access device: Secondary | ICD-10-CM | POA: Diagnosis not present

## 2016-09-07 DIAGNOSIS — Z792 Long term (current) use of antibiotics: Secondary | ICD-10-CM | POA: Diagnosis not present

## 2016-09-07 DIAGNOSIS — N183 Chronic kidney disease, stage 3 (moderate): Secondary | ICD-10-CM | POA: Diagnosis not present

## 2016-09-07 DIAGNOSIS — I129 Hypertensive chronic kidney disease with stage 1 through stage 4 chronic kidney disease, or unspecified chronic kidney disease: Secondary | ICD-10-CM | POA: Diagnosis not present

## 2016-09-08 DIAGNOSIS — M86671 Other chronic osteomyelitis, right ankle and foot: Secondary | ICD-10-CM | POA: Diagnosis not present

## 2016-09-10 ENCOUNTER — Telehealth (INDEPENDENT_AMBULATORY_CARE_PROVIDER_SITE_OTHER): Payer: Self-pay

## 2016-09-10 NOTE — Telephone Encounter (Signed)
See message below °

## 2016-09-10 NOTE — Telephone Encounter (Signed)
[facility] called and advised that the pt's right surgical foot was " white, blue and green" he states that the pt had a wound vac over the surgical site and this was being changed by [facility] his dog "got loose" and he had to "run through the woods to get him" states that the pt stepped in a puddle of water and called the [facility] for instruction. Advised that he told the pt to remove the dressing and cover with dry dressing out to see the pt today and advised of the status of his foot. Offered appt today and declined, offered appt with XU tomorrow at 8:30 am and agreed to be seen. The [facility] will apply the wound vac today and send pt with supplies for tomorrow incase this needs to be reapplied.

## 2016-09-11 ENCOUNTER — Telehealth: Payer: Self-pay

## 2016-09-11 ENCOUNTER — Inpatient Hospital Stay (INDEPENDENT_AMBULATORY_CARE_PROVIDER_SITE_OTHER): Payer: PPO | Admitting: Orthopaedic Surgery

## 2016-09-11 ENCOUNTER — Ambulatory Visit (INDEPENDENT_AMBULATORY_CARE_PROVIDER_SITE_OTHER): Payer: PPO | Admitting: Orthopaedic Surgery

## 2016-09-11 DIAGNOSIS — S98131A Complete traumatic amputation of one right lesser toe, initial encounter: Secondary | ICD-10-CM

## 2016-09-11 DIAGNOSIS — M86171 Other acute osteomyelitis, right ankle and foot: Secondary | ICD-10-CM

## 2016-09-11 NOTE — Telephone Encounter (Signed)
Patient called with complaint of PICC was pulled out while working on his truck.  I was completely removed. Patient states he measured  the PICC and the amount removed was exact.  He is schedule to receive antibiotic through Sep 25, 2016.   He will need his PICC replaced.  I will inform Dr Daiva Eves.      Order placed for PICC replacement.

## 2016-09-11 NOTE — Telephone Encounter (Signed)
Patient does not have a follow up visit scheduled with Dr Daiva Eves.  I will check to see if visit is needed .

## 2016-09-11 NOTE — Progress Notes (Signed)
Patient follows up today for wound check. He has been walking on his foot. He chased after his dog and [place] his [place]. He also excellent wrapped out his PICC line while working on his truck.  On exam the wound is healing well. There is good incorporation and regeneration of tissue. There is no exposed bone. There is no evidence of infection or drainage. The sutures were removed. Begin wet-to-dry dressings twice a day. He needs to have his PICC line reinserted. I'll see him back next week for wound check. We'll decide if he needs mores or not.

## 2016-09-12 DIAGNOSIS — M86171 Other acute osteomyelitis, right ankle and foot: Secondary | ICD-10-CM | POA: Diagnosis not present

## 2016-09-12 DIAGNOSIS — Z452 Encounter for adjustment and management of vascular access device: Secondary | ICD-10-CM | POA: Diagnosis not present

## 2016-09-12 DIAGNOSIS — M868X7 Other osteomyelitis, ankle and foot: Secondary | ICD-10-CM | POA: Diagnosis not present

## 2016-09-12 NOTE — Telephone Encounter (Signed)
Order for picc placement faxed to Darl Pikes at [facility]. Phone [phone]/fax [phone]. She will contact patient to arrange a time. I called patient and had to leave a voice mail to expect a call from [facility]. Wendall Mola CMA

## 2016-09-12 NOTE — Telephone Encounter (Signed)
[facility] did get in touch with the patient and he is scheduled to go today at 3:00 pm. Wendall Mola CMA

## 2016-09-15 DIAGNOSIS — M86671 Other chronic osteomyelitis, right ankle and foot: Secondary | ICD-10-CM | POA: Diagnosis not present

## 2016-09-16 ENCOUNTER — Inpatient Hospital Stay (INDEPENDENT_AMBULATORY_CARE_PROVIDER_SITE_OTHER): Payer: PPO | Admitting: Orthopaedic Surgery

## 2016-09-17 DIAGNOSIS — M86671 Other chronic osteomyelitis, right ankle and foot: Secondary | ICD-10-CM | POA: Diagnosis not present

## 2016-09-17 DIAGNOSIS — Z792 Long term (current) use of antibiotics: Secondary | ICD-10-CM | POA: Diagnosis not present

## 2016-09-17 DIAGNOSIS — Z452 Encounter for adjustment and management of vascular access device: Secondary | ICD-10-CM | POA: Diagnosis not present

## 2016-09-17 DIAGNOSIS — I129 Hypertensive chronic kidney disease with stage 1 through stage 4 chronic kidney disease, or unspecified chronic kidney disease: Secondary | ICD-10-CM | POA: Diagnosis not present

## 2016-09-17 DIAGNOSIS — G894 Chronic pain syndrome: Secondary | ICD-10-CM | POA: Diagnosis not present

## 2016-09-17 DIAGNOSIS — N183 Chronic kidney disease, stage 3 (moderate): Secondary | ICD-10-CM | POA: Diagnosis not present

## 2016-09-17 DIAGNOSIS — Z4781 Encounter for orthopedic aftercare following surgical amputation: Secondary | ICD-10-CM | POA: Diagnosis not present

## 2016-09-17 DIAGNOSIS — Z5181 Encounter for therapeutic drug level monitoring: Secondary | ICD-10-CM | POA: Diagnosis not present

## 2016-09-17 DIAGNOSIS — E1122 Type 2 diabetes mellitus with diabetic chronic kidney disease: Secondary | ICD-10-CM | POA: Diagnosis not present

## 2016-09-17 DIAGNOSIS — Z89421 Acquired absence of other right toe(s): Secondary | ICD-10-CM | POA: Diagnosis not present

## 2016-09-18 ENCOUNTER — Other Ambulatory Visit: Payer: Self-pay | Admitting: *Deleted

## 2016-09-18 ENCOUNTER — Ambulatory Visit (INDEPENDENT_AMBULATORY_CARE_PROVIDER_SITE_OTHER): Payer: PPO | Admitting: Orthopaedic Surgery

## 2016-09-18 ENCOUNTER — Other Ambulatory Visit: Payer: Self-pay | Admitting: Pharmacist

## 2016-09-18 DIAGNOSIS — M86171 Other acute osteomyelitis, right ankle and foot: Secondary | ICD-10-CM

## 2016-09-18 MED ORDER — AMOXICILLIN-POT CLAVULANATE 875-125 MG PO TABS: 1.0000 | ORAL_TABLET | Freq: Two times a day (BID) | ORAL | 1 refills | Status: DC

## 2016-09-18 NOTE — Patient Outreach (Signed)
[facility]) Care Management  09/18/2016  Karl Baleshomas G Orsino Apr 27, 1966 [id number]  Patient [facility] by [facility] for concerns with medication affordability.  [facility] Assistant OceanographerClinical Director of Pharmacy Services had purchased a one month supply of patient's insulin aspart (Novolog) and dulaglutide (Trulicity).    Successful outreach to patient---he stated he is in the doctor's office at time of call.  Offered to call back later.   Plan:    Will make another phone outreach to patient this afternoon.    1503:  Outreach call to patient---he reports he was helping a friend and not at home with his medications.  Offered to schedule a phone call with patient and he accepted.   Plan:  Phone call scheduled with patient next week.   Tommye StandardKevin Ysabela Keisler, PharmD, [facility] Clinical Pharmacist [facility] [phone]

## 2016-09-18 NOTE — Telephone Encounter (Signed)
Patient called and advised his PICC has come out again. He advised it was painful for about 2 days and this morning it just started to push itself out and he could not stop it. He is very upset and wants to know what to do now. Advised will speak with the doctor and give him a call back.  Spoke with Dr Daiva EvesVan Dam and was advised to call in Augmentin [phone] for 2 weeks and advise the patient to follow up with his surgeon.   Rx sent to the pharmacy and patient notified.  Patient advised he had an appointment with surgeon today and a follow up 10/02/16. Advised him to have the surgeon call Dr Daiva EvesVan Dam if he will need stronger antibiotics after that visit. Patient advised he understands and will pick up medication today and start. Advised to call the office if any questions.

## 2016-09-18 NOTE — Progress Notes (Signed)
Patient returns today for wound check. He is been extremely noncompliant with recommendations. He walks in today with a normal shoe fully weightbearing. He states that his PICC line came out again. He states that this was not sutured in. On exam his wound has good granulation tissue. No signs of infection. Swelling is improving but still present. At this point begin wet-to-dry dressings twice a day with normal saline. I stressed the importance of being compliant. He needs to be back in the cam boot and nonweightbearing. Follow-up in 2 weeks for wound check. He needs to get in touch with infectious disease again in about getting a PICC line reinserted.

## 2016-09-22 ENCOUNTER — Other Ambulatory Visit: Payer: Self-pay | Admitting: Pharmacist

## 2016-09-22 NOTE — Patient Outreach (Signed)
[facility]) Care Management  09/22/2016  [name] Snow [name] Snow 12-Jul-1965 [id number]  Successful phone outreach to patient HIPAA details verified.    Follow-up call to discuss patient medication assistance options with patient.  Patient reports his financial status recently changed and medications have been difficult for him to afford.  He reports he completed application for Medicaid "a couple weeks ago."  Patient reports he has not followed-up yet with Medicaid but reports he will do so this week.     Counseled patient there are manufacturer assistance programs for his insulin and Trulicity, though he will need to meet income and out-of-pocket spend requirements.  Patient reports he prefers to wait until he heard a decision about his Medicaid application to review other options.   Briefly discussed [facility] LCSW referral, and he prefers to wait until he follows-up with Medicaid.   Plan:  Will place follow-up call to patient next week to see if he heard anything regarding Medicaid and determine what patient would like to do regarding LCSW referral and patient assistance discussion.   Tommye StandardKevin Jimia Gentles, PharmD, [facility] Clinical Pharmacist [facility] [phone]

## 2016-09-26 ENCOUNTER — Telehealth (INDEPENDENT_AMBULATORY_CARE_PROVIDER_SITE_OTHER): Payer: Self-pay | Admitting: Orthopaedic Surgery

## 2016-09-26 NOTE — Telephone Encounter (Signed)
LMOM with details see message below. 

## 2016-09-26 NOTE — Telephone Encounter (Signed)
[facility] asking for the measurements from April 29th-May 14th. Needing to know the link, width, and depth. CB # [phone]

## 2016-09-26 NOTE — Telephone Encounter (Signed)
Approximately 8 x 10 cm and 1 cm depth

## 2016-09-26 NOTE — Telephone Encounter (Signed)
Please advise. See message below.  

## 2016-09-30 ENCOUNTER — Encounter: Payer: Self-pay | Admitting: Physician Assistant

## 2016-09-30 ENCOUNTER — Ambulatory Visit (INDEPENDENT_AMBULATORY_CARE_PROVIDER_SITE_OTHER): Payer: PPO | Admitting: Physician Assistant

## 2016-09-30 VITALS — BP 140/70 | HR 70 | Temp 97.4°F | Resp 18 | Ht 70.08 in | Wt 247.6 lb

## 2016-09-30 DIAGNOSIS — E1140 Type 2 diabetes mellitus with diabetic neuropathy, unspecified: Secondary | ICD-10-CM

## 2016-09-30 DIAGNOSIS — E1169 Type 2 diabetes mellitus with other specified complication: Secondary | ICD-10-CM | POA: Diagnosis not present

## 2016-09-30 DIAGNOSIS — E7800 Pure hypercholesterolemia, unspecified: Secondary | ICD-10-CM | POA: Diagnosis not present

## 2016-09-30 DIAGNOSIS — I10 Essential (primary) hypertension: Secondary | ICD-10-CM | POA: Diagnosis not present

## 2016-09-30 DIAGNOSIS — R7989 Other specified abnormal findings of blood chemistry: Secondary | ICD-10-CM

## 2016-09-30 DIAGNOSIS — E1165 Type 2 diabetes mellitus with hyperglycemia: Secondary | ICD-10-CM

## 2016-09-30 DIAGNOSIS — M869 Osteomyelitis, unspecified: Secondary | ICD-10-CM | POA: Diagnosis not present

## 2016-09-30 DIAGNOSIS — D649 Anemia, unspecified: Secondary | ICD-10-CM

## 2016-09-30 DIAGNOSIS — E1142 Type 2 diabetes mellitus with diabetic polyneuropathy: Secondary | ICD-10-CM

## 2016-09-30 DIAGNOSIS — IMO0002 Reserved for concepts with insufficient information to code with codable children: Secondary | ICD-10-CM

## 2016-09-30 LAB — POCT URINALYSIS DIP (MANUAL ENTRY)
BILIRUBIN UA: NEGATIVE mg/dL
Bilirubin, UA: NEGATIVE
Blood, UA: NEGATIVE
GLUCOSE UA: NEGATIVE mg/dL
Nitrite, UA: NEGATIVE
PROTEIN UA: NEGATIVE mg/dL
Spec Grav, UA: 1.02 (ref 1.010–1.025)
UROBILINOGEN UA: 0.2 U/dL
pH, UA: 5.5 (ref 5.0–8.0)

## 2016-09-30 MED ORDER — ROSUVASTATIN CALCIUM 20 MG PO TABS: 20.0000 mg | ORAL_TABLET | Freq: Every evening | ORAL | 1 refills | Status: DC

## 2016-09-30 MED ORDER — DULAGLUTIDE 1.5 MG/0.5ML ~~LOC~~ SOAJ: 1.5000 [IU] | SUBCUTANEOUS | 0 refills | Status: DC

## 2016-09-30 NOTE — Progress Notes (Signed)
[name] Snow  MRN: [id number] DOB: Jul 13, 1965  PCP: Mancel Bale, PA-C  Chief Complaint  Patient presents with  . Follow-up    Subjective:  Pt presents to clinic for DM recheck. He has been checking his sugars tid and trying to eat 3 meals a day - more protein and less carbs.  He has stopped regular sodas and is drinking a few diet sodas but plans on stopping them.  He mainly drinks water.  150-160 average 2h after meals and low 100s in the am.  He feels better.  He is still upset about his wife leaving but they are currently talking so things are better than they were at his last visit. He has been taking his gabapentin '600mg'$  afternoon and evening but he is still having a lot of pain - burning in his feet.  He has been up on his feet a lot more recently - painting and cleaning his house - needs to do it to keep his self positive.  Mother coming back to live with him next week.  He knows that is going to be hard.  He knows he has to put her in a nursing home but he is not ready yet to do it.  Appt Dr Erlinda Hong - appt in 2 days - planning on a skin graft - change dressing wet-->dry dressing 2x/day.  He has been weight bearing - he is being as careful -- He has been mowing the grass.  He got approved for medicaid - he plans to continue his care here - he is hoping that will help with his bills and medications.  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   Patient Active Problem List   Diagnosis Date Noted  . Acute osteomyelitis of metatarsal bone, right ([place]) 08/23/2016  . AKI (acute kidney injury) ([place]) 08/22/2016  . SIRS (systemic inflammatory response syndrome) ([place]) 08/22/2016  . HTN (hypertension) 07/22/2016  . Chronic pain syndrome 01/17/2016  . Pain in joint, shoulder region 06/29/2015  .  Hyperlipidemia 10/17/2014  . Arteriosclerosis 06/17/2014  . Lung nodule-CT 3/15 06/17/2014  . Hepatomegaly-steatosis 08/08/2013  . DM neuropathy, painful ([place]) 10/19/2011  . Hip pain, chronic 10/19/2011  . Onychomycosis 10/19/2011  . Nephrolithiasis 10/19/2011  . Carpal tunnel syndrome 10/19/2011  . Obesity, Class III, BMI 40-49.9 (morbid obesity) ([place]) 04/30/2011  . DM type 2, uncontrolled, with neuropathy ([place]) 04/30/2011  . Gout 04/30/2011  . 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 amoxicillin-clavulanate (AUGMENTIN) 875-125 MG tablet Take 1 tablet by mouth 2 (two) times daily. 28 tablet 1  . gabapentin (NEURONTIN) 600 MG tablet '600mg'$  in the am '1200mg'$  in the afternoon '1200mg'$  in the evening 180 tablet 0  . HYDROcodone-acetaminophen (NORCO) 10-325 MG tablet Take 1 tablet by mouth every 4 (four) hours as needed. 180 tablet 0  . insulin aspart (NOVOLOG) 100 UNIT/ML injection Inject 0-12 Units into the skin 3 (three) times daily with meals. Glucose 150-200 use 4 units, 201-250 use 6 units, 251-300 use 8 units, 301 and 350 use 10 units, 351 or greater use 12 units. 10 mL 0  . Insulin Glargine (LANTUS SOLOSTAR) 100 UNIT/ML Solostar Pen Inject 35 Units into the skin 2 (two) times daily. 15 mL  0  . lisinopril (PRINIVIL,ZESTRIL) 10 MG tablet Take 0.5 tablets (5 mg total) by mouth daily. (Patient taking differently: Take 5 mg by mouth every morning. ) 90 tablet 0   No current facility-administered medications on file prior to visit.     Allergies  Allergen Reactions  . No Known Allergies     Pt patients past, family and social history were reviewed and updated.   Objective:  BP 140/70   Pulse 70   Temp 97.4 F (36.3 C) (Oral)   Resp 18   Ht 5' 10.08" (1.78 m)   Wt 247 lb 9.6 oz (112.3 kg)   SpO2 97%   BMI 35.45 kg/m   Physical Exam  Constitutional: He is oriented to person,  place, and time and well-developed, well-nourished, and in no distress.  HENT:  Head: Normocephalic and atraumatic.  Right Ear: External ear normal.  Left Ear: External ear normal.  Eyes: Conjunctivae are normal.  Neck: Normal range of motion.  Pulmonary/Chest: Effort normal.  Neurological: He is alert and oriented to person, place, and time. Gait normal.  Skin: Skin is warm and dry.  Right foot in dressing.  Psychiatric: Mood, memory, affect and judgment normal.    Wt Readings from Last 3 Encounters:  09/30/16 247 lb 9.6 oz (112.3 kg)  09/04/16 258 lb (117 kg)  09/02/16 258 lb 9.6 oz (117.3 kg)     Assessment and Plan :  Diabetic polyneuropathy associated with type 2 diabetes mellitus (HCC)  Elevated serum creatinine - Plan: CMP14+EGFR - check lab from last hospitalization - he has not been on metformin since his hospitalization  Diabetic osteomyelitis ([place]) - continue Dr Erlinda Hong - pt encouraged to use a probiotic to help with intestinal irritation from long term abx.  Essential hypertension - he has not taken his medication yet today - it is in the high normal range  DM type 2, uncontrolled, with neuropathy ([place]) - Plan: Hemoglobin A1c, Uric Acid, POCT urinalysis dipstick, Dulaglutide (TRULICITY) 1.5 NA/3.5TD SOPN - continue current medications - his glucose readings are much better - encouraged to continue good eating habits  Pure hypercholesterolemia - Plan: rosuvastatin (CRESTOR) 20 MG tablet - refilled medications.  Anemia, unspecified type - Plan: CBC with Differential/Platelet - check labs   Windell Hummingbird PA-C  Primary Care at [facility] 09/30/2016 2:15 PM

## 2016-09-30 NOTE — Patient Instructions (Addendum)
  Keep up the great work with your eating and checking your glucose that has been very helpful.  Continue keeping your wound as clean as possible.   IF you received an x-ray today, you will receive an invoice from [facility]. Please contact [facility] at [phone] with questions or concerns regarding your invoice.   IF you received labwork today, you will receive an invoice from [facility]. Please contact [facility] at [phone] with questions or concerns regarding your invoice.   Our billing staff will not be able to assist you with questions regarding bills from these companies.  You will be contacted with the lab results as soon as they are available. The fastest way to get your results is to activate your My Chart account. Instructions are located on the last page of this paperwork. If you have not heard from us regarding the results in 2 weeks, please contact this office.

## 2016-10-01 LAB — CMP14+EGFR
ALBUMIN: 3.7 g/dL (ref 3.5–5.5)
ALK PHOS: 72 IU/L (ref 39–117)
ALT: 11 IU/L (ref 0–44)
AST: 15 IU/L (ref 0–40)
Albumin/Globulin Ratio: 1 — ABNORMAL LOW (ref 1.2–2.2)
BILIRUBIN TOTAL: 0.2 mg/dL (ref 0.0–1.2)
BUN / CREAT RATIO: 21 — AB (ref 9–20)
BUN: 24 mg/dL (ref 6–24)
CHLORIDE: 103 mmol/L (ref 96–106)
CO2: 23 mmol/L (ref 18–29)
CREATININE: 1.15 mg/dL (ref 0.76–1.27)
Calcium: 8.7 mg/dL (ref 8.7–10.2)
GFR calc Af Amer: 84 mL/min/{1.73_m2} (ref >59)
GFR calc non Af Amer: 73 mL/min/{1.73_m2} (ref >59)
GLOBULIN, TOTAL: 3.6 g/dL (ref 1.5–4.5)
GLUCOSE: 216 mg/dL — AB (ref 65–99)
Potassium: 4.6 mmol/L (ref 3.5–5.2)
SODIUM: 139 mmol/L (ref 134–144)
Total Protein: 7.3 g/dL (ref 6.0–8.5)

## 2016-10-01 LAB — CBC WITH DIFFERENTIAL/PLATELET
BASOS: 0 %
Basophils Absolute: 0 10*3/uL (ref 0.0–0.2)
EOS (ABSOLUTE): 0.1 10*3/uL (ref 0.0–0.4)
EOS: 1 %
Hematocrit: 35.6 % — ABNORMAL LOW (ref 37.5–51.0)
Hemoglobin: 11.2 g/dL — ABNORMAL LOW (ref 13.0–17.7)
IMMATURE GRANULOCYTES: 0 %
Immature Grans (Abs): 0 10*3/uL (ref 0.0–0.1)
LYMPHS ABS: 2.3 10*3/uL (ref 0.7–3.1)
Lymphs: 25 %
MCH: 27.2 pg (ref 26.6–33.0)
MCHC: 31.5 g/dL (ref 31.5–35.7)
MCV: 86 fL (ref 79–97)
MONOS ABS: 0.5 10*3/uL (ref 0.1–0.9)
Monocytes: 5 %
NEUTROS ABS: 6.4 10*3/uL (ref 1.4–7.0)
Neutrophils: 69 %
PLATELETS: 299 10*3/uL (ref 150–379)
RBC: 4.12 x10E6/uL — AB (ref 4.14–5.80)
RDW: 16.6 % — AB (ref 12.3–15.4)
WBC: 9.3 10*3/uL (ref 3.4–10.8)

## 2016-10-01 LAB — HEMOGLOBIN A1C
ESTIMATED AVERAGE GLUCOSE: 197 mg/dL
HEMOGLOBIN A1C: 8.5 % — AB (ref 4.8–5.6)

## 2016-10-01 LAB — URIC ACID: Uric Acid: 8.2 mg/dL (ref 3.7–8.6)

## 2016-10-02 ENCOUNTER — Encounter (INDEPENDENT_AMBULATORY_CARE_PROVIDER_SITE_OTHER): Payer: Self-pay | Admitting: Orthopaedic Surgery

## 2016-10-02 ENCOUNTER — Ambulatory Visit (INDEPENDENT_AMBULATORY_CARE_PROVIDER_SITE_OTHER): Payer: PPO | Admitting: Orthopaedic Surgery

## 2016-10-02 DIAGNOSIS — M86171 Other acute osteomyelitis, right ankle and foot: Secondary | ICD-10-CM

## 2016-10-02 NOTE — Progress Notes (Signed)
Patient follows up today for wound check. His wound overall looks stable. He continues to have hyper granulation tissue. He has no exposed bone. At this point I'm going to refer him over to plastic surgery Dr. Ulice Boldillingham for further evaluation and treatment. Follow up with me as needed.

## 2016-10-02 NOTE — Addendum Note (Signed)
Addended by: Albertina ParrGARCIA, Jonavon Trieu on: 10/02/2016 12:48 PM   Modules accepted: Orders

## 2016-10-07 ENCOUNTER — Other Ambulatory Visit: Payer: Self-pay | Admitting: Pharmacist

## 2016-10-07 NOTE — Patient Outreach (Signed)
[facility]) Care Management  10/07/2016  [name] Snow [birth year] [id number]  Successful phone outreach to patient, HIPAA details verified.  Patient reported he was contacted by someone with [facility] that he needed to complete additional paperwork for his Medicaid application.  He reports he completed the additional paperwork "sometime last week."  Patient reports his co-pay for Trulicity is $45 and he reports he will be able to pick it up and pay for co-pay at his pharmacy.    Discussed with patient if he doesn't qualify for Medicaid can review eligibility and application process for Extra Help through [employer].   Plan:  Make phone outreach attempt to patient next month to follow-up.    Tommye StandardKevin Jeanann Balinski, PharmD, [facility] Clinical Pharmacist [facility] [phone]

## 2016-10-15 DIAGNOSIS — N183 Chronic kidney disease, stage 3 (moderate): Secondary | ICD-10-CM | POA: Diagnosis not present

## 2016-10-15 DIAGNOSIS — Z89421 Acquired absence of other right toe(s): Secondary | ICD-10-CM | POA: Diagnosis not present

## 2016-10-15 DIAGNOSIS — Z5181 Encounter for therapeutic drug level monitoring: Secondary | ICD-10-CM | POA: Diagnosis not present

## 2016-10-15 DIAGNOSIS — Z4781 Encounter for orthopedic aftercare following surgical amputation: Secondary | ICD-10-CM | POA: Diagnosis not present

## 2016-10-15 DIAGNOSIS — E1122 Type 2 diabetes mellitus with diabetic chronic kidney disease: Secondary | ICD-10-CM | POA: Diagnosis not present

## 2016-10-15 DIAGNOSIS — G894 Chronic pain syndrome: Secondary | ICD-10-CM | POA: Diagnosis not present

## 2016-10-15 DIAGNOSIS — Z792 Long term (current) use of antibiotics: Secondary | ICD-10-CM | POA: Diagnosis not present

## 2016-10-15 DIAGNOSIS — Z452 Encounter for adjustment and management of vascular access device: Secondary | ICD-10-CM | POA: Diagnosis not present

## 2016-10-15 DIAGNOSIS — I129 Hypertensive chronic kidney disease with stage 1 through stage 4 chronic kidney disease, or unspecified chronic kidney disease: Secondary | ICD-10-CM | POA: Diagnosis not present

## 2016-10-15 DIAGNOSIS — M86671 Other chronic osteomyelitis, right ankle and foot: Secondary | ICD-10-CM | POA: Diagnosis not present

## 2016-10-17 DIAGNOSIS — G894 Chronic pain syndrome: Secondary | ICD-10-CM | POA: Diagnosis not present

## 2016-10-17 DIAGNOSIS — I129 Hypertensive chronic kidney disease with stage 1 through stage 4 chronic kidney disease, or unspecified chronic kidney disease: Secondary | ICD-10-CM | POA: Diagnosis not present

## 2016-10-17 DIAGNOSIS — Z792 Long term (current) use of antibiotics: Secondary | ICD-10-CM | POA: Diagnosis not present

## 2016-10-17 DIAGNOSIS — N183 Chronic kidney disease, stage 3 (moderate): Secondary | ICD-10-CM | POA: Diagnosis not present

## 2016-10-17 DIAGNOSIS — Z5181 Encounter for therapeutic drug level monitoring: Secondary | ICD-10-CM | POA: Diagnosis not present

## 2016-10-17 DIAGNOSIS — E1122 Type 2 diabetes mellitus with diabetic chronic kidney disease: Secondary | ICD-10-CM | POA: Diagnosis not present

## 2016-10-17 DIAGNOSIS — Z4781 Encounter for orthopedic aftercare following surgical amputation: Secondary | ICD-10-CM | POA: Diagnosis not present

## 2016-10-17 DIAGNOSIS — M86671 Other chronic osteomyelitis, right ankle and foot: Secondary | ICD-10-CM | POA: Diagnosis not present

## 2016-10-17 DIAGNOSIS — Z89421 Acquired absence of other right toe(s): Secondary | ICD-10-CM | POA: Diagnosis not present

## 2016-10-17 DIAGNOSIS — Z452 Encounter for adjustment and management of vascular access device: Secondary | ICD-10-CM | POA: Diagnosis not present

## 2016-10-17 NOTE — Addendum Note (Signed)
Addendum  created 10/17/16 1237 by Franny Selvage D, MD   Sign clinical note    

## 2016-10-20 NOTE — Addendum Note (Signed)
Addendum  created 10/20/16 1317 by Dryden Tapley, MD   Sign clinical note    

## 2016-10-20 NOTE — Anesthesia Postprocedure Evaluation (Signed)
Anesthesia Post Note  Patient: [name] Snow  Procedure(s) Performed: Procedure(s) (LRB): IRRIGATION AND DEBRIDEMENT EXTREMITY WITH ACELL APPLICATION (Right)     Anesthesia Post Evaluation  Last Vitals:  Vitals:   08/30/16 2026 08/31/16 0447  BP: (!) 145/64 104/88  Pulse: 71 63  Resp: 18 18  Temp: 37.7 C 37 C    Last Pain:  Vitals:   08/31/16 1135  TempSrc:   PainSc: 1                  Keyosha Tiedt S

## 2016-10-24 NOTE — Addendum Note (Signed)
Addendum  created 10/24/16 1151 by Leilani AbleHatchett, Justis Closser, MD   Sign clinical note

## 2016-10-29 DIAGNOSIS — Z4781 Encounter for orthopedic aftercare following surgical amputation: Secondary | ICD-10-CM | POA: Diagnosis not present

## 2016-10-29 DIAGNOSIS — Z89421 Acquired absence of other right toe(s): Secondary | ICD-10-CM | POA: Diagnosis not present

## 2016-10-29 DIAGNOSIS — I129 Hypertensive chronic kidney disease with stage 1 through stage 4 chronic kidney disease, or unspecified chronic kidney disease: Secondary | ICD-10-CM | POA: Diagnosis not present

## 2016-10-29 DIAGNOSIS — Z792 Long term (current) use of antibiotics: Secondary | ICD-10-CM | POA: Diagnosis not present

## 2016-10-29 DIAGNOSIS — G894 Chronic pain syndrome: Secondary | ICD-10-CM | POA: Diagnosis not present

## 2016-10-29 DIAGNOSIS — Z5181 Encounter for therapeutic drug level monitoring: Secondary | ICD-10-CM | POA: Diagnosis not present

## 2016-10-29 DIAGNOSIS — M86671 Other chronic osteomyelitis, right ankle and foot: Secondary | ICD-10-CM | POA: Diagnosis not present

## 2016-10-29 DIAGNOSIS — Z452 Encounter for adjustment and management of vascular access device: Secondary | ICD-10-CM | POA: Diagnosis not present

## 2016-10-29 DIAGNOSIS — N183 Chronic kidney disease, stage 3 (moderate): Secondary | ICD-10-CM | POA: Diagnosis not present

## 2016-10-29 DIAGNOSIS — E1122 Type 2 diabetes mellitus with diabetic chronic kidney disease: Secondary | ICD-10-CM | POA: Diagnosis not present

## 2016-10-31 ENCOUNTER — Other Ambulatory Visit: Payer: Self-pay | Admitting: Pharmacist

## 2016-10-31 DIAGNOSIS — E1122 Type 2 diabetes mellitus with diabetic chronic kidney disease: Secondary | ICD-10-CM | POA: Diagnosis not present

## 2016-10-31 DIAGNOSIS — I129 Hypertensive chronic kidney disease with stage 1 through stage 4 chronic kidney disease, or unspecified chronic kidney disease: Secondary | ICD-10-CM | POA: Diagnosis not present

## 2016-10-31 DIAGNOSIS — M109 Gout, unspecified: Secondary | ICD-10-CM | POA: Diagnosis not present

## 2016-10-31 DIAGNOSIS — N183 Chronic kidney disease, stage 3 (moderate): Secondary | ICD-10-CM | POA: Diagnosis not present

## 2016-10-31 DIAGNOSIS — M86671 Other chronic osteomyelitis, right ankle and foot: Secondary | ICD-10-CM | POA: Diagnosis not present

## 2016-10-31 DIAGNOSIS — Z89421 Acquired absence of other right toe(s): Secondary | ICD-10-CM | POA: Diagnosis not present

## 2016-10-31 DIAGNOSIS — G894 Chronic pain syndrome: Secondary | ICD-10-CM | POA: Diagnosis not present

## 2016-10-31 DIAGNOSIS — Z794 Long term (current) use of insulin: Secondary | ICD-10-CM | POA: Diagnosis not present

## 2016-10-31 DIAGNOSIS — Z4781 Encounter for orthopedic aftercare following surgical amputation: Secondary | ICD-10-CM | POA: Diagnosis not present

## 2016-10-31 NOTE — Patient Outreach (Signed)
[facility]) Care Management  10/31/2016  [name] Snow May 29, 1965 [id number]  Unsuccessful phone outreach to patient, unable to leave message as voicemail box was full.    Patient returned call.  Patient states he received a letter he was approved for Medicaid.  Patient reports he has not tried to fill his prescriptions yet, but if he was approved for Medicaid per his report, his prescription co-pays should be less.    Patient states he is appreciative for follow-up call.  He denies any other pharmacy needs and reports he plans to go by his pharmacy soon to get his refills.   Plan:  Will close pharmacy case at this time.   Patient confirmed he has [facility] Pharmac[facility] phone number should new pharmacy issues arise.   Tommye StandardKevin Liboria Putnam, PharmD, [facility] Clinical Pharmacist [facility] [phone]

## 2016-11-04 ENCOUNTER — Ambulatory Visit (INDEPENDENT_AMBULATORY_CARE_PROVIDER_SITE_OTHER): Payer: PPO | Admitting: Physician Assistant

## 2016-11-04 ENCOUNTER — Encounter: Payer: Self-pay | Admitting: Physician Assistant

## 2016-11-04 VITALS — BP 157/87 | HR 59 | Temp 97.6°F | Resp 16 | Ht 70.08 in | Wt 247.4 lb

## 2016-11-04 DIAGNOSIS — I10 Essential (primary) hypertension: Secondary | ICD-10-CM

## 2016-11-04 DIAGNOSIS — IMO0002 Reserved for concepts with insufficient information to code with codable children: Secondary | ICD-10-CM

## 2016-11-04 DIAGNOSIS — E1165 Type 2 diabetes mellitus with hyperglycemia: Secondary | ICD-10-CM

## 2016-11-04 DIAGNOSIS — E1142 Type 2 diabetes mellitus with diabetic polyneuropathy: Secondary | ICD-10-CM | POA: Diagnosis not present

## 2016-11-04 DIAGNOSIS — E1140 Type 2 diabetes mellitus with diabetic neuropathy, unspecified: Secondary | ICD-10-CM

## 2016-11-04 DIAGNOSIS — S91301S Unspecified open wound, right foot, sequela: Secondary | ICD-10-CM

## 2016-11-04 DIAGNOSIS — G894 Chronic pain syndrome: Secondary | ICD-10-CM

## 2016-11-04 MED ORDER — HYDROCODONE-ACETAMINOPHEN 10-325 MG PO TABS: 1.0000 | ORAL_TABLET | ORAL | 0 refills | Status: DC | PRN

## 2016-11-04 MED ORDER — GABAPENTIN 400 MG PO CAPS: ORAL_CAPSULE | ORAL | 0 refills | Status: DC

## 2016-11-04 MED ORDER — GLUCOSE BLOOD VI STRP: ORAL_STRIP | 12 refills | Status: AC

## 2016-11-04 MED ORDER — LANCETS MISC: 1.0000 | Freq: Three times a day (TID) | 12 refills | Status: AC

## 2016-11-04 NOTE — Progress Notes (Signed)
[name] Snow  MRN: [id number] DOB: 1965-05-22  PCP: Morrell Riddle, PA-C  Chief Complaint  Patient presents with  . Medication Refill    hydrocodone, gabapentin, lancets and stripes   . Diabetes  . Foot Injury    Subjective:  Pt presents to clinic for medication refill and wound recheck.  He has been released from the surgeon and he canceled his appt with the plastic surgeon because he feels like his wound is getting better and he would like to see if he can heal on his own before he has another surgery.  He is keeping the wound covered.  He has a home health nurse come to change his dressing 2x/week and then he is changing it bid with wet-dry dressings.  He and the home nurse feel like it is getting smaller.  He is using his medication as instructed.  He is trying to eat better and he is drinking no sodas.  His eating has gotten slightly off since his mother move back in with him as that is stressful trying to take care of her.  He has been approved for medicaid so that will help with the cost of his medications.  Fasting 140s most of the time - using medications as directed  Eating - 2x/day - eating more that his mom is back  Mom has been back for the last month  Review of Systems  Patient Active Problem List   Diagnosis Date Noted  . Acute osteomyelitis of metatarsal bone, right (HCC) 08/23/2016  . AKI (acute kidney injury) (HCC) 08/22/2016  . SIRS (systemic inflammatory response syndrome) (HCC) 08/22/2016  . HTN (hypertension) 07/22/2016  . Chronic pain syndrome 01/17/2016  . Pain in joint, shoulder region 06/29/2015  . Hyperlipidemia 10/17/2014  . Arteriosclerosis 06/17/2014  . Lung nodule-CT 3/15 06/17/2014  . Hepatomegaly-steatosis 08/08/2013  . DM neuropathy, painful (HCC) 10/19/2011  . Hip pain, chronic 10/19/2011  . Onychomycosis 10/19/2011  . Nephrolithiasis 10/19/2011  . Carpal tunnel syndrome 10/19/2011  . Obesity, Class III, BMI 40-49.9 (morbid obesity)  (HCC) 04/30/2011  . DM type 2, uncontrolled, with neuropathy (HCC) 04/30/2011  . Gout 04/30/2011  . 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 Dulaglutide (TRULICITY) 1.5 MG/0.5ML SOPN Inject 1.5 Units into the skin once a week. 12 pen 0  . insulin aspart (NOVOLOG) 100 UNIT/ML injection Inject 0-12 Units into the skin 3 (three) times daily with meals. Glucose 150-200 use 4 units, 201-250 use 6 units, 251-300 use 8 units, 301 and 350 use 10 units, 351 or greater use 12 units. 10 mL 0  . Insulin Glargine (LANTUS SOLOSTAR) 100 UNIT/ML Solostar Pen Inject 35 Units into the skin 2 (two) times daily. 15 mL 0  . lisinopril (PRINIVIL,ZESTRIL) 10 MG tablet Take 0.5 tablets (5 mg total) by mouth daily. (Patient taking differently: Take 5 mg by mouth every morning. ) 90 tablet 0  . rosuvastatin (CRESTOR) 20 MG tablet Take 1 tablet (20 mg total) by mouth every evening. 90 tablet 1   No current facility-administered medications on file prior to visit.     Allergies  Allergen Reactions  . No Known Allergies     Pt patients past, family and social history were reviewed and updated.   Objective:  BP (!) 157/87   Pulse (!) 59   Temp 97.6 F (36.4 C) (Oral)   Resp 16   Ht 5'  10.08" (1.78 m)   Wt 247 lb 6.4 oz (112.2 kg)   SpO2 95%   BMI 35.42 kg/m   Physical Exam  Constitutional: He is oriented to person, place, and time and well-developed, well-nourished, and in no distress.  HENT:  Head: Normocephalic and atraumatic.  Right Ear: External ear normal.  Left Ear: External ear normal.  Eyes: Conjunctivae are normal.  Neck: Normal range of motion.  Cardiovascular: Normal rate, regular rhythm, normal heart sounds and intact distal pulses.   Pulmonary/Chest: Effort normal and breath sounds normal. He has no wheezes.  Musculoskeletal:       Right lower leg: He exhibits no edema.       Left  lower leg: He exhibits no edema.  Neurological: He is alert and oriented to person, place, and time. Gait normal.  Skin: Skin is warm and dry.  Wound present - see photos  Psychiatric: Mood, memory, affect and judgment normal.          Wt Readings from Last 3 Encounters:  11/04/16 247 lb 6.4 oz (112.2 kg)  09/30/16 247 lb 9.6 oz (112.3 kg)  09/04/16 258 lb (117 kg)    Assessment and Plan :  Chronic pain syndrome - Plan: HYDROcodone-acetaminophen (NORCO) 10-325 MG tablet - refill medications  Diabetic polyneuropathy associated with type 2 diabetes mellitus (HCC) - Plan: gabapentin (NEURONTIN) 400 MG capsule - continue current medications  Essential hypertension - pt needs to take a full lisiniopril 10mg  daily as his BP is not well controlled  DM type 2, uncontrolled, with neuropathy (HCC) - Plan: Lancets MISC, glucose blood test strip, Hemoglobin A1c  Open wound of right foot, sequela - this is the 1st time I have seen the wound - the patient currently taking good care of the wound - we discussed what happened to his last wound when he stopped taking care of it and he understands the importance - he would like to continue doing home care instead of wound care or skin graft - we will follow closely and keep serial photos to determine whether it is truly getting better.  We talked about the important of really good glucose control if he wants to be successful with wound healing at home.  [name] PA-C  [facility] 11/06/2016 2:34 PM

## 2016-11-04 NOTE — Patient Instructions (Signed)
     IF you received an x-ray today, you will receive an invoice from [facility]. Please contact [facility] at [phone] with questions or concerns regarding your invoice.   IF you received labwork today, you will receive an invoice from [facility]. Please contact [facility] at [phone] with questions or concerns regarding your invoice.   Our billing staff will not be able to assist you with questions regarding bills from these companies.  You will be contacted with the lab results as soon as they are available. The fastest way to get your results is to activate your My Chart account. Instructions are located on the last page of this paperwork. If you have not heard from us regarding the results in 2 weeks, please contact this office.     

## 2016-11-05 LAB — HEMOGLOBIN A1C
ESTIMATED AVERAGE GLUCOSE: 163 mg/dL
Hgb A1c MFr Bld: 7.3 % — ABNORMAL HIGH (ref 4.8–5.6)

## 2016-11-14 ENCOUNTER — Telehealth: Payer: Self-pay | Admitting: Physician Assistant

## 2016-11-15 ENCOUNTER — Encounter: Payer: Self-pay | Admitting: Physician Assistant

## 2016-11-17 DIAGNOSIS — M109 Gout, unspecified: Secondary | ICD-10-CM | POA: Diagnosis not present

## 2016-11-17 DIAGNOSIS — Z4781 Encounter for orthopedic aftercare following surgical amputation: Secondary | ICD-10-CM | POA: Diagnosis not present

## 2016-11-17 DIAGNOSIS — N183 Chronic kidney disease, stage 3 (moderate): Secondary | ICD-10-CM | POA: Diagnosis not present

## 2016-11-17 DIAGNOSIS — M86671 Other chronic osteomyelitis, right ankle and foot: Secondary | ICD-10-CM | POA: Diagnosis not present

## 2016-11-17 DIAGNOSIS — Z794 Long term (current) use of insulin: Secondary | ICD-10-CM | POA: Diagnosis not present

## 2016-11-17 DIAGNOSIS — Z89421 Acquired absence of other right toe(s): Secondary | ICD-10-CM | POA: Diagnosis not present

## 2016-11-17 DIAGNOSIS — E1122 Type 2 diabetes mellitus with diabetic chronic kidney disease: Secondary | ICD-10-CM | POA: Diagnosis not present

## 2016-11-17 DIAGNOSIS — I129 Hypertensive chronic kidney disease with stage 1 through stage 4 chronic kidney disease, or unspecified chronic kidney disease: Secondary | ICD-10-CM | POA: Diagnosis not present

## 2016-11-17 DIAGNOSIS — G894 Chronic pain syndrome: Secondary | ICD-10-CM | POA: Diagnosis not present

## 2016-11-18 ENCOUNTER — Ambulatory Visit (INDEPENDENT_AMBULATORY_CARE_PROVIDER_SITE_OTHER): Payer: PPO | Admitting: Physician Assistant

## 2016-11-18 ENCOUNTER — Ambulatory Visit (INDEPENDENT_AMBULATORY_CARE_PROVIDER_SITE_OTHER): Payer: PPO

## 2016-11-18 ENCOUNTER — Encounter: Payer: Self-pay | Admitting: Physician Assistant

## 2016-11-18 VITALS — BP 151/72 | HR 61 | Temp 98.1°F | Resp 18 | Ht 70.08 in | Wt 250.8 lb

## 2016-11-18 DIAGNOSIS — M4184 Other forms of scoliosis, thoracic region: Secondary | ICD-10-CM | POA: Diagnosis not present

## 2016-11-18 DIAGNOSIS — G894 Chronic pain syndrome: Secondary | ICD-10-CM | POA: Diagnosis not present

## 2016-11-18 DIAGNOSIS — M546 Pain in thoracic spine: Secondary | ICD-10-CM

## 2016-11-18 DIAGNOSIS — E7800 Pure hypercholesterolemia, unspecified: Secondary | ICD-10-CM | POA: Diagnosis not present

## 2016-11-18 DIAGNOSIS — Z96641 Presence of right artificial hip joint: Secondary | ICD-10-CM

## 2016-11-18 DIAGNOSIS — Z471 Aftercare following joint replacement surgery: Secondary | ICD-10-CM | POA: Diagnosis not present

## 2016-11-18 DIAGNOSIS — E1165 Type 2 diabetes mellitus with hyperglycemia: Secondary | ICD-10-CM

## 2016-11-18 DIAGNOSIS — M25551 Pain in right hip: Secondary | ICD-10-CM | POA: Diagnosis not present

## 2016-11-18 DIAGNOSIS — E1140 Type 2 diabetes mellitus with diabetic neuropathy, unspecified: Secondary | ICD-10-CM | POA: Diagnosis not present

## 2016-11-18 DIAGNOSIS — M5134 Other intervertebral disc degeneration, thoracic region: Secondary | ICD-10-CM | POA: Diagnosis not present

## 2016-11-18 DIAGNOSIS — IMO0002 Reserved for concepts with insufficient information to code with codable children: Secondary | ICD-10-CM

## 2016-11-18 MED ORDER — INSULIN GLARGINE 100 UNIT/ML SOLOSTAR PEN: 36.0000 [IU] | PEN_INJECTOR | Freq: Two times a day (BID) | SUBCUTANEOUS | 2 refills | Status: DC

## 2016-11-18 MED ORDER — ROSUVASTATIN CALCIUM 20 MG PO TABS: 20.0000 mg | ORAL_TABLET | Freq: Every evening | ORAL | 1 refills | Status: DC

## 2016-11-18 MED ORDER — HYDROCODONE-ACETAMINOPHEN 10-325 MG PO TABS: 1.0000 | ORAL_TABLET | ORAL | 0 refills | Status: DC | PRN

## 2016-11-18 NOTE — Progress Notes (Unsigned)
At the patient's request, I made a copy of his x-ray on CD, and hand it to him. The patient signed "Release of medical info". form.

## 2016-11-18 NOTE — Patient Instructions (Signed)
     IF you received an x-ray today, you will receive an invoice from [facility]. Please contact [facility] at [phone] with questions or concerns regarding your invoice.   IF you received labwork today, you will receive an invoice from [facility]. Please contact [facility] at [phone] with questions or concerns regarding your invoice.   Our billing staff will not be able to assist you with questions regarding bills from these companies.  You will be contacted with the lab results as soon as they are available. The fastest way to get your results is to activate your My Chart account. Instructions are located on the last page of this paperwork. If you have not heard from us regarding the results in 2 weeks, please contact this office.     

## 2016-11-18 NOTE — Progress Notes (Signed)
[name] Snow  MRN: [id number] DOB: September 12, 1965  PCP: Morrell Riddle, PA-C  Chief Complaint  Patient presents with  . Follow-up    on foot     Subjective:  Pt presents to clinic for wound recheck.  Home health has been coming to his house 3x/week - he needs me to now order that and his supplies as the patient has been discharged from ortho.  He really want to continue to let the wound heal - he feels like it is getting better - he is being really careful with cleaning it unlike the last cellulitis that he had prior to the amputation.  He is keeping his glucose more controlled.  He was mowing a week ago on a hill - side to side and back and forth and his left hip started to hurt - the pain has not improved since then and he is worried about his artifical hip - this is the pain that he had prior to his hip replacement - it radiates into his thigh and pelvis - pain feels like it is in the middle of the joint - Dr Vernie Ammons - did his surgery 8 years ago - he is worried that his right hip might be failing and would like xrays.  The hip really hurts to lay on it.  He is also having mid back pain on the right side - feels like someone is grinding a knuckle into his back on the right side - the pain starts on his right rib cage and then goes to the back - feels better to push on it - he has had something like this in the past which resolved with massage.  He has not been eating much so he is not sure if the pain is worse after he eats.  It is not worse with movement or picking things up.  He does not have abd pain and no nausea or vomiting.  Glucose - at home - 150s fasting   Review of Systems  Patient Active Problem List   Diagnosis Date Noted  . Acute osteomyelitis of metatarsal bone, right (HCC) 08/23/2016  . AKI (acute kidney injury) (HCC) 08/22/2016  . SIRS (systemic inflammatory response syndrome) (HCC) 08/22/2016  . HTN (hypertension) 07/22/2016  . Chronic pain syndrome 01/17/2016  .  Pain in joint, shoulder region 06/29/2015  . Hyperlipidemia 10/17/2014  . Arteriosclerosis 06/17/2014  . Lung nodule-CT 3/15 06/17/2014  . Hepatomegaly-steatosis 08/08/2013  . DM neuropathy, painful (HCC) 10/19/2011  . Hip pain, chronic 10/19/2011  . Onychomycosis 10/19/2011  . Nephrolithiasis 10/19/2011  . Carpal tunnel syndrome 10/19/2011  . Obesity, Class III, BMI 40-49.9 (morbid obesity) (HCC) 04/30/2011  . DM type 2, uncontrolled, with neuropathy (HCC) 04/30/2011  . Gout 04/30/2011  . 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 Dulaglutide (TRULICITY) 1.5 MG/0.5ML SOPN Inject 1.5 Units into the skin once a week. 12 pen 0  . gabapentin (NEURONTIN) 400 MG capsule 1 po qam, 2 po q afternon and 2 po qhs 540 capsule 0  . glucose blood test strip Use as instructed 100 each 12  . insulin aspart (NOVOLOG) 100 UNIT/ML injection Inject 0-12 Units into the skin 3 (three) times daily with meals. Glucose 150-200 use 4 units, 201-250 use 6 units, 251-300 use 8 units, 301 and 350 use 10 units, 351 or greater use 12 units. 10 mL 0  . Lancets MISC  1 Device by Does not apply route 3 (three) times daily. E11.40 100 each 12  . lisinopril (PRINIVIL,ZESTRIL) 10 MG tablet Take 0.5 tablets (5 mg total) by mouth daily. (Patient taking differently: Take 5 mg by mouth every morning. ) 90 tablet 0   No current facility-administered medications on file prior to visit.     Allergies  Allergen Reactions  . No Known Allergies     Pt patients past, family and social history were reviewed and updated.   Objective:  BP (!) 151/72   Pulse 61   Temp 98.1 F (36.7 C) (Oral)   Resp 18   Ht 5' 10.08" (1.78 m)   Wt 250 lb 12.8 oz (113.8 kg)   SpO2 95%   BMI 35.90 kg/m   Physical Exam  Constitutional: He is oriented to person, place, and time and well-developed, well-nourished, and in no distress.  HENT:    Head: Normocephalic and atraumatic.  Right Ear: External ear normal.  Left Ear: External ear normal.  Eyes: Conjunctivae are normal.  Neck: Normal range of motion.  Cardiovascular: Normal rate, regular rhythm and normal heart sounds.   No murmur heard. Pulmonary/Chest: Effort normal and breath sounds normal. He has no wheezes.  Abdominal: There is no tenderness.  Musculoskeletal:       Right hip: He exhibits normal range of motion, normal strength, no tenderness and no bony tenderness.       Left hip: Normal.       Thoracic back: He exhibits pain. He exhibits normal range of motion and no tenderness.       Back:  Neurological: He is alert and oriented to person, place, and time. Gait normal.  Skin: Skin is warm and dry.  Right lateral foot - wound has no erythema - good granulation tissue  - the width of the wound is down to 2.5 cm - the length of the wound is the same - the thick skin at the proximal aspect of the wound is still present but the distal aspect does seem smaller - there is no necrotic tissue and no surrounding erythema  Psychiatric: Mood, memory, affect and judgment normal.   Dg Thoracic Spine 2 View  Result Date: 11/18/2016 CLINICAL DATA:  Dorsalgia EXAM: THORACIC SPINE 2 VIEWS COMPARISON:  Chest radiograph March 29, 2012 FINDINGS: Frontal and lateral views were obtained. There is upper thoracic levoscoliosis. There is no fracture or spondylolisthesis. There are multiple anterior and lateral osteophytes. There is mild disc space narrowing at several levels. No erosive change or paraspinous lesions. IMPRESSION: Osteoarthritic change at several levels. Mild scoliosis. No fracture or spondylolisthesis. Electronically Signed   By: Bretta Bang III M.D.   On: 11/18/2016 15:24   Dg Hip Unilat W Or W/o Pelvis 2-3 Views Right  Result Date: 11/18/2016 CLINICAL DATA:  Post hip replacement with hip pain, initial encounter EXAM: DG HIP (WITH OR WITHOUT PELVIS) 3V RIGHT  COMPARISON:  None. FINDINGS: Pelvic ring is intact. Right hip replacement is noted. No definitive loosening is identified. No acute fracture is seen. IMPRESSION: Postoperative change without acute abnormality. Electronically Signed   By: Alcide Clever M.D.   On: 11/18/2016 15:22    Assessment and Plan :  Pain of right hip joint - Plan: DG HIP UNILAT W OR W/O PELVIS 2-3 VIEWS RIGHT - suspect related to overuse and change in gait since amputation. Xray is normal.  Chronic pain syndrome - Plan: HYDROcodone-acetaminophen (NORCO) 10-325 MG tablet - continue pain medications as  Rx in the past  Status post right hip replacement - Plan: DG HIP UNILAT W OR W/O PELVIS 2-3 VIEWS RIGHT -   Pure hypercholesterolemia - Plan: rosuvastatin (CRESTOR) 20 MG tablet - continue medications  DM type 2, uncontrolled, with neuropathy (HCC) - Plan: Insulin Glargine (LANTUS SOLOSTAR) 100 UNIT/ML Solostar Pen - continue good control  Acute right-sided thoracic back pain - Plan: DG Thoracic Spine 2 View - suspect over use with the mowing esp with the recent partial right foot amputation and the resulting change in gait.  [name] PA-C  Primary Care at [facility] 11/18/2016 5:46 PM

## 2016-12-02 ENCOUNTER — Ambulatory Visit: Payer: PPO | Admitting: Physician Assistant

## 2016-12-11 ENCOUNTER — Telehealth (INDEPENDENT_AMBULATORY_CARE_PROVIDER_SITE_OTHER): Payer: Self-pay | Admitting: Orthopaedic Surgery

## 2016-12-11 NOTE — Telephone Encounter (Signed)
Home Health Certification and Plan of care faxed 12/10/16

## 2016-12-23 ENCOUNTER — Encounter: Payer: Self-pay | Admitting: Physician Assistant

## 2016-12-23 ENCOUNTER — Ambulatory Visit (INDEPENDENT_AMBULATORY_CARE_PROVIDER_SITE_OTHER): Payer: PPO | Admitting: Physician Assistant

## 2016-12-23 VITALS — BP 133/69 | HR 66 | Temp 98.2°F | Resp 18 | Ht 70.8 in | Wt 248.4 lb

## 2016-12-23 DIAGNOSIS — E1142 Type 2 diabetes mellitus with diabetic polyneuropathy: Secondary | ICD-10-CM

## 2016-12-23 DIAGNOSIS — E7800 Pure hypercholesterolemia, unspecified: Secondary | ICD-10-CM | POA: Diagnosis not present

## 2016-12-23 DIAGNOSIS — E1140 Type 2 diabetes mellitus with diabetic neuropathy, unspecified: Secondary | ICD-10-CM | POA: Diagnosis not present

## 2016-12-23 DIAGNOSIS — S91301S Unspecified open wound, right foot, sequela: Secondary | ICD-10-CM

## 2016-12-23 DIAGNOSIS — G894 Chronic pain syndrome: Secondary | ICD-10-CM | POA: Diagnosis not present

## 2016-12-23 DIAGNOSIS — I10 Essential (primary) hypertension: Secondary | ICD-10-CM

## 2016-12-23 DIAGNOSIS — IMO0002 Reserved for concepts with insufficient information to code with codable children: Secondary | ICD-10-CM

## 2016-12-23 DIAGNOSIS — E1165 Type 2 diabetes mellitus with hyperglycemia: Secondary | ICD-10-CM

## 2016-12-23 MED ORDER — HYDROCODONE-ACETAMINOPHEN 10-325 MG PO TABS: 1.0000 | ORAL_TABLET | ORAL | 0 refills | Status: DC | PRN

## 2016-12-23 MED ORDER — "NEXCARE COBAN WRAP 3""X5YD MISC": 1.0000 [IU] | Freq: Every day | 3 refills | Status: DC

## 2016-12-23 MED ORDER — "NON-STICK 3""X4"" PADS": 1.0000 [IU] | MEDICATED_PAD | Freq: Two times a day (BID) | 2 refills | Status: DC

## 2016-12-23 MED ORDER — LUMBAR BACK BRACE/SUPPORT PAD MISC: 1.0000 [IU] | Freq: Every day | 0 refills | Status: DC

## 2016-12-23 NOTE — Patient Instructions (Signed)
     IF you received an x-ray today, you will receive an invoice from [facility]. Please contact [facility] at [phone] with questions or concerns regarding your invoice.   IF you received labwork today, you will receive an invoice from [facility]. Please contact [facility] at [phone] with questions or concerns regarding your invoice.   Our billing staff will not be able to assist you with questions regarding bills from these companies.  You will be contacted with the lab results as soon as they are available. The fastest way to get your results is to activate your My Chart account. Instructions are located on the last page of this paperwork. If you have not heard from us regarding the results in 2 weeks, please contact this office.     

## 2016-12-23 NOTE — Progress Notes (Signed)
Chief Complaint  Patient presents with  . Diabetes    follow up    HPI [name] Snow is a 51 yo male with a significant past medical history of uncontrolled type 2 diabetes with diabetic neuropathy and hypertension who presents to the clinic today for diabetes follow up and wound care management.  Diabetes Fasting blood sugars are as follows: breakfast 160s, lunch has not been checking, dinner 180s. Spike at 220s, and this is when he eats a cinnamon bun. Gabapentin in the evening gives him whole body muscle cramps and happens 4 times a week. He has stopped taking it most nights. Novolog - not using, but has it filled at home. Lisinopril - 5 mg a day Admits to exercising - "walks 4 hours a day" in his house while taking care of his mother in his 3 story house. Admits to intentional weight loss. Eye specialist - seeing one later this year and says he sees one annually.  Wound Reports he is changing the dressings daily. No longer has a nurse coming once a week to the house, for this ended 2 weeks ago due to insurance. According to his nurse, the wound was shrinking 1 cm a week. He has not measured it since nurse left. Needs more supplies to take care of his foot.    Review of Systems  Constitutional: Negative for chills and fever.  Eyes:       Admits to blurred vision. Wears glasses  Respiratory: Negative for shortness of breath.   Cardiovascular: Negative for chest pain and leg swelling.  Genitourinary: Negative for flank pain.  Musculoskeletal: Positive for back pain.  Skin: Positive for wound (Reports that it is bettter than when we last saw it.).  Neurological: Positive for dizziness (attributes to balance issues) and numbness (More in hands than feet).  Endo/Heme/Allergies: Negative for polydipsia.   Review of Systems  Constitutional: Negative for chills and fever.  Eyes:       Admits to blurred vision. Wears glasses  Respiratory: Negative for shortness of breath.    Cardiovascular: Negative for chest pain and leg swelling.  Endocrine: Negative for polydipsia.  Genitourinary: Negative for flank pain.  Musculoskeletal: Positive for back pain.  Skin: Positive for wound (Reports that it is bettter than when we last saw it.).  Neurological: Positive for dizziness (attributes to balance issues) and numbness (More in hands than feet).     Patient Active Problem List   Diagnosis Date Noted  . Acute osteomyelitis of metatarsal bone, right ([place]) 08/23/2016  . SIRS (systemic inflammatory response syndrome) ([place]) 08/22/2016  . HTN (hypertension) 07/22/2016  . Chronic pain syndrome 01/17/2016  . Pain in joint, shoulder region 06/29/2015  . Hyperlipidemia 10/17/2014  . Arteriosclerosis 06/17/2014  . Lung nodule-CT 3/15 06/17/2014  . Hepatomegaly-steatosis 08/08/2013  . DM neuropathy, painful ([place]) 10/19/2011  . Hip pain, chronic 10/19/2011  . Onychomycosis 10/19/2011  . Nephrolithiasis 10/19/2011  . Carpal tunnel syndrome 10/19/2011  . Obesity, Class III, BMI 40-49.9 (morbid obesity) ([place]) 04/30/2011  . DM type 2, uncontrolled, with neuropathy ([place]) 04/30/2011  . Gout 04/30/2011  . 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 Authorizing Provider  Dulaglutide (TRULICITY) 1.5 GG/2.6RS SOPN Inject 1.5 Units into the skin once a week. 09/30/16  Yes Weber, Damaris Hippo, PA-C  gabapentin (NEURONTIN) 400 MG capsule 1 po qam, 2 po q afternon and 2 po qhs 11/04/16  Yes Weber, Damaris Hippo,  PA-C  glucose blood test strip Use as instructed 11/04/16  Yes Weber, Damaris Hippo, PA-C  HYDROcodone-acetaminophen (NORCO) 10-325 MG tablet Take 1 tablet by mouth every 4 (four) hours as needed. 11/18/16  Yes Weber, Sarah L, PA-C  insulin aspart (NOVOLOG) 100 UNIT/ML injection Inject 0-12 Units into the skin 3 (three) times daily with meals. Glucose 150-200 use 4 units, 201-250 use 6 units, 251-300 use 8 units, 301  and 350 use 10 units, 351 or greater use 12 units. 08/31/16  Yes Arrien, Jimmy Picket, MD  Insulin Glargine (LANTUS SOLOSTAR) 100 UNIT/ML Solostar Pen Inject 36 Units into the skin 2 (two) times daily. 11/18/16  Yes Weber, Damaris Hippo, PA-C  Lancets MISC 1 Device by Does not apply route 3 (three) times daily. E11.40 11/04/16  Yes Weber, Sarah L, PA-C  lisinopril (PRINIVIL,ZESTRIL) 10 MG tablet Take 0.5 tablets (5 mg total) by mouth daily. Patient taking differently: Take 5 mg by mouth every morning.  04/25/16  Yes Weber, Sarah L, PA-C  rosuvastatin (CRESTOR) 20 MG tablet Take 1 tablet (20 mg total) by mouth every evening. 11/18/16  Yes Weber, Damaris Hippo, PA-C   Allergies  Allergen Reactions  . No Known Allergies     Vital signs BP 133/69   Pulse 66   Temp 98.2 F (36.8 C) (Oral)   Resp 18   Ht 5' 10.8" (1.798 m)   Wt 248 lb 6.4 oz (112.7 kg)   SpO2 95%   BMI 34.84 kg/m   Physical Exam  Constitutional: He appears well-developed and well-nourished. He is cooperative.  Eyes:  Fundoscopic exam:      The right eye shows red reflex.       The left eye shows red reflex.  Neck: Carotid bruit is not present.  Cardiovascular: Normal rate, regular rhythm and normal heart sounds.   Pulses:      Carotid pulses are 2+ on the right side, and 2+ on the left side.      Radial pulses are 2+ on the right side, and 2+ on the left side.       Dorsalis pedis pulses are 2+ on the right side, and 2+ on the left side.  Pulmonary/Chest: Effort normal and breath sounds normal.  Lymphadenopathy:    He has no cervical adenopathy.       Right: No supraclavicular adenopathy present.       Left: No supraclavicular adenopathy present.  Neurological: He is alert.  Sensory of the hands to light touch felt bilaterally starting at the flexor surface of the wrists. Patients effort was questionable.  Monofilament test performed with loss of sensation with sparse patches of intact sensory bilaterally, but patients efforts  were questionable.   Skin:  Wound on lateral aspect of right foot measuring 1cm by 4cm. See pictures.    Assessment and Plan 1. Essential hypertension - CMP14+EGFR  2. Diabetic polyneuropathy associated with type 2 diabetes mellitus ([place])  3. DM type 2, uncontrolled, with neuropathy ([place]) Continue healthy eating habits. Use Novolog when sugars are spiking. - CMP14+EGFR - Hemoglobin A1c  4. Chronic pain syndrome - HYDROcodone-acetaminophen (NORCO) 10-325 MG tablet; Take 1 tablet by mouth every 4 (four) hours as needed.  Dispense: 180 tablet; Refill: 0  5. Pure hypercholesterolemia Continue healthy eating habits.  6. Open wound of right foot, sequela Continue to change dressing daily. Please come back in 3 weeks. - Gauze Pads & Dressings (NON-STICK PADS 3"X4") 3"X4" PADS; 1 Units by Does not apply route  2 (two) times daily.  Dispense: 180 each; Refill: 2 - Elastic Bandages & Supports (NEXCARE COBAN WRAP 3"X5YD) MISC; 1 Units by Does not apply route daily.  Dispense: 3 each; Refill: 3

## 2016-12-24 LAB — CMP14+EGFR
A/G RATIO: 1.2 (ref 1.2–2.2)
ALT: 10 IU/L (ref 0–44)
AST: 13 IU/L (ref 0–40)
Albumin: 4.1 g/dL (ref 3.5–5.5)
Alkaline Phosphatase: 90 IU/L (ref 39–117)
BUN/Creatinine Ratio: 17 (ref 9–20)
BUN: 20 mg/dL (ref 6–24)
Bilirubin Total: 0.4 mg/dL (ref 0.0–1.2)
CALCIUM: 9.2 mg/dL (ref 8.7–10.2)
CO2: 21 mmol/L (ref 20–29)
Chloride: 104 mmol/L (ref 96–106)
Creatinine, Ser: 1.2 mg/dL (ref 0.76–1.27)
GFR, EST AFRICAN AMERICAN: 79 mL/min/{1.73_m2} (ref >59)
GFR, EST NON AFRICAN AMERICAN: 69 mL/min/{1.73_m2} (ref >59)
GLUCOSE: 114 mg/dL — AB (ref 65–99)
Globulin, Total: 3.5 g/dL (ref 1.5–4.5)
Potassium: 4.3 mmol/L (ref 3.5–5.2)
Sodium: 143 mmol/L (ref 134–144)
TOTAL PROTEIN: 7.6 g/dL (ref 6.0–8.5)

## 2016-12-24 LAB — HEMOGLOBIN A1C
Est. average glucose Bld gHb Est-mCnc: 163 mg/dL
Hgb A1c MFr Bld: 7.3 % — ABNORMAL HIGH (ref 4.8–5.6)

## 2016-12-24 NOTE — Progress Notes (Signed)
[name] Snow  MRN: [id number] DOB: 1965/06/23  PCP: [name] Bale, PA-C  Chief Complaint  Patient presents with  . Diabetes    follow up     Subjective:  Pt presents to clinic for DM and wound recheck.  He has not been checking his glucose at home as much.  He has returned to not eating as well.  He is having a lot of stress with his mother living in his home but it is the way is has to be right now.  Chronic pain - still wants to go off medications at some point but he still hurts all the time.  Peripheral neuropathy - he has stopped the gabapentin at night because it causes leg cramps - he is not really sure it helps anyway - he does still take the daytime doses - he is not sure that he   His wound continues to get better - he is out of supplies and the nurse is no longer coming to his house.  He has been changing the drsg daily.  History is obtained by patient.  Review of Systems  Constitutional: Negative for chills and fever.  Gastrointestinal: Negative for nausea.  Skin: Positive for wound.    Patient Active Problem List   Diagnosis Date Noted  . Acute osteomyelitis of metatarsal bone, right ([place]) 08/23/2016  . SIRS (systemic inflammatory response syndrome) ([place]) 08/22/2016  . HTN (hypertension) 07/22/2016  . Chronic pain syndrome 01/17/2016  . Pain in joint, shoulder region 06/29/2015  . Hyperlipidemia 10/17/2014  . Arteriosclerosis 06/17/2014  . Lung nodule-CT 3/15 06/17/2014  . Hepatomegaly-steatosis 08/08/2013  . DM neuropathy, painful ([place]) 10/19/2011  . Hip pain, chronic 10/19/2011  . Onychomycosis 10/19/2011  . Nephrolithiasis 10/19/2011  . Carpal tunnel syndrome 10/19/2011  . Obesity, Class III, BMI 40-49.9 (morbid obesity) ([place]) 04/30/2011  . DM type 2, uncontrolled, with neuropathy ([place]) 04/30/2011  . Gout 04/30/2011  . 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 Dulaglutide (TRULICITY) 1.5 IW/9.7LG SOPN Inject 1.5 Units into the skin once a week. 12 pen 0  . gabapentin (NEURONTIN) 400 MG capsule 1 po qam, 2 po q afternon and 2 po qhs 540 capsule 0  . glucose blood test strip Use as instructed 100 each 12  . Insulin Glargine (LANTUS SOLOSTAR) 100 UNIT/ML Solostar Pen Inject 36 Units into the skin 2 (two) times daily. 10 pen 2  . Lancets MISC 1 Device by Does not apply route 3 (three) times daily. E11.40 100 each 12  . lisinopril (PRINIVIL,ZESTRIL) 10 MG tablet Take 0.5 tablets (5 mg total) by mouth daily. (Patient taking differently: Take 5 mg by mouth every morning. ) 90 tablet 0  . rosuvastatin (CRESTOR) 20 MG tablet Take 1 tablet (20 mg total) by mouth every evening. 90 tablet 1  . insulin aspart (NOVOLOG) 100 UNIT/ML injection Inject 0-12 Units into the skin 3 (three) times daily with meals. Glucose 150-200 use 4 units, 201-250 use 6 units, 251-300 use 8 units, 301 and 350 use 10 units, 351 or greater use 12 units. (Patient not taking: Reported on 12/23/2016) 10 mL 0   No current facility-administered medications on file prior to visit.     Allergies  Allergen Reactions  . No Known Allergies     Past Medical History:  Diagnosis Date  . Arthritis    gout  . Cataract   .  CHF (congestive heart failure) ([place])    patient denies  . Chronic kidney disease   . Clotting disorder [facility])    patient denies  . Diabetes mellitus   . Gout   . History of kidney stones    stents placed prior to and removed  . Hypertension    Social History   Social History Narrative  . No narrative on file   Social History  Substance Use Topics  . Smoking status: Never Smoker  . Smokeless tobacco: Never Used  . Alcohol use No   family history includes COPD in his father; Hyperlipidemia in his mother; Hypertension in his mother; Mental illness in his brother, father, and mother.     Objective:  BP 133/69   Pulse 66   Temp 98.2 F (36.8  C) (Oral)   Resp 18   Ht 5' 10.8" (1.798 m)   Wt 248 lb 6.4 oz (112.7 kg)   SpO2 95%   BMI 34.84 kg/m  Body mass index is 34.84 kg/m.  Physical Exam  Constitutional: He is oriented to person, place, and time and well-developed, well-nourished, and in no distress.  HENT:  Head: Normocephalic and atraumatic.  Right Ear: External ear normal.  Left Ear: External ear normal.  Eyes: Conjunctivae are normal.  Neck: Normal range of motion.  Cardiovascular: Normal rate, regular rhythm and normal heart sounds.   Pulmonary/Chest: Effort normal and breath sounds normal.  Neurological: He is alert and oriented to person, place, and time. Gait normal.  Skin: Skin is warm and dry.  Psychiatric: Mood, memory, affect and judgment normal.        Assessment and Plan :  Essential hypertension - Plan: CMP14+EGFR -  Diabetic polyneuropathy associated with type 2 diabetes mellitus ([place])  DM type 2, uncontrolled, with neuropathy ([place]) - Plan: CMP14+EGFR, Hemoglobin A1c - pt is at a goal that is good for him - he was encouraged to continued this trend   Chronic pain syndrome - Plan: HYDROcodone-acetaminophen (NORCO) 10-325 MG tablet - refilled pain medication  Pure hypercholesterolemia - continue medication  Open wound of right foot, sequela - Plan: Gauze Pads & Dressings (NON-STICK PADS 3"X4") 3"X4" PADS, Elastic Bandages & Supports (NEXCARE COBAN WRAP 3"X5YD) MISC - looks much better - healing - pt to continue dressing changes and f/u in 1 month - the wound seems to be closing about 1 cm a month -  Windell Hummingbird PA-C  Primary Care at [facility] 12/24/2016 8:30 PM

## 2017-01-27 ENCOUNTER — Ambulatory Visit (INDEPENDENT_AMBULATORY_CARE_PROVIDER_SITE_OTHER): Payer: PPO | Admitting: Physician Assistant

## 2017-01-27 ENCOUNTER — Encounter: Payer: Self-pay | Admitting: Physician Assistant

## 2017-01-27 VITALS — BP 144/79 | HR 62 | Temp 97.4°F | Resp 18 | Ht 70.8 in | Wt 246.4 lb

## 2017-01-27 DIAGNOSIS — E1165 Type 2 diabetes mellitus with hyperglycemia: Secondary | ICD-10-CM | POA: Diagnosis not present

## 2017-01-27 DIAGNOSIS — G894 Chronic pain syndrome: Secondary | ICD-10-CM | POA: Diagnosis not present

## 2017-01-27 DIAGNOSIS — S91301S Unspecified open wound, right foot, sequela: Secondary | ICD-10-CM | POA: Diagnosis not present

## 2017-01-27 DIAGNOSIS — E1142 Type 2 diabetes mellitus with diabetic polyneuropathy: Secondary | ICD-10-CM

## 2017-01-27 DIAGNOSIS — Z23 Encounter for immunization: Secondary | ICD-10-CM | POA: Diagnosis not present

## 2017-01-27 DIAGNOSIS — I10 Essential (primary) hypertension: Secondary | ICD-10-CM

## 2017-01-27 DIAGNOSIS — E1140 Type 2 diabetes mellitus with diabetic neuropathy, unspecified: Secondary | ICD-10-CM

## 2017-01-27 DIAGNOSIS — IMO0002 Reserved for concepts with insufficient information to code with codable children: Secondary | ICD-10-CM

## 2017-01-27 MED ORDER — HYDROCODONE-ACETAMINOPHEN 10-325 MG PO TABS: 1.0000 | ORAL_TABLET | ORAL | 0 refills | Status: DC | PRN

## 2017-01-27 MED ORDER — GABAPENTIN 400 MG PO CAPS: ORAL_CAPSULE | ORAL | 0 refills | Status: DC

## 2017-01-27 NOTE — Progress Notes (Signed)
[name] Snow  MRN: [id number] DOB: 1965-07-03  PCP: Morrell Riddle, PA-C  Chief Complaint  Patient presents with  . Foot Injury    follow up     Subjective:  Pt presents to clinic for recheck on his foot.  He has not been wrapping with bandage material because he was never able to get any but he has been wearing a clean white sock. He has had almost no drainage and he has noticed no erythema. He feels like it is getting better and trying to keep it clean.  His glucose have been in the mid 100s.  He is trying to eat well.  History is obtained by patient.  Review of Systems  Constitutional: Negative for chills and fever.  Skin: Positive for wound.    Patient Active Problem List   Diagnosis Date Noted  . Acute osteomyelitis of metatarsal bone, right (HCC) 08/23/2016  . SIRS (systemic inflammatory response syndrome) (HCC) 08/22/2016  . HTN (hypertension) 07/22/2016  . Chronic pain syndrome 01/17/2016  . Pain in joint, shoulder region 06/29/2015  . Hyperlipidemia 10/17/2014  . Arteriosclerosis 06/17/2014  . Lung nodule-CT 3/15 06/17/2014  . Hepatomegaly-steatosis 08/08/2013  . DM neuropathy, painful (HCC) 10/19/2011  . Hip pain, chronic 10/19/2011  . Onychomycosis 10/19/2011  . Nephrolithiasis 10/19/2011  . Carpal tunnel syndrome 10/19/2011  . Obesity, Class III, BMI 40-49.9 (morbid obesity) (HCC) 04/30/2011  . DM type 2, uncontrolled, with neuropathy (HCC) 04/30/2011  . Gout 04/30/2011  . 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 Dulaglutide (TRULICITY) 1.5 MG/0.5ML SOPN Inject 1.5 Units into the skin once a week. 12 pen 0  . Elastic Bandages & Supports (LUMBAR BACK BRACE/SUPPORT PAD) MISC 1 Units by Does not apply route daily. 1 each 0  . Elastic Bandages & Supports (NEXCARE COBAN WRAP 3"X5YD) MISC 1 Units by Does not apply route daily. 3 each 3  . Gauze Pads &  Dressings (NON-STICK PADS 3"X4") 3"X4" PADS 1 Units by Does not apply route 2 (two) times daily. 180 each 2  . glucose blood test strip Use as instructed 100 each 12  . insulin aspart (NOVOLOG) 100 UNIT/ML injection Inject 0-12 Units into the skin 3 (three) times daily with meals. Glucose 150-200 use 4 units, 201-250 use 6 units, 251-300 use 8 units, 301 and 350 use 10 units, 351 or greater use 12 units. 10 mL 0  . Insulin Glargine (LANTUS SOLOSTAR) 100 UNIT/ML Solostar Pen Inject 36 Units into the skin 2 (two) times daily. 10 pen 2  . Lancets MISC 1 Device by Does not apply route 3 (three) times daily. E11.40 100 each 12  . lisinopril (PRINIVIL,ZESTRIL) 10 MG tablet Take 0.5 tablets (5 mg total) by mouth daily. (Patient taking differently: Take 5 mg by mouth every morning. ) 90 tablet 0  . rosuvastatin (CRESTOR) 20 MG tablet Take 1 tablet (20 mg total) by mouth every evening. 90 tablet 1   No current facility-administered medications on file prior to visit.     Allergies  Allergen Reactions  . No Known Allergies     Past Medical History:  Diagnosis Date  . Arthritis    gout  . Cataract   . CHF (congestive heart failure) (HCC)    patient denies  . Chronic kidney disease   . Clotting disorder [facility])    patient denies  . Diabetes mellitus   . Gout   .  History of kidney stones    stents placed prior to and removed  . Hypertension    Social History   Social History Narrative  . No narrative on file   Social History  Substance Use Topics  . Smoking status: Never Smoker  . Smokeless tobacco: Never Used  . Alcohol use No   family history includes COPD in his father; Hyperlipidemia in his mother; Hypertension in his mother; Mental illness in his brother, father, and mother.     Objective:  BP (!) 144/79   Pulse 62   Temp (!) 97.4 F (36.3 C) (Oral)   Resp 18   Ht 5' 10.8" (1.798 m)   Wt 246 lb 6.4 oz (111.8 kg)   SpO2 94%   BMI 34.56 kg/m  Body mass index is 34.56  kg/m.  Physical Exam  Constitutional: He is oriented to person, place, and time and well-developed, well-nourished, and in no distress.  HENT:  Head: Normocephalic and atraumatic.  Right Ear: External ear normal.  Left Ear: External ear normal.  Eyes: Conjunctivae are normal.  Neck: Normal range of motion.  Cardiovascular: Normal rate, regular rhythm and normal heart sounds.   No murmur heard. Pulmonary/Chest: Effort normal and breath sounds normal. He has no wheezes.  Neurological: He is alert and oriented to person, place, and time. Gait normal.  Skin: Skin is warm and dry.  Psychiatric: Mood, memory, affect and judgment normal.        Assessment and Plan :   Problem List Items Addressed This Visit      Cardiovascular and Mediastinum   HTN (hypertension)    Not well controlled today.  He has been under stress and was rushing to get here on time.        Endocrine   DM type 2, uncontrolled, with neuropathy (HCC)    Continue current plan with medications.  Continue to watch eating and monitoring glucose.  Recheck in 6 weeks and labs will be done at that time.  Encouraged patient to keep good DM control as his foot is healing.      DM neuropathy, painful (HCC)   Relevant Medications   gabapentin (NEURONTIN) 400 MG capsule     Other   Open wound of right foot - Primary    Much improved from last visit - good granulation tissue and no signs of infection.      Chronic pain syndrome    Medications refilled.      Relevant Medications   HYDROcodone-acetaminophen (NORCO) 10-325 MG tablet    Other Visit Diagnoses    Need for influenza vaccination       Relevant Orders   Flu Vaccine QUAD 36+ mos IM (Completed)      Benny LennertSarah Khaidyn Staebell PA-C  [facility] Care at [facility] 01/28/2017 1:06 PM

## 2017-01-27 NOTE — Patient Instructions (Signed)
     IF you received an x-ray today, you will receive an invoice from [facility]. Please contact [facility] at [phone] with questions or concerns regarding your invoice.   IF you received labwork today, you will receive an invoice from [facility]. Please contact [facility] at [phone] with questions or concerns regarding your invoice.   Our billing staff will not be able to assist you with questions regarding bills from these companies.  You will be contacted with the lab results as soon as they are available. The fastest way to get your results is to activate your My Chart account. Instructions are located on the last page of this paperwork. If you have not heard from us regarding the results in 2 weeks, please contact this office.     

## 2017-01-28 DIAGNOSIS — S91301A Unspecified open wound, right foot, initial encounter: Secondary | ICD-10-CM | POA: Insufficient documentation

## 2017-01-28 NOTE — Assessment & Plan Note (Signed)
Much improved from last visit - good granulation tissue and no signs of infection.

## 2017-01-28 NOTE — Assessment & Plan Note (Signed)
Continue current plan with medications.  Continue to watch eating and monitoring glucose.  Recheck in 6 weeks and labs will be done at that time.  Encouraged patient to keep good DM control as his foot is healing.

## 2017-01-28 NOTE — Assessment & Plan Note (Signed)
Medications refilled

## 2017-01-28 NOTE — Assessment & Plan Note (Signed)
Not well controlled today.  He has been under stress and was rushing to get here on time.

## 2017-02-17 ENCOUNTER — Telehealth: Payer: Self-pay

## 2017-02-17 NOTE — Telephone Encounter (Signed)
Called pt to schedule Medicare Annual Wellness Visit. -nr  

## 2017-03-03 ENCOUNTER — Encounter: Payer: Self-pay | Admitting: Physician Assistant

## 2017-03-03 ENCOUNTER — Ambulatory Visit (INDEPENDENT_AMBULATORY_CARE_PROVIDER_SITE_OTHER): Payer: PPO | Admitting: Physician Assistant

## 2017-03-03 VITALS — BP 126/70 | HR 58 | Temp 97.9°F | Resp 18 | Ht 70.8 in | Wt 253.6 lb

## 2017-03-03 DIAGNOSIS — G894 Chronic pain syndrome: Secondary | ICD-10-CM

## 2017-03-03 DIAGNOSIS — E1165 Type 2 diabetes mellitus with hyperglycemia: Secondary | ICD-10-CM

## 2017-03-03 DIAGNOSIS — S91301S Unspecified open wound, right foot, sequela: Secondary | ICD-10-CM

## 2017-03-03 DIAGNOSIS — G8929 Other chronic pain: Secondary | ICD-10-CM

## 2017-03-03 DIAGNOSIS — IMO0002 Reserved for concepts with insufficient information to code with codable children: Secondary | ICD-10-CM

## 2017-03-03 DIAGNOSIS — Z6379 Other stressful life events affecting family and household: Secondary | ICD-10-CM | POA: Diagnosis not present

## 2017-03-03 DIAGNOSIS — E1142 Type 2 diabetes mellitus with diabetic polyneuropathy: Secondary | ICD-10-CM

## 2017-03-03 DIAGNOSIS — E7800 Pure hypercholesterolemia, unspecified: Secondary | ICD-10-CM | POA: Diagnosis not present

## 2017-03-03 DIAGNOSIS — M25559 Pain in unspecified hip: Secondary | ICD-10-CM

## 2017-03-03 DIAGNOSIS — I10 Essential (primary) hypertension: Secondary | ICD-10-CM

## 2017-03-03 DIAGNOSIS — L298 Other pruritus: Secondary | ICD-10-CM | POA: Diagnosis not present

## 2017-03-03 DIAGNOSIS — B356 Tinea cruris: Secondary | ICD-10-CM

## 2017-03-03 DIAGNOSIS — E1140 Type 2 diabetes mellitus with diabetic neuropathy, unspecified: Secondary | ICD-10-CM | POA: Diagnosis not present

## 2017-03-03 DIAGNOSIS — Z636 Dependent relative needing care at home: Secondary | ICD-10-CM

## 2017-03-03 MED ORDER — HYDROCODONE-ACETAMINOPHEN 10-325 MG PO TABS: 1.0000 | ORAL_TABLET | ORAL | 0 refills | Status: DC | PRN

## 2017-03-03 MED ORDER — CLOTRIMAZOLE-BETAMETHASONE 1-0.05 % EX CREA: 1.0000 "application " | TOPICAL_CREAM | Freq: Two times a day (BID) | CUTANEOUS | 1 refills | Status: DC

## 2017-03-03 NOTE — Patient Instructions (Signed)
     IF you received an x-ray today, you will receive an invoice from [facility]. Please contact [facility] at [phone] with questions or concerns regarding your invoice.   IF you received labwork today, you will receive an invoice from [facility]. Please contact [facility] at [phone] with questions or concerns regarding your invoice.   Our billing staff will not be able to assist you with questions regarding bills from these companies.  You will be contacted with the lab results as soon as they are available. The fastest way to get your results is to activate your My Chart account. Instructions are located on the last page of this paperwork. If you have not heard from us regarding the results in 2 weeks, please contact this office.     

## 2017-03-03 NOTE — Progress Notes (Signed)
[name] Snow  MRN: [id number] DOB: [birth year]  PCP: Mancel Bale, PA-C  Chief Complaint  Patient presents with  . Medication Refill    all meds     Subjective:  Pt presents to clinic for medication refills and current financial problems and family stress due to mother's health conditions.  He is no longer able to take care of her with her current dementia and incontinence.  Her supplies are no longer being provided and he is having trouble getting her set up with living facility.  Due to his current situation he is not taking care of himself - he does take his medication as Rx but he is not eating well.  He wife has not come home and he can hardly afford the large house they bought together on his fixed income but he does not want to get rid of the house in case she wants to come back but he knows that if his mother is in the house she will not come back.  His brother is hours away and he has helped in the past with his mother.  Currently he has been up on his foot more than normal and he has not been taking great care of it like keeping a dressing on it and he noticed today that the center does not look as good as it did last week.  History is obtained by patient.  Review of Systems  Constitutional: Negative for chills and fever.  Cardiovascular: Negative for chest pain and leg swelling.  Musculoskeletal: Positive for gait problem (2nd to foot problems).       Phantom pains on left foot where 4th/5th digit is missing  Neurological: Negative for numbness.  Psychiatric/Behavioral: Positive for sleep disturbance (2nd to stress).    Patient Active Problem List   Diagnosis Date Noted  . Open wound of right foot 01/28/2017  . History of osteomyelitis 08/23/2016  . SIRS (systemic inflammatory response syndrome) ([place]) 08/22/2016  . HTN (hypertension) 07/22/2016  . Chronic pain syndrome 01/17/2016  . Pain in joint, shoulder region 06/29/2015  . Hyperlipidemia 10/17/2014  .  Arteriosclerosis 06/17/2014  . Lung nodule-CT 3/15 06/17/2014  . Hepatomegaly-steatosis 08/08/2013  . DM neuropathy, painful ([place]) 10/19/2011  . Hip pain, chronic 10/19/2011  . Onychomycosis 10/19/2011  . Nephrolithiasis 10/19/2011  . Carpal tunnel syndrome 10/19/2011  . Obesity, Class III, BMI 40-49.9 (morbid obesity) ([place]) 04/30/2011  . DM type 2, uncontrolled, with neuropathy ([place]) 04/30/2011  . Gout 04/30/2011  . 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 Dulaglutide (TRULICITY) 1.5 AC/1.6SA SOPN Inject 1.5 Units into the skin once a week. 12 pen 0  . gabapentin (NEURONTIN) 400 MG capsule 1 po qam, 2 po q afternon and 2 po qhs 540 capsule 0  . glucose blood test strip Use as instructed 100 each 12  . insulin aspart (NOVOLOG) 100 UNIT/ML injection Inject 0-12 Units into the skin 3 (three) times daily with meals. Glucose 150-200 use 4 units, 201-250 use 6 units, 251-300 use 8 units, 301 and 350 use 10 units, 351 or greater use 12 units. 10 mL 0  . Insulin Glargine (LANTUS SOLOSTAR) 100 UNIT/ML Solostar Pen Inject 36 Units into the skin 2 (two) times daily. 10 pen 2  . Lancets MISC 1 Device by Does not apply route 3 (three) times daily. E11.40 100 each 12  . lisinopril (PRINIVIL,ZESTRIL) 10 MG tablet  Take 0.5 tablets (5 mg total) by mouth daily. (Patient taking differently: Take 5 mg by mouth every morning. ) 90 tablet 0  . rosuvastatin (CRESTOR) 20 MG tablet Take 1 tablet (20 mg total) by mouth every evening. 90 tablet 1   No current facility-administered medications on file prior to visit.     Allergies  Allergen Reactions  . No Known Allergies     Past Medical History:  Diagnosis Date  . Arthritis    gout  . Cataract   . CHF (congestive heart failure) (HCC)    patient denies  . Chronic kidney disease   . Clotting disorder [facility])    patient denies  . Diabetes mellitus   .  Gout   . History of kidney stones    stents placed prior to and removed  . Hypertension    Social History   Social History Narrative  . No narrative on file   Social History  Substance Use Topics  . Smoking status: Never Smoker  . Smokeless tobacco: Never Used  . Alcohol use No   family history includes COPD in his father; Hyperlipidemia in his mother; Hypertension in his mother; Mental illness in his brother, father, and mother.     Objective:  BP 126/70   Pulse (!) 58   Temp 97.9 F (36.6 C) (Oral)   Resp 18   Ht 5' 10.8" (1.798 m)   Wt 253 lb 9.6 oz (115 kg)   SpO2 97%   BMI 35.57 kg/m  Body mass index is 35.57 kg/m.  Physical Exam  Constitutional: He is oriented to person, place, and time and well-developed, well-nourished, and in no distress.  HENT:  Head: Normocephalic and atraumatic.  Right Ear: External ear normal.  Left Ear: External ear normal.  Eyes: Conjunctivae are normal.  Neck: Normal range of motion.  Cardiovascular: Normal rate, regular rhythm, normal heart sounds and intact distal pulses.   Pulmonary/Chest: Effort normal and breath sounds normal. He has no wheezes.  Musculoskeletal:       Right lower leg: He exhibits no edema.       Left lower leg: He exhibits no edema.  Neurological: He is alert and oriented to person, place, and time. Gait normal.  Skin: Skin is warm and dry.  Left foot wound - not much improvement from the last visit - maybe slightly worse in regards to slightly deeper but the callused surrounding skin does look thicker than it has and there is more eschar present.  Psychiatric: Mood, memory, affect and judgment normal.   Wt Readings from Last 3 Encounters:  03/03/17 253 lb 9.6 oz (115 kg)  01/27/17 246 lb 6.4 oz (111.8 kg)  12/23/16 248 lb 6.4 oz (112.7 kg)       Assessment and Plan :  Essential hypertension - controlled - continue medications  Chronic pain syndrome - Plan: HYDROcodone-acetaminophen (NORCO) 10-325  MG tablet - continue current medications   Diabetic polyneuropathy associated with type 2 diabetes mellitus (HCC) -   DM type 2, uncontrolled, with neuropathy (HCC) - Plan: Hemoglobin A1c, CMP14+EGFR - check labs - continue medications will adjust if needed based on lab results  Chronic hip pain, unspecified laterality  Pure hypercholesterolemia - Plan: Lipid panel - check labs  Open wound of right foot, sequela - I am concerned that the patient is up on his foot and putting more pressure on the area and maybe decreasing the healing at this time - I also suspect that he  is not taking as good care of it as he has been due to his recent increase in stress in dealing with his sick mother  Jock itch - Plan: clotrimazole-betamethasone (LOTRISONE) cream - has used this cream in the past and it has helped a lot  Family stress/ caregiver burnout/burden - pt is no longer able to take care of his mother financially or physically.  He needs some help.  He has no local family to help him and she needs to place in a care facility but he cannot afford it and she has no money (she is on food stamps as it is and many months she does not get the money due to a problems in the system per patient).    Windell Hummingbird PA-C  Primary Care at [facility] 03/06/2017 4:40 PM

## 2017-03-04 LAB — CMP14+EGFR
ALT: 12 IU/L (ref 0–44)
AST: 14 IU/L (ref 0–40)
Albumin/Globulin Ratio: 1.3 (ref 1.2–2.2)
Albumin: 4.2 g/dL (ref 3.5–5.5)
Alkaline Phosphatase: 91 IU/L (ref 39–117)
BUN/Creatinine Ratio: 25 — ABNORMAL HIGH (ref 9–20)
BUN: 28 mg/dL — AB (ref 6–24)
Bilirubin Total: 0.4 mg/dL (ref 0.0–1.2)
CALCIUM: 8.9 mg/dL (ref 8.7–10.2)
CO2: 20 mmol/L (ref 20–29)
CREATININE: 1.14 mg/dL (ref 0.76–1.27)
Chloride: 101 mmol/L (ref 96–106)
GFR calc Af Amer: 84 mL/min/{1.73_m2} (ref >59)
GFR, EST NON AFRICAN AMERICAN: 73 mL/min/{1.73_m2} (ref >59)
Globulin, Total: 3.3 g/dL (ref 1.5–4.5)
Glucose: 114 mg/dL — ABNORMAL HIGH (ref 65–99)
Potassium: 4.8 mmol/L (ref 3.5–5.2)
Sodium: 139 mmol/L (ref 134–144)
Total Protein: 7.5 g/dL (ref 6.0–8.5)

## 2017-03-04 LAB — HEMOGLOBIN A1C
ESTIMATED AVERAGE GLUCOSE: 157 mg/dL
HEMOGLOBIN A1C: 7.1 % — AB (ref 4.8–5.6)

## 2017-03-04 LAB — LIPID PANEL
Chol/HDL Ratio: 5.1 ratio — ABNORMAL HIGH (ref 0.0–5.0)
Cholesterol, Total: 205 mg/dL — ABNORMAL HIGH (ref 100–199)
HDL: 40 mg/dL (ref >39)
LDL CALC: 125 mg/dL — AB (ref 0–99)
TRIGLYCERIDES: 198 mg/dL — AB (ref 0–149)
VLDL Cholesterol Cal: 40 mg/dL (ref 5–40)

## 2017-03-05 NOTE — Telephone Encounter (Signed)
Additional outreach Called pt to schedule Medicare Annual Wellness Visit. -nr

## 2017-03-17 ENCOUNTER — Encounter: Payer: Self-pay | Admitting: Physician Assistant

## 2017-03-17 ENCOUNTER — Ambulatory Visit (INDEPENDENT_AMBULATORY_CARE_PROVIDER_SITE_OTHER): Payer: PPO | Admitting: Physician Assistant

## 2017-03-17 ENCOUNTER — Ambulatory Visit (INDEPENDENT_AMBULATORY_CARE_PROVIDER_SITE_OTHER): Payer: PPO

## 2017-03-17 VITALS — BP 132/66 | HR 72 | Temp 98.1°F | Resp 16 | Ht 70.8 in | Wt 252.6 lb

## 2017-03-17 DIAGNOSIS — E1165 Type 2 diabetes mellitus with hyperglycemia: Secondary | ICD-10-CM | POA: Diagnosis not present

## 2017-03-17 DIAGNOSIS — IMO0002 Reserved for concepts with insufficient information to code with codable children: Secondary | ICD-10-CM

## 2017-03-17 DIAGNOSIS — M79671 Pain in right foot: Secondary | ICD-10-CM | POA: Diagnosis not present

## 2017-03-17 DIAGNOSIS — S91301S Unspecified open wound, right foot, sequela: Secondary | ICD-10-CM

## 2017-03-17 DIAGNOSIS — M19072 Primary osteoarthritis, left ankle and foot: Secondary | ICD-10-CM | POA: Diagnosis not present

## 2017-03-17 DIAGNOSIS — E1140 Type 2 diabetes mellitus with diabetic neuropathy, unspecified: Secondary | ICD-10-CM | POA: Diagnosis not present

## 2017-03-17 MED ORDER — "MEDIHONEY CA ALGINATE 2""X2"" EX PADS": 1.0000 | MEDICATED_PAD | CUTANEOUS | 0 refills | Status: DC

## 2017-03-17 NOTE — Patient Instructions (Signed)
     IF you received an x-ray today, you will receive an invoice from [facility]. Please contact [facility] at [phone] with questions or concerns regarding your invoice.   IF you received labwork today, you will receive an invoice from [facility]. Please contact [facility] at [phone] with questions or concerns regarding your invoice.   Our billing staff will not be able to assist you with questions regarding bills from these companies.  You will be contacted with the lab results as soon as they are available. The fastest way to get your results is to activate your My Chart account. Instructions are located on the last page of this paperwork. If you have not heard from us regarding the results in 2 weeks, please contact this office.     

## 2017-03-17 NOTE — Progress Notes (Signed)
[name] Snow  MRN: [id number] DOB: Oct 31, 1965  PCP: Morrell Riddle, PA-C  Chief Complaint  Patient presents with  . Follow-up    recheck foot     Subjective:  Pt presents to clinic for recheck of his foot.  He has been trying to take better care of it but does admit that he thinks that it might be more wet than it should be - he is also up on it more than he knows he should but he has no choice.  He still has a lot of stressors.  He is trying to eat ok but he still eats about once a day.  He is checking his sugars randomly but he is taking his medication.  History is obtained by patient.  Review of Systems  Constitutional: Negative for chills and fever.  Skin: Positive for wound.    Patient Active Problem List   Diagnosis Date Noted  . Open wound of right foot 01/28/2017  . History of osteomyelitis 08/23/2016  . SIRS (systemic inflammatory response syndrome) (HCC) 08/22/2016  . HTN (hypertension) 07/22/2016  . Chronic pain syndrome 01/17/2016  . Pain in joint, shoulder region 06/29/2015  . Hyperlipidemia 10/17/2014  . Arteriosclerosis 06/17/2014  . Lung nodule-CT 3/15 06/17/2014  . Hepatomegaly-steatosis 08/08/2013  . DM neuropathy, painful (HCC) 10/19/2011  . Hip pain, chronic 10/19/2011  . Onychomycosis 10/19/2011  . Nephrolithiasis 10/19/2011  . Carpal tunnel syndrome 10/19/2011  . Obesity, Class III, BMI 40-49.9 (morbid obesity) (HCC) 04/30/2011  . DM type 2, uncontrolled, with neuropathy (HCC) 04/30/2011  . Gout 04/30/2011  . 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 clotrimazole-betamethasone (LOTRISONE) cream Apply 1 application topically 2 (two) times daily. 30 g 1  . Dulaglutide (TRULICITY) 1.5 MG/0.5ML SOPN Inject 1.5 Units into the skin once a week. 12 pen 0  . gabapentin (NEURONTIN) 400 MG capsule 1 po qam, 2 po q afternon and 2 po qhs 540 capsule 0    . glucose blood test strip Use as instructed 100 each 12  . HYDROcodone-acetaminophen (NORCO) 10-325 MG tablet Take 1 tablet by mouth every 4 (four) hours as needed. 180 tablet 0  . insulin aspart (NOVOLOG) 100 UNIT/ML injection Inject 0-12 Units into the skin 3 (three) times daily with meals. Glucose 150-200 use 4 units, 201-250 use 6 units, 251-300 use 8 units, 301 and 350 use 10 units, 351 or greater use 12 units. 10 mL 0  . Insulin Glargine (LANTUS SOLOSTAR) 100 UNIT/ML Solostar Pen Inject 36 Units into the skin 2 (two) times daily. 10 pen 2  . Lancets MISC 1 Device by Does not apply route 3 (three) times daily. E11.40 100 each 12  . lisinopril (PRINIVIL,ZESTRIL) 10 MG tablet Take 0.5 tablets (5 mg total) by mouth daily. (Patient taking differently: Take 5 mg by mouth every morning. ) 90 tablet 0  . rosuvastatin (CRESTOR) 20 MG tablet Take 1 tablet (20 mg total) by mouth every evening. 90 tablet 1   No current facility-administered medications on file prior to visit.     Allergies  Allergen Reactions  . No Known Allergies     Past Medical History:  Diagnosis Date  . Arthritis    gout  . Cataract   . CHF (congestive heart failure) (HCC)    patient denies  . Chronic kidney disease   . Clotting disorder [facility])    patient denies  .  Diabetes mellitus   . Gout   . History of kidney stones    stents placed prior to and removed  . Hypertension    Social History   Social History Narrative  . No narrative on file   Social History  Substance Use Topics  . Smoking status: Never Smoker  . Smokeless tobacco: Never Used  . Alcohol use No   family history includes COPD in his father; Hyperlipidemia in his mother; Hypertension in his mother; Mental illness in his brother, father, and mother.     Objective:  BP 132/66   Pulse 72   Temp 98.1 F (36.7 C)   Resp 16   Ht 5' 10.8" (1.798 m)   Wt 252 lb 9.6 oz (114.6 kg)   SpO2 96%   BMI 35.43 kg/m  Body mass index is 35.43  kg/m.  Physical Exam  Constitutional: He is oriented to person, place, and time and well-developed, well-nourished, and in no distress.  HENT:  Head: Normocephalic and atraumatic.  Right Ear: External ear normal.  Left Ear: External ear normal.  Eyes: Conjunctivae are normal.  Neck: Normal range of motion.  Pulmonary/Chest: Effort normal.  Neurological: He is alert and oriented to person, place, and time. Gait normal.  Skin: Skin is warm and dry.  Psychiatric: Mood, memory, affect and judgment normal.      Assessment and Plan :  DM type 2, uncontrolled, with neuropathy (HCC)  Open wound of right foot, sequela - Plan: DG Foot Complete Left, Wound Dressings (MEDIHONEY CA ALGINATE 2"X2") PADS  Right foot pain - Plan: DG Foot Complete Left   There is more maceration on the foot.  He is not interested in going to wound care at this time.  He was encouraged to keep it as dry as possible and we will switch type of dressing and he will recheck with me in a week.  I am concerned because the cough is getting deeper but there is no surrounding erythema.  D/w Dr Coy SaunasSantigo  Herberta Pickron PA-C  Primary Care at [facility] 03/17/2017 5:27 PM

## 2017-03-23 ENCOUNTER — Encounter: Payer: Self-pay | Admitting: Physician Assistant

## 2017-03-24 ENCOUNTER — Ambulatory Visit (INDEPENDENT_AMBULATORY_CARE_PROVIDER_SITE_OTHER): Payer: PPO

## 2017-03-24 ENCOUNTER — Ambulatory Visit: Payer: PPO | Admitting: Physician Assistant

## 2017-03-24 ENCOUNTER — Encounter: Payer: Self-pay | Admitting: Physician Assistant

## 2017-03-24 VITALS — BP 104/72 | HR 61 | Temp 97.8°F | Resp 18 | Ht 71.0 in | Wt 248.4 lb

## 2017-03-24 VITALS — BP 104/72 | HR 86 | Temp 97.8°F | Ht 71.0 in | Wt 248.1 lb

## 2017-03-24 DIAGNOSIS — E1165 Type 2 diabetes mellitus with hyperglycemia: Secondary | ICD-10-CM | POA: Diagnosis not present

## 2017-03-24 DIAGNOSIS — IMO0002 Reserved for concepts with insufficient information to code with codable children: Secondary | ICD-10-CM

## 2017-03-24 DIAGNOSIS — E1142 Type 2 diabetes mellitus with diabetic polyneuropathy: Secondary | ICD-10-CM | POA: Diagnosis not present

## 2017-03-24 DIAGNOSIS — G894 Chronic pain syndrome: Secondary | ICD-10-CM

## 2017-03-24 DIAGNOSIS — S91301S Unspecified open wound, right foot, sequela: Secondary | ICD-10-CM

## 2017-03-24 DIAGNOSIS — Z0000 Encounter for general adult medical examination without abnormal findings: Secondary | ICD-10-CM

## 2017-03-24 DIAGNOSIS — Z1211 Encounter for screening for malignant neoplasm of colon: Secondary | ICD-10-CM | POA: Diagnosis not present

## 2017-03-24 DIAGNOSIS — E1140 Type 2 diabetes mellitus with diabetic neuropathy, unspecified: Secondary | ICD-10-CM | POA: Diagnosis not present

## 2017-03-24 MED ORDER — HYDROCODONE-ACETAMINOPHEN 10-325 MG PO TABS: 1.0000 | ORAL_TABLET | ORAL | 0 refills | Status: DC | PRN

## 2017-03-24 NOTE — Patient Instructions (Signed)
     IF you received an x-ray today, you will receive an invoice from [facility]. Please contact [facility] at [phone] with questions or concerns regarding your invoice.   IF you received labwork today, you will receive an invoice from [facility]. Please contact [facility] at [phone] with questions or concerns regarding your invoice.   Our billing staff will not be able to assist you with questions regarding bills from these companies.  You will be contacted with the lab results as soon as they are available. The fastest way to get your results is to activate your My Chart account. Instructions are located on the last page of this paperwork. If you have not heard from us regarding the results in 2 weeks, please contact this office.     

## 2017-03-24 NOTE — Progress Notes (Signed)
Subjective:   Karl Baleshomas G Lacher is a 51 y.o. male who presents for an Initial Medicare Annual Wellness Visit.  Review of Systems  N/A Cardiac Risk Factors include: diabetes mellitus;dyslipidemia;hypertension;obesity (BMI >30kg/m2);male gender    Objective:    Today's Vitals   03/24/17 1425  BP: 104/72  Pulse: 86  Temp: 97.8 F (36.6 C)  TempSrc: Oral  SpO2: 96%  Weight: 248 lb 2 oz (112.5 kg)  Height: 5\' 11"  (1.803 m)   Body mass index is 34.61 kg/m.  Current Medications (verified) Outpatient Encounter Medications as of 03/24/2017  Medication Sig  . clotrimazole-betamethasone (LOTRISONE) cream Apply 1 application topically 2 (two) times daily.  . Dulaglutide (TRULICITY) 1.5 MG/0.5ML SOPN Inject 1.5 Units into the skin once a week.  . gabapentin (NEURONTIN) 400 MG capsule 1 po qam, 2 po q afternon and 2 po qhs  . glucose blood test strip Use as instructed  . HYDROcodone-acetaminophen (NORCO) 10-325 MG tablet Take 1 tablet by mouth every 4 (four) hours as needed.  . insulin aspart (NOVOLOG) 100 UNIT/ML injection Inject 0-12 Units into the skin 3 (three) times daily with meals. Glucose 150-200 use 4 units, 201-250 use 6 units, 251-300 use 8 units, 301 and 350 use 10 units, 351 or greater use 12 units.  . Insulin Glargine (LANTUS SOLOSTAR) 100 UNIT/ML Solostar Pen Inject 36 Units into the skin 2 (two) times daily.  . Lancets MISC 1 Device by Does not apply route 3 (three) times daily. E11.40  . lisinopril (PRINIVIL,ZESTRIL) 10 MG tablet Take 0.5 tablets (5 mg total) by mouth daily. (Patient taking differently: Take 5 mg by mouth every morning. )  . rosuvastatin (CRESTOR) 20 MG tablet Take 1 tablet (20 mg total) by mouth every evening.  . Wound Dressings (MEDIHONEY CA ALGINATE 2"X2") PADS Apply 1 each topically every 3 (three) days.   No facility-administered encounter medications on file as of 03/24/2017.     Allergies (verified) No known allergies   History: Past Medical  History:  Diagnosis Date  . Arthritis    gout  . Cataract   . CHF (congestive heart failure) (HCC)    patient denies  . Chronic kidney disease   . Clotting disorder [facility](HCC)    patient denies  . Diabetes mellitus   . Gout   . History of kidney stones    stents placed prior to and removed  . Hypertension    Past Surgical History:  Procedure Laterality Date  . JOINT REPLACEMENT  [id number]   R Hip  . kidney stents     Family History  Problem Relation Age of Onset  . Mental illness Mother   . Hypertension Mother   . Hyperlipidemia Mother   . Mental illness Father   . COPD Father   . Mental illness Brother    Social History   Occupational History  . Not on file  Tobacco Use  . Smoking status: Never Smoker  . Smokeless tobacco: Never Used  Substance and Sexual Activity  . Alcohol use: No  . Drug use: No  . Sexual activity: No    Birth control/protection: None   Tobacco Counseling Counseling given: Not Answered   Activities of Daily Living In your present state of health, do you have any difficulty performing the following activities: 03/24/2017 09/04/2016  Hearing? N N  Vision? N N  Difficulty concentrating or making decisions? N N  Walking or climbing stairs? N Y  Dressing or bathing? N N  Doing errands,  shopping? N -  Preparing Food and eating ? N -  Using the Toilet? N -  In the past six months, have you accidently leaked urine? N -  Do you have problems with loss of bowel control? N -  Managing your Medications? N -  Managing your Finances? N -  Housekeeping or managing your Housekeeping? N -  Some recent data might be hidden    Immunizations and Health Maintenance Immunization History  Administered Date(s) Administered  . Influenza Split 03/29/2012  . Influenza,inj,Quad PF,6+ Mos 02/11/2013, 03/21/2015, 01/27/2017  . Pneumococcal Polysaccharide-23 12/14/2015  . Tdap 12/21/2014   Health Maintenance Due  Topic Date Due  . OPHTHALMOLOGY EXAM  12/07/1973   . COLONOSCOPY  12/07/2013    Patient Care Team: Larkin InaWeber, Sarah L, PA-C as PCP - General (Physician Assistant)  Indicate any recent Medical Services you may have received from other than [facility] providers in the past year (date may be approximate).    Assessment:   This is a routine wellness [facility].   Hearing/Vision screen Hearing Screening Comments: Patient has not had a hearing test. Vision Screening Comments: Patient sees an eye doctor every year for his routine eye exams.   Dietary issues and exercise activities discussed: Current Exercise Habits: The patient does not participate in regular exercise at present(stays active at home ), Exercise limited by: None identified  Goals    . Weight (lb) < 228 lb (103.4 kg)     Patient states that he wants to try to lose around 20 lbs in the near future.       Depression Screen PHQ 2/9 Scores 03/24/2017 03/17/2017 03/03/2017 01/27/2017  PHQ - 2 Score 0 0 0 0    Fall Risk Fall Risk  03/24/2017 03/17/2017 03/03/2017 01/27/2017 09/30/2016  Falls in the past year? No No No No No  Number falls in past yr: - - - - -  Injury with Fall? - - - - -    Cognitive Function:     6CIT Screen 03/24/2017  What Year? 0 points  What month? 0 points  What time? 0 points  Count back from 20 0 points  Months in reverse 0 points  Repeat phrase 0 points  Total Score 0    Screening Tests Health Maintenance  Topic Date Due  . OPHTHALMOLOGY EXAM  12/07/1973  . COLONOSCOPY  12/07/2013  . HEMOGLOBIN A1C  09/01/2017  . FOOT EXAM  03/06/2018  . PNEUMOCOCCAL POLYSACCHARIDE VACCINE (2) 12/13/2020  . TETANUS/TDAP  12/20/2024  . INFLUENZA VACCINE  Completed  . Hepatitis C Screening  Completed  . HIV Screening  Completed        Plan:   I have personally reviewed and noted the following in the patient's chart:   . Medical and social history . Use of alcohol, tobacco or illicit drugs  . Current medications and supplements . Functional  ability and status . Nutritional status . Physical activity . Advanced directives . List of other physicians . Hospitalizations, surgeries, and ER visits in previous 12 months . Vitals . Screenings to include cognitive, depression, and falls . Referrals and appointments  In addition, I have reviewed and discussed with patient certain preventive protocols, quality metrics, and best practice recommendations. A written personalized care plan for preventive services as well as general preventive health recommendations were provided to patient.   1. Encounter for Medicare annual wellness exam  2. Special screening for malignant neoplasms, colon - Ambulatory referral to Gastroenterology (for  Colonoscopy)  Janalyn Shy, LPN   [id number]

## 2017-03-24 NOTE — Patient Instructions (Addendum)
[name] , Thank you for taking time to come for your Medicare Wellness Visit. I appreciate your ongoing commitment to your health goals. Please review the following plan we discussed and let me know if I can assist you in the future.   Screening recommendations/referrals: Colonoscopy: due, [facility] will contact you to setup appointment.  Recommended yearly ophthalmology/optometry visit for glaucoma screening and checkup Recommended yearly dental visit for hygiene and checkup  Vaccinations: Influenza vaccine: up to date Pneumococcal vaccine: up to date, Prevnar 4713 @ age 53 Tdap vaccine: up to date, next due 12/20/2024 Shingles vaccine: Check with your pharmacy about receiving this vaccine    Advanced directives: Advance directive discussed with you today. Even though you declined this today please call our office should you change your mind and we can give you the proper paperwork for you to fill out.   Conditions/risks identified: Try to lose around 20 lbs in the near future.   Next appointment: 03/24/17 @ 3:40 pm with [name] Snow  Preventive Care 40-64 Years, Male Preventive care refers to lifestyle choices and visits with your health care provider that can promote health and wellness. What does preventive care include?  A yearly physical exam. This is also called an annual well check.  Dental exams once or twice a year.  Routine eye exams. Ask your health care provider how often you should have your eyes checked.  Personal lifestyle choices, including:  Daily care of your teeth and gums.  Regular physical activity.  Eating a healthy diet.  Avoiding tobacco and drug use.  Limiting alcohol use.  Practicing safe sex.  Taking low-dose aspirin every day starting at age 53. What happens during an annual well check? The services and screenings done by your health care provider during your annual well check will depend on your age, overall health, lifestyle risk factors,  and family history of disease. Counseling  Your health care provider may ask you questions about your:  Alcohol use.  Tobacco use.  Drug use.  Emotional well-being.  Home and relationship well-being.  Sexual activity.  Eating habits.  Work and work Astronomerenvironment. Screening  You may have the following tests or measurements:  Height, weight, and BMI.  Blood pressure.  Lipid and cholesterol levels. These may be checked every 5 years, or more frequently if you are over 53 years old.  Skin check.  Lung cancer screening. You may have this screening every year starting at age 53 if you have a 30-pack-year history of smoking and currently smoke or have quit within the past 15 years.  Fecal occult blood test (FOBT) of the stool. You may have this test every year starting at age 53.  Flexible sigmoidoscopy or colonoscopy. You may have a sigmoidoscopy every 5 years or a colonoscopy every 10 years starting at age 53.  Prostate cancer screening. Recommendations will vary depending on your family history and other risks.  Hepatitis C blood test.  Hepatitis B blood test.  Sexually transmitted disease (STD) testing.  Diabetes screening. This is done by checking your blood sugar (glucose) after you have not eaten for a while (fasting). You may have this done every 1-3 years. Discuss your test results, treatment options, and if necessary, the need for more tests with your health care provider. Vaccines  Your health care provider may recommend certain vaccines, such as:  Influenza vaccine. This is recommended every year.  Tetanus, diphtheria, and acellular pertussis (Tdap, Td) vaccine. You may need a Td booster  every 10 years.  Zoster vaccine. You may need this after age 53.  Pneumococcal 13-valent conjugate (PCV13) vaccine. You may need this if you have certain conditions and have not been vaccinated.  Pneumococcal polysaccharide (PPSV23) vaccine. You may need one or two doses if  you smoke cigarettes or if you have certain conditions. Talk to your health care provider about which screenings and vaccines you need and how often you need them. This information is not intended to replace advice given to you by your health care provider. Make sure you discuss any questions you have with your health care provider. Document Released: 06/01/2015 Document Revised: 01/23/2016 Document Reviewed: 03/06/2015 Elsevier Interactive Patient Education  2017 [employer]  Fall Prevention in the Home Falls can cause injuries. They can happen to people of all ages. There are many things you can do to make your home safe and to help prevent falls. What can I do on the outside of my home?  Regularly fix the edges of walkways and driveways and fix any cracks.  Remove anything that might make you trip as you walk through a door, such as a raised step or threshold.  Trim any bushes or trees on the path to your home.  Use bright outdoor lighting.  Clear any walking paths of anything that might make someone trip, such as rocks or tools.  Regularly check to see if handrails are loose or broken. Make sure that both sides of any steps have handrails.  Any raised decks and porches should have guardrails on the edges.  Have any leaves, snow, or ice cleared regularly.  Use sand or salt on walking paths during winter.  Clean up any spills in your garage right away. This includes oil or grease spills. What can I do in the bathroom?  Use night lights.  Install grab bars by the toilet and in the tub and shower. Do not use towel bars as grab bars.  Use non-skid mats or decals in the tub or shower.  If you need to sit down in the shower, use a plastic, non-slip stool.  Keep the floor dry. Clean up any water that spills on the floor as soon as it happens.  Remove soap buildup in the tub or shower regularly.  Attach bath mats securely with double-sided non-slip rug tape.  Do not have  throw rugs and other things on the floor that can make you trip. What can I do in the bedroom?  Use night lights.  Make sure that you have a light by your bed that is easy to reach.  Do not use any sheets or blankets that are too big for your bed. They should not hang down onto the floor.  Have a firm chair that has side arms. You can use this for support while you get dressed.  Do not have throw rugs and other things on the floor that can make you trip. What can I do in the kitchen?  Clean up any spills right away.  Avoid walking on wet floors.  Keep items that you use a lot in easy-to-reach places.  If you need to reach something above you, use a strong step stool that has a grab bar.  Keep electrical cords out of the way.  Do not use floor polish or wax that makes floors slippery. If you must use wax, use non-skid floor wax.  Do not have throw rugs and other things on the floor that can make you trip. What  can I do with my stairs?  Do not leave any items on the stairs.  Make sure that there are handrails on both sides of the stairs and use them. Fix handrails that are broken or loose. Make sure that handrails are as long as the stairways.  Check any carpeting to make sure that it is firmly attached to the stairs. Fix any carpet that is loose or worn.  Avoid having throw rugs at the top or bottom of the stairs. If you do have throw rugs, attach them to the floor with carpet tape.  Make sure that you have a light switch at the top of the stairs and the bottom of the stairs. If you do not have them, ask someone to add them for you. What else can I do to help prevent falls?  Wear shoes that:  Do not have high heels.  Have rubber bottoms.  Are comfortable and fit you well.  Are closed at the toe. Do not wear sandals.  If you use a stepladder:  Make sure that it is fully opened. Do not climb a closed stepladder.  Make sure that both sides of the stepladder are  locked into place.  Ask someone to hold it for you, if possible.  Clearly mark and make sure that you can see:  Any grab bars or handrails.  First and last steps.  Where the edge of each step is.  Use tools that help you move around (mobility aids) if they are needed. These include:  Canes.  Walkers.  Scooters.  Crutches.  Turn on the lights when you go into a dark area. Replace any light bulbs as soon as they burn out.  Set up your furniture so you have a clear path. Avoid moving your furniture around.  If any of your floors are uneven, fix them.  If there are any pets around you, be aware of where they are.  Review your medicines with your doctor. Some medicines can make you feel dizzy. This can increase your chance of falling. Ask your doctor what other things that you can do to help prevent falls. This information is not intended to replace advice given to you by your health care provider. Make sure you discuss any questions you have with your health care provider. Document Released: 03/01/2009 Document Revised: 10/11/2015 Document Reviewed: 06/09/2014 Elsevier Interactive Patient Education  2017 [employer].

## 2017-03-24 NOTE — Progress Notes (Signed)
[name] Snow  MRN: [id number] DOB: 1965/12/15  PCP: Morrell RiddleWeber, Harris Penton L, PA-C  Chief Complaint  Patient presents with  . Foot Injury    follow up     Subjective:  Pt presents to clinic for recheck of his foot.  He has been trying to keep it dry - he has not yet gotten the honey dressing yet on the wound but he did order them.  He is taking his medication daily and he is trying to eat better.  History is obtained by patient.  Review of Systems  Patient Active Problem List   Diagnosis Date Noted  . Open wound of right foot 01/28/2017  . History of osteomyelitis 08/23/2016  . SIRS (systemic inflammatory response syndrome) (HCC) 08/22/2016  . HTN (hypertension) 07/22/2016  . Chronic pain syndrome 01/17/2016  . Pain in joint, shoulder region 06/29/2015  . Hyperlipidemia 10/17/2014  . Arteriosclerosis 06/17/2014  . Lung nodule-CT 3/15 06/17/2014  . Hepatomegaly-steatosis 08/08/2013  . DM neuropathy, painful (HCC) 10/19/2011  . Hip pain, chronic 10/19/2011  . Onychomycosis 10/19/2011  . Nephrolithiasis 10/19/2011  . Carpal tunnel syndrome 10/19/2011  . Obesity, Class III, BMI 40-49.9 (morbid obesity) (HCC) 04/30/2011  . DM type 2, uncontrolled, with neuropathy (HCC) 04/30/2011  . Gout 04/30/2011  . 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 clotrimazole-betamethasone (LOTRISONE) cream Apply 1 application topically 2 (two) times daily. 30 g 1  . Dulaglutide (TRULICITY) 1.5 MG/0.5ML SOPN Inject 1.5 Units into the skin once a week. 12 pen 0  . gabapentin (NEURONTIN) 400 MG capsule 1 po qam, 2 po q afternon and 2 po qhs 540 capsule 0  . glucose blood test strip Use as instructed 100 each 12  . insulin aspart (NOVOLOG) 100 UNIT/ML injection Inject 0-12 Units into the skin 3 (three) times daily with meals. Glucose 150-200 use 4 units, 201-250 use 6 units, 251-300 use 8 units, 301 and  350 use 10 units, 351 or greater use 12 units. 10 mL 0  . Insulin Glargine (LANTUS SOLOSTAR) 100 UNIT/ML Solostar Pen Inject 36 Units into the skin 2 (two) times daily. 10 pen 2  . Lancets MISC 1 Device by Does not apply route 3 (three) times daily. E11.40 100 each 12  . lisinopril (PRINIVIL,ZESTRIL) 10 MG tablet Take 0.5 tablets (5 mg total) by mouth daily. (Patient taking differently: Take 5 mg by mouth every morning. ) 90 tablet 0  . rosuvastatin (CRESTOR) 20 MG tablet Take 1 tablet (20 mg total) by mouth every evening. 90 tablet 1  . Wound Dressings (MEDIHONEY CA ALGINATE 2"X2") PADS Apply 1 each topically every 3 (three) days. 10 each 0   No current facility-administered medications on file prior to visit.     Allergies  Allergen Reactions  . No Known Allergies     Past Medical History:  Diagnosis Date  . Arthritis    gout  . Cataract   . CHF (congestive heart failure) (HCC)    patient denies  . Chronic kidney disease   . Clotting disorder [facility](HCC)    patient denies  . Diabetes mellitus   . Gout   . History of kidney stones    stents placed prior to and removed  . Hypertension    Social History   Social History Narrative  . Not on file   Social History   Tobacco Use  . Smoking status: Never Smoker  .  Smokeless tobacco: Never Used  Substance Use Topics  . Alcohol use: No  . Drug use: No   family history includes COPD in his father; Hyperlipidemia in his mother; Hypertension in his mother; Mental illness in his brother, father, and mother.     Objective:  BP 104/72   Pulse 61   Temp 97.8 F (36.6 C) (Oral)   Resp 18   Ht 5\' 11"  (1.803 m)   Wt 248 lb 6.4 oz (112.7 kg)   SpO2 97%   BMI 34.64 kg/m  Body mass index is 34.64 kg/m.  Physical Exam  Constitutional: He is oriented to person, place, and time and well-developed, well-nourished, and in no distress.  HENT:  Head: Normocephalic and atraumatic.  Right Ear: External ear normal.  Left Ear: External  ear normal.  Eyes: Conjunctivae are normal.  Neck: Normal range of motion.  Cardiovascular: Normal rate, regular rhythm and normal heart sounds.  No murmur heard. Pulmonary/Chest: Effort normal and breath sounds normal. He has no wheezes.  Neurological: He is alert and oriented to person, place, and time. Gait normal.  Skin: Skin is warm and dry.  Nails on right foot clipped  Psychiatric: Mood, memory, affect and judgment normal.        Assessment and Plan :  DM type 2, uncontrolled, with neuropathy (HCC)  Chronic pain syndrome - Plan: HYDROcodone-acetaminophen (NORCO) 10-325 MG tablet  Open wound of right foot, sequela  Diabetic polyneuropathy associated with type 2 diabetes mellitus (HCC)  Foot is not worse this week - he will use the honey dressing - if not improved consider sending to Dr Lajoyce Cornersuda at [facility] to see if he can do some wound care - the callus seems thicker and I suspect that if it is debrided the area might heal but I am not willing to do that in the office - he will continue to work to keep it clean and dry.  Benny LennertSarah Jahmere Bramel PA-C  [facility] 04/01/2017 1:50 PM

## 2017-03-31 ENCOUNTER — Encounter: Payer: Self-pay | Admitting: Physician Assistant

## 2017-04-01 ENCOUNTER — Encounter: Payer: Self-pay | Admitting: Physician Assistant

## 2017-04-02 ENCOUNTER — Telehealth: Payer: Self-pay | Admitting: Physician Assistant

## 2017-04-02 NOTE — Telephone Encounter (Signed)
Sent MyChart to message about making an appointment with another provider since [name] is going to be out through the end of the year.

## 2017-04-07 ENCOUNTER — Ambulatory Visit: Payer: PPO | Admitting: Physician Assistant

## 2017-04-24 ENCOUNTER — Other Ambulatory Visit: Payer: Self-pay

## 2017-04-24 DIAGNOSIS — IMO0002 Reserved for concepts with insufficient information to code with codable children: Secondary | ICD-10-CM

## 2017-04-24 DIAGNOSIS — E1142 Type 2 diabetes mellitus with diabetic polyneuropathy: Secondary | ICD-10-CM

## 2017-04-24 DIAGNOSIS — E1140 Type 2 diabetes mellitus with diabetic neuropathy, unspecified: Secondary | ICD-10-CM

## 2017-04-24 DIAGNOSIS — E1165 Type 2 diabetes mellitus with hyperglycemia: Secondary | ICD-10-CM

## 2017-04-24 MED ORDER — GABAPENTIN 400 MG PO CAPS: ORAL_CAPSULE | ORAL | 0 refills | Status: DC

## 2017-04-24 MED ORDER — INSULIN GLARGINE 100 UNIT/ML SOLOSTAR PEN: 36.0000 [IU] | PEN_INJECTOR | Freq: Two times a day (BID) | SUBCUTANEOUS | 2 refills | Status: DC

## 2017-04-27 ENCOUNTER — Ambulatory Visit: Payer: PPO | Admitting: Family Medicine

## 2017-05-01 ENCOUNTER — Ambulatory Visit: Payer: PPO | Admitting: Family Medicine

## 2017-05-01 ENCOUNTER — Other Ambulatory Visit: Payer: Self-pay

## 2017-05-01 VITALS — BP 132/68 | HR 63 | Temp 98.1°F | Resp 18 | Ht 71.0 in | Wt 257.2 lb

## 2017-05-01 DIAGNOSIS — L989 Disorder of the skin and subcutaneous tissue, unspecified: Secondary | ICD-10-CM | POA: Diagnosis not present

## 2017-05-01 DIAGNOSIS — S91301A Unspecified open wound, right foot, initial encounter: Secondary | ICD-10-CM

## 2017-05-01 DIAGNOSIS — G894 Chronic pain syndrome: Secondary | ICD-10-CM

## 2017-05-01 MED ORDER — HYDROCODONE-ACETAMINOPHEN 10-325 MG PO TABS: 1.0000 | ORAL_TABLET | ORAL | 0 refills | Status: DC | PRN

## 2017-05-01 NOTE — Progress Notes (Signed)
Subjective:  By signing my name below, I, Essence Howell, attest that this documentation has been prepared under the direction and in the presence of Shade FloodJeffrey R Briea Mcenery, MD Electronically Signed: Charline BillsEssence Howell, ED Scribe 05/01/2017 at 2:38 PM.   Patient ID: Karl Baleshomas G Ingman, male    DOB: 07-11-1965, 51 y.o.   MRN: [id number]   Chief Complaint  Patient presents with  . Foot Injury    follow up right foot   . Medication Refill    hydrocodone    HPI Karl Baleshomas G Dasaro is a 51 y.o. male who [facility] for R foot f/u. [name], PA-C  last saw him 11/6. H/o chronic pain syndrome, DM, peripheral neuropathy, Legg-calve-perthes, R osteomyelitis in 08/2016. Had been treated for open wound of R foot. Ortho: Dr. Roda ShuttersXu.   Last seen 11/6 by Maralyn SagoSarah for R foot wound recheck. Plan for a specific dressing medihoney calcium alginate that had been on order. See photo in chart from that visit. Appears to be ~1 cm open wound with some surrounding callus. Discussed potential eval by Dr. Lajoyce Cornersuda at [facility]. Multiple previous provider notes reviewed. Did have previous home health nurse with dressing changes. Had been released by surgeon and cancelled appointment with plastics on June note as he felt wound was improving.   Pt states that he used the dressing once daily for approximately 1 week. He has been cleaning the wound with soap and water bid and covering the area with a sock. Denies fever, discharge.   At the end of his visit he also showed a picture on his cell phone of a red spot on his L hip x 1 month. No change in size. No treatment.   Chronic Pain For chronic pain, he was prescribed #180 hydrocodone 10-325 on 11/6. Appears he has been treated for chronic hip pain and phantom pains on L foot from missing 4th and 5th digits. #180 ~once per month. Previous prescriptions 11/6, 10/16, 9/11, 8/7, 7/3.  Pt states that he has been taking chronic hydrocodone for chronic pain in several areas  including back and hip for several years. He typically takes 1 tab every 4 hours PRN, usually 4-6 tabs/day. States he is due to run out in a few days. Denies new side effects.  Patient Active Problem List   Diagnosis Date Noted  . Open wound of right foot 01/28/2017  . History of osteomyelitis 08/23/2016  . SIRS (systemic inflammatory response syndrome) (HCC) 08/22/2016  . HTN (hypertension) 07/22/2016  . Chronic pain syndrome 01/17/2016  . Pain in joint, shoulder region 06/29/2015  . Hyperlipidemia 10/17/2014  . Arteriosclerosis 06/17/2014  . Lung nodule-CT 3/15 06/17/2014  . Hepatomegaly-steatosis 08/08/2013  . DM neuropathy, painful (HCC) 10/19/2011  . Hip pain, chronic 10/19/2011  . Onychomycosis 10/19/2011  . Nephrolithiasis 10/19/2011  . Carpal tunnel syndrome 10/19/2011  . Obesity, Class III, BMI 40-49.9 (morbid obesity) (HCC) 04/30/2011  . DM type 2, uncontrolled, with neuropathy (HCC) 04/30/2011  . Gout 04/30/2011  . 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 Arthritis    gout  . Cataract   . CHF (congestive heart failure) (HCC)    patient denies  . Chronic kidney disease   . Clotting disorder [facility](HCC)    patient denies  . Diabetes mellitus   . Gout   . History of kidney stones    stents placed prior to and removed  . Hypertension  Past Surgical History:  Procedure Laterality Date  . AMPUTATION Right 08/24/2016   Procedure: IRRIGATION AND DEBRIDEMENT OF RIGHT FOOT AND AMPUTATION OF RIGHT FIFTH RAY;  Surgeon: Tarry Kos, MD;  Location: [facility];  Service: Orthopedics;  Laterality: Right;  . I&D EXTREMITY Right 08/27/2016   Procedure: IRRIGATION AND DEBRIDEMENT RIGHT FOOT, VAC,  4TH RAY AMPUTATION;  Surgeon: Tarry Kos, MD;  Location: [facility] OR;  Service: Orthopedics;  Laterality: Right;  . I&D EXTREMITY Right 08/29/2016   Procedure: IRRIGATION AND DEBRIDEMENT EXTREMITY WITH ACELL APPLICATION;  Surgeon: Tarry Kos, MD;   Location: [facility] OR;  Service: Orthopedics;  Laterality: Right;  . I&D EXTREMITY Right 09/04/2016   Procedure: IRRIGATION AND DEBRIDEMENT RIGHT FOOT, VAC SPONGE REPLACEMENT;  Surgeon: Tarry Kos, MD;  Location: [facility] OR;  Service: Orthopedics;  Laterality: Right;  . JOINT REPLACEMENT  020112   R Hip  . kidney stents     Allergies  Allergen Reactions  . No Known Allergies    Prior to Admission medications   Medication Sig Start Date End Date Taking? Authorizing Provider  clotrimazole-betamethasone (LOTRISONE) cream Apply 1 application topically 2 (two) times daily. 03/03/17  Yes Weber, Sarah L, PA-C  Dulaglutide (TRULICITY) 1.5 MG/0.5ML SOPN Inject 1.5 Units into the skin once a week. 09/30/16  Yes Weber, Dema Severin, PA-C  gabapentin (NEURONTIN) 400 MG capsule 1 po qam, 2 po q afternon and 2 po qhs 04/24/17  Yes Weber, Sarah L, PA-C  glucose blood test strip Use as instructed 11/04/16  Yes Weber, Dema Severin, PA-C  HYDROcodone-acetaminophen (NORCO) 10-325 MG tablet Take 1 tablet every 4 (four) hours as needed by mouth. 03/24/17  Yes Weber, Sarah L, PA-C  insulin aspart (NOVOLOG) 100 UNIT/ML injection Inject 0-12 Units into the skin 3 (three) times daily with meals. Glucose 150-200 use 4 units, 201-250 use 6 units, 251-300 use 8 units, 301 and 350 use 10 units, 351 or greater use 12 units. 08/31/16  Yes Arrien, York Ram, MD  Insulin Glargine (LANTUS SOLOSTAR) 100 UNIT/ML Solostar Pen Inject 36 Units into the skin 2 (two) times daily. 04/24/17  Yes Weber, Dema Severin, PA-C  Lancets MISC 1 Device by Does not apply route 3 (three) times daily. E11.40 11/04/16  Yes Weber, Sarah L, PA-C  lisinopril (PRINIVIL,ZESTRIL) 10 MG tablet Take 0.5 tablets (5 mg total) by mouth daily. Patient taking differently: Take 5 mg by mouth every morning.  04/25/16  Yes Weber, Sarah L, PA-C  rosuvastatin (CRESTOR) 20 MG tablet Take 1 tablet (20 mg total) by mouth every evening. 11/18/16  Yes Weber, Sarah L, PA-C  Wound Dressings  (MEDIHONEY CA ALGINATE 2"X2") PADS Apply 1 each topically every 3 (three) days. 03/17/17  Yes Weber, Dema Severin, PA-C   Social History   Socioeconomic History  . Marital status: Married    Spouse name: Not on file  . Number of children: Not on file  . Years of education: Not on file  . Highest education level: Not on file  Social Needs  . Financial resource strain: Not on file  . Food insecurity - worry: Not on file  . Food insecurity - inability: Not on file  . Transportation needs - medical: Not on file  . Transportation needs - non-medical: Not on file  Occupational History  . Not on file  Tobacco Use  . Smoking status: Never Smoker  . Smokeless tobacco: Never Used  Substance and Sexual Activity  . Alcohol use: No  . Drug  use: No  . Sexual activity: No    Birth control/protection: None  Other Topics Concern  . Not on file  Social History Narrative  . Not on file   Review of Systems  Constitutional: Negative for fever.  Skin: Positive for wound.      Objective:   Physical Exam  Constitutional: He is oriented to person, place, and time. He appears well-developed and well-nourished. No distress.  HENT:  Head: Normocephalic and atraumatic.  Eyes: Conjunctivae and EOM are normal.  Neck: Neck supple. No tracheal deviation present.  Cardiovascular: Normal rate.  Pulmonary/Chest: Effort normal. No respiratory distress.  Musculoskeletal: Normal range of motion.  Neurological: He is alert and oriented to person, place, and time.  Skin: Skin is warm and dry.  R foot: absence of 4th and 5th toes. Wound on lateral aspect with dry/callus skin surrounding central open wound. Indeterminate depth. Unable to visualize base. 1cm longitudinally x 5 mm vertically for opening. No associated redness or discharge. L hip: 3-4 mm erythematous papule without surrounding hyperpigmentation. No induration. No discharge.   Psychiatric: He has a normal mood and affect. His behavior is normal.    Nursing note and vitals reviewed.  Vitals:   05/01/17 1356  BP: 132/68  Pulse: 63  Resp: 18  Temp: 98.1 F (36.7 C)  TempSrc: Oral  SpO2: 97%  Weight: 257 lb 3.2 oz (116.7 kg)  Height: 5\' 11"  (1.803 m)      Assessment & Plan:   MYSON LEVI is a 51 y.o. male Wound of right foot - Plan: Ambulatory referral to Orthopedic Surgery  - persisitent open wound/ulcer. No surrounding infection seen at this time or bony ttp, unlikely osteo/deep infection.   - continue cleansing, keep clean/covered.   -refer to ortho, Dr. [name] for further eval and recommendations on wound care.   Chronic pain syndrome - Plan: HYDROcodone-acetaminophen (NORCO) 10-325 MG tablet, DISCONTINUED: HYDROcodone-acetaminophen (NORCO) 10-325 MG tablet  - stable by report, denies new side effects or overuse of meds and consistent refill times from prior. One month Rx given, then follow up with PCP for further refills.   Skin lesion  - noted at end of visit on left thigh. He reports this has been there for some time. May have irritation from abrasion vs nevus. Plans to follow up to eval this further, but recommended possible excision/bx if nonhealing.   Meds ordered this encounter  Medications  . DISCONTD: HYDROcodone-acetaminophen (NORCO) 10-325 MG tablet    Sig: Take 1 tablet by mouth every 4 (four) hours as needed.    Dispense:  180 tablet    Refill:  0  . HYDROcodone-acetaminophen (NORCO) 10-325 MG tablet    Sig: Take 1 tablet by mouth every 4 (four) hours as needed.    Dispense:  180 tablet    Refill:  0    Fill 05/03/17   Patient Instructions   I refilled your hydrocodone at same dose as prior. Please follow up with Sarah for further refills.   I would like you to meet with surgeon for your foot due to persistent open area. I will place a referral to Dr. [name] as mentioned by Maralyn Sago, but you can also call their office for appointment as you have been seen there before.   Please follow up to look at the  red spot on your hip further as we may need to biopsy that area if it has not healed in months. If there are other areas on skin of  concern, please bring those to our attention at that visit as well.   Return to the clinic or go to the nearest emergency room if any of your symptoms worsen or new symptoms occur.   IF you received an x-ray today, you will receive an invoice from [facility]. Please contact [facility] at [phone] with questions or concerns regarding your invoice.   IF you received labwork today, you will receive an invoice from [facility]. Please contact [facility] at [phone] with questions or concerns regarding your invoice.   Our billing staff will not be able to assist you with questions regarding bills from these companies.  You will be contacted with the lab results as soon as they are available. The fastest way to get your results is to activate your My Chart account. Instructions are located on the last page of this paperwork. If you have not heard from us regarding the results in 2 weeks, please contact this office.      I personally performed the services described in this documentation, which was scribed in my presence. The recorded information has been reviewed and considered for accuracy and completeness, addended by me as needed, and agree with information above.  Signed,   Meredith StaggersJeffrey Brycelyn Gambino, MD Primary Care at [facility].  05/02/17 10:04 PM

## 2017-05-01 NOTE — Patient Instructions (Addendum)
I refilled your hydrocodone at same dose as prior. Please follow up with Sarah for further refills.   I would like you to meet with surgeon for your foot due to persistent open area. I will place a referral to Dr. Lajoyce Cornersuda as mentioned by Maralyn SagoSarah, but you can also call their office for appointment as you have been seen there before.   Please follow up to look at the red spot on your hip further as we may need to biopsy that area if it has not healed in months. If there are other areas on skin of concern, please bring those to our attention at that visit as well.   Return to the clinic or go to the nearest emergency room if any of your symptoms worsen or new symptoms occur.   IF you received an x-ray today, you will receive an invoice from [facility]. Please contact [facility] at [phone] with questions or concerns regarding your invoice.   IF you received labwork today, you will receive an invoice from [facility]. Please contact [facility] at [phone] with questions or concerns regarding your invoice.   Our billing staff will not be able to assist you with questions regarding bills from these companies.  You will be contacted with the lab results as soon as they are available. The fastest way to get your results is to activate your My Chart account. Instructions are located on the last page of this paperwork. If you have not heard from us regarding the results in 2 weeks, please contact this office.

## 2017-05-02 ENCOUNTER — Encounter: Payer: Self-pay | Admitting: Family Medicine

## 2017-05-04 ENCOUNTER — Telehealth: Payer: Self-pay | Admitting: Physician Assistant

## 2017-05-04 DIAGNOSIS — Z794 Long term (current) use of insulin: Secondary | ICD-10-CM | POA: Diagnosis not present

## 2017-05-04 DIAGNOSIS — R109 Unspecified abdominal pain: Secondary | ICD-10-CM | POA: Diagnosis not present

## 2017-05-04 DIAGNOSIS — E119 Type 2 diabetes mellitus without complications: Secondary | ICD-10-CM | POA: Diagnosis not present

## 2017-05-04 DIAGNOSIS — G8929 Other chronic pain: Secondary | ICD-10-CM | POA: Diagnosis not present

## 2017-05-04 DIAGNOSIS — I10 Essential (primary) hypertension: Secondary | ICD-10-CM | POA: Diagnosis not present

## 2017-05-04 DIAGNOSIS — M545 Low back pain: Secondary | ICD-10-CM | POA: Diagnosis not present

## 2017-05-04 DIAGNOSIS — M5127 Other intervertebral disc displacement, lumbosacral region: Secondary | ICD-10-CM | POA: Diagnosis not present

## 2017-05-04 DIAGNOSIS — N200 Calculus of kidney: Secondary | ICD-10-CM | POA: Diagnosis not present

## 2017-05-04 DIAGNOSIS — Z79899 Other long term (current) drug therapy: Secondary | ICD-10-CM | POA: Diagnosis not present

## 2017-05-04 DIAGNOSIS — M5126 Other intervertebral disc displacement, lumbar region: Secondary | ICD-10-CM | POA: Diagnosis not present

## 2017-05-04 NOTE — Telephone Encounter (Signed)
Toni AmendCourtney from [facility] called regarding pt's Norco. Pharmacist is asking if she can fill today or does she need to wait until the 30th day or this Wednesday. Previous prescriber filled every 30 days. She can be reached at [phone]

## 2017-05-04 NOTE — Telephone Encounter (Signed)
Spoke with courtney ok to fill 1 day early .    Ok'd with Dr. Neva SeatGreene

## 2017-05-15 ENCOUNTER — Ambulatory Visit (INDEPENDENT_AMBULATORY_CARE_PROVIDER_SITE_OTHER): Payer: PPO | Admitting: Orthopaedic Surgery

## 2017-05-15 DIAGNOSIS — M86171 Other acute osteomyelitis, right ankle and foot: Secondary | ICD-10-CM

## 2017-05-15 NOTE — Progress Notes (Signed)
Office Visit Note   Patient: [name] Snow           Date of Birth: 12/27/1965           MRN: [id number] Visit Date: 05/15/2017              Requested by: Morrell RiddleWeber, Sarah L, PA-C [address] [place], [place] [address] PCP: Morrell RiddleWeber, Sarah L, PA-C   Assessment & Plan: Visit Diagnoses:  1. Acute osteomyelitis of metatarsal bone, right (HCC)     Plan: Overall appearance is very reassuring that this is normal postoperative changes.  Patient is in agreement.  Follow-up with me as needed.  Follow-Up Instructions: Return if symptoms worsen or fail to improve.   Orders:  No orders of the defined types were placed in this encounter.  No orders of the defined types were placed in this encounter.     Procedures: No procedures performed   Clinical Data: No additional findings.   Subjective: Chief Complaint  Patient presents with  . Right Foot - Pain    Patient comes in today for checkup of his right foot wound.  He underwent partial foot amputation with me about 8 months ago.  Plastic surgery has been working on his foot and he has had a concern that it may not be healing.  Denies any constitutional symptoms.    Review of Systems   Objective: Vital Signs: There were no vitals taken for this visit.  Physical Exam  Ortho Exam Right foot exam shows a fully healed surgical scar with an area of a callus.  There is no evidence of infection or erythema or cellulitis or fluctuance. Specialty Comments:  No specialty comments available.  Imaging: No results found.   PMFS History: Patient Active Problem List   Diagnosis Date Noted  . Open wound of right foot 01/28/2017  . History of osteomyelitis 08/23/2016  . SIRS (systemic inflammatory response syndrome) (HCC) 08/22/2016  . HTN (hypertension) 07/22/2016  . Chronic pain syndrome 01/17/2016  . Pain in joint, shoulder region 06/29/2015  . Hyperlipidemia 10/17/2014  . Arteriosclerosis 06/17/2014  . Lung nodule-CT 3/15  06/17/2014  . Hepatomegaly-steatosis 08/08/2013  . DM neuropathy, painful (HCC) 10/19/2011  . Hip pain, chronic 10/19/2011  . Onychomycosis 10/19/2011  . Nephrolithiasis 10/19/2011  . Carpal tunnel syndrome 10/19/2011  . Obesity, Class III, BMI 40-49.9 (morbid obesity) (HCC) 04/30/2011  . DM type 2, uncontrolled, with neuropathy (HCC) 04/30/2011  . Gout 04/30/2011  . 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 Arthritis    gout  . Cataract   . CHF (congestive heart failure) (HCC)    patient denies  . Chronic kidney disease   . Clotting disorder [facility](HCC)    patient denies  . Diabetes mellitus   . Gout   . History of kidney stones    stents placed prior to and removed  . Hypertension     Family History  Problem Relation Age of Onset  . Mental illness Mother   . Hypertension Mother   . Hyperlipidemia Mother   . Mental illness Father   . COPD Father   . Mental illness Brother     Past Surgical History:  Procedure Laterality Date  . AMPUTATION Right 08/24/2016   Procedure: IRRIGATION AND DEBRIDEMENT OF RIGHT FOOT AND AMPUTATION OF RIGHT FIFTH RAY;  Surgeon: Tarry KosNaiping M Evania Lyne, MD;  Location: [facility];  Service: Orthopedics;  Laterality: Right;  . I&D EXTREMITY Right  08/27/2016   Procedure: IRRIGATION AND DEBRIDEMENT RIGHT FOOT, VAC,  4TH RAY AMPUTATION;  Surgeon: Tarry KosNaiping M Shemuel Harkleroad, MD;  Location: [facility];  Service: Orthopedics;  Laterality: Right;  . I&D EXTREMITY Right 08/29/2016   Procedure: IRRIGATION AND DEBRIDEMENT EXTREMITY WITH ACELL APPLICATION;  Surgeon: Tarry KosNaiping M Paulla Mcclaskey, MD;  Location: [facility];  Service: Orthopedics;  Laterality: Right;  . I&D EXTREMITY Right 09/04/2016   Procedure: IRRIGATION AND DEBRIDEMENT RIGHT FOOT, VAC SPONGE REPLACEMENT;  Surgeon: Tarry KosNaiping M Rusell Meneely, MD;  Location: [facility];  Service: Orthopedics;  Laterality: Right;  . JOINT REPLACEMENT  020112   R Hip  . kidney stents     Social History   Occupational History  . Not on  file  Tobacco Use  . Smoking status: Never Smoker  . Smokeless tobacco: Never Used  Substance and Sexual Activity  . Alcohol use: No  . Drug use: No  . Sexual activity: No    Birth control/protection: None

## 2017-05-26 ENCOUNTER — Ambulatory Visit (INDEPENDENT_AMBULATORY_CARE_PROVIDER_SITE_OTHER): Payer: PPO | Admitting: Orthopaedic Surgery

## 2017-05-26 ENCOUNTER — Encounter (INDEPENDENT_AMBULATORY_CARE_PROVIDER_SITE_OTHER): Payer: Self-pay | Admitting: Orthopaedic Surgery

## 2017-05-26 VITALS — BP 162/78 | HR 52 | Ht 72.0 in | Wt 250.0 lb

## 2017-05-26 DIAGNOSIS — IMO0002 Reserved for concepts with insufficient information to code with codable children: Secondary | ICD-10-CM

## 2017-05-26 DIAGNOSIS — M48061 Spinal stenosis, lumbar region without neurogenic claudication: Secondary | ICD-10-CM | POA: Diagnosis not present

## 2017-05-26 DIAGNOSIS — E1140 Type 2 diabetes mellitus with diabetic neuropathy, unspecified: Secondary | ICD-10-CM | POA: Diagnosis not present

## 2017-05-26 DIAGNOSIS — M109 Gout, unspecified: Secondary | ICD-10-CM | POA: Diagnosis not present

## 2017-05-26 DIAGNOSIS — E1165 Type 2 diabetes mellitus with hyperglycemia: Secondary | ICD-10-CM

## 2017-05-26 NOTE — Progress Notes (Signed)
Office Visit Note   Patient: [name] Snow           Date of Birth: 1966/01/27           MRN: [id number] Visit Date: 05/26/2017              Requested by: Morrell Riddle, PA-C [address] [place], [place] [address] PCP: Morrell Riddle, PA-C   Assessment & Plan: Visit Diagnoses:  1. Gout of multiple sites, unspecified cause, unspecified chronicity   2. DM type 2, uncontrolled, with neuropathy (HCC)   3. Spinal stenosis of lumbar region without neurogenic claudication     Plan: Primary care physician get his uric acid and allopurinol 100 mg or if he has abnormal kidney test, Uloric  at a reduced dose and then gradually increase.  Pain and discomfort he is having may be related to subclinical gout rather than from his neuropathy and diabetes.  Discussion about gout was had with patient.  In the past  When Allopurinol wasstarted he always had a flare and then discontinued it.  Once the patient has gotten his uric acid under control after several months he should be able to decrease his narcotic dose.  In absence of dictation symptoms currently neuropathy I would not recommend further imaging studies of his back at this time.  Follow-Up Instructions: No Follow-up on file.   Orders:  No orders of the defined types were placed in this encounter.  No orders of the defined types were placed in this encounter.     Procedures: No procedures performed   Clinical Data: No additional findings.   Subjective: Chief Complaint  Patient presents with  . Lower Back - Pain    HPI 52 year old male seen for evaluation of low back pain.  Patient had CT scan for evaluation of kidney stones and multiple disc protrusions with calcification and was noted narrowing the canal.  He has been on chronic pain medication and has had previous total hip arthroplasty, long-term gout had flares when allopurinol was attempted to be started.  States he was on crutches for 7/2 years due to Legg-Perthes  disease.  He has been on narcotics since the late 1990s.  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  Back pressure.  Has neuropathy from his diabetes which she states he started noticing after his total hip arthroplasty.  He states he can walk as far as he needs to and denies occasion symptoms.  Review of Systems complex history with gout uric acid noted in May 2018 at 8.2.  His hemoglobin A1c in October was 7.1.  Type 2 diabetes with neuropathy kidney stones, hyperlipidemia, obesity, total hip arthroplasty, history of diabetic foot ulcers with osteomyelitis, hypertension about a medical bleed with steatosis total of 60 mg of hydrocodone a day.  Otherwise negative as it pertains to HPI.   Objective: Vital Signs: BP (!) 162/78   Pulse (!) 52   Ht 6' (1.829 m)   Wt 250 lb (113.4 kg)   BMI 33.91 kg/m   Physical Exam  Constitutional: He is oriented to person, place, and time. He appears well-developed and well-nourished.  HENT:  Head: Normocephalic and atraumatic.  Eyes: EOM are normal. Pupils are equal, round, and reactive to light.  Neck: No tracheal deviation present. No thyromegaly present.  Cardiovascular: Normal rate.  Pulmonary/Chest: Effort normal. He has no wheezes.  Abdominal: Soft. Bowel sounds are normal.  Neurological: He is alert and  oriented to person, place, and time.  Skin: Skin is warm and dry. Capillary refill takes less than 2 seconds.  Psychiatric: He has a normal mood and affect. His behavior is normal. Judgment and thought content normal.    Ortho Exam olecranon bursa mass 3-4 cm firm palpable.  Normal heel toe gait.  Specialty Comments:  No specialty comments available.  Imaging: CT scan the patient [facility] showed calcified disc protrusions narrow canal at the level of the discs.  There is some facet degenerative changes noted as well.   PMFS History: Patient Active Problem List   Diagnosis Date Noted  .  Open wound of right foot 01/28/2017  . History of osteomyelitis 08/23/2016  . SIRS (systemic inflammatory response syndrome) (HCC) 08/22/2016  . HTN (hypertension) 07/22/2016  . Chronic pain syndrome 01/17/2016  . Pain in joint, shoulder region 06/29/2015  . Hyperlipidemia 10/17/2014  . Arteriosclerosis 06/17/2014  . Lung nodule-CT 3/15 06/17/2014  . Hepatomegaly-steatosis 08/08/2013  . DM neuropathy, painful (HCC) 10/19/2011  . Hip pain, chronic 10/19/2011  . Onychomycosis 10/19/2011  . Nephrolithiasis 10/19/2011  . Carpal tunnel syndrome 10/19/2011  . Obesity, Class III, BMI 40-49.9 (morbid obesity) (HCC) 04/30/2011  . DM type 2, uncontrolled, with neuropathy (HCC) 04/30/2011  . Gout 04/30/2011  . 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 Arthritis    gout  . Cataract   . CHF (congestive heart failure) (HCC)    patient denies  . Chronic kidney disease   . Clotting disorder [facility](HCC)    patient denies  . Diabetes mellitus   . Gout   . History of kidney stones    stents placed prior to and removed  . Hypertension     Family History  Problem Relation Age of Onset  . Mental illness Mother   . Hypertension Mother   . Hyperlipidemia Mother   . Mental illness Father   . COPD Father   . Mental illness Brother     Past Surgical History:  Procedure Laterality Date  . AMPUTATION Right 08/24/2016   Procedure: IRRIGATION AND DEBRIDEMENT OF RIGHT FOOT AND AMPUTATION OF RIGHT FIFTH RAY;  Surgeon: Tarry KosNaiping M Xu, MD;  Location: [facility];  Service: Orthopedics;  Laterality: Right;  . I&D EXTREMITY Right 08/27/2016   Procedure: IRRIGATION AND DEBRIDEMENT RIGHT FOOT, VAC,  4TH RAY AMPUTATION;  Surgeon: Tarry KosNaiping M Xu, MD;  Location: [facility];  Service: Orthopedics;  Laterality: Right;  . I&D EXTREMITY Right 08/29/2016   Procedure: IRRIGATION AND DEBRIDEMENT EXTREMITY WITH ACELL APPLICATION;  Surgeon: Tarry KosNaiping M Xu, MD;  Location: [facility];  Service:  Orthopedics;  Laterality: Right;  . I&D EXTREMITY Right 09/04/2016   Procedure: IRRIGATION AND DEBRIDEMENT RIGHT FOOT, VAC SPONGE REPLACEMENT;  Surgeon: Tarry KosNaiping M Xu, MD;  Location: [facility] OR;  Service: Orthopedics;  Laterality: Right;  . JOINT REPLACEMENT  [id number]   R Hip  . kidney stents     Social History   Occupational History  . Not on file  Tobacco Use  . Smoking status: Never Smoker  . Smokeless tobacco: Never Used  Substance and Sexual Activity  . Alcohol use: No  . Drug use: No  . Sexual activity: No    Birth control/protection: None

## 2017-06-01 ENCOUNTER — Other Ambulatory Visit: Payer: Self-pay

## 2017-06-01 ENCOUNTER — Encounter: Payer: Self-pay | Admitting: Family Medicine

## 2017-06-01 ENCOUNTER — Ambulatory Visit: Payer: PPO | Admitting: Family Medicine

## 2017-06-01 VITALS — BP 150/80 | HR 59 | Temp 98.0°F | Resp 16 | Ht 70.87 in | Wt 261.0 lb

## 2017-06-01 DIAGNOSIS — M109 Gout, unspecified: Secondary | ICD-10-CM | POA: Diagnosis not present

## 2017-06-01 DIAGNOSIS — M48061 Spinal stenosis, lumbar region without neurogenic claudication: Secondary | ICD-10-CM

## 2017-06-01 DIAGNOSIS — Z794 Long term (current) use of insulin: Secondary | ICD-10-CM

## 2017-06-01 DIAGNOSIS — G894 Chronic pain syndrome: Secondary | ICD-10-CM | POA: Diagnosis not present

## 2017-06-01 DIAGNOSIS — M549 Dorsalgia, unspecified: Secondary | ICD-10-CM | POA: Diagnosis not present

## 2017-06-01 DIAGNOSIS — G8929 Other chronic pain: Secondary | ICD-10-CM

## 2017-06-01 DIAGNOSIS — E1140 Type 2 diabetes mellitus with diabetic neuropathy, unspecified: Secondary | ICD-10-CM | POA: Diagnosis not present

## 2017-06-01 MED ORDER — PREDNISONE 20 MG PO TABS: 40.0000 mg | ORAL_TABLET | Freq: Every day | ORAL | 0 refills | Status: DC

## 2017-06-01 MED ORDER — HYDROCODONE-ACETAMINOPHEN 10-325 MG PO TABS: 1.0000 | ORAL_TABLET | ORAL | 0 refills | Status: DC | PRN

## 2017-06-01 MED ORDER — ALLOPURINOL 100 MG PO TABS: 100.0000 mg | ORAL_TABLET | Freq: Every day | ORAL | 3 refills | Status: DC

## 2017-06-01 NOTE — Progress Notes (Signed)
Subjective:  By signing my name below, I, Essence Howell, attest that this documentation has been prepared under the direction and in the presence of Shade Flood, MD Electronically Signed: Charline Bills, ED Scribe 06/01/2017 at 10:11 AM.   Patient ID: [name] Snow, male    DOB: 1966-01-12, 52 y.o.   MRN: [id number]  Chief Complaint  Patient presents with  . Pain    pt states he has been having severe back pain and would like something to help   . Medication Refill    Hydrocodone Rx   HPI [name] Snow is a 52 y.o. male who presents to Primary Care at [facility] complaining of back pain. H/o multiple med problems including chronic pain disorder. PCP: Morrell Riddle, PA-C. Has been treated for R foot wound with prior osteomyelitis. For chronic pain he is taking hydrocodone for back and hip for several yrs 1 tab every 4 hrs prn, usually 4-6 tabs per day when discussed 12/14. Given 1 month prescription on12/14 and planned for f/u with PCP but she is not available for appointment at this time.   Back Pain Was seen by Dr. Ophelia Charter 1/8, possible subclinical gout. Recommenced uric acid level and allopurinol vs uloric depending on kidney function with plan for decreased narcotic need as uric as improves.  No further imaging recommended of back at this time. H/o spinal stenosis without neurogenic claudication. Multiple disc protrusions on prior imaging. H/o neuropathy from DM. Last uric acid 09/2016 was 8.2. Renal function normal with creatinine 1.114 02/2017.  Pt states that low, central back pain has worsened over the past few days. Denies hematuria, difficulty urinating, incontinence, saddle anesthesia, LE weakness. Pt reports a h/o gout in several areas which he states was flared by allopurinol. He was given 1 wk of Prednisone ~1 month ago when he went to the ED with some improvement of back pain. Denies fever, fall/injury.  Foot Wound Referred to Dr. [name] at last visit due to continued open  wound. Was seen by Dr. Roda Shutters on 12/28. Appearance was reassuring with normal post-op changes. There was a healed surgical scar with an area of callus. No additional treatments recommended.  Pt states that his foot wound has continued to improve and has almost healed completely.   DM A1C was 7.1 on 02/2017. On lantus bid and trulicity.  Controlled substance registry reviewed: consistent with my last prescription and Jethro Poling.  Patient Active Problem List   Diagnosis Date Noted  . Open wound of right foot 01/28/2017  . History of osteomyelitis 08/23/2016  . SIRS (systemic inflammatory response syndrome) (HCC) 08/22/2016  . HTN (hypertension) 07/22/2016  . Chronic pain syndrome 01/17/2016  . Pain in joint, shoulder region 06/29/2015  . Hyperlipidemia 10/17/2014  . Arteriosclerosis 06/17/2014  . Lung nodule-CT 3/15 06/17/2014  . Hepatomegaly-steatosis 08/08/2013  . DM neuropathy, painful (HCC) 10/19/2011  . Hip pain, chronic 10/19/2011  . Onychomycosis 10/19/2011  . Nephrolithiasis 10/19/2011  . Carpal tunnel syndrome 10/19/2011  . Obesity, Class III, BMI 40-49.9 (morbid obesity) (HCC) 04/30/2011  . DM type 2, uncontrolled, with neuropathy (HCC) 04/30/2011  . Gout 04/30/2011  . 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 Arthritis    gout  . Cataract   . CHF (congestive heart failure) (HCC)    patient denies  . Chronic kidney disease   . Clotting disorder [facility])    patient denies  . Diabetes mellitus   . Gout   .  History of kidney stones    stents placed prior to and removed  . Hypertension    Past Surgical History:  Procedure Laterality Date  . AMPUTATION Right 08/24/2016   Procedure: IRRIGATION AND DEBRIDEMENT OF RIGHT FOOT AND AMPUTATION OF RIGHT FIFTH RAY;  Surgeon: Tarry Kos, MD;  Location: [facility] ORS;  Service: Orthopedics;  Laterality: Right;  . I&D EXTREMITY Right 08/27/2016   Procedure: IRRIGATION AND DEBRIDEMENT  RIGHT FOOT, VAC,  4TH RAY AMPUTATION;  Surgeon: Tarry Kos, MD;  Location: [facility] OR;  Service: Orthopedics;  Laterality: Right;  . I&D EXTREMITY Right 08/29/2016   Procedure: IRRIGATION AND DEBRIDEMENT EXTREMITY WITH ACELL APPLICATION;  Surgeon: Tarry Kos, MD;  Location: [facility] OR;  Service: Orthopedics;  Laterality: Right;  . I&D EXTREMITY Right 09/04/2016   Procedure: IRRIGATION AND DEBRIDEMENT RIGHT FOOT, VAC SPONGE REPLACEMENT;  Surgeon: Tarry Kos, MD;  Location: [facility] OR;  Service: Orthopedics;  Laterality: Right;  . JOINT REPLACEMENT  [id number]   R Hip  . kidney stents     Allergies  Allergen Reactions  . No Known Allergies    Prior to Admission medications   Medication Sig Start Date End Date Taking? Authorizing Provider  clotrimazole-betamethasone (LOTRISONE) cream Apply 1 application topically 2 (two) times daily. 03/03/17   Weber, Dema Severin, PA-C  Dulaglutide (TRULICITY) 1.5 MG/0.5ML SOPN Inject 1.5 Units into the skin once a week. 09/30/16   Valarie Cones, Dema Severin, PA-C  gabapentin (NEURONTIN) 400 MG capsule 1 po qam, 2 po q afternon and 2 po qhs 04/24/17   Weber, Sarah L, PA-C  glucose blood test strip Use as instructed 11/04/16   Weber, Dema Severin, PA-C  HYDROcodone-acetaminophen (NORCO) 10-325 MG tablet Take 1 tablet by mouth every 4 (four) hours as needed. 05/01/17   Shade Flood, MD  insulin aspart (NOVOLOG) 100 UNIT/ML injection Inject 0-12 Units into the skin 3 (three) times daily with meals. Glucose 150-200 use 4 units, 201-250 use 6 units, 251-300 use 8 units, 301 and 350 use 10 units, 351 or greater use 12 units. 08/31/16   Arrien, York Ram, MD  Insulin Glargine (LANTUS SOLOSTAR) 100 UNIT/ML Solostar Pen Inject 36 Units into the skin 2 (two) times daily. 04/24/17   Valarie Cones, Dema Severin, PA-C  Lancets MISC 1 Device by Does not apply route 3 (three) times daily. E11.40 11/04/16   Weber, Dema Severin, PA-C  lisinopril (PRINIVIL,ZESTRIL) 10 MG tablet Take 0.5 tablets (5 mg total) by mouth  daily. Patient taking differently: Take 5 mg by mouth every morning.  04/25/16   Weber, Dema Severin, PA-C  rosuvastatin (CRESTOR) 20 MG tablet Take 1 tablet (20 mg total) by mouth every evening. 11/18/16   Weber, Dema Severin, PA-C  Wound Dressings (MEDIHONEY CA ALGINATE 2"X2") PADS Apply 1 each topically every 3 (three) days. 03/17/17   Morrell Riddle, PA-C   Social History   Socioeconomic History  . Marital status: Married    Spouse name: Not on file  . Number of children: Not on file  . Years of education: Not on file  . Highest education level: Not on file  Social Needs  . Financial resource strain: Not on file  . Food insecurity - worry: Not on file  . Food insecurity - inability: Not on file  . Transportation needs - medical: Not on file  . Transportation needs - non-medical: Not on file  Occupational History  . Not on file  Tobacco Use  . Smoking status: Never Smoker  .  Smokeless tobacco: Never Used  Substance and Sexual Activity  . Alcohol use: No  . Drug use: No  . Sexual activity: No    Birth control/protection: None  Other Topics Concern  . Not on file  Social History Narrative  . Not on file   Review of Systems  Constitutional: Negative for fever.  Genitourinary: Negative for difficulty urinating, enuresis and hematuria.  Musculoskeletal: Positive for back pain.  Skin: Positive for wound (improving).  Neurological: Negative for weakness and numbness.      Objective:   Physical Exam  Constitutional: He is oriented to person, place, and time. He appears well-developed and well-nourished. No distress.  HENT:  Head: Normocephalic and atraumatic.  Eyes: Conjunctivae and EOM are normal.  Neck: Neck supple. No tracheal deviation present.  Cardiovascular: Normal rate.  Pulmonary/Chest: Effort normal. No respiratory distress.  Abdominal: There is no CVA tenderness.  Musculoskeletal: Normal range of motion.  Able to heel and toe walk without difficulty. Lumbar flexion to  ~80 degrees. Pain with extension, slight limited lateral flexion bilaterally. Intact rotation. Neg seated straight leg raise.  Neurological: He is alert and oriented to person, place, and time.  Reflex Scores:      Patellar reflexes are 2+ on the right side and 2+ on the left side.      Achilles reflexes are 2+ on the right side and 2+ on the left side. Skin: Skin is warm and dry.  Psychiatric: He has a normal mood and affect. His behavior is normal.  Nursing note and vitals reviewed.  Vitals:   06/01/17 0942  BP: (!) 150/80  Pulse: (!) 59  Resp: 16  Temp: 98 F (36.7 C)  TempSrc: Oral  SpO2: 96%  Weight: 261 lb (118.4 kg)  Height: 5' 10.87" (1.8 m)      Assessment & Plan:    [name] Snow is a 52 y.o. male Spinal stenosis, lumbar region, without neurogenic claudication - Plan: predniSONE (DELTASONE) 20 MG tablet Chronic pain syndrome - Plan: HYDROcodone-acetaminophen (NORCO) 10-325 MG tablet Gout of multiple sites, unspecified cause, unspecified chronicity - Plan: Uric Acid, Basic metabolic panel, predniSONE (DELTASONE) 20 MG tablet, allopurinol (ZYLOPRIM) 100 MG tablet Chronic midline back pain, unspecified back location - Plan: predniSONE (DELTASONE) 20 MG tablet  - Chronic back pain, recent evaluation with orthopedics noted possible gout component. Worsening of pain recently. May be flair of spinal stenosis/muscular back pain versus gout.  -Prednisone 40 mg daily 5 days, potential side effects/risks discussed. Continue hydrocodone Q4 hours when necessary. Additional one-month prescription given with follow-up with PCP for further refills  -Check uric acid, then consider starting allopurinol in the next 2 weeks as symptoms improved for gout treatment  -Follow-up with PCP within the next 1 month, sooner if worsening symptoms - RTC precautions given  Type 2 diabetes mellitus with diabetic neuropathy, with long-term current use of insulin (HCC) - Plan: Basic metabolic  panel  -Possible neuropathy component of pain, but as above gout contributor per orthopedics.  -Discussed potential increase in glucose with prednisone usage, monitor closely and adjust Lantus dose as needed temporarily.  -Plan for recheck A1c next month   Meds ordered this encounter  Medications  . HYDROcodone-acetaminophen (NORCO) 10-325 MG tablet    Sig: Take 1 tablet by mouth every 4 (four) hours as needed.    Dispense:  180 tablet    Refill:  0  . predniSONE (DELTASONE) 20 MG tablet    Sig: Take 2 tablets (40 mg total)  by mouth daily with breakfast.    Dispense:  10 tablet    Refill:  0  . allopurinol (ZYLOPRIM) 100 MG tablet    Sig: Take 1 tablet (100 mg total) by mouth daily.    Dispense:  30 tablet    Refill:  3   Patient Instructions    It may be worth trying allopurinol at a low dose as gout may be a contributor to your back pain. I will check uric acid and kidney function today. Can try prednisone for about 5 days, then start allopurinol after current symptoms improve. If any flair of gout symptoms with restarting the allopurinol, follow up with Maralyn Sago to decide on next step including possible rheumatology evaluation.   Monitor your blood sugar closely on the prednisone as you may need to temporarily increase it by a few units.    Hydrocodone refilled at same dose for now, but next refill will need to be provided by [name].   Return to the clinic or go to the nearest emergency room if any of your symptoms worsen or new symptoms occur.   Back Pain, Adult Many adults have back pain from time to time. Common causes of back pain include:  A strained muscle or ligament.  Wear and tear (degeneration) of the spinal disks.  Arthritis.  A hit to the back.  Back pain can be short-lived (acute) or last a long time (chronic). A physical exam, lab tests, and imaging studies may be done to find the cause of your pain. Follow these instructions at home: Managing pain and  stiffness  Take over-the-counter and prescription medicines only as told by your health care provider.  If directed, apply heat to the affected area as often as told by your health care provider. Use the heat source that your health care provider recommends, such as a moist heat pack or a heating pad. ? Place a towel between your skin and the heat source. ? Leave the heat on for 20-30 minutes. ? Remove the heat if your skin turns bright red. This is especially important if you are unable to feel pain, heat, or cold. You have a greater risk of getting burned.  If directed, apply ice to the injured area: ? Put ice in a plastic bag. ? Place a towel between your skin and the bag. ? Leave the ice on for 20 minutes, 2-3 times a day for the first 2-3 days. Activity  Do not stay in bed. Resting more than 1-2 days can delay your recovery.  Take short walks on even surfaces as soon as you are able. Try to increase the length of time you walk each day.  Do not sit, drive, or stand in one place for more than 30 minutes at a time. Sitting or standing for long periods of time can put stress on your back.  Use proper lifting techniques. When you bend and lift, use positions that put less stress on your back: ? [place] your knees. ? Keep the load close to your body. ? Avoid twisting.  Exercise regularly as told by your health care provider. Exercising will help your back heal faster. This also helps prevent back injuries by keeping muscles strong and flexible.  Your health care provider may recommend that you see a physical therapist. This person can help you come up with a safe exercise program. Do any exercises as told by your physical therapist. Lifestyle  Maintain a healthy weight. Extra weight puts stress on  your back and makes it difficult to have good posture.  Avoid activities or situations that make you feel anxious or stressed. Learn ways to manage anxiety and stress. One way to manage stress  is through exercise. Stress and anxiety increase muscle tension and can make back pain worse. General instructions  Sleep on a firm mattress in a comfortable position. Try lying on your side with your knees slightly bent. If you lie on your back, put a pillow under your knees.  Follow your treatment plan as told by your health care provider. This may include: ? Cognitive or behavioral therapy. ? Acupuncture or massage therapy. ? Meditation or yoga. Contact a health care provider if:  You have pain that is not relieved with rest or medicine.  You have increasing pain going down into your legs or buttocks.  Your pain does not improve in 2 weeks.  You have pain at night.  You lose weight.  You have a fever or chills. Get help right away if:  You develop new bowel or bladder control problems.  You have unusual weakness or numbness in your arms or legs.  You develop nausea or vomiting.  You develop abdominal pain.  You feel faint. Summary  Many adults have back pain from time to time. A physical exam, lab tests, and imaging studies may be done to find the cause of your pain.  Use proper lifting techniques. When you bend and lift, use positions that put less stress on your back.  Take over-the-counter and prescription medicines and apply heat or ice as directed by your health care provider. This information is not intended to replace advice given to you by your health care provider. Make sure you discuss any questions you have with your health care provider. Document Released: 05/05/2005 Document Revised: 06/09/2016 Document Reviewed: 06/09/2016 Elsevier Interactive Patient Education  2018 [employer]  Gout Gout is painful swelling that can occur in some of your joints. Gout is a type of arthritis. This condition is caused by having too much uric acid in your body. Uric acid is a chemical that forms when your body breaks down substances called purines. Purines are important  for building body proteins. When your body has too much uric acid, sharp crystals can form and build up inside your joints. This causes pain and swelling. Gout attacks can happen quickly and be very painful (acute gout). Over time, the attacks can affect more joints and become more frequent (chronic gout). Gout can also cause uric acid to build up under your skin and inside your kidneys. What are the causes? This condition is caused by too much uric acid in your blood. This can occur because:  Your kidneys do not remove enough uric acid from your blood. This is the most common cause.  Your body makes too much uric acid. This can occur with some cancers and cancer treatments. It can also occur if your body is breaking down too many red blood cells (hemolytic anemia).  You eat too many foods that are high in purines. These foods include organ meats and some seafood. Alcohol, especially beer, is also high in purines.  A gout attack may be triggered by trauma or stress. What increases the risk? This condition is more likely to develop in people who:  Have a family history of gout.  Are male and middle-aged.  Are male and have gone through menopause.  Are obese.  Frequently drink alcohol, especially beer.  Are dehydrated.  Lose  weight too quickly.  Have an organ transplant.  Have lead poisoning.  Take certain medicines, including aspirin, cyclosporine, diuretics, levodopa, and niacin.  Have kidney disease or psoriasis.  What are the signs or symptoms? An attack of acute gout happens quickly. It usually occurs in just one joint. The most common place is the big toe. Attacks often start at night. Other joints that may be affected include joints of the feet, ankle, knee, fingers, wrist, or elbow. Symptoms may include:  Severe pain.  Warmth.  Swelling.  Stiffness.  Tenderness. The affected joint may be very painful to touch.  Shiny, red, or purple skin.  Chills and  fever.  Chronic gout may cause symptoms more frequently. More joints may be involved. You may also have white or yellow lumps (tophi) on your hands or feet or in other areas near your joints. How is this diagnosed? This condition is diagnosed based on your symptoms, medical history, and physical exam. You may have tests, such as:  Blood tests to measure uric acid levels.  Removal of joint fluid with a needle (aspiration) to look for uric acid crystals.  X-rays to look for joint damage.  How is this treated? Treatment for this condition has two phases: treating an acute attack and preventing future attacks. Acute gout treatment may include medicines to reduce pain and swelling, including:  NSAIDs.  Steroids. These are strong anti-inflammatory medicines that can be taken by mouth (orally) or injected into a joint.  Colchicine. This medicine relieves pain and swelling when it is taken soon after an attack. It can be given orally or through an IV tube.  Preventive treatment may include:  Daily use of smaller doses of NSAIDs or colchicine.  Use of a medicine that reduces uric acid levels in your blood.  Changes to your diet. You may need to see a specialist about healthy eating (dietitian).  Follow these instructions at home: During a Gout Attack  If directed, apply ice to the affected area: ? Put ice in a plastic bag. ? Place a towel between your skin and the bag. ? Leave the ice on for 20 minutes, 2-3 times a day.  Rest the joint as much as possible. If the affected joint is in your leg, you may be given crutches to use.  Raise (elevate) the affected joint above the level of your heart as often as possible.  Drink enough fluids to keep your urine clear or pale yellow.  Take over-the-counter and prescription medicines only as told by your health care provider.  Do not drive or operate heavy machinery while taking prescription pain medicine.  Follow instructions from your  health care provider about eating or drinking restrictions.  Return to your normal activities as told by your health care provider. Ask your health care provider what activities are safe for you. Avoiding Future Gout Attacks  Follow a low-purine diet as told by your dietitian or health care provider. Avoid foods and drinks that are high in purines, including liver, kidney, anchovies, asparagus, herring, mushrooms, mussels, and beer.  Limit alcohol intake to no more than 1 drink a day for nonpregnant women and 2 drinks a day for men. One drink equals 12 oz of beer, 5 oz of wine, or 1 oz of hard liquor.  Maintain a healthy weight or lose weight if you are overweight. If you want to lose weight, talk with your health care provider. It is important that you do not lose weight too quickly.  Start or maintain an exercise program as told by your health care provider.  Drink enough fluids to keep your urine clear or pale yellow.  Take over-the-counter and prescription medicines only as told by your health care provider.  Keep all follow-up visits as told by your health care provider. This is important. Contact a health care provider if:  You have another gout attack.  You continue to have symptoms of a gout attack after10 days of treatment.  You have side effects from your medicines.  You have chills or a fever.  You have burning pain when you urinate.  You have pain in your lower back or belly. Get help right away if:  You have severe or uncontrolled pain.  You cannot urinate. This information is not intended to replace advice given to you by your health care provider. Make sure you discuss any questions you have with your health care provider. Document Released: 05/02/2000 Document Revised: 10/11/2015 Document Reviewed: 02/15/2015 Elsevier Interactive Patient Education  2018 [employer].   IF you received an x-ray today, you will receive an invoice from [facility].  Please contact [facility] at [phone] with questions or concerns regarding your invoice.   IF you received labwork today, you will receive an invoice from [place]. Please contact [facility] at [phone] with questions or concerns regarding your invoice.   Our billing staff will not be able to assist you with questions regarding bills from these companies.  You will be contacted with the lab results as soon as they are available. The fastest way to get your results is to activate your My Chart account. Instructions are located on the last page of this paperwork. If you have not heard from [place] regarding the results in 2 weeks, please contact this office.       I personally performed the services described in this documentation, which was scribed in my presence. The recorded information has been reviewed and considered for accuracy and completeness, addended by me as needed, and agree with information above.  Signed,   Meredith Staggers, MD Primary Care at [facility].  06/01/17 10:31 AM

## 2017-06-01 NOTE — Patient Instructions (Addendum)
It may be worth trying allopurinol at a low dose as gout may be a contributor to your back pain. I will check uric acid and kidney function today. Can try prednisone for about 5 days, then start allopurinol after current symptoms improve. If any flair of gout symptoms with restarting the allopurinol, follow up with Maralyn Sago to decide on next step including possible rheumatology evaluation.   Monitor your blood sugar closely on the prednisone as you may need to temporarily increase it by a few units.    Hydrocodone refilled at same dose for now, but next refill will need to be provided by [name].   Return to the clinic or go to the nearest emergency room if any of your symptoms worsen or new symptoms occur.   Back Pain, Adult Many adults have back pain from time to time. Common causes of back pain include:  A strained muscle or ligament.  Wear and tear (degeneration) of the spinal disks.  Arthritis.  A hit to the back.  Back pain can be short-lived (acute) or last a long time (chronic). A physical exam, lab tests, and imaging studies may be done to find the cause of your pain. Follow these instructions at home: Managing pain and stiffness  Take over-the-counter and prescription medicines only as told by your health care provider.  If directed, apply heat to the affected area as often as told by your health care provider. Use the heat source that your health care provider recommends, such as a moist heat pack or a heating pad. ? Place a towel between your skin and the heat source. ? Leave the heat on for 20-30 minutes. ? Remove the heat if your skin turns bright red. This is especially important if you are unable to feel pain, heat, or cold. You have a greater risk of getting burned.  If directed, apply ice to the injured area: ? Put ice in a plastic bag. ? Place a towel between your skin and the bag. ? Leave the ice on for 20 minutes, 2-3 times a day for the first 2-3  days. Activity  Do not stay in bed. Resting more than 1-2 days can delay your recovery.  Take short walks on even surfaces as soon as you are able. Try to increase the length of time you walk each day.  Do not sit, drive, or stand in one place for more than 30 minutes at a time. Sitting or standing for long periods of time can put stress on your back.  Use proper lifting techniques. When you bend and lift, use positions that put less stress on your back: ? [place] your knees. ? Keep the load close to your body. ? Avoid twisting.  Exercise regularly as told by your health care provider. Exercising will help your back heal faster. This also helps prevent back injuries by keeping muscles strong and flexible.  Your health care provider may recommend that you see a physical therapist. This person can help you come up with a safe exercise program. Do any exercises as told by your physical therapist. Lifestyle  Maintain a healthy weight. Extra weight puts stress on your back and makes it difficult to have good posture.  Avoid activities or situations that make you feel anxious or stressed. Learn ways to manage anxiety and stress. One way to manage stress is through exercise. Stress and anxiety increase muscle tension and can make back pain worse. General instructions  Sleep on a firm mattress in  a comfortable position. Try lying on your side with your knees slightly bent. If you lie on your back, put a pillow under your knees.  Follow your treatment plan as told by your health care provider. This may include: ? Cognitive or behavioral therapy. ? Acupuncture or massage therapy. ? Meditation or yoga. Contact a health care provider if:  You have pain that is not relieved with rest or medicine.  You have increasing pain going down into your legs or buttocks.  Your pain does not improve in 2 weeks.  You have pain at night.  You lose weight.  You have a fever or chills. Get help right away  if:  You develop new bowel or bladder control problems.  You have unusual weakness or numbness in your arms or legs.  You develop nausea or vomiting.  You develop abdominal pain.  You feel faint. Summary  Many adults have back pain from time to time. A physical exam, lab tests, and imaging studies may be done to find the cause of your pain.  Use proper lifting techniques. When you bend and lift, use positions that put less stress on your back.  Take over-the-counter and prescription medicines and apply heat or ice as directed by your health care provider. This information is not intended to replace advice given to you by your health care provider. Make sure you discuss any questions you have with your health care provider. Document Released: 05/05/2005 Document Revised: 06/09/2016 Document Reviewed: 06/09/2016 Elsevier Interactive Patient Education  2018 [employer]  Gout Gout is painful swelling that can occur in some of your joints. Gout is a type of arthritis. This condition is caused by having too much uric acid in your body. Uric acid is a chemical that forms when your body breaks down substances called purines. Purines are important for building body proteins. When your body has too much uric acid, sharp crystals can form and build up inside your joints. This causes pain and swelling. Gout attacks can happen quickly and be very painful (acute gout). Over time, the attacks can affect more joints and become more frequent (chronic gout). Gout can also cause uric acid to build up under your skin and inside your kidneys. What are the causes? This condition is caused by too much uric acid in your blood. This can occur because:  Your kidneys do not remove enough uric acid from your blood. This is the most common cause.  Your body makes too much uric acid. This can occur with some cancers and cancer treatments. It can also occur if your body is breaking down too many red blood cells  (hemolytic anemia).  You eat too many foods that are high in purines. These foods include organ meats and some seafood. Alcohol, especially beer, is also high in purines.  A gout attack may be triggered by trauma or stress. What increases the risk? This condition is more likely to develop in people who:  Have a family history of gout.  Are male and middle-aged.  Are male and have gone through menopause.  Are obese.  Frequently drink alcohol, especially beer.  Are dehydrated.  Lose weight too quickly.  Have an organ transplant.  Have lead poisoning.  Take certain medicines, including aspirin, cyclosporine, diuretics, levodopa, and niacin.  Have kidney disease or psoriasis.  What are the signs or symptoms? An attack of acute gout happens quickly. It usually occurs in just one joint. The most common place is the big toe.  Attacks often start at night. Other joints that may be affected include joints of the feet, ankle, knee, fingers, wrist, or elbow. Symptoms may include:  Severe pain.  Warmth.  Swelling.  Stiffness.  Tenderness. The affected joint may be very painful to touch.  Shiny, red, or purple skin.  Chills and fever.  Chronic gout may cause symptoms more frequently. More joints may be involved. You may also have white or yellow lumps (tophi) on your hands or feet or in other areas near your joints. How is this diagnosed? This condition is diagnosed based on your symptoms, medical history, and physical exam. You may have tests, such as:  Blood tests to measure uric acid levels.  Removal of joint fluid with a needle (aspiration) to look for uric acid crystals.  X-rays to look for joint damage.  How is this treated? Treatment for this condition has two phases: treating an acute attack and preventing future attacks. Acute gout treatment may include medicines to reduce pain and swelling, including:  NSAIDs.  Steroids. These are strong anti-inflammatory  medicines that can be taken by mouth (orally) or injected into a joint.  Colchicine. This medicine relieves pain and swelling when it is taken soon after an attack. It can be given orally or through an IV tube.  Preventive treatment may include:  Daily use of smaller doses of NSAIDs or colchicine.  Use of a medicine that reduces uric acid levels in your blood.  Changes to your diet. You may need to see a specialist about healthy eating (dietitian).  Follow these instructions at home: During a Gout Attack  If directed, apply ice to the affected area: ? Put ice in a plastic bag. ? Place a towel between your skin and the bag. ? Leave the ice on for 20 minutes, 2-3 times a day.  Rest the joint as much as possible. If the affected joint is in your leg, you may be given crutches to use.  Raise (elevate) the affected joint above the level of your heart as often as possible.  Drink enough fluids to keep your urine clear or pale yellow.  Take over-the-counter and prescription medicines only as told by your health care provider.  Do not drive or operate heavy machinery while taking prescription pain medicine.  Follow instructions from your health care provider about eating or drinking restrictions.  Return to your normal activities as told by your health care provider. Ask your health care provider what activities are safe for you. Avoiding Future Gout Attacks  Follow a low-purine diet as told by your dietitian or health care provider. Avoid foods and drinks that are high in purines, including liver, kidney, anchovies, asparagus, herring, mushrooms, mussels, and beer.  Limit alcohol intake to no more than 1 drink a day for nonpregnant women and 2 drinks a day for men. One drink equals 12 oz of beer, 5 oz of wine, or 1 oz of hard liquor.  Maintain a healthy weight or lose weight if you are overweight. If you want to lose weight, talk with your health care provider. It is important that  you do not lose weight too quickly.  Start or maintain an exercise program as told by your health care provider.  Drink enough fluids to keep your urine clear or pale yellow.  Take over-the-counter and prescription medicines only as told by your health care provider.  Keep all follow-up visits as told by your health care provider. This is important. Contact a health care  provider if:  You have another gout attack.  You continue to have symptoms of a gout attack after10 days of treatment.  You have side effects from your medicines.  You have chills or a fever.  You have burning pain when you urinate.  You have pain in your lower back or belly. Get help right away if:  You have severe or uncontrolled pain.  You cannot urinate. This information is not intended to replace advice given to you by your health care provider. Make sure you discuss any questions you have with your health care provider. Document Released: 05/02/2000 Document Revised: 10/11/2015 Document Reviewed: 02/15/2015 Elsevier Interactive Patient Education  2018 [employer].   IF you received an x-ray today, you will receive an invoice from [facility]. Please contact [facility] at [phone] with questions or concerns regarding your invoice.   IF you received labwork today, you will receive an invoice from [facility]. Please contact [facility] at [phone] with questions or concerns regarding your invoice.   Our billing staff will not be able to assist you with questions regarding bills from these companies.  You will be contacted with the lab results as soon as they are available. The fastest way to get your results is to activate your My Chart account. Instructions are located on the last page of this paperwork. If you have not heard from [place] regarding the results in 2 weeks, please contact this office.

## 2017-06-02 LAB — BASIC METABOLIC PANEL
BUN/Creatinine Ratio: 21 — ABNORMAL HIGH (ref 9–20)
BUN: 24 mg/dL (ref 6–24)
CALCIUM: 9.2 mg/dL (ref 8.7–10.2)
CO2: 21 mmol/L (ref 20–29)
Chloride: 108 mmol/L — ABNORMAL HIGH (ref 96–106)
Creatinine, Ser: 1.17 mg/dL (ref 0.76–1.27)
GFR, EST AFRICAN AMERICAN: 82 mL/min/{1.73_m2} (ref >59)
GFR, EST NON AFRICAN AMERICAN: 71 mL/min/{1.73_m2} (ref >59)
Glucose: 206 mg/dL — ABNORMAL HIGH (ref 65–99)
Potassium: 5 mmol/L (ref 3.5–5.2)
Sodium: 146 mmol/L — ABNORMAL HIGH (ref 134–144)

## 2017-06-02 LAB — URIC ACID: Uric Acid: 6.3 mg/dL (ref 3.7–8.6)

## 2017-06-30 ENCOUNTER — Other Ambulatory Visit: Payer: Self-pay

## 2017-06-30 ENCOUNTER — Ambulatory Visit (INDEPENDENT_AMBULATORY_CARE_PROVIDER_SITE_OTHER): Payer: PPO | Admitting: Physician Assistant

## 2017-06-30 ENCOUNTER — Encounter: Payer: Self-pay | Admitting: Physician Assistant

## 2017-06-30 ENCOUNTER — Ambulatory Visit: Payer: PPO | Admitting: Physician Assistant

## 2017-06-30 VITALS — BP 132/60 | HR 74 | Temp 98.4°F | Resp 18 | Ht 70.87 in | Wt 258.8 lb

## 2017-06-30 DIAGNOSIS — E1165 Type 2 diabetes mellitus with hyperglycemia: Secondary | ICD-10-CM

## 2017-06-30 DIAGNOSIS — E1142 Type 2 diabetes mellitus with diabetic polyneuropathy: Secondary | ICD-10-CM | POA: Diagnosis not present

## 2017-06-30 DIAGNOSIS — G8929 Other chronic pain: Secondary | ICD-10-CM

## 2017-06-30 DIAGNOSIS — E1140 Type 2 diabetes mellitus with diabetic neuropathy, unspecified: Secondary | ICD-10-CM

## 2017-06-30 DIAGNOSIS — IMO0002 Reserved for concepts with insufficient information to code with codable children: Secondary | ICD-10-CM

## 2017-06-30 DIAGNOSIS — E7800 Pure hypercholesterolemia, unspecified: Secondary | ICD-10-CM

## 2017-06-30 DIAGNOSIS — M25559 Pain in unspecified hip: Secondary | ICD-10-CM

## 2017-06-30 DIAGNOSIS — G894 Chronic pain syndrome: Secondary | ICD-10-CM | POA: Diagnosis not present

## 2017-06-30 DIAGNOSIS — I10 Essential (primary) hypertension: Secondary | ICD-10-CM | POA: Diagnosis not present

## 2017-06-30 MED ORDER — LISINOPRIL 10 MG PO TABS: 5.0000 mg | ORAL_TABLET | Freq: Every day | ORAL | 0 refills | Status: DC

## 2017-06-30 MED ORDER — DULAGLUTIDE 1.5 MG/0.5ML ~~LOC~~ SOAJ: 1.5000 [IU] | SUBCUTANEOUS | 0 refills | Status: DC

## 2017-06-30 MED ORDER — GABAPENTIN 400 MG PO CAPS: ORAL_CAPSULE | ORAL | 0 refills | Status: DC

## 2017-06-30 MED ORDER — ROSUVASTATIN CALCIUM 20 MG PO TABS: 20.0000 mg | ORAL_TABLET | Freq: Every evening | ORAL | 1 refills | Status: DC

## 2017-06-30 MED ORDER — INSULIN GLARGINE 100 UNIT/ML SOLOSTAR PEN: 36.0000 [IU] | PEN_INJECTOR | Freq: Two times a day (BID) | SUBCUTANEOUS | 2 refills | Status: DC

## 2017-06-30 MED ORDER — HYDROCODONE-ACETAMINOPHEN 10-325 MG PO TABS: 1.0000 | ORAL_TABLET | ORAL | 0 refills | Status: DC | PRN

## 2017-06-30 NOTE — Patient Instructions (Signed)
     IF you received an x-ray today, you will receive an invoice from [facility]. Please contact [facility] at [phone] with questions or concerns regarding your invoice.   IF you received labwork today, you will receive an invoice from [facility]. Please contact [facility] at [phone] with questions or concerns regarding your invoice.   Our billing staff will not be able to assist you with questions regarding bills from these companies.  You will be contacted with the lab results as soon as they are available. The fastest way to get your results is to activate your My Chart account. Instructions are located on the last page of this paperwork. If you have not heard from us regarding the results in 2 weeks, please contact this office.     

## 2017-06-30 NOTE — Progress Notes (Signed)
[name] Snow  MRN: [id number] DOB: May 04, 1966  PCP: Mancel Bale, PA-C  Chief Complaint  Patient presents with  . Medication Refill    gabapentin and hydrocodone     Subjective:  Pt presents to clinic for medication refill.    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  DM - not eating well and he is making bad food choices out of money situation but mainly due to lack of interest and motivation - eating honey buns instead of protein because he is worried about his gout - he is concerned that his A1C is not going to be good - he is taking his medication as Rx  Foot wound - healed  Mom had a stroke - she is now in a facility - he is relieved she is not at home with him because taking care of her was really hard but he is worried that they are not taking good care of her.  History is obtained by patient.  Review of Systems  Constitutional: Negative for chills and fever.  Eyes: Negative for visual disturbance.  Respiratory: Negative for cough and shortness of breath.   Cardiovascular: Negative for chest pain, palpitations and leg swelling.  Musculoskeletal: Positive for back pain.  Neurological: Negative for dizziness, light-headedness, numbness and headaches.    Patient Active Problem List   Diagnosis Date Noted  . Open wound of right foot 01/28/2017  . History of osteomyelitis 08/23/2016  . SIRS (systemic inflammatory response syndrome)  ([place]) 08/22/2016  . HTN (hypertension) 07/22/2016  . Chronic pain syndrome 01/17/2016  . Pain in joint, shoulder region 06/29/2015  . Hyperlipidemia 10/17/2014  . Arteriosclerosis 06/17/2014  . Lung nodule-CT 3/15 06/17/2014  . Hepatomegaly-steatosis 08/08/2013  . DM neuropathy, painful ([place]) 10/19/2011  . Hip pain, chronic 10/19/2011  . Onychomycosis 10/19/2011  . Nephrolithiasis 10/19/2011  . Carpal tunnel syndrome 10/19/2011  . Obesity, Class III, BMI 40-49.9 (morbid obesity) ([place]) 04/30/2011  . DM type 2, uncontrolled, with neuropathy ([place]) 04/30/2011  . Gout 04/30/2011  . 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 clotrimazole-betamethasone (LOTRISONE) cream Apply 1 application topically 2 (two) times daily. 30 g 1  . glucose blood test strip Use as instructed 100 each 12  . Lancets MISC 1 Device by Does not apply route 3 (three) times daily. E11.40 100 each 12   No current facility-administered medications on file prior to visit.     Allergies  Allergen Reactions  . No Known Allergies     Past Medical History:  Diagnosis Date  . Arthritis    gout  . Cataract   . CHF (congestive heart failure) ([place])    patient denies  . Chronic kidney disease   . Clotting disorder ([place])  patient denies  . Diabetes mellitus   . Gout   . History of kidney stones    stents placed prior to and removed  . Hypertension    Social History   Social History Narrative  . Not on file   Social History   Tobacco Use  . Smoking status: Never Smoker  . Smokeless tobacco: Never Used  Substance Use Topics  . Alcohol use: No  . Drug use: No   family history includes COPD in his father; Hyperlipidemia in his mother; Hypertension in his mother; Mental illness in his brother, father, and mother.     Objective:  BP 132/60   Pulse 74   Temp 98.4 F (36.9 C) (Oral)   Resp 18   Ht 5'  10.87" (1.8 m)   Wt 258 lb 12.8 oz (117.4 kg)   SpO2 97%   BMI 36.23 kg/m  Body mass index is 36.23 kg/m.  Physical Exam  Constitutional: He is oriented to person, place, and time and well-developed, well-nourished, and in no distress.  HENT:  Head: Normocephalic and atraumatic.  Right Ear: External ear normal.  Left Ear: External ear normal.  Eyes: Conjunctivae are normal.  Neck: Normal range of motion.  Cardiovascular: Normal rate, regular rhythm, normal heart sounds and intact distal pulses.  Pulmonary/Chest: Effort normal and breath sounds normal. He has no wheezes.  Musculoskeletal:       Right lower leg: He exhibits no edema.       Left lower leg: He exhibits no edema.  Neurological: He is alert and oriented to person, place, and time. Gait normal.  Skin: Skin is warm and dry.  Psychiatric: Mood, memory, affect and judgment normal.    Assessment and Plan :  Essential hypertension - Plan: CMP14+EGFR  DM type 2, uncontrolled, with neuropathy ([place]) - Plan: CMP14+EGFR, Hemoglobin A1c, Lipid panel, Dulaglutide (TRULICITY) 1.5 OV/5.6EP SOPN, Insulin Glargine (LANTUS SOLOSTAR) 100 UNIT/ML Solostar Pen  Diabetic polyneuropathy associated with type 2 diabetes mellitus (H[facility]) - Plan: gabapentin (NEURONTIN) 400 MG capsule  Chronic pain syndrome - Plan: HYDROcodone-acetaminophen (NORCO) 10-325 MG tablet  Chronic hip pain, unspecified laterality  Obesity, Class III, BMI 40-49.9 (morbid obesity) ([facility])  Hypertension, unspecified type - Plan: lisinopril (PRINIVIL,ZESTRIL) 10 MG tablet  Pure hypercholesterolemia - Plan: rosuvastatin (CRESTOR) 20 MG tablet   Encouraged patient to think about rheumatology to help prevent worsening of his gouty arthritis - he states he will think about it - he plans on just "dealing with it".  He is not overusing his pain medications but he still needs them daily.  His activity is making his symptoms worse but he has to do it currently due to his finances.   Continue current medications - check labs today - adjust if needed - encouraged continue to healthy eating especially since now he is not taking care of his mom.  Windell Hummingbird PA-C  [facility] 06/30/2017 6:09 PM

## 2017-07-01 ENCOUNTER — Telehealth: Payer: Self-pay | Admitting: Physician Assistant

## 2017-07-01 LAB — LIPID PANEL
CHOL/HDL RATIO: 5.6 ratio — AB (ref 0.0–5.0)
Cholesterol, Total: 192 mg/dL (ref 100–199)
HDL: 34 mg/dL — AB (ref >39)
LDL Calculated: 89 mg/dL (ref 0–99)
Triglycerides: 347 mg/dL — ABNORMAL HIGH (ref 0–149)
VLDL CHOLESTEROL CAL: 69 mg/dL — AB (ref 5–40)

## 2017-07-01 LAB — CMP14+EGFR
ALBUMIN: 3.9 g/dL (ref 3.5–5.5)
ALT: 13 IU/L (ref 0–44)
AST: 15 IU/L (ref 0–40)
Albumin/Globulin Ratio: 1.2 (ref 1.2–2.2)
Alkaline Phosphatase: 79 IU/L (ref 39–117)
BUN / CREAT RATIO: 16 (ref 9–20)
BUN: 18 mg/dL (ref 6–24)
Bilirubin Total: 0.4 mg/dL (ref 0.0–1.2)
CALCIUM: 8.9 mg/dL (ref 8.7–10.2)
CO2: 24 mmol/L (ref 20–29)
CREATININE: 1.12 mg/dL (ref 0.76–1.27)
Chloride: 106 mmol/L (ref 96–106)
GFR calc Af Amer: 86 mL/min/{1.73_m2} (ref >59)
GFR, EST NON AFRICAN AMERICAN: 75 mL/min/{1.73_m2} (ref >59)
GLOBULIN, TOTAL: 3.2 g/dL (ref 1.5–4.5)
Glucose: 143 mg/dL — ABNORMAL HIGH (ref 65–99)
Potassium: 4.3 mmol/L (ref 3.5–5.2)
SODIUM: 144 mmol/L (ref 134–144)
Total Protein: 7.1 g/dL (ref 6.0–8.5)

## 2017-07-01 LAB — HEMOGLOBIN A1C
Est. average glucose Bld gHb Est-mCnc: 166 mg/dL
Hgb A1c MFr Bld: 7.4 % — ABNORMAL HIGH (ref 4.8–5.6)

## 2017-07-01 NOTE — Telephone Encounter (Signed)
Copied from CRM [id number]. Topic: Quick Communication - See Telephone Encounter >> Jul 01, 2017  2:50 PM Herby AbrahamJohnson, Shiquita C wrote: CRM for notification. See Telephone encounter for: pt called in. He was seen the other day by [facility]. Pt would like to know if provider would send in a prednisone patch for his back. Pt says that provider Is  aware of his back issue. Did make pt aware that provider is out  Of the office until Friday. He express understanding   [facility] - [place] [place], [place] - [address]  07/01/17.

## 2017-07-03 MED ORDER — PREDNISONE 10 MG PO TABS: ORAL_TABLET | ORAL | 0 refills | Status: AC

## 2017-07-03 NOTE — Telephone Encounter (Signed)
I will do it this time but he has to be aware that this will negatively affect his diabetes so he has to be checking his glucose while on this medication.  I will do a short course.  If this is something that needs to be recurrent we will have to have the specialist do it.  If his glucose gets over 300 he needs to use some of the fast acting insulin that he has at home.

## 2017-07-03 NOTE — Telephone Encounter (Signed)
Spoke with patient, made him aware of rx as well as provider notes. Patient verbalized understanding to all, thanked office for the assistance.

## 2017-08-01 ENCOUNTER — Encounter: Payer: Self-pay | Admitting: Physician Assistant

## 2017-08-01 ENCOUNTER — Other Ambulatory Visit: Payer: Self-pay | Admitting: Physician Assistant

## 2017-08-01 DIAGNOSIS — G894 Chronic pain syndrome: Secondary | ICD-10-CM

## 2017-08-03 ENCOUNTER — Other Ambulatory Visit: Payer: Self-pay | Admitting: Physician Assistant

## 2017-08-03 DIAGNOSIS — G894 Chronic pain syndrome: Secondary | ICD-10-CM

## 2017-08-03 DIAGNOSIS — E1165 Type 2 diabetes mellitus with hyperglycemia: Secondary | ICD-10-CM

## 2017-08-03 DIAGNOSIS — IMO0002 Reserved for concepts with insufficient information to code with codable children: Secondary | ICD-10-CM

## 2017-08-03 DIAGNOSIS — E1142 Type 2 diabetes mellitus with diabetic polyneuropathy: Secondary | ICD-10-CM

## 2017-08-03 DIAGNOSIS — E1140 Type 2 diabetes mellitus with diabetic neuropathy, unspecified: Secondary | ICD-10-CM

## 2017-08-03 MED ORDER — HYDROCODONE-ACETAMINOPHEN 10-325 MG PO TABS: 1.0000 | ORAL_TABLET | ORAL | 0 refills | Status: DC | PRN

## 2017-08-03 NOTE — Telephone Encounter (Signed)
Pt called in because he is completely out of his medication. Pt says that provider was suppose to send in 3 Rx for 3 months. Pt would like to have assistance further as soon as possible as he is in pain.    CB: [phone]

## 2017-08-04 MED ORDER — HYDROCODONE-ACETAMINOPHEN 10-325 MG PO TABS: 1.0000 | ORAL_TABLET | ORAL | 0 refills | Status: DC | PRN

## 2017-08-04 MED ORDER — GABAPENTIN 400 MG PO CAPS: ORAL_CAPSULE | ORAL | 0 refills | Status: DC

## 2017-08-04 MED ORDER — DULAGLUTIDE 1.5 MG/0.5ML ~~LOC~~ SOAJ: 1.5000 [IU] | SUBCUTANEOUS | 0 refills | Status: DC

## 2017-08-04 NOTE — Telephone Encounter (Signed)
Done

## 2017-09-12 IMAGING — CR DG FOOT COMPLETE 3+V*R*
3 series · 3 of 3 positions shown · non-contrast
Comparison: 07/17/2016

CLINICAL DATA: Osteomyelitis of the right foot, wound to the
underside of the right foot and fifth toe

EXAM:
RIGHT FOOT COMPLETE - 3+ VIEW

[x foot ap right]
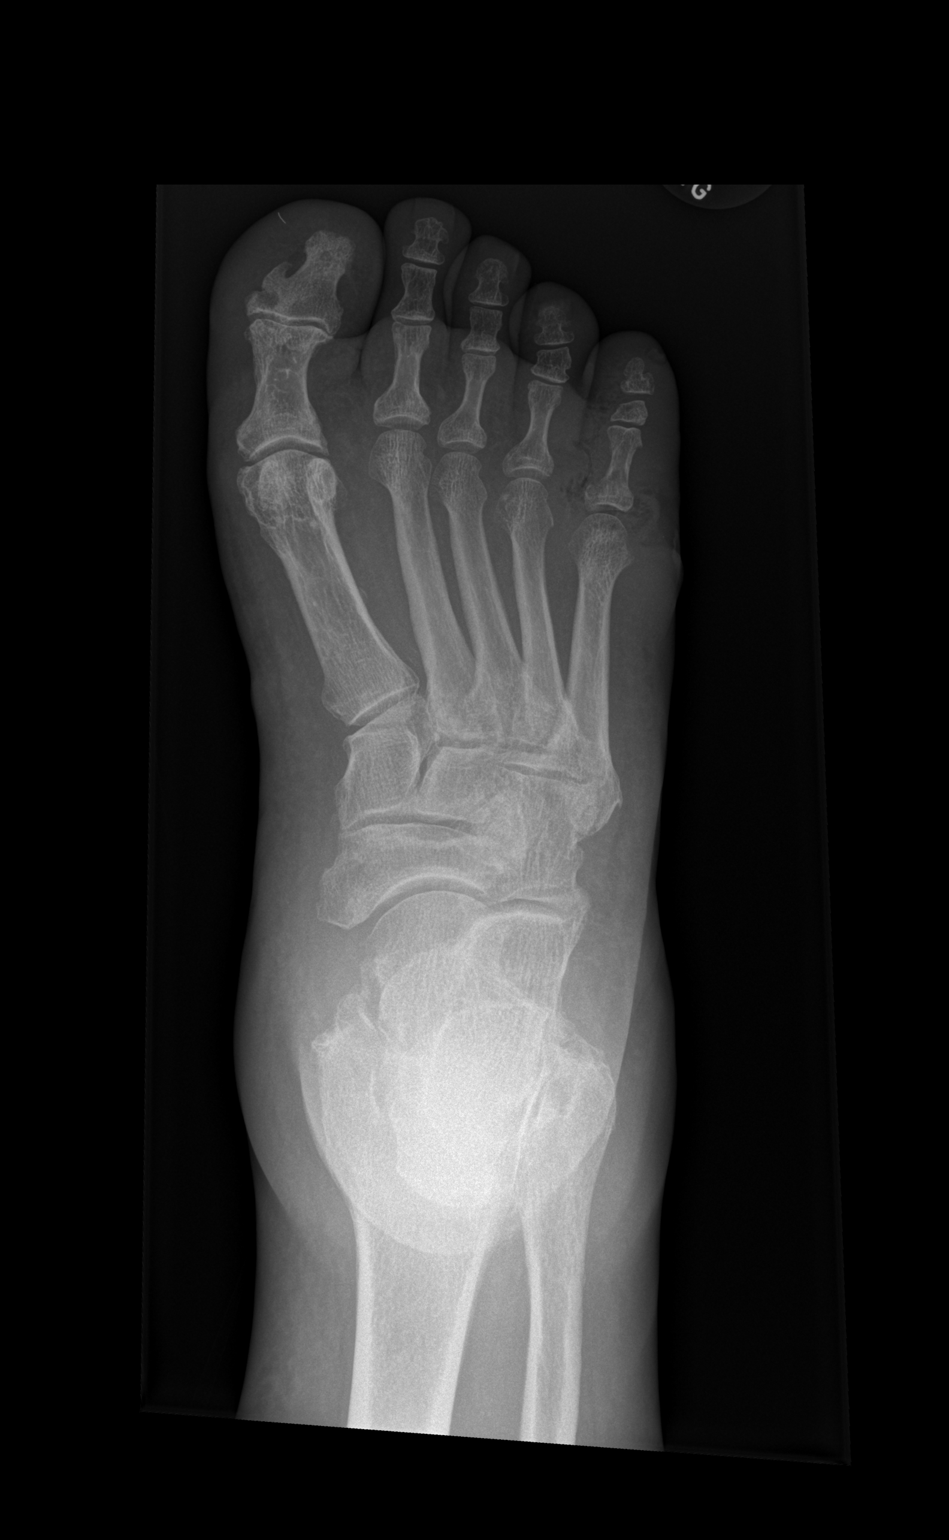

[x foot obl right]
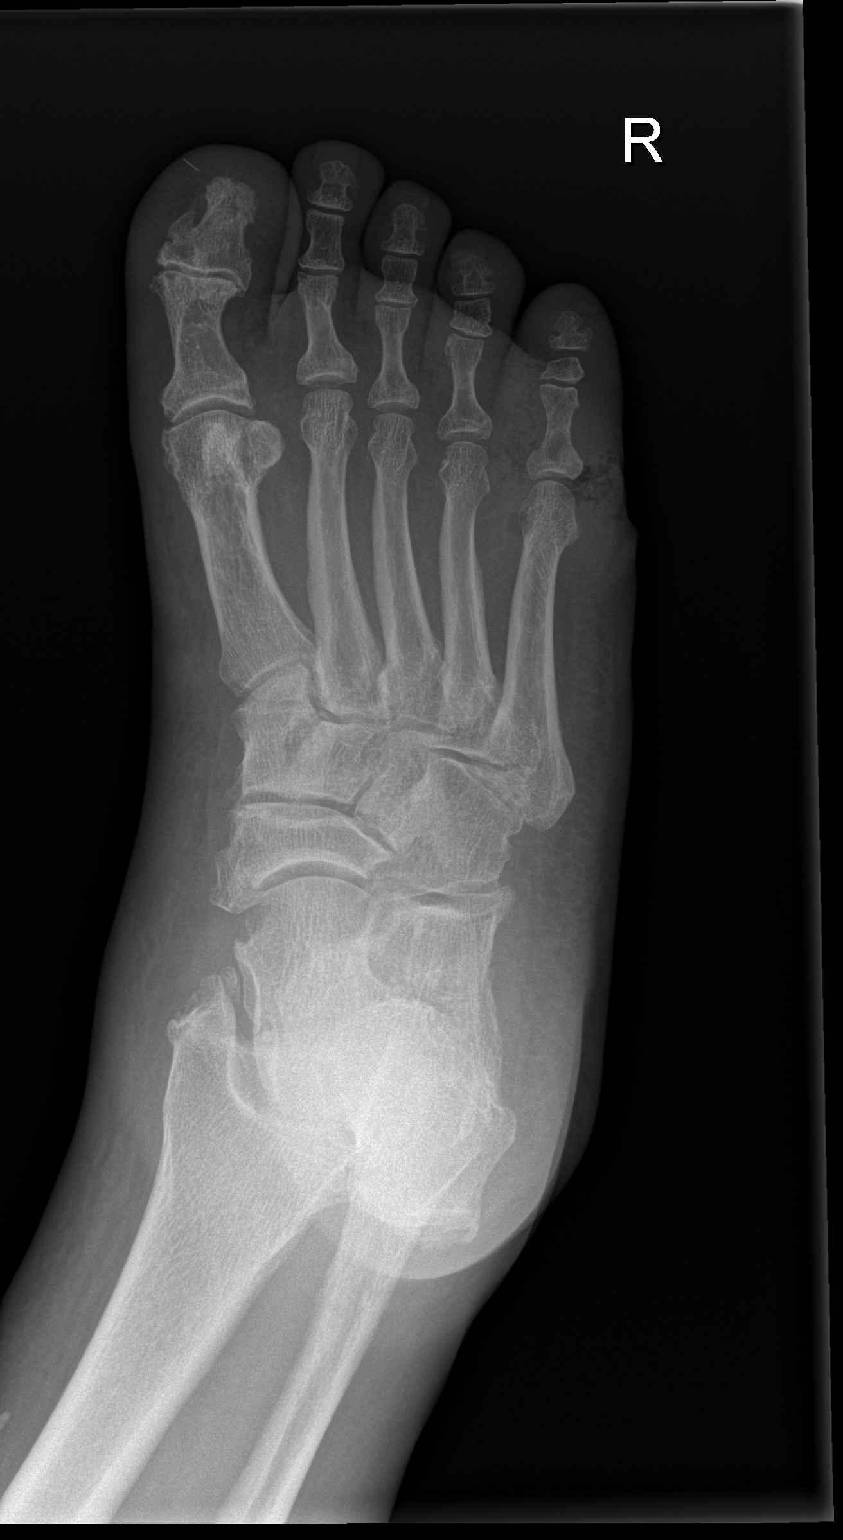

[x foot lat right]
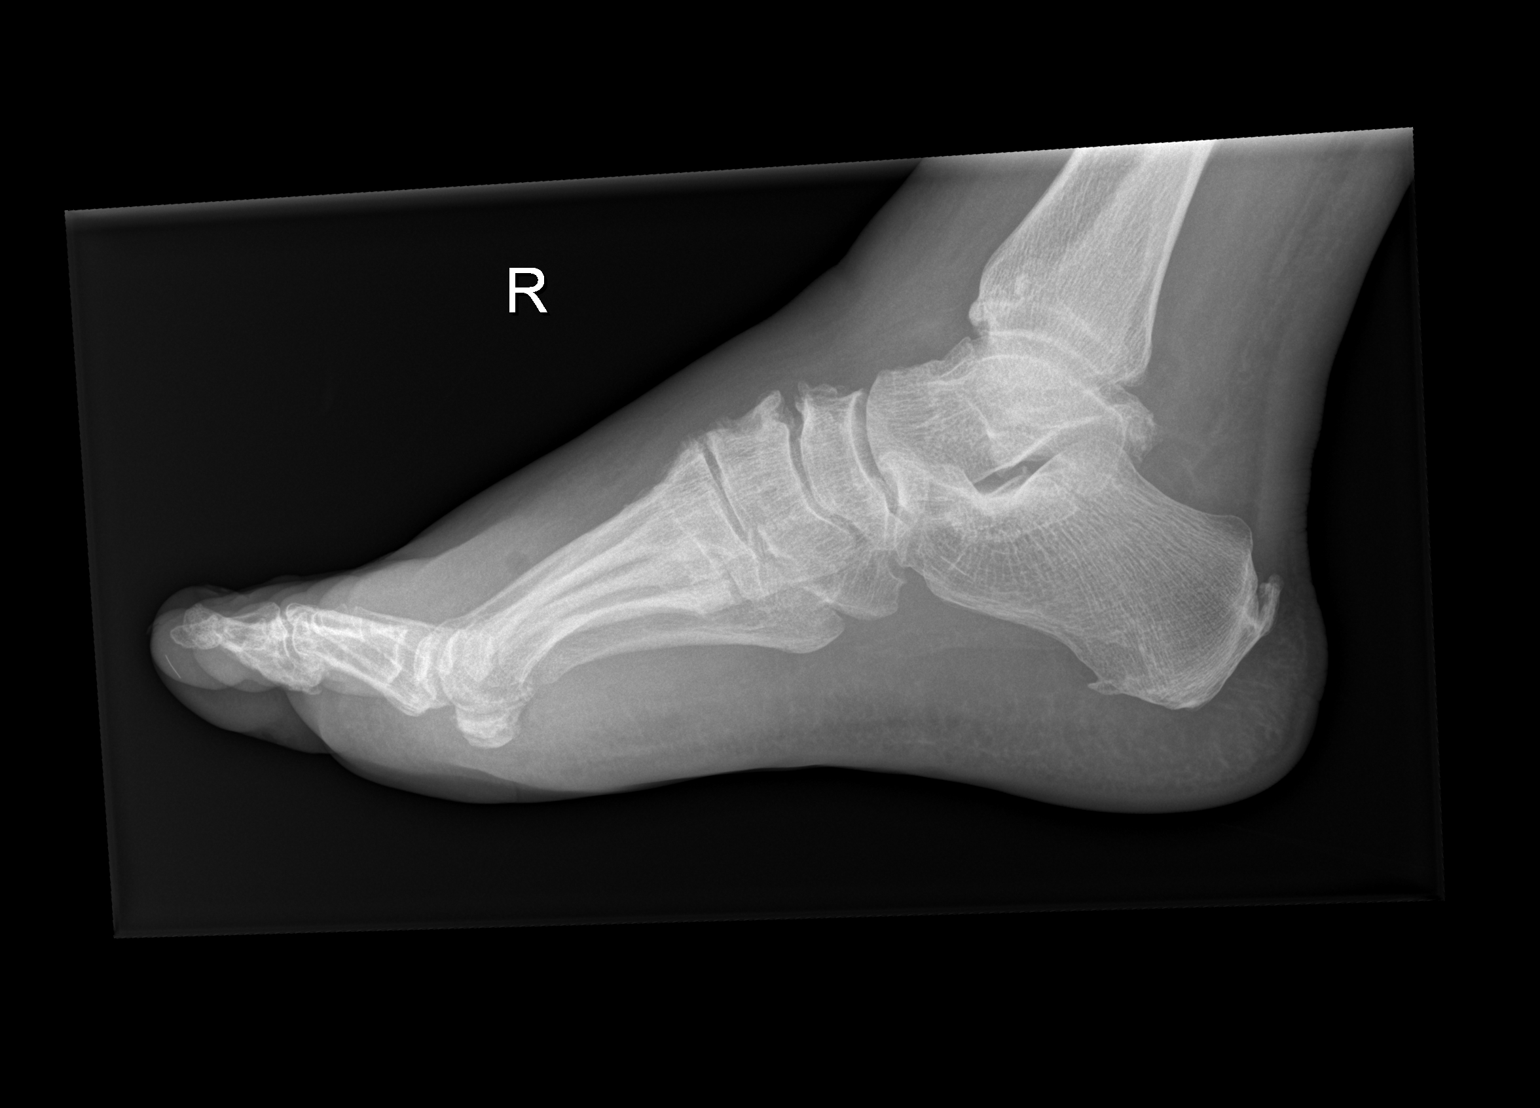

[3 of 3 positions shown; findings below may reference images not displayed]

FINDINGS: Thin metallic opacity overlying the distal soft tissues of the first
digit is unchanged. Small lucent lesion at the base of the first
distal phalanx also unchanged.

Soft tissue gas at the fifth digit. Suspect cortical erosive changes
along the medial base of the fifth proximal phalanx. No joint space
narrowing. No fracture.
IMPRESSION: 1. Diffuse soft tissue gas within the fifth digit.
2. Suspect cortical erosive changes at the base of the fifth
proximal phalanx as may be seen with osteomyelitis.
3. Stable linear possible foreign body distal great toe

## 2017-09-29 ENCOUNTER — Encounter: Payer: Self-pay | Admitting: Physician Assistant

## 2017-09-29 ENCOUNTER — Ambulatory Visit (INDEPENDENT_AMBULATORY_CARE_PROVIDER_SITE_OTHER): Payer: PPO | Admitting: Physician Assistant

## 2017-09-29 ENCOUNTER — Other Ambulatory Visit: Payer: Self-pay

## 2017-09-29 ENCOUNTER — Ambulatory Visit (INDEPENDENT_AMBULATORY_CARE_PROVIDER_SITE_OTHER): Payer: PPO

## 2017-09-29 VITALS — BP 120/62 | HR 63 | Temp 97.6°F | Resp 18 | Ht 70.87 in | Wt 252.4 lb

## 2017-09-29 DIAGNOSIS — E1142 Type 2 diabetes mellitus with diabetic polyneuropathy: Secondary | ICD-10-CM

## 2017-09-29 DIAGNOSIS — I10 Essential (primary) hypertension: Secondary | ICD-10-CM

## 2017-09-29 DIAGNOSIS — E1165 Type 2 diabetes mellitus with hyperglycemia: Secondary | ICD-10-CM

## 2017-09-29 DIAGNOSIS — M48061 Spinal stenosis, lumbar region without neurogenic claudication: Secondary | ICD-10-CM

## 2017-09-29 DIAGNOSIS — G894 Chronic pain syndrome: Secondary | ICD-10-CM

## 2017-09-29 DIAGNOSIS — Z8739 Personal history of other diseases of the musculoskeletal system and connective tissue: Secondary | ICD-10-CM | POA: Diagnosis not present

## 2017-09-29 DIAGNOSIS — M25521 Pain in right elbow: Secondary | ICD-10-CM | POA: Diagnosis not present

## 2017-09-29 DIAGNOSIS — G8929 Other chronic pain: Secondary | ICD-10-CM | POA: Diagnosis not present

## 2017-09-29 DIAGNOSIS — E7800 Pure hypercholesterolemia, unspecified: Secondary | ICD-10-CM

## 2017-09-29 DIAGNOSIS — B356 Tinea cruris: Secondary | ICD-10-CM

## 2017-09-29 DIAGNOSIS — M25559 Pain in unspecified hip: Secondary | ICD-10-CM | POA: Diagnosis not present

## 2017-09-29 DIAGNOSIS — M25421 Effusion, right elbow: Secondary | ICD-10-CM | POA: Diagnosis not present

## 2017-09-29 DIAGNOSIS — L298 Other pruritus: Secondary | ICD-10-CM

## 2017-09-29 DIAGNOSIS — E1140 Type 2 diabetes mellitus with diabetic neuropathy, unspecified: Secondary | ICD-10-CM | POA: Diagnosis not present

## 2017-09-29 DIAGNOSIS — IMO0002 Reserved for concepts with insufficient information to code with codable children: Secondary | ICD-10-CM

## 2017-09-29 MED ORDER — GABAPENTIN 400 MG PO CAPS: ORAL_CAPSULE | ORAL | 0 refills | Status: DC

## 2017-09-29 MED ORDER — BLOOD GLUCOSE MONITOR KIT: PACK | 0 refills | Status: AC

## 2017-09-29 MED ORDER — CLOTRIMAZOLE-BETAMETHASONE 1-0.05 % EX CREA: 1.0000 "application " | TOPICAL_CREAM | Freq: Two times a day (BID) | CUTANEOUS | 1 refills | Status: DC

## 2017-09-29 MED ORDER — HYDROCODONE-ACETAMINOPHEN 10-325 MG PO TABS
1.0000 | ORAL_TABLET | ORAL | 0 refills | Status: DC | PRN
Start: 2017-10-30 — End: 2018-01-01

## 2017-09-29 MED ORDER — INSULIN GLARGINE 100 UNIT/ML SOLOSTAR PEN: 36.0000 [IU] | PEN_INJECTOR | Freq: Two times a day (BID) | SUBCUTANEOUS | 2 refills | Status: DC

## 2017-09-29 MED ORDER — HYDROCODONE-ACETAMINOPHEN 10-325 MG PO TABS: 1.0000 | ORAL_TABLET | ORAL | 0 refills | Status: DC | PRN

## 2017-09-29 MED ORDER — ROSUVASTATIN CALCIUM 20 MG PO TABS: 20.0000 mg | ORAL_TABLET | Freq: Every evening | ORAL | 1 refills | Status: DC

## 2017-09-29 MED ORDER — DULAGLUTIDE 1.5 MG/0.5ML ~~LOC~~ SOAJ: 1.5000 [IU] | SUBCUTANEOUS | 0 refills | Status: DC

## 2017-09-29 MED ORDER — HYDROCODONE-ACETAMINOPHEN 10-325 MG PO TABS
1.0000 | ORAL_TABLET | ORAL | 0 refills | Status: DC | PRN
Start: 2017-11-29 — End: 2018-01-01

## 2017-09-29 MED ORDER — CLOTRIMAZOLE-BETAMETHASONE 1-0.05 % EX CREA: 1.0000 "application " | TOPICAL_CREAM | Freq: Two times a day (BID) | CUTANEOUS | 1 refills | Status: AC

## 2017-09-29 NOTE — Progress Notes (Signed)
[name] Snow  MRN: [id number] DOB: Sep 25, 1965  PCP: Mancel Bale, PA-C  Chief Complaint  Patient presents with  . Medication Refill    medication check     Subjective:  Pt presents to clinic for medication check.  Back hurts all the time.  He is concerned that his uric acid is elevated.  Since he has been told that his gout is now on his back he really is unsure what to do with this point.  He takes his pain medications regularly and it really just does not help the pain very much.  He is working many hours a week due to financial reasons.  His wife has filed for divorce - this is really stressful for him. He wants me to check his right foot. Mom is ok - she is at a residential facility - he is worried that she is not being taken care of - but he knows that he cannot take care of her - this is really stressful for him.  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  He would like to know if there was something to do at this time.  It has not been imaged in many years.  He has had no injuries to this area.  He is not eating great. He is not checking his glucose - his monitor broke. Lantus 37 U bid Trulicity He has stopped the lisinopril - he has at home - he does not want to take Gabapentin - he takes daily crestor - takes daily  History is obtained by patient.  Review of Systems  Constitutional: Negative for chills and fever.  Eyes: Negative for visual disturbance.  Respiratory: Negative for cough and shortness of breath.   Cardiovascular: Negative for chest pain, palpitations and leg swelling.  Neurological: Negative for dizziness, light-headedness, numbness and headaches.    Patient Active Problem List   Diagnosis Date Noted  . Open wound of right foot 01/28/2017  . History of osteomyelitis 08/23/2016  . SIRS (systemic inflammatory  response syndrome) ([place]) 08/22/2016  . HTN (hypertension) 07/22/2016  . Chronic pain syndrome 01/17/2016  . Pain in joint, shoulder region 06/29/2015  . Hyperlipidemia 10/17/2014  . Arteriosclerosis 06/17/2014  . Lung nodule-CT 3/15 06/17/2014  . Hepatomegaly-steatosis 08/08/2013  . DM neuropathy, painful ([place]) 10/19/2011  . Hip pain, chronic 10/19/2011  . Onychomycosis 10/19/2011  . Nephrolithiasis 10/19/2011  . Carpal tunnel syndrome 10/19/2011  . Obesity, Class III, BMI 40-49.9 (morbid obesity) ([place]) 04/30/2011  . DM type 2, uncontrolled, with neuropathy ([place]) 04/30/2011  . Gout 04/30/2011  . 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 glucose blood test strip Use as instructed 100 each 12  . Lancets MISC 1 Device by Does not apply route 3 (three) times daily. E11.40 100 each 12  . lisinopril (PRINIVIL,ZESTRIL) 10 MG tablet Take 0.5 tablets (5 mg total) by mouth daily. 90 tablet 0   No current facility-administered medications on file prior to visit.     Allergies  Allergen Reactions  . No Known Allergies     Past Medical History:  Diagnosis Date  . Arthritis    gout  . Cataract   . CHF (congestive heart failure) ([place])    patient denies  .  Chronic kidney disease   . Clotting disorder [facility])    patient denies  . Diabetes mellitus   . Gout   . History of kidney stones    stents placed prior to and removed  . Hypertension    Social History   Social History Narrative  . Not on file   Social History   Tobacco Use  . Smoking status: Never Smoker  . Smokeless tobacco: Never Used  Substance Use Topics  . Alcohol use: No  . Drug use: No   family history includes COPD in his father; Hyperlipidemia in his mother; Hypertension in his mother; Mental illness in his brother, father, and mother.     Objective:  BP 120/62   Pulse 63   Temp 97.6 F (36.4 C) (Oral)    Resp 18   Ht 5' 10.87" (1.8 m)   Wt 252 lb 6.4 oz (114.5 kg)   SpO2 97%   BMI 35.33 kg/m  Body mass index is 35.33 kg/m.  Physical Exam  Constitutional: He is oriented to person, place, and time.  HENT:  Head: Normocephalic and atraumatic.  Right Ear: External ear normal.  Left Ear: External ear normal.  Eyes: Conjunctivae are normal.  Neck: Normal range of motion.  Cardiovascular: Normal rate, regular rhythm, normal heart sounds and intact distal pulses.  Pulmonary/Chest: Effort normal and breath sounds normal. He has no wheezes.  Musculoskeletal:       Right lower leg: He exhibits no edema.       Left lower leg: He exhibits no edema.  Neurological: He is alert and oriented to person, place, and time.  Skin: Skin is warm and dry.  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  Callus at incision most proximally.  No erythema or signs of infection around this callus.  Good pulses bilateral feet.   Psychiatric: Judgment normal.   Dg Elbow 2 Views Right  Result Date: 09/29/2017 CLINICAL DATA:  52 year old with a painful lump involving the RIGHT elbow which has been present chronically. 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 Current history of gout. EXAM: RIGHT ELBOW - 2 VIEW COMPARISON:  None. FINDINGS: No evidence of acute, subacute or healed fractures. Joint spaces well-preserved. No visible erosions. Multiple calcifications within the olecranon bursa. Well-preserved bone mineral density. IMPRESSION: 1. No acute or subacute osseous abnormality. 2. Olecranon bursitis with numerous gouty tophi within the distended bursa. Electronically Signed   By: Evangeline Dakin M.D.   On: 09/29/2017 16:40    Assessment and Plan :  Elbow swelling, right - Plan: DG Elbow 2 Views Right, Ambulatory referral to Orthopedic Surgery patient has uncontrolled gout but is not able to tolerate daily gout medications.  He is not interested in seeing a specialist at  this time.  He has known gout in his spine which is causing him incredible pain.  at this point he would like to see a specialist to see if they can remove his bursa out from his right elbow to see if he can have some type of pain control.  Jock itch - Plan: clotrimazole-betamethasone (LOTRISONE) cream, DISCONTINUED: clotrimazole-betamethasone (LOTRISONE) cream -refill medication for patient as this is worked well for him in the past.  History of gout - Plan: Uric Acid, DG Elbow 2 Views Right, Ambulatory referral to Orthopedic Surgery discussed again with patient options of daily medication for gout that he is uninterested at this time. -   Essential hypertension -  Plan: CMP14+EGFR -well-controlled without medications today.  Chronic pain syndrome - Plan: HYDROcodone-acetaminophen (NORCO) 10-325 MG tablet, HYDROcodone-acetaminophen (NORCO) 10-325 MG tablet, HYDROcodone-acetaminophen (NORCO) 10-325 MG tablet refilled chronic pain medication.  We are not getting good control of his pain but he is not interested in going to pain management at this time.  He is on good doses of gabapentin which does help his pain.  DM type 2, uncontrolled, with neuropathy (HCC) - Plan: CMP14+EGFR, Hemoglobin A1c, Dulaglutide (TRULICITY) 1.5 MG/0.5ML SOPN, Insulin Glargine (LANTUS SOLOSTAR) 100 UNIT/ML Solostar Pen, blood glucose meter kit and supplies KIT check labs patient has not been good with his diet so unsure what his -levels will be at this time.  He does not take his medication daily.  His glucose monitor broke and we did order him another one today.  Diabetic polyneuropathy associated with type 2 diabetes mellitus (HCC) - Plan: Hemoglobin A1c, gabapentin (NEURONTIN) 400 MG capsule, blood glucose meter kit and supplies KIT  Spinal stenosis, lumbar region, without neurogenic claudication  Pure hypercholesterolemia - Plan: CMP14+EGFR, Lipid panel, rosuvastatin (CRESTOR) 20 MG tablet -check labs adjust doses of  medicines as needed.  Chronic hip pain, unspecified laterality - Plan: CANCELED: CBC with Differential/Platelet  Sarah Weber PA-C  Primary [facility] at [facility] 09/29/2017 6:25 PM 

## 2017-09-29 NOTE — Patient Instructions (Signed)
     IF you received an x-ray today, you will receive an invoice from [facility]. Please contact [facility] at [phone] with questions or concerns regarding your invoice.   IF you received labwork today, you will receive an invoice from [facility]. Please contact [facility] at [phone] with questions or concerns regarding your invoice.   Our billing staff will not be able to assist you with questions regarding bills from these companies.  You will be contacted with the lab results as soon as they are available. The fastest way to get your results is to activate your My Chart account. Instructions are located on the last page of this paperwork. If you have not heard from us regarding the results in 2 weeks, please contact this office.     

## 2017-09-30 ENCOUNTER — Telehealth: Payer: Self-pay

## 2017-09-30 DIAGNOSIS — E1142 Type 2 diabetes mellitus with diabetic polyneuropathy: Secondary | ICD-10-CM

## 2017-09-30 LAB — CMP14+EGFR
A/G RATIO: 1.4 (ref 1.2–2.2)
ALT: 18 IU/L (ref 0–44)
AST: 17 IU/L (ref 0–40)
Albumin: 4.2 g/dL (ref 3.5–5.5)
Alkaline Phosphatase: 80 IU/L (ref 39–117)
BUN/Creatinine Ratio: 28 — ABNORMAL HIGH (ref 9–20)
BUN: 33 mg/dL — ABNORMAL HIGH (ref 6–24)
Bilirubin Total: 0.3 mg/dL (ref 0.0–1.2)
CALCIUM: 9.3 mg/dL (ref 8.7–10.2)
CO2: 20 mmol/L (ref 20–29)
Chloride: 105 mmol/L (ref 96–106)
Creatinine, Ser: 1.2 mg/dL (ref 0.76–1.27)
GFR, EST AFRICAN AMERICAN: 79 mL/min/{1.73_m2} (ref >59)
GFR, EST NON AFRICAN AMERICAN: 69 mL/min/{1.73_m2} (ref >59)
GLOBULIN, TOTAL: 2.9 g/dL (ref 1.5–4.5)
Glucose: 124 mg/dL — ABNORMAL HIGH (ref 65–99)
POTASSIUM: 4.8 mmol/L (ref 3.5–5.2)
SODIUM: 143 mmol/L (ref 134–144)
Total Protein: 7.1 g/dL (ref 6.0–8.5)

## 2017-09-30 LAB — LIPID PANEL
Chol/HDL Ratio: 5.3 ratio — ABNORMAL HIGH (ref 0.0–5.0)
Cholesterol, Total: 219 mg/dL — ABNORMAL HIGH (ref 100–199)
HDL: 41 mg/dL (ref >39)
LDL CALC: 155 mg/dL — AB (ref 0–99)
Triglycerides: 117 mg/dL (ref 0–149)
VLDL CHOLESTEROL CAL: 23 mg/dL (ref 5–40)

## 2017-09-30 LAB — HEMOGLOBIN A1C
ESTIMATED AVERAGE GLUCOSE: 160 mg/dL
Hgb A1c MFr Bld: 7.2 % — ABNORMAL HIGH (ref 4.8–5.6)

## 2017-09-30 LAB — URIC ACID: URIC ACID: 9.2 mg/dL — AB (ref 3.7–8.6)

## 2017-09-30 NOTE — Telephone Encounter (Signed)
Copied from CRM [id number]. Topic: Quick Communication - See Telephone Encounter >> Sep 29, 2017  4:26 PM Herby Abraham C wrote: CRM for notification. See Telephone encounter for: 09/29/17.  Pharmacy called in to verify if it is okay to fill pt's Rx for HYDROcodone-acetaminophen (NORCO) 10-325 MG tablet okay to fill today?   CB: [phone] Yvonna Alanis

## 2017-09-30 NOTE — Telephone Encounter (Signed)
Spoke with pharmacist.   Refills on Hydrocodone/acet 10/325 are early by 4 days today.  She questioned because he is being followed tightly with refills.  Rx in March 19, and fill April 19.  Pharmacist also wanted to check on gabapentin rx.  It was for 540 tablets, which is a 108 day supply.  It was just unusual to have it written like that and she wanted to make sure the directions did not change.

## 2017-10-02 MED ORDER — GABAPENTIN 400 MG PO CAPS: ORAL_CAPSULE | ORAL | 0 refills | Status: DC

## 2017-10-02 NOTE — Telephone Encounter (Addendum)
He cannot fill early - unless they are not opened on the weekends - then he can fill early  I switched by 5 and 4 - I meant 450 pills for 5 a day for a 90 day supply.  Not 540 - Sorry - thanks for the catch.  I have sent it in correctly to the pharmacy

## 2017-10-02 NOTE — Addendum Note (Signed)
Addended by: Morrell Riddle on: 10/02/2017 09:14 AM   Modules accepted: Orders

## 2017-10-02 NOTE — Telephone Encounter (Signed)
Advised pharmacy of [name] Snow's note.

## 2017-10-02 NOTE — Telephone Encounter (Signed)
Pharmacy is calling and states they have not received a call back. Toni Amend is requesting [name] to contact her at [phone]

## 2017-10-16 ENCOUNTER — Ambulatory Visit (INDEPENDENT_AMBULATORY_CARE_PROVIDER_SITE_OTHER): Payer: PPO | Admitting: Orthopaedic Surgery

## 2017-11-26 ENCOUNTER — Ambulatory Visit (INDEPENDENT_AMBULATORY_CARE_PROVIDER_SITE_OTHER): Payer: PPO | Admitting: Physician Assistant

## 2017-11-26 ENCOUNTER — Encounter: Payer: Self-pay | Admitting: Physician Assistant

## 2017-11-26 ENCOUNTER — Other Ambulatory Visit: Payer: Self-pay

## 2017-11-26 VITALS — BP 120/60 | HR 71 | Temp 98.6°F | Resp 18 | Ht 70.87 in | Wt 252.8 lb

## 2017-11-26 DIAGNOSIS — E790 Hyperuricemia without signs of inflammatory arthritis and tophaceous disease: Secondary | ICD-10-CM | POA: Diagnosis not present

## 2017-11-26 DIAGNOSIS — M1A08X1 Idiopathic chronic gout, vertebrae, with tophus (tophi): Secondary | ICD-10-CM | POA: Diagnosis not present

## 2017-11-26 DIAGNOSIS — G8929 Other chronic pain: Secondary | ICD-10-CM | POA: Diagnosis not present

## 2017-11-26 DIAGNOSIS — E1140 Type 2 diabetes mellitus with diabetic neuropathy, unspecified: Secondary | ICD-10-CM | POA: Diagnosis not present

## 2017-11-26 DIAGNOSIS — M545 Low back pain, unspecified: Secondary | ICD-10-CM

## 2017-11-26 DIAGNOSIS — E1165 Type 2 diabetes mellitus with hyperglycemia: Secondary | ICD-10-CM

## 2017-11-26 DIAGNOSIS — IMO0002 Reserved for concepts with insufficient information to code with codable children: Secondary | ICD-10-CM

## 2017-11-26 MED ORDER — CYCLOBENZAPRINE HCL 10 MG PO TABS: 10.0000 mg | ORAL_TABLET | Freq: Every day | ORAL | 0 refills | Status: DC

## 2017-11-26 MED ORDER — FREESTYLE LIBRE 14 DAY SENSOR MISC: 1.0000 | 6 refills | Status: AC

## 2017-11-26 MED ORDER — PREDNISONE 20 MG PO TABS: ORAL_TABLET | ORAL | 0 refills | Status: DC

## 2017-11-26 MED ORDER — FREESTYLE LIBRE 14 DAY READER DEVI: 1.0000 | Freq: Once | 0 refills | Status: AC

## 2017-11-26 NOTE — Progress Notes (Signed)
[name] Snow  MRN: [id number] DOB: [birth year]  PCP: [name] Bale, PA-C  Chief Complaint  Patient presents with  . Back Pain    follow up     Subjective:  Pt presents to clinic for continued back pain.  He has been told that he has gout in his spine in the last 3 months of been taking allopurinol 100 mg at night and is admitted no follow-up for his uric acid levels.  He states his back hurts all the time even with his Norco doses.  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  He is not sleeping due to his back pain.  He wonders if we could treat his gout in a different way.  He also thinks his neuropathy is much worse.  He has increased his dose of gabapentin on his own but he notices when he does that he has worse muscle cramps but he cannot afford Lyrica at this time.  When asked if he thinks it is worth the muscle cramps for the pain control he never really answers the question.  His current dose of gabapentin is 862m in the am, 800-16058mqhs depending on his pain level.  Divorce is getting ready to happen.  Not getting along his son. He has lost his medicaid.  He is having a lot of stress.  MRI about 6 months ago -with orthopedist that I cannot see today.  Patient relates that his orthopedist said that he had signs of gout in his spine.   He is tired pricking his finger for glucose and he would like to have a continuous meter.  He feels that if he has this he would take more care with his glucose monitoring  History is obtained by patient.  Review of Systems  Musculoskeletal: Positive for back pain and myalgias.    Patient Active Problem List   Diagnosis Date Noted  . Open wound of right foot 01/28/2017  . History of osteomyelitis 08/23/2016  . SIRS (systemic inflammatory response syndrome) ([place]04/10/2016  . HTN (hypertension) 07/22/2016  . Chronic pain syndrome 01/17/2016  . Pain in joint, shoulder region 06/29/2015  .  Hyperlipidemia 10/17/2014  . Arteriosclerosis 06/17/2014  . Lung nodule-CT 3/15 06/17/2014  . Hepatomegaly-steatosis 08/08/2013  . DM neuropathy, painful ([place]06/06/2011  . Hip pain, chronic 10/19/2011  . Onychomycosis 10/19/2011  . Nephrolithiasis 10/19/2011  . Carpal tunnel syndrome 10/19/2011  . Obesity, Class III, BMI 40-49.9 (morbid obesity) ([place]12/04/2011  . DM type 2, uncontrolled, with neuropathy ([place]12/04/2011  . Gout 04/30/2011  . 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 blood glucose meter [name] and supplies [name] Dispense based on insurance preference. Use bid as directed. (FOR ICD-9 250.00) 1 each 0  . clotrimazole-betamethasone (LOTRISONE) cream Apply 1 application topically 2 (two) times daily. 45 g 1  . Dulaglutide (TRULICITY) 1.5 MGMC/9.4BSOPN Inject 1.5 Units into the skin once a week. 12 pen 0  . gabapentin (NEURONTIN) 400 MG capsule 1 po qam, 2 po q afternon and 2 po qhs 450 capsule 0  . glucose blood test strip Use as instructed 100 each 12  . [START ON 11/29/2017] HYDROcodone-acetaminophen (NORCO) 10-325 MG tablet Take 1 tablet by mouth every 4 (four) hours as needed. 180 tablet 0  . HYDROcodone-acetaminophen (NORCO) 10-325 MG tablet Take 1 tablet by  mouth every 4 (four) hours as needed. 180 tablet 0  . HYDROcodone-acetaminophen (NORCO) 10-325 MG tablet Take 1 tablet by mouth every 4 (four) hours as needed. 180 tablet 0  . Insulin Glargine (LANTUS SOLOSTAR) 100 UNIT/ML Solostar Pen Inject 36 Units into the skin 2 (two) times daily. 10 pen 2  . Lancets MISC 1 Device by Does not apply route 3 (three) times daily. E11.40 100 each 12  . lisinopril (PRINIVIL,ZESTRIL) 10 MG tablet Take 0.5 tablets (5 mg total) by mouth daily. 90 tablet 0  . rosuvastatin (CRESTOR) 20 MG tablet Take 1 tablet (20 mg total) by mouth every evening. 90 tablet 1   No current facility-administered  medications on file prior to visit.     Allergies  Allergen Reactions  . No Known Allergies     Past Medical History:  Diagnosis Date  . Arthritis    gout  . Cataract   . CHF (congestive heart failure) ([place])    patient denies  . Chronic kidney disease   . Clotting disorder [facility])    patient denies  . Diabetes mellitus   . Gout   . History of kidney stones    stents placed prior to and removed  . Hypertension    Social History   Social History Narrative  . Not on file   Social History   Tobacco Use  . Smoking status: Never Smoker  . Smokeless tobacco: Never Used  Substance Use Topics  . Alcohol use: No  . Drug use: No   family history includes COPD in his father; Hyperlipidemia in his mother; Hypertension in his mother; Mental illness in his brother, father, and mother.     Objective:  BP 120/60   Pulse 71   Temp 98.6 F (37 C) (Oral)   Resp 18   Ht 5' 10.87" (1.8 m)   Wt 252 lb 12.8 oz (114.7 kg)   SpO2 97%   BMI 35.39 kg/m  Body mass index is 35.39 kg/m.  Wt Readings from Last 3 Encounters:  11/26/17 252 lb 12.8 oz (114.7 kg)  09/29/17 252 lb 6.4 oz (114.5 kg)  06/30/17 258 lb 12.8 oz (117.4 kg)    Physical Exam  Constitutional: He is oriented to person, place, and time. He appears well-developed and well-nourished.  HENT:  Head: Normocephalic and atraumatic.  Right Ear: External ear normal.  Left Ear: External ear normal.  Eyes: Conjunctivae are normal.  Neck: Normal range of motion.  Cardiovascular: Normal rate, regular rhythm and normal heart sounds.  No murmur heard. Pulmonary/Chest: Effort normal and breath sounds normal. He has no wheezes.  Neurological: He is alert and oriented to person, place, and time.  Skin: Skin is warm and dry.  Psychiatric: Judgment normal.  Vitals reviewed.   Assessment and Plan :  DM type 2, uncontrolled, with neuropathy ([place]) - Plan: Continuous Blood Gluc Receiver (FREESTYLE LIBRE 14 DAY READER) DEVI,  Continuous Blood Gluc Sensor (FREESTYLE LIBRE 14 DAY SENSOR) MISC, CMP14+EGFR -we will start a continuous monitor especially with the addition of prednisone so he can monitor closely his glucose if his glucoses are running over 250 he will contact me for addition and instruction on insulin.  Elevated uric acid in blood - Plan: Uric Acid -he has been on allopurinol 100 mg for several months without his uric acid level checked we will check this again today and then we will increase his dose accordingly.  Chronic left-sided low back pain without sciatica - Plan: cyclobenzaprine (  FLEXERIL) 10 MG tablet -patient is having a lot of muscle spasms and muscle cramps he relates to gabapentin which may be true but he is also having significant pain with laying down at night which could be more related to muscles.  We will add Flexeril at night.  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  He will be very close to monitor his glucose during this time.  Patient verbalized to me that they understand the following: diagnosis, what is being done for them, what to expect and what should be done at home.  Their questions have been answered.  See after visit summary for patient specific instructions.  Windell Hummingbird PA-C  Primary Care at [facility] 11/26/2017 6:34 PM  Please note: Portions of this report may have been transcribed using dragon voice recognition software. Every effort was made to ensure accuracy; however, inadvertent computerized transcription errors may be present.

## 2017-11-26 NOTE — Patient Instructions (Addendum)
Call me with your allopurinol dose that you have been taking.  Muscle relaxers to help with muscle cramps at night  Prednisone to help with back pain - check your sugars    IF you received an x-ray today, you will [facility] an [facility]. Please contact [facility] at [phone] with questions or concerns regarding your invoice.   IF you received labwork today, you will receive an invoice from [facility]. Please contact [facility] at [phone] with questions or concerns regarding your invoice.   Our billing staff will not be able to assist you with questions regarding bills from these companies.  You will be contacted with the lab results as soon as they are available. The fastest way to get your results is to activate your My Chart account. Instructions are located on the last page of this paperwork. If you have not heard from us regarding the results in 2 weeks, please contact this office.

## 2017-11-27 ENCOUNTER — Telehealth: Payer: Self-pay | Admitting: Physician Assistant

## 2017-11-27 LAB — CMP14+EGFR
ALT: 12 IU/L (ref 0–44)
AST: 15 IU/L (ref 0–40)
Albumin/Globulin Ratio: 1.5 (ref 1.2–2.2)
Albumin: 4.3 g/dL (ref 3.5–5.5)
Alkaline Phosphatase: 81 IU/L (ref 39–117)
BUN/Creatinine Ratio: 18 (ref 9–20)
BUN: 23 mg/dL (ref 6–24)
Bilirubin Total: 0.2 mg/dL (ref 0.0–1.2)
CALCIUM: 9.4 mg/dL (ref 8.7–10.2)
CO2: 23 mmol/L (ref 20–29)
CREATININE: 1.3 mg/dL — AB (ref 0.76–1.27)
Chloride: 108 mmol/L — ABNORMAL HIGH (ref 96–106)
GFR, EST AFRICAN AMERICAN: 72 mL/min/{1.73_m2} (ref >59)
GFR, EST NON AFRICAN AMERICAN: 62 mL/min/{1.73_m2} (ref >59)
Globulin, Total: 2.8 g/dL (ref 1.5–4.5)
Glucose: 130 mg/dL — ABNORMAL HIGH (ref 65–99)
Potassium: 4.8 mmol/L (ref 3.5–5.2)
Sodium: 144 mmol/L (ref 134–144)
TOTAL PROTEIN: 7.1 g/dL (ref 6.0–8.5)

## 2017-11-27 LAB — URIC ACID: Uric Acid: 7.5 mg/dL (ref 3.7–8.6)

## 2017-11-27 NOTE — Telephone Encounter (Signed)
Copied from CRM [id number]. Topic: Quick Communication - See Telephone Encounter >> Nov 27, 2017 10:00 AM Tamela OddiMartin, [place], NT wrote: CRM for notification. See Telephone encounter for: 11/27/17. Ally [facility], states that the RX for the predniSONE (DELTASONE) 20 MG tablet quantity is not matching up to the directions on the RX. Please call to clarify CB# [phone]

## 2017-11-27 NOTE — Telephone Encounter (Signed)
Spoke to pharmacist, confusion with the number of tablets to give based on the sig on Rx. After doing the math 18 would be the correct number of tablets to give instead of the 16.

## 2017-11-27 NOTE — Telephone Encounter (Signed)
See pharmacy request below 

## 2017-12-01 NOTE — Telephone Encounter (Signed)
I must have miscounted.  Please get pharmacy to correct - thanks

## 2018-01-01 ENCOUNTER — Telehealth: Payer: Self-pay | Admitting: Physician Assistant

## 2018-01-01 ENCOUNTER — Ambulatory Visit (INDEPENDENT_AMBULATORY_CARE_PROVIDER_SITE_OTHER): Payer: PPO | Admitting: Physician Assistant

## 2018-01-01 ENCOUNTER — Encounter: Payer: Self-pay | Admitting: Physician Assistant

## 2018-01-01 ENCOUNTER — Other Ambulatory Visit: Payer: Self-pay

## 2018-01-01 VITALS — BP 120/62 | HR 80 | Temp 98.7°F | Resp 18 | Ht 70.87 in | Wt 257.2 lb

## 2018-01-01 DIAGNOSIS — I10 Essential (primary) hypertension: Secondary | ICD-10-CM

## 2018-01-01 DIAGNOSIS — G894 Chronic pain syndrome: Secondary | ICD-10-CM

## 2018-01-01 DIAGNOSIS — E1165 Type 2 diabetes mellitus with hyperglycemia: Secondary | ICD-10-CM | POA: Diagnosis not present

## 2018-01-01 DIAGNOSIS — E790 Hyperuricemia without signs of inflammatory arthritis and tophaceous disease: Secondary | ICD-10-CM

## 2018-01-01 DIAGNOSIS — M545 Low back pain: Secondary | ICD-10-CM

## 2018-01-01 DIAGNOSIS — IMO0002 Reserved for concepts with insufficient information to code with codable children: Secondary | ICD-10-CM

## 2018-01-01 DIAGNOSIS — E1140 Type 2 diabetes mellitus with diabetic neuropathy, unspecified: Secondary | ICD-10-CM | POA: Diagnosis not present

## 2018-01-01 DIAGNOSIS — E7800 Pure hypercholesterolemia, unspecified: Secondary | ICD-10-CM | POA: Diagnosis not present

## 2018-01-01 DIAGNOSIS — E1142 Type 2 diabetes mellitus with diabetic polyneuropathy: Secondary | ICD-10-CM

## 2018-01-01 DIAGNOSIS — G8929 Other chronic pain: Secondary | ICD-10-CM | POA: Diagnosis not present

## 2018-01-01 MED ORDER — HYDROCODONE-ACETAMINOPHEN 10-325 MG PO TABS: 1.0000 | ORAL_TABLET | ORAL | 0 refills | Status: DC | PRN

## 2018-01-01 MED ORDER — TIZANIDINE HCL 4 MG PO CAPS: 4.0000 mg | ORAL_CAPSULE | Freq: Three times a day (TID) | ORAL | 1 refills | Status: DC

## 2018-01-01 MED ORDER — ROSUVASTATIN CALCIUM 20 MG PO TABS: 20.0000 mg | ORAL_TABLET | Freq: Every evening | ORAL | 1 refills | Status: DC

## 2018-01-01 MED ORDER — HYDROCODONE-ACETAMINOPHEN 10-325 MG PO TABS: 1.0000 | ORAL_TABLET | ORAL | 0 refills | Status: AC | PRN

## 2018-01-01 MED ORDER — LISINOPRIL 10 MG PO TABS: 5.0000 mg | ORAL_TABLET | Freq: Every day | ORAL | 0 refills | Status: DC

## 2018-01-01 MED ORDER — GABAPENTIN 400 MG PO CAPS: ORAL_CAPSULE | ORAL | 0 refills | Status: DC

## 2018-01-01 MED ORDER — FEBUXOSTAT 40 MG PO TABS: 40.0000 mg | ORAL_TABLET | Freq: Every day | ORAL | 0 refills | Status: DC

## 2018-01-01 MED ORDER — DULAGLUTIDE 1.5 MG/0.5ML ~~LOC~~ SOAJ: 1.5000 [IU] | SUBCUTANEOUS | 0 refills | Status: AC

## 2018-01-01 MED ORDER — INSULIN GLARGINE 100 UNIT/ML SOLOSTAR PEN: 36.0000 [IU] | PEN_INJECTOR | Freq: Two times a day (BID) | SUBCUTANEOUS | 2 refills | Status: DC

## 2018-01-01 NOTE — Patient Instructions (Addendum)
   [facility] - [address], [place], [place] [address] Phone: [phone]   If you have lab work done today you will be contacted with your lab results within the next 2 weeks.  If you have not heard from us then please contact us. The fastest way to get your results is to register for My Chart.   IF you received an x-ray today, you will receive an invoice from [facility]. Please contact [facility] at [phone] with questions or concerns regarding your invoice.   IF you received labwork today, you will receive an invoice from [facility]. Please contact [facility] at [phone] with questions or concerns regarding your invoice.   Our billing staff will not be able to assist you with questions regarding bills from these companies.  You will be contacted with the lab results as soon as they are available. The fastest way to get your results is to activate your My Chart account. Instructions are located on the last page of this paperwork. If you have not heard from us regarding the results in 2 weeks, please contact this office.     

## 2018-01-01 NOTE — Telephone Encounter (Signed)
Please see note below. 

## 2018-01-01 NOTE — Progress Notes (Signed)
[name] Snow  MRN: [id number] DOB: August 29, 1965  PCP: Mancel Bale, PA-C  Chief Complaint  Patient presents with  . Diabetes    follow up     Subjective:  Pt presents to clinic for medication refills.  Recent stress in his life 7/17 divorce is final Mom passed away 01-14-23  Allopurinol - seems to make his gout worse in the past when he has used it- felt bad -- every time he eats protein he gets increased pain in his back and he is interested in may be going on something to prevent gout by decreasing his uric acid  Back pain -- the flexeril he had to take 4 pills to get relief -- he would like to try another one  Otherwise things are good.   History is obtained by patient.  Review of Systems  Constitutional: Negative for chills and fever.  Musculoskeletal: Positive for back pain.  Psychiatric/Behavioral: Positive for dysphoric mood (Due to her recent divorce more so than mother's death, sad about not having a relationship with his son since the separation).    Patient Active Problem List   Diagnosis Date Noted  . Open wound of right foot 01/28/2017  . History of osteomyelitis 08/23/2016  . SIRS (systemic inflammatory response syndrome) ([place]) 08/22/2016  . HTN (hypertension) 07/22/2016  . Chronic pain syndrome 01/17/2016  . Pain in joint, shoulder region 06/29/2015  . Hyperlipidemia 10/17/2014  . Arteriosclerosis 06/17/2014  . Lung nodule-CT 3/15 06/17/2014  . Hepatomegaly-steatosis 08/08/2013  . DM neuropathy, painful ([place]) 10/19/2011  . Hip pain, chronic 10/19/2011  . Onychomycosis 10/19/2011  . Nephrolithiasis 10/19/2011  . Carpal tunnel syndrome 10/19/2011  . Obesity, Class III, BMI 40-49.9 (morbid obesity) ([place]) 04/30/2011  . DM type 2, uncontrolled, with neuropathy ([place]) 04/30/2011  . Gout 04/30/2011  . 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 blood  glucose meter kit and supplies KIT Dispense based on insurance preference. Use bid as directed. (FOR ICD-9 250.00) 1 each 0  . clotrimazole-betamethasone (LOTRISONE) cream Apply 1 application topically 2 (two) times daily. 45 g 1  . Continuous Blood Gluc Sensor (FREESTYLE LIBRE 14 DAY SENSOR) MISC 1 Device by Does not apply route every 14 (fourteen) days. 2 each 6  . glucose blood test strip Use as instructed 100 each 12  . Lancets MISC 1 Device by Does not apply route 3 (three) times daily. E11.40 100 each 12   No current facility-administered medications on file prior to visit.     Allergies  Allergen Reactions  . No Known Allergies     Past Medical History:  Diagnosis Date  . Arthritis    gout  . Cataract   . CHF (congestive heart failure) ([place])    patient denies  . Chronic kidney disease   . Clotting disorder [facility])    patient denies  . Diabetes mellitus   . Gout   . History of kidney stones    stents placed prior to and removed  . Hypertension    Social History   Social History Narrative  . Not on file   Social History   Tobacco Use  . Smoking status: Never Smoker  . Smokeless tobacco: Never Used  Substance Use Topics  . Alcohol use: No  . Drug use: No   family history includes COPD in his father; Hyperlipidemia in his mother; Hypertension in his mother; Mental illness  in his brother, father, and mother.     Objective:  BP 120/62   Pulse 80   Temp 98.7 F (37.1 C) (Oral)   Resp 18   Ht 5' 10.87" (1.8 m)   Wt 257 lb 3.2 oz (116.7 kg)   SpO2 97%   BMI 36.00 kg/m  Body mass index is 36 kg/m.  Wt Readings from Last 3 Encounters:  01/01/18 257 lb 3.2 oz (116.7 kg)  11/26/17 252 lb 12.8 oz (114.7 kg)  09/29/17 252 lb 6.4 oz (114.5 kg)    Physical Exam  Constitutional: He is oriented to person, place, and time. He appears well-developed and well-nourished.  HENT:  Head: Normocephalic and atraumatic.  Right Ear: External ear normal.  Left Ear:  External ear normal.  Eyes: Conjunctivae are normal.  Neck: Normal range of motion.  Cardiovascular: Normal rate, regular rhythm and normal heart sounds.  No murmur heard. Pulmonary/Chest: Effort normal and breath sounds normal. He has no wheezes.  Neurological: He is alert and oriented to person, place, and time.  Skin: Skin is warm and dry.  Psychiatric: Judgment normal.  Vitals reviewed.   Assessment and Plan :  DM type 2, uncontrolled, with neuropathy ([place]) - Plan: CMP14+EGFR, Hemoglobin A1c, Dulaglutide (TRULICITY) 1.5 UJ/8.1XB SOPN, Insulin Glargine (LANTUS SOLOSTAR) 100 UNIT/ML Solostar Pen -check labs continue medication will adjust if needed  Elevated uric acid in blood - Plan: Uric Acid, febuxostat (ULORIC) 40 MG tablet -patient failed allopurinol will try Uloric to decrease uric acid which at last check was 7.5  Chronic pain syndrome - Plan: HYDROcodone-acetaminophen (NORCO) 10-325 MG tablet, HYDROcodone-acetaminophen (NORCO) 10-325 MG tablet, HYDROcodone-acetaminophen (NORCO) 10-325 MG tablet, DISCONTINUED: HYDROcodone-acetaminophen (NORCO) 10-325 MG tablet, DISCONTINUED: HYDROcodone-acetaminophen (NORCO) 10-325 MG tablet -3 months prescription sent to pharmacy  Hypertension, unspecified type - Plan: lisinopril (PRINIVIL,ZESTRIL) 10 MG tablet -well-controlled on medication  Diabetic polyneuropathy associated with type 2 diabetes mellitus ([place]) - Plan: gabapentin (NEURONTIN) 400 MG capsule -still having significant pain with diabetic neuropathy the patient cannot tolerate higher doses of gabapentin due to muscle cramping  Pure hypercholesterolemia - Plan: rosuvastatin (CRESTOR) 20 MG tablet  Chronic bilateral low back pain without sciatica - Plan: tiZANidine (ZANAFLEX) 4 MG capsule -trial of Zanaflex to see if that will help control his back pain, can increase the dose if needed  Patient verbalized to me that they understand the following: diagnosis, what is being done for them,  what to expect and what should be done at home.  Their questions have been answered.  See after visit summary for patient specific instructions.  Windell Hummingbird PA-C  [facility] 01/01/2018 6:03 PM  Please note: Portions of this report may have been transcribed using dragon voice recognition software. Every effort was made to ensure accuracy; however, inadvertent computerized transcription errors may be present.

## 2018-01-01 NOTE — Telephone Encounter (Signed)
Copied from CRM [id number]. Topic: Quick Communication - Rx Refill/Question >> Jan 01, 2018  4:28 PM Maia Pettiesrtiz, Kristie S wrote: Medication: tiZANidine (ZANAFLEX) 4 MG capsule  Requesting to change to 4mg  tablets as they do not stock the capsule

## 2018-01-01 NOTE — Telephone Encounter (Signed)
I fixed it by sending new Rx to the pharmcy

## 2018-01-01 NOTE — Telephone Encounter (Signed)
Copied from CRM [id number]. Topic: Quick Communication - See Telephone Encounter >> Jan 01, 2018  3:44 PM Arlyss Gandyichardson, Markos Theil N, NT wrote: CRM for notification. See Telephone encounter for: 01/01/18. [facility] refill for the HYDROcodone-acetaminophen (NORCO) 10-325 MG tablet with dates for when 2 of them can be filled. They all say to fill today and they are unable to do that.

## 2018-01-02 LAB — HEMOGLOBIN A1C
ESTIMATED AVERAGE GLUCOSE: 174 mg/dL
HEMOGLOBIN A1C: 7.7 % — AB (ref 4.8–5.6)

## 2018-01-02 LAB — CMP14+EGFR
ALT: 15 IU/L (ref 0–44)
AST: 14 IU/L (ref 0–40)
Albumin/Globulin Ratio: 1.4 (ref 1.2–2.2)
Albumin: 4.1 g/dL (ref 3.5–5.5)
Alkaline Phosphatase: 88 IU/L (ref 39–117)
BILIRUBIN TOTAL: 0.4 mg/dL (ref 0.0–1.2)
BUN/Creatinine Ratio: 19 (ref 9–20)
BUN: 24 mg/dL (ref 6–24)
CHLORIDE: 103 mmol/L (ref 96–106)
CO2: 24 mmol/L (ref 20–29)
Calcium: 8.9 mg/dL (ref 8.7–10.2)
Creatinine, Ser: 1.24 mg/dL (ref 0.76–1.27)
GFR calc non Af Amer: 65 mL/min/{1.73_m2} (ref >59)
GFR, EST AFRICAN AMERICAN: 76 mL/min/{1.73_m2} (ref >59)
GLUCOSE: 177 mg/dL — AB (ref 65–99)
Globulin, Total: 2.9 g/dL (ref 1.5–4.5)
Potassium: 5.1 mmol/L (ref 3.5–5.2)
Sodium: 140 mmol/L (ref 134–144)
TOTAL PROTEIN: 7 g/dL (ref 6.0–8.5)

## 2018-01-02 LAB — URIC ACID: URIC ACID: 9.5 mg/dL — AB (ref 3.7–8.6)

## 2018-01-04 MED ORDER — TIZANIDINE HCL 4 MG PO TABS: 4.0000 mg | ORAL_TABLET | Freq: Four times a day (QID) | ORAL | 0 refills | Status: DC | PRN

## 2018-01-04 NOTE — Telephone Encounter (Signed)
Changed.

## 2018-01-04 NOTE — Telephone Encounter (Signed)
Pharmacy needs clarification on this medication. It was changed from 90 3 times daily to tablets,  120 4 times daily.

## 2018-01-05 NOTE — Telephone Encounter (Signed)
Pharmacy called to get clarity on the quantity and directions; contact back

## 2018-01-05 NOTE — Telephone Encounter (Signed)
Called pharmacy to clarify Rx, she verbalized understanding.

## 2018-01-22 ENCOUNTER — Ambulatory Visit: Payer: PPO | Admitting: Physician Assistant

## 2018-01-28 ENCOUNTER — Other Ambulatory Visit: Payer: Self-pay

## 2018-01-28 ENCOUNTER — Encounter: Payer: Self-pay | Admitting: Physician Assistant

## 2018-01-28 ENCOUNTER — Ambulatory Visit (INDEPENDENT_AMBULATORY_CARE_PROVIDER_SITE_OTHER): Payer: PPO | Admitting: Physician Assistant

## 2018-01-28 VITALS — BP 138/78 | HR 64 | Temp 98.0°F | Resp 18 | Ht 70.87 in | Wt 252.6 lb

## 2018-01-28 DIAGNOSIS — E1140 Type 2 diabetes mellitus with diabetic neuropathy, unspecified: Secondary | ICD-10-CM | POA: Diagnosis not present

## 2018-01-28 DIAGNOSIS — Z23 Encounter for immunization: Secondary | ICD-10-CM

## 2018-01-28 DIAGNOSIS — IMO0002 Reserved for concepts with insufficient information to code with codable children: Secondary | ICD-10-CM

## 2018-01-28 DIAGNOSIS — E790 Hyperuricemia without signs of inflammatory arthritis and tophaceous disease: Secondary | ICD-10-CM | POA: Diagnosis not present

## 2018-01-28 DIAGNOSIS — E1165 Type 2 diabetes mellitus with hyperglycemia: Secondary | ICD-10-CM

## 2018-01-28 LAB — GLUCOSE, POCT (MANUAL RESULT ENTRY): POC GLUCOSE: 131 mg/dL — AB (ref 70–99)

## 2018-01-28 NOTE — Patient Instructions (Addendum)
° ° ° °  If you have lab work done today you will be contacted with your lab results within the next 2 weeks.  If you have not heard from us then please contact us. The fastest way to get your results is to register for My Chart. ° ° °IF you received an x-ray today, you will receive an invoice from [facility]. Please contact [facility] at [phone] with questions or concerns regarding your invoice.  ° °IF you received labwork today, you will receive an invoice from [facility]. Please contact [facility] at [phone] with questions or concerns regarding your invoice.  ° °Our billing staff will not be able to assist you with questions regarding bills from these companies. ° °You will be contacted with the lab results as soon as they are available. The fastest way to get your results is to activate your My Chart account. Instructions are located on the last page of this paperwork. If you have not heard from us regarding the results in 2 weeks, please contact this office. °  ° ° ° °

## 2018-01-28 NOTE — Progress Notes (Signed)
[name] Snow  MRN: [id number] DOB: [birth year]  PCP: Mancel Bale, PA-C  Chief Complaint  Patient presents with  . Diabetes    follow up     Subjective:  Pt presents to clinic for generalized follow-up.  He has been taking his medications regularly.  He is still having a difficult time with his recent divorce.  He started the Uloric but blames a gout attack on starting this.  He has continued it.   History is obtained by patient.  Review of Systems  Constitutional: Negative for chills and fever.  Eyes: Negative for visual disturbance.  Respiratory: Negative for cough and shortness of breath.   Cardiovascular: Negative for chest pain, palpitations and leg swelling.  Musculoskeletal: Positive for back pain (Chronic had recent gout attack that was treated with prednisone.).  Neurological: Negative for dizziness, light-headedness, numbness and headaches.    Patient Active Problem List   Diagnosis Date Noted  . Open wound of right foot 01/28/2017  . History of osteomyelitis 08/23/2016  . SIRS (systemic inflammatory response syndrome) ([place]) 08/22/2016  . HTN (hypertension) 07/22/2016  . Chronic pain syndrome 01/17/2016  . Pain in joint, shoulder region 06/29/2015  . Hyperlipidemia 10/17/2014  . Arteriosclerosis 06/17/2014  . Lung nodule-CT 3/15 06/17/2014  . Hepatomegaly-steatosis 08/08/2013  . DM neuropathy, painful ([place]) 10/19/2011  . Hip pain, chronic 10/19/2011  . Onychomycosis 10/19/2011  . Nephrolithiasis 10/19/2011  . Carpal tunnel syndrome 10/19/2011  . Obesity, Class III, BMI 40-49.9 (morbid obesity) ([place]) 04/30/2011  . DM type 2, uncontrolled, with neuropathy ([place]) 04/30/2011  . Gout 04/30/2011  . 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 blood glucose meter kit and supplies KIT Dispense based on insurance preference. Use bid as directed. (FOR ICD-9 250.00) 1  each 0  . clotrimazole-betamethasone (LOTRISONE) cream Apply 1 application topically 2 (two) times daily. 45 g 1  . Continuous Blood Gluc Sensor (FREESTYLE LIBRE 14 DAY SENSOR) MISC 1 Device by Does not apply route every 14 (fourteen) days. 2 each 6  . Dulaglutide (TRULICITY) 1.5 FW/2.6VZ SOPN Inject 1.5 Units into the skin once a week. 12 pen 0  . febuxostat (ULORIC) 40 MG tablet Take 1 tablet (40 mg total) by mouth daily. Pt failed allopurinol 30 tablet 0  . gabapentin (NEURONTIN) 400 MG capsule 1 po qam, 2 po q afternon and 2 po qhs 450 capsule 0  . glucose blood test strip Use as instructed 100 each 12  . HYDROcodone-acetaminophen (NORCO) 10-325 MG tablet Take 1 tablet by mouth every 4 (four) hours as needed. 180 tablet 0  . [START ON 03/03/2018] HYDROcodone-acetaminophen (NORCO) 10-325 MG tablet Take 1 tablet by mouth every 4 (four) hours as needed. 180 tablet 0  . HYDROcodone-acetaminophen (NORCO) 10-325 MG tablet Take 1 tablet by mouth every 4 (four) hours as needed. 180 tablet 0  . Insulin Glargine (LANTUS SOLOSTAR) 100 UNIT/ML Solostar Pen Inject 36 Units into the skin 2 (two) times daily. 10 pen 2  . Lancets MISC 1 Device by Does not apply route 3 (three) times daily. E11.40 100 each 12  . lisinopril (PRINIVIL,ZESTRIL) 10 MG tablet Take 0.5 tablets (5 mg total) by mouth daily. 90 tablet 0  . rosuvastatin (CRESTOR) 20 MG tablet Take 1 tablet (20 mg total) by mouth every evening. 90 tablet 1  . tiZANidine (ZANAFLEX) 4 MG tablet Take 1 tablet (4 mg total) by mouth  every 6 (six) hours as needed for muscle spasms. 120 tablet 0   No current facility-administered medications on file prior to visit.     Allergies  Allergen Reactions  . No Known Allergies     Past Medical History:  Diagnosis Date  . Arthritis    gout  . Cataract   . CHF (congestive heart failure) ([place])    patient denies  . Chronic kidney disease   . Clotting disorder [facility])    patient denies  . Diabetes mellitus     . Gout   . History of kidney stones    stents placed prior to and removed  . Hypertension    Social History   Social History Narrative  . Not on file   Social History   Tobacco Use  . Smoking status: Never Smoker  . Smokeless tobacco: Never Used  Substance Use Topics  . Alcohol use: No  . Drug use: No   family history includes COPD in his father; Hyperlipidemia in his mother; Hypertension in his mother; Mental illness in his brother, father, and mother.     Objective:  BP 138/78   Pulse 64   Temp 98 F (36.7 C) (Oral)   Resp 18   Ht 5' 10.87" (1.8 m)   Wt 252 lb 9.6 oz (114.6 kg)   SpO2 97%   BMI 35.36 kg/m   Body mass index is 35.36 kg/m.  Wt Readings from Last 3 Encounters:  01/28/18 252 lb 9.6 oz (114.6 kg)  01/01/18 257 lb 3.2 oz (116.7 kg)  11/26/17 252 lb 12.8 oz (114.7 kg)    Physical Exam  Constitutional: He is oriented to person, place, and time. He appears well-developed and well-nourished.  HENT:  Head: Normocephalic and atraumatic.  Right Ear: External ear normal.  Left Ear: External ear normal.  Eyes: Conjunctivae are normal.  Neck: Normal range of motion.  Cardiovascular: Normal rate, regular rhythm and normal heart sounds.  No murmur heard. Pulmonary/Chest: Effort normal and breath sounds normal. He has no wheezes.  Neurological: He is alert and oriented to person, place, and time.  Skin: Skin is warm and dry.  Psychiatric: Judgment normal.  Vitals reviewed.  Results for orders placed or performed in visit on 01/28/18  Uric Acid  Result Value Ref Range   Uric Acid 6.2 3.7 - 8.6 mg/dL  POCT glucose (manual entry)  Result Value Ref Range   POC Glucose 131 (A) 70 - 99 mg/dl    Assessment and Plan :  Elevated uric acid in blood - Plan: Uric Acid  Flu vaccine need - Plan: Flu Vaccine QUAD 36+ mos IM  DM type 2, uncontrolled, with neuropathy ([place]) - Plan: POCT glucose (manual entry)   Did point-of-care glucose to see if patient's  monitor is correct and it is.  He is using the [place] which does not cause him to have to prick his finger so is keeping closer check though does not document his eating in regards to these checks.  Check his uric acid continue Uloric.  Patient verbalized to me that they understand the following: diagnosis, what is being done for them, what to expect and what should be done at home.  Their questions have been answered.  See after visit summary for patient specific instructions.  Windell Hummingbird PA-C  [facility] 02/03/2018 8:01 AM  Please note: Portions of this report may have been transcribed using dragon voice recognition software. Every effort  was made to ensure accuracy; however, inadvertent computerized transcription errors may be present.

## 2018-01-29 LAB — URIC ACID: Uric Acid: 6.2 mg/dL (ref 3.7–8.6)

## 2018-02-03 ENCOUNTER — Telehealth: Payer: Self-pay

## 2018-02-03 DIAGNOSIS — G894 Chronic pain syndrome: Secondary | ICD-10-CM

## 2018-02-03 DIAGNOSIS — E790 Hyperuricemia without signs of inflammatory arthritis and tophaceous disease: Secondary | ICD-10-CM

## 2018-02-03 MED ORDER — FEBUXOSTAT 40 MG PO TABS: 40.0000 mg | ORAL_TABLET | Freq: Every day | ORAL | 1 refills | Status: DC

## 2018-02-03 MED ORDER — HYDROCODONE-ACETAMINOPHEN 10-325 MG PO TABS: 1.0000 | ORAL_TABLET | ORAL | 0 refills | Status: DC | PRN

## 2018-02-03 MED ORDER — TIZANIDINE HCL 4 MG PO TABS: 4.0000 mg | ORAL_TABLET | Freq: Four times a day (QID) | ORAL | 1 refills | Status: AC | PRN

## 2018-02-03 NOTE — Telephone Encounter (Signed)
Lab results are on his labs.  I sent to lab pool and his mychart.  I sent in another month of hydrocodone, muscle relaxers and his uloric which he needs to continue on for his gout

## 2018-02-03 NOTE — Telephone Encounter (Signed)
Called [name] Snow regarding lab results and he stated he needed a refill of his hydrocodone and muscle relaxer filled till November due to insurance issues.

## 2018-02-04 NOTE — Telephone Encounter (Signed)
Attempted to call patient. Unable to leave a call back message. His mailbox is full.

## 2018-02-04 NOTE — Telephone Encounter (Signed)
Patient received lab message in result notes. Attempted to call to relay message below from [name], PA-C, no VM left due to no mailbox set up per recording.

## 2018-03-17 ENCOUNTER — Other Ambulatory Visit: Payer: Self-pay | Admitting: Physician Assistant

## 2018-03-17 DIAGNOSIS — G894 Chronic pain syndrome: Secondary | ICD-10-CM

## 2018-03-17 NOTE — Telephone Encounter (Signed)
Copied from CRM [id number]. Topic: Quick Communication - Rx Refill/Question >> Mar 17, 2018  1:22 PM Baldo Daub L wrote: Medication:  HYDROcodone-acetaminophen (NORCO) 10-325 MG tablet  Has the patient contacted their pharmacy? No - controlled substance.  He would like a 90 day supply called in (Agent: If no, request that the patient contact the pharmacy for the refill.) (Agent: If yes, when and what did the pharmacy advise?)  Preferred Pharmacy (with phone number or street name): [address] [facility] - [place], [place] - [address] [phone] (Phone) [phone] (Fax)  Agent: Please be advised that RX refills may take up to 3 business days. We ask that you follow-up with your pharmacy.

## 2018-03-17 NOTE — Telephone Encounter (Signed)
Requested medication (s) are due for refill today - no  Requested medication (s) are on the active medication list - yes  Future visit scheduled -no  Last refill: 03/03/18  Notes to clinic: Patient is requesting 90 day supply of medication. He still has his November Rx to fill.  Requested Prescriptions  Pending Prescriptions Disp Refills   HYDROcodone-acetaminophen (NORCO) 10-325 MG tablet 180 tablet 0    Sig: Take 1 tablet by mouth every 4 (four) hours as needed.     Not Delegated - Analgesics:  Opioid Agonist Combinations Failed - 03/17/2018  1:31 PM      Failed - This refill cannot be delegated      Failed - Urine Drug Screen completed in last 360 days.      Passed - Valid encounter within last 6 months    Recent Outpatient Visits          1 month ago Elevated uric acid in blood   [facility], Dema Severin, PA-C   2 months ago DM type 2, uncontrolled, with neuropathy [facility])   [facility], Dema Severin, PA-C   3 months ago DM type 2, uncontrolled, with neuropathy [facility])   [facility], Dema Severin, PA-C   5 months ago Elbow swelling, right   [facility], Dema Severin, PA-C   8 months ago Essential hypertension   [facility], Dema Severin, PA-C              Requested Prescriptions  Pending Prescriptions Disp Refills   HYDROcodone-acetaminophen (NORCO) 10-325 MG tablet 180 tablet 0    Sig: Take 1 tablet by mouth every 4 (four) hours as needed.     Not Delegated - Analgesics:  Opioid Agonist Combinations Failed - 03/17/2018  1:31 PM      Failed - This refill cannot be delegated      Failed - Urine Drug Screen completed in last 360 days.      Passed - Valid encounter within last 6 months    Recent Outpatient Visits          1 month ago Elevated uric acid in blood   [facility], Dema Severin, PA-C   2 months ago DM type 2, uncontrolled, with neuropathy [facility])   [facility],  Dema Severin, PA-C   3 months ago DM type 2, uncontrolled, with neuropathy [facility])   [facility], Dema Severin, PA-C   5 months ago Elbow swelling, right   [facility], Dema Severin, PA-C   8 months ago Essential hypertension   [facility], Dema Severin, PA-C

## 2018-03-18 NOTE — Telephone Encounter (Signed)
Pt message sent to Dr. Narda Amber Weber pt. Re: refill Hydrocodone

## 2018-03-18 NOTE — Telephone Encounter (Signed)
Last filled on 03/03/18 per pmp review Needs appt to establish care as his PCP is no longer with this practice, or is he planning on seeing her at her new office? thanks

## 2018-03-22 NOTE — Telephone Encounter (Signed)
All orders have to be signed before forwarding this message to scheduling pool so that we can close the encounter.

## 2018-04-07 IMAGING — DX DG FOOT COMPLETE 3+V*L*
3 series · 3 of 3 positions shown · non-contrast
Comparison: none

CLINICAL DATA: Open wound right foot.  Amputation August 2016

EXAM:
LEFT FOOT - COMPLETE 3+ VIEW

[foot ap]
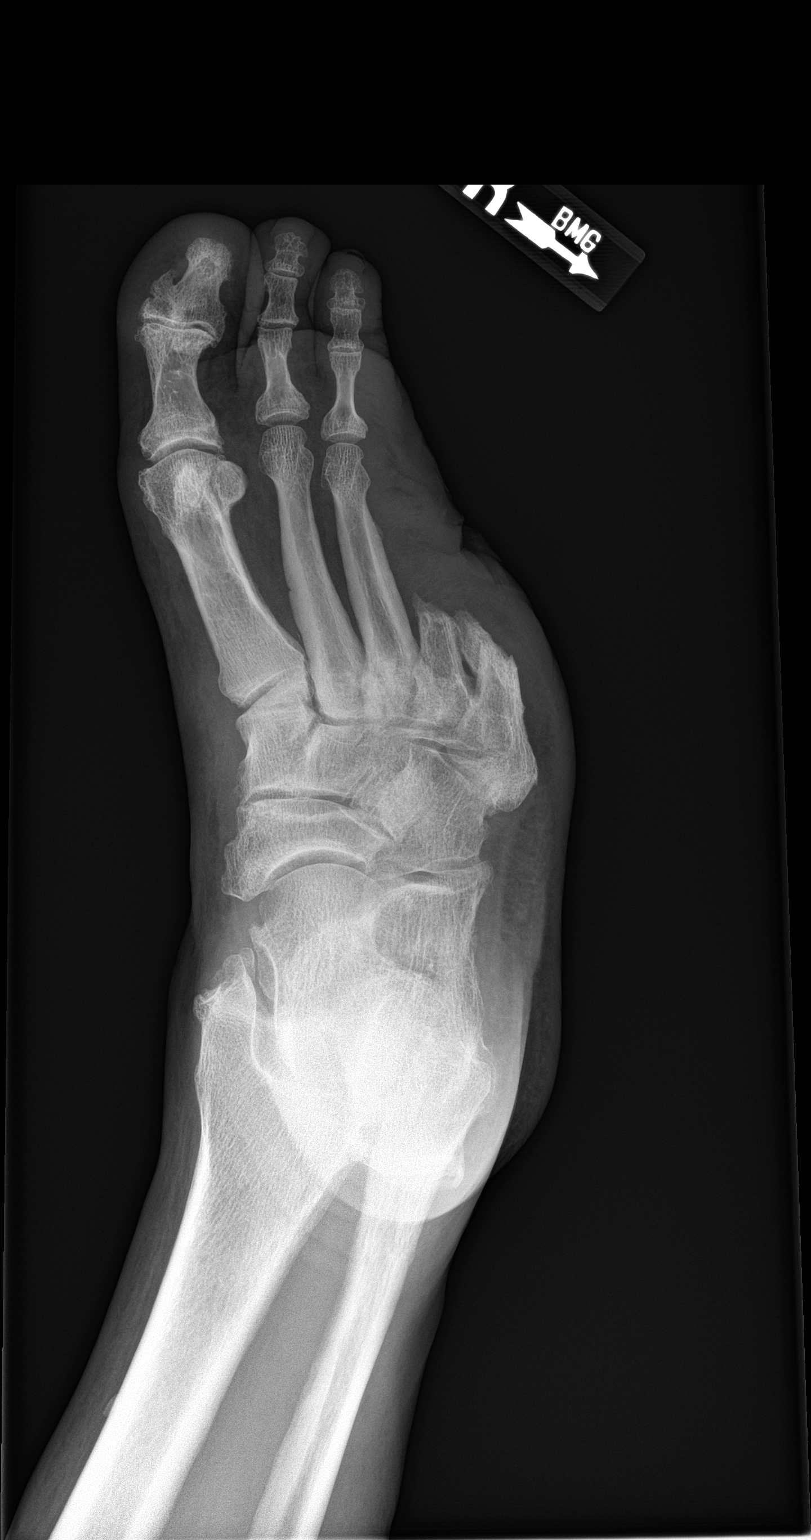

[foot obl]
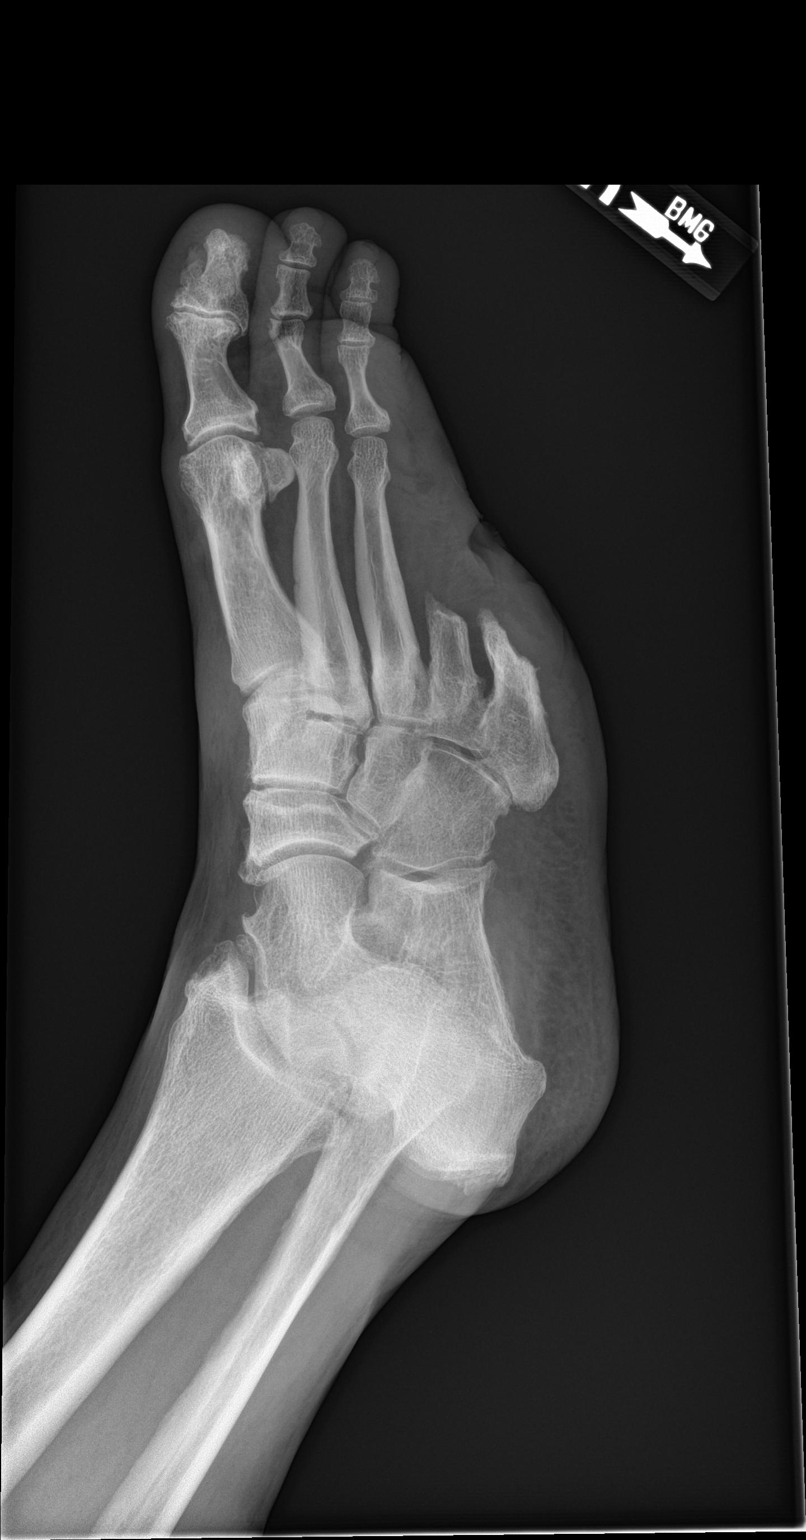

[foot lat]
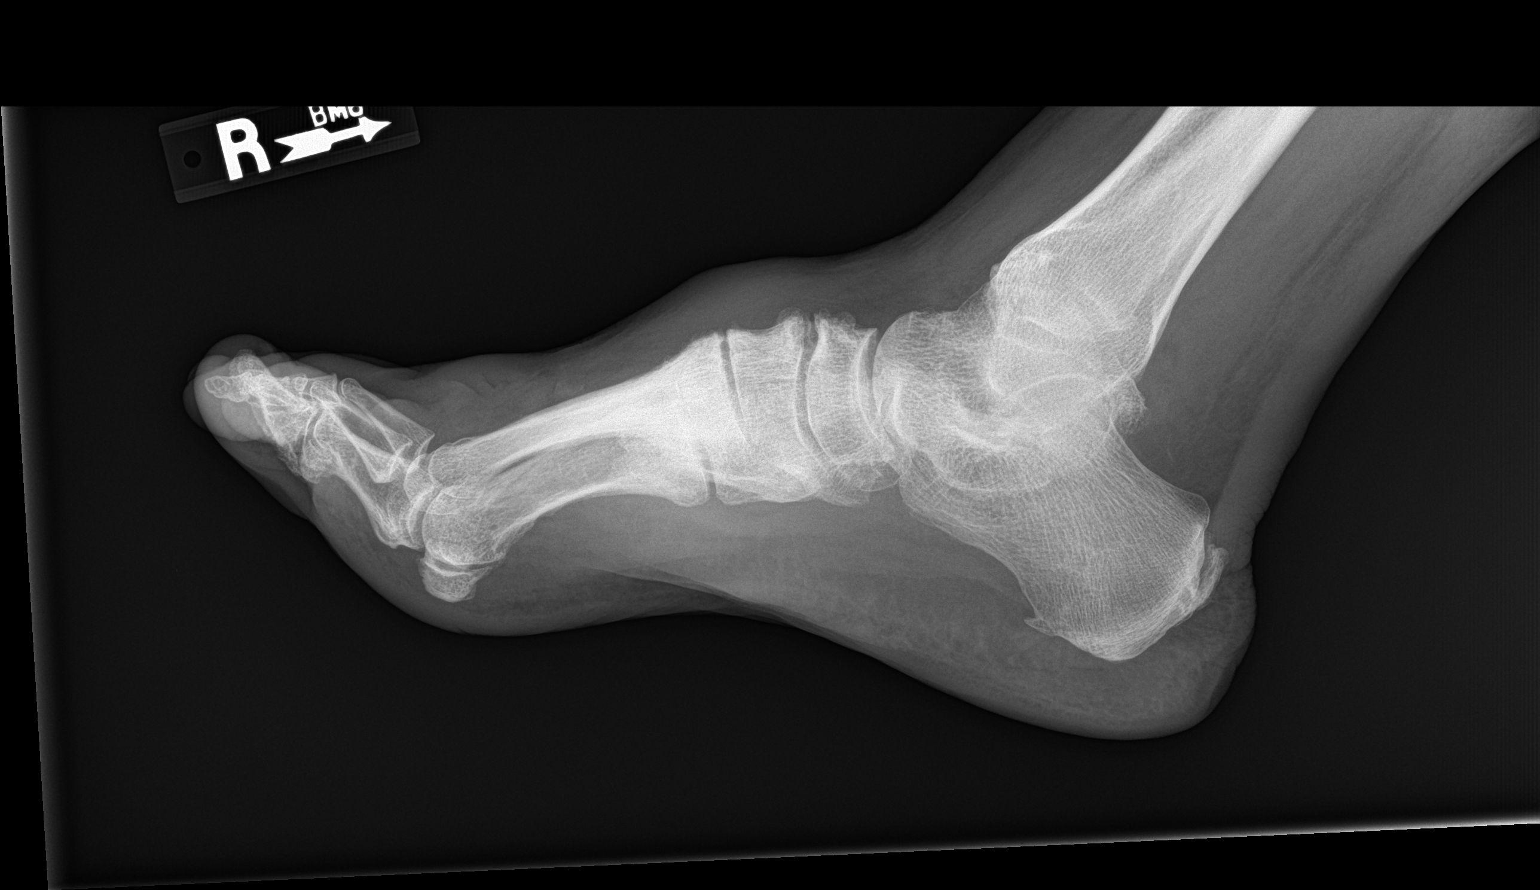

[3 of 3 positions shown; findings below may reference images not displayed]

FINDINGS: Amputation of the fourth and fifth metatarsals without evidence of
osteomyelitis. Negative for fracture. Degenerative change in the
first MTP and interphalangeal joint. Degenerative changes and
spurring in the midfoot with dorsal soft tissue swelling. Calcaneal
spurring.
IMPRESSION: Amputation of the fourth and fifth digits. Negative for
osteomyelitis

Degenerative changes as above.

## 2018-04-21 ENCOUNTER — Other Ambulatory Visit: Payer: Self-pay | Admitting: Family Medicine

## 2018-04-21 DIAGNOSIS — G894 Chronic pain syndrome: Secondary | ICD-10-CM

## 2018-04-21 NOTE — Telephone Encounter (Signed)
Copied from CRM [id number]. Topic: Quick Communication - Rx Refill/Question >> Apr 21, 2018  5:12 PM Lorrine KinMcGee, Xayla Puzio B, [place] wrote: [name]**Sarah Weber patient. Advised patient that he will likely need an appointment to get this medication. Patient refused appointment at this time. Please advise.**   Medication: HYDROcodone-acetaminophen (NORCO) 10-325 MG tablet  Has the patient contacted their pharmacy? Yes.  To call the office (Agent: If no, request that the patient contact the pharmacy for the refill.) (Agent: If yes, when and what did the pharmacy advise?)  Preferred Pharmacy (with phone number or street name): [facility] - [place] - [place], [place] - [address]  Agent: Please be advised that RX refills may take up to 3 business days. We ask that you follow-up with your pharmacy.

## 2018-04-26 DIAGNOSIS — M1A08X0 Idiopathic chronic gout, vertebrae, without tophus (tophi): Secondary | ICD-10-CM | POA: Diagnosis not present

## 2018-04-26 DIAGNOSIS — E7800 Pure hypercholesterolemia, unspecified: Secondary | ICD-10-CM | POA: Diagnosis not present

## 2018-04-26 DIAGNOSIS — Z6836 Body mass index (BMI) 36.0-36.9, adult: Secondary | ICD-10-CM | POA: Diagnosis not present

## 2018-04-26 DIAGNOSIS — E1140 Type 2 diabetes mellitus with diabetic neuropathy, unspecified: Secondary | ICD-10-CM | POA: Diagnosis not present

## 2018-04-26 DIAGNOSIS — E1165 Type 2 diabetes mellitus with hyperglycemia: Secondary | ICD-10-CM | POA: Diagnosis not present

## 2018-04-26 DIAGNOSIS — I10 Essential (primary) hypertension: Secondary | ICD-10-CM | POA: Diagnosis not present

## 2018-04-27 ENCOUNTER — Ambulatory Visit: Payer: PPO | Admitting: Family Medicine

## 2018-05-05 ENCOUNTER — Telehealth: Payer: Self-pay | Admitting: Family Medicine

## 2018-05-05 NOTE — Telephone Encounter (Signed)
No longer our patient. Went to [facility]

## 2018-05-05 NOTE — Telephone Encounter (Signed)
Called but no answer. Tried to reach patient to let him know he was overdue for a CPE and if he followed [name] or is still wanting to be a patient at PCP.

## 2021-02-01 ENCOUNTER — Inpatient Hospital Stay (HOSPITAL_COMMUNITY)
Admission: EM | Admit: 2021-02-01 | Discharge: 2021-02-08 | DRG: 682 | Disposition: A | Discharge status: Home / Self Care | Payer: Medicare Other | Attending: Family Medicine | Admitting: Family Medicine

## 2021-02-01 ENCOUNTER — Other Ambulatory Visit (HOSPITAL_COMMUNITY): Payer: Medicare Other

## 2021-02-01 ENCOUNTER — Emergency Department (HOSPITAL_COMMUNITY): Payer: Medicare Other

## 2021-02-01 ENCOUNTER — Encounter (HOSPITAL_COMMUNITY): Payer: Self-pay

## 2021-02-01 ENCOUNTER — Inpatient Hospital Stay (HOSPITAL_COMMUNITY): Payer: Medicare Other

## 2021-02-01 DIAGNOSIS — Z794 Long term (current) use of insulin: Secondary | ICD-10-CM | POA: Diagnosis not present

## 2021-02-01 DIAGNOSIS — L0390 Cellulitis, unspecified: Secondary | ICD-10-CM | POA: Diagnosis present

## 2021-02-01 DIAGNOSIS — I5033 Acute on chronic diastolic (congestive) heart failure: Secondary | ICD-10-CM | POA: Diagnosis not present

## 2021-02-01 DIAGNOSIS — E1122 Type 2 diabetes mellitus with diabetic chronic kidney disease: Secondary | ICD-10-CM | POA: Diagnosis present

## 2021-02-01 DIAGNOSIS — I130 Hypertensive heart and chronic kidney disease with heart failure and stage 1 through stage 4 chronic kidney disease, or unspecified chronic kidney disease: Secondary | ICD-10-CM | POA: Diagnosis present

## 2021-02-01 DIAGNOSIS — I5031 Acute diastolic (congestive) heart failure: Secondary | ICD-10-CM | POA: Diagnosis not present

## 2021-02-01 DIAGNOSIS — I509 Heart failure, unspecified: Secondary | ICD-10-CM | POA: Diagnosis not present

## 2021-02-01 DIAGNOSIS — N1832 Chronic kidney disease, stage 3b: Secondary | ICD-10-CM | POA: Diagnosis present

## 2021-02-01 DIAGNOSIS — D631 Anemia in chronic kidney disease: Secondary | ICD-10-CM | POA: Diagnosis present

## 2021-02-01 DIAGNOSIS — E1169 Type 2 diabetes mellitus with other specified complication: Secondary | ICD-10-CM | POA: Diagnosis present

## 2021-02-01 DIAGNOSIS — I21A1 Myocardial infarction type 2: Secondary | ICD-10-CM | POA: Diagnosis present

## 2021-02-01 DIAGNOSIS — Z09 Encounter for follow-up examination after completed treatment for conditions other than malignant neoplasm: Secondary | ICD-10-CM

## 2021-02-01 DIAGNOSIS — N179 Acute kidney failure, unspecified: Secondary | ICD-10-CM

## 2021-02-01 DIAGNOSIS — Z825 Family history of asthma and other chronic lower respiratory diseases: Secondary | ICD-10-CM

## 2021-02-01 DIAGNOSIS — R7989 Other specified abnormal findings of blood chemistry: Secondary | ICD-10-CM | POA: Diagnosis present

## 2021-02-01 DIAGNOSIS — Z818 Family history of other mental and behavioral disorders: Secondary | ICD-10-CM

## 2021-02-01 DIAGNOSIS — I2510 Atherosclerotic heart disease of native coronary artery without angina pectoris: Secondary | ICD-10-CM

## 2021-02-01 DIAGNOSIS — Z951 Presence of aortocoronary bypass graft: Secondary | ICD-10-CM

## 2021-02-01 DIAGNOSIS — Z8249 Family history of ischemic heart disease and other diseases of the circulatory system: Secondary | ICD-10-CM

## 2021-02-01 DIAGNOSIS — K921 Melena: Secondary | ICD-10-CM | POA: Diagnosis present

## 2021-02-01 DIAGNOSIS — I5043 Acute on chronic combined systolic (congestive) and diastolic (congestive) heart failure: Secondary | ICD-10-CM | POA: Diagnosis present

## 2021-02-01 DIAGNOSIS — Z79899 Other long term (current) drug therapy: Secondary | ICD-10-CM

## 2021-02-01 DIAGNOSIS — I25110 Atherosclerotic heart disease of native coronary artery with unstable angina pectoris: Secondary | ICD-10-CM | POA: Diagnosis present

## 2021-02-01 DIAGNOSIS — S91301A Unspecified open wound, right foot, initial encounter: Secondary | ICD-10-CM | POA: Diagnosis present

## 2021-02-01 DIAGNOSIS — I252 Old myocardial infarction: Secondary | ICD-10-CM

## 2021-02-01 DIAGNOSIS — Z6835 Body mass index (BMI) 35.0-35.9, adult: Secondary | ICD-10-CM | POA: Diagnosis not present

## 2021-02-01 DIAGNOSIS — Z20822 Contact with and (suspected) exposure to covid-19: Secondary | ICD-10-CM | POA: Diagnosis present

## 2021-02-01 DIAGNOSIS — R7881 Bacteremia: Secondary | ICD-10-CM

## 2021-02-01 DIAGNOSIS — J811 Chronic pulmonary edema: Secondary | ICD-10-CM

## 2021-02-01 DIAGNOSIS — Z87442 Personal history of urinary calculi: Secondary | ICD-10-CM

## 2021-02-01 DIAGNOSIS — Z7982 Long term (current) use of aspirin: Secondary | ICD-10-CM

## 2021-02-01 DIAGNOSIS — M86471 Chronic osteomyelitis with draining sinus, right ankle and foot: Secondary | ICD-10-CM

## 2021-02-01 DIAGNOSIS — E7800 Pure hypercholesterolemia, unspecified: Secondary | ICD-10-CM | POA: Diagnosis present

## 2021-02-01 DIAGNOSIS — B9562 Methicillin resistant Staphylococcus aureus infection as the cause of diseases classified elsewhere: Secondary | ICD-10-CM

## 2021-02-01 DIAGNOSIS — E875 Hyperkalemia: Secondary | ICD-10-CM | POA: Diagnosis present

## 2021-02-01 DIAGNOSIS — D649 Anemia, unspecified: Secondary | ICD-10-CM | POA: Diagnosis present

## 2021-02-01 DIAGNOSIS — E1151 Type 2 diabetes mellitus with diabetic peripheral angiopathy without gangrene: Secondary | ICD-10-CM | POA: Diagnosis present

## 2021-02-01 DIAGNOSIS — R52 Pain, unspecified: Secondary | ICD-10-CM

## 2021-02-01 DIAGNOSIS — E1140 Type 2 diabetes mellitus with diabetic neuropathy, unspecified: Secondary | ICD-10-CM | POA: Diagnosis present

## 2021-02-01 DIAGNOSIS — Z83438 Family history of other disorder of lipoprotein metabolism and other lipidemia: Secondary | ICD-10-CM

## 2021-02-01 DIAGNOSIS — M869 Osteomyelitis, unspecified: Secondary | ICD-10-CM | POA: Diagnosis not present

## 2021-02-01 DIAGNOSIS — Z955 Presence of coronary angioplasty implant and graft: Secondary | ICD-10-CM

## 2021-02-01 DIAGNOSIS — R778 Other specified abnormalities of plasma proteins: Secondary | ICD-10-CM | POA: Diagnosis not present

## 2021-02-01 DIAGNOSIS — Z89519 Acquired absence of unspecified leg below knee: Secondary | ICD-10-CM

## 2021-02-01 DIAGNOSIS — R0600 Dyspnea, unspecified: Secondary | ICD-10-CM

## 2021-02-01 LAB — COMPREHENSIVE METABOLIC PANEL
ALT: 6 U/L (ref 0–44)
AST: 16 U/L (ref 15–41)
Albumin: 2.5 g/dL — ABNORMAL LOW (ref 3.5–5.0)
Alkaline Phosphatase: 80 U/L (ref 38–126)
Anion gap: 10 (ref 5–15)
BUN: 125 mg/dL — ABNORMAL HIGH (ref 6–20)
CO2: 23 mmol/L (ref 22–32)
Calcium: 8.2 mg/dL — ABNORMAL LOW (ref 8.9–10.3)
Chloride: 103 mmol/L (ref 98–111)
Creatinine, Ser: 1.97 mg/dL — ABNORMAL HIGH (ref 0.61–1.24)
GFR, Estimated: 39 mL/min — ABNORMAL LOW (ref >60)
Glucose, Bld: 158 mg/dL — ABNORMAL HIGH (ref 70–99)
Potassium: 5.1 mmol/L (ref 3.5–5.1)
Sodium: 136 mmol/L (ref 135–145)
Total Bilirubin: 1.1 mg/dL (ref 0.3–1.2)
Total Protein: 8.2 g/dL — ABNORMAL HIGH (ref 6.5–8.1)

## 2021-02-01 LAB — HEMOGLOBIN AND HEMATOCRIT, BLOOD
HCT: 25.4 % — ABNORMAL LOW (ref 39.0–52.0)
Hemoglobin: 8 g/dL — ABNORMAL LOW (ref 13.0–17.0)

## 2021-02-01 LAB — CBC WITH DIFFERENTIAL/PLATELET
Abs Immature Granulocytes: 0.05 10*3/uL (ref 0.00–0.07)
Basophils Absolute: 0 10*3/uL (ref 0.0–0.1)
Basophils Relative: 0 %
Eosinophils Absolute: 0.3 10*3/uL (ref 0.0–0.5)
Eosinophils Relative: 3 %
HCT: 27.3 % — ABNORMAL LOW (ref 39.0–52.0)
Hemoglobin: 8.5 g/dL — ABNORMAL LOW (ref 13.0–17.0)
Immature Granulocytes: 1 %
Lymphocytes Relative: 17 %
Lymphs Abs: 1.8 10*3/uL (ref 0.7–4.0)
MCH: 29 pg (ref 26.0–34.0)
MCHC: 31.1 g/dL (ref 30.0–36.0)
MCV: 93.2 fL (ref 80.0–100.0)
Monocytes Absolute: 0.8 10*3/uL (ref 0.1–1.0)
Monocytes Relative: 8 %
Neutro Abs: 7.5 10*3/uL (ref 1.7–7.7)
Neutrophils Relative %: 71 %
Platelets: 262 10*3/uL (ref 150–400)
RBC: 2.93 MIL/uL — ABNORMAL LOW (ref 4.22–5.81)
RDW: 14.8 % (ref 11.5–15.5)
WBC: 10.4 10*3/uL (ref 4.0–10.5)
nRBC: 0 % (ref 0.0–0.2)

## 2021-02-01 LAB — BRAIN NATRIURETIC PEPTIDE: B Natriuretic Peptide: 1671.1 pg/mL — ABNORMAL HIGH (ref 0.0–100.0)

## 2021-02-01 LAB — GLUCOSE, CAPILLARY: Glucose-Capillary: 142 mg/dL — ABNORMAL HIGH (ref 70–99)

## 2021-02-01 LAB — RESP PANEL BY RT-PCR (FLU A&B, COVID) ARPGX2
Influenza A by PCR: NEGATIVE
Influenza B by PCR: NEGATIVE
SARS Coronavirus 2 by RT PCR: NEGATIVE

## 2021-02-01 LAB — POC OCCULT BLOOD, ED: Fecal Occult Bld: POSITIVE — AB

## 2021-02-01 LAB — LACTIC ACID, PLASMA: Lactic Acid, Venous: 1.2 mmol/L (ref 0.5–1.9)

## 2021-02-01 LAB — HEMOGLOBIN A1C
Hgb A1c MFr Bld: 6.3 % — ABNORMAL HIGH (ref 4.8–5.6)
Mean Plasma Glucose: 134.11 mg/dL

## 2021-02-01 MED ORDER — ROSUVASTATIN CALCIUM 20 MG PO TABS
20.0000 mg | ORAL_TABLET | Freq: Every evening | ORAL | Status: DC
  Administered 2021-02-01 – 2021-02-07 (×6): 20 mg via ORAL
  Filled 2021-02-01 (×6): qty 1

## 2021-02-01 MED ORDER — PROCHLORPERAZINE EDISYLATE 10 MG/2ML IJ SOLN
10.0000 mg | Freq: Four times a day (QID) | INTRAMUSCULAR | Status: DC | PRN
Start: 2021-02-01 — End: 2021-02-08
  Administered 2021-02-01 – 2021-02-07 (×10): 10 mg via INTRAVENOUS
  Filled 2021-02-01 (×10): qty 2

## 2021-02-01 MED ORDER — ACETAMINOPHEN 325 MG PO TABS: 650.0000 mg | ORAL_TABLET | Freq: Four times a day (QID) | ORAL | Status: DC | PRN

## 2021-02-01 MED ORDER — FUROSEMIDE 10 MG/ML IJ SOLN
40.0000 mg | Freq: Two times a day (BID) | INTRAMUSCULAR | Status: DC
  Administered 2021-02-01 – 2021-02-02 (×4): 40 mg via INTRAVENOUS
  Filled 2021-02-01 (×4): qty 4

## 2021-02-01 MED ORDER — ACETAMINOPHEN 650 MG RE SUPP: 650.0000 mg | Freq: Four times a day (QID) | RECTAL | Status: DC | PRN

## 2021-02-01 MED ORDER — INSULIN ASPART 100 UNIT/ML IJ SOLN
0.0000 [IU] | Freq: Three times a day (TID) | INTRAMUSCULAR | Status: DC
Start: 2021-02-02 — End: 2021-02-08
  Administered 2021-02-02: 3 [IU] via SUBCUTANEOUS
  Administered 2021-02-02 – 2021-02-03 (×4): 2 [IU] via SUBCUTANEOUS
  Administered 2021-02-03: 3 [IU] via SUBCUTANEOUS
  Administered 2021-02-04: 2 [IU] via SUBCUTANEOUS
  Administered 2021-02-04 (×2): 3 [IU] via SUBCUTANEOUS
  Administered 2021-02-05: 11 [IU] via SUBCUTANEOUS
  Administered 2021-02-05: 2 [IU] via SUBCUTANEOUS
  Administered 2021-02-05 – 2021-02-06 (×2): 3 [IU] via SUBCUTANEOUS
  Administered 2021-02-06: 2 [IU] via SUBCUTANEOUS
  Administered 2021-02-06 – 2021-02-07 (×2): 3 [IU] via SUBCUTANEOUS
  Administered 2021-02-07: 2 [IU] via SUBCUTANEOUS
  Administered 2021-02-07 – 2021-02-08 (×2): 3 [IU] via SUBCUTANEOUS

## 2021-02-01 MED ORDER — SODIUM CHLORIDE 0.9 % IV SOLN: Freq: Once | INTRAVENOUS | Status: AC

## 2021-02-01 MED ORDER — OXYCODONE HCL 5 MG PO TABS
5.0000 mg | ORAL_TABLET | ORAL | Status: DC | PRN
  Administered 2021-02-01: 5 mg via ORAL
  Filled 2021-02-01: qty 1

## 2021-02-01 MED ORDER — ASPIRIN EC 81 MG PO TBEC
81.0000 mg | DELAYED_RELEASE_TABLET | Freq: Every day | ORAL | Status: DC
  Administered 2021-02-02 – 2021-02-08 (×7): 81 mg via ORAL
  Filled 2021-02-01 (×7): qty 1

## 2021-02-01 MED ORDER — PANTOPRAZOLE SODIUM 40 MG IV SOLR
40.0000 mg | Freq: Two times a day (BID) | INTRAVENOUS | Status: DC
  Administered 2021-02-01 – 2021-02-03 (×5): 40 mg via INTRAVENOUS
  Filled 2021-02-01 (×3): qty 40

## 2021-02-01 MED ORDER — HYDROCODONE-ACETAMINOPHEN 10-325 MG PO TABS
1.0000 | ORAL_TABLET | Freq: Four times a day (QID) | ORAL | Status: DC | PRN
  Administered 2021-02-01 – 2021-02-08 (×19): 1 via ORAL
  Filled 2021-02-01 (×20): qty 1

## 2021-02-01 MED ORDER — HYDRALAZINE HCL 20 MG/ML IJ SOLN: 10.0000 mg | Freq: Three times a day (TID) | INTRAMUSCULAR | Status: DC | PRN

## 2021-02-01 MED ORDER — ONDANSETRON HCL 4 MG/2ML IJ SOLN
4.0000 mg | Freq: Once | INTRAMUSCULAR | Status: AC
  Administered 2021-02-01: 4 mg via INTRAVENOUS
  Filled 2021-02-01: qty 2

## 2021-02-01 MED ORDER — INSULIN ASPART 100 UNIT/ML IJ SOLN: 0.0000 [IU] | Freq: Every day | INTRAMUSCULAR | Status: DC

## 2021-02-01 NOTE — ED Triage Notes (Signed)
Pt arrived via POV, states he received a call this morning from PCP due to high BUN. Was recently in hospital for osteomyelitis and chest pain.

## 2021-02-01 NOTE — Consult Note (Signed)
[facility] Consult  Referring Provider: Er Primary Care Physician:  Patient, No Pcp Per (Inactive) Primary Gastroenterologist: Althia Forts  Reason for Consultation: Elevated BUN, concern for GI bleed  HPI: [name] Snow is a 55 y.o. 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 Patient states that while he was hospitalized he had few episodes of hematemesis described as few tablespoon of bright red blood and had black stools. As per him this was investigated with an endoscopy followed by a colonoscopy and a small bowel PillCam without any obvious source of GI bleed.  However, he reports requiring 6 units of PRBC transfusion when he was hospitalized.  Patient states that his kidney function was compromised when he received IV antibiotics for sepsis secondary to osteomyelitis and required 1 session of hemodialysis.  Patient states that for the last 6 days his stools have been light tan-colored, he has not had any further episodes of hematemesis or melena. He denies abdominal pain. He has mild nausea and complains of a decreased appetite.  Patient used to be on Brilinta for cardiac stents, which have been on hold since he was hospitalized, but takes aspirin 81 mg daily and intermittently takes NSAIDs such as ibuprofen.  He was seen by his primary care physician and had labs drawn as an outpatient, states he received a call at 3 AM yesterday that he had very high levels of ammonia and would need hospitalization, hence he proceeded to the ER at [facility].  Patient states he lives alone in a 3 story house and has difficulty with ambulation with partial amputations of both feet. He has an uncle and an aunt who can take care of him, he  has a 80 year old son. His fiance lives in [place].  Past Medical History:  Diagnosis Date   Arthritis    gout   Cataract    CHF (congestive heart failure) (HCC)    patient denies   Chronic kidney disease    Clotting disorder ([place])    patient denies   Diabetes mellitus    Gout    History of kidney stones    stents placed prior to and removed   Hypertension     Past Surgical History:  Procedure Laterality Date   AMPUTATION Right 08/24/2016   Procedure: IRRIGATION AND DEBRIDEMENT OF RIGHT FOOT AND AMPUTATION OF RIGHT FIFTH RAY;  Surgeon: Leandrew Koyanagi, MD;  Location: [facility];  Service: Orthopedics;  Laterality: Right;   I & D EXTREMITY Right 08/27/2016   Procedure: IRRIGATION AND DEBRIDEMENT RIGHT FOOT, VAC,  4TH RAY AMPUTATION;  Surgeon: Leandrew Koyanagi, MD;  Location: [facility];  Service: Orthopedics;  Laterality: Right;   I & D EXTREMITY Right 08/29/2016   Procedure: IRRIGATION AND DEBRIDEMENT EXTREMITY WITH ACELL APPLICATION;  Surgeon: Leandrew Koyanagi, MD;  Location: [place];  Service: Orthopedics;  Laterality: Right;   I & D EXTREMITY Right 09/04/2016   Procedure: IRRIGATION AND DEBRIDEMENT RIGHT FOOT, VAC SPONGE REPLACEMENT;  Surgeon: Leandrew Koyanagi, MD;  Location: [facility];  Service: Orthopedics;  Laterality: Right;   JOINT REPLACEMENT  [id number]   R Hip   kidney stents      Prior to Admission medications   Medication Sig Start Date End Date Taking? Authorizing  Provider  blood glucose meter kit and supplies KIT Dispense based on insurance preference. Use bid as directed. (FOR ICD-9 250.00) 09/29/17   Weber, Damaris Hippo, PA-C  clotrimazole-betamethasone (LOTRISONE) cream Apply 1 application topically 2 (two) times daily. 09/29/17   Weber, Damaris Hippo, PA-C  Continuous Blood Gluc Sensor (FREESTYLE LIBRE 14 DAY SENSOR) MISC 1 Device by Does not apply route every 14 (fourteen) days. 11/26/17   Weber, Damaris Hippo, PA-C  Dulaglutide (TRULICITY) 1.5 WU/9.8JX SOPN Inject 1.5 Units into the skin once a week. 01/01/18    Weber, Damaris Hippo, PA-C  febuxostat (ULORIC) 40 MG tablet Take 1 tablet (40 mg total) by mouth daily. Pt failed allopurinol 02/03/18   Weber, Damaris Hippo, PA-C  gabapentin (NEURONTIN) 400 MG capsule 1 po qam, 2 po q afternon and 2 po qhs 01/01/18   Weber, Sarah L, PA-C  glucose blood test strip Use as instructed 11/04/16   Weber, Damaris Hippo, PA-C  HYDROcodone-acetaminophen (NORCO) 10-325 MG tablet Take 1 tablet by mouth every 4 (four) hours as needed. 03/03/18   Weber, Damaris Hippo, PA-C  HYDROcodone-acetaminophen (NORCO) 10-325 MG tablet Take 1 tablet by mouth every 4 (four) hours as needed. 02/01/18   Weber, Damaris Hippo, PA-C  HYDROcodone-acetaminophen (NORCO) 10-325 MG tablet Take 1 tablet by mouth every 4 (four) hours as needed. 04/03/18   Weber, Damaris Hippo, PA-C  Insulin Glargine (LANTUS SOLOSTAR) 100 UNIT/ML Solostar Pen Inject 36 Units into the skin 2 (two) times daily. 01/01/18   Weber, Damaris Hippo, PA-C  Lancets MISC 1 Device by Does not apply route 3 (three) times daily. E11.40 11/04/16   Weber, Damaris Hippo, PA-C  lisinopril (PRINIVIL,ZESTRIL) 10 MG tablet Take 0.5 tablets (5 mg total) by mouth daily. 01/01/18   Weber, Damaris Hippo, PA-C  rosuvastatin (CRESTOR) 20 MG tablet Take 1 tablet (20 mg total) by mouth every evening. 01/01/18   Weber, Damaris Hippo, PA-C  tiZANidine (ZANAFLEX) 4 MG tablet Take 1 tablet (4 mg total) by mouth every 6 (six) hours as needed for muscle spasms. 02/03/18   Weber, Damaris Hippo, PA-C    No current facility-administered medications for this encounter.   Current Outpatient Medications  Medication Sig Dispense Refill   blood glucose meter kit and supplies KIT Dispense based on insurance preference. Use bid as directed. (FOR ICD-9 250.00) 1 each 0   clotrimazole-betamethasone (LOTRISONE) cream Apply 1 application topically 2 (two) times daily. 45 g 1   Continuous Blood Gluc Sensor (FREESTYLE LIBRE 14 DAY SENSOR) MISC 1 Device by Does not apply route every 14 (fourteen) days. 2 each 6   Dulaglutide (TRULICITY)  1.5 BJ/4.7WG SOPN Inject 1.5 Units into the skin once a week. 12 pen 0   febuxostat (ULORIC) 40 MG tablet Take 1 tablet (40 mg total) by mouth daily. Pt failed allopurinol 90 tablet 1   gabapentin (NEURONTIN) 400 MG capsule 1 po qam, 2 po q afternon and 2 po qhs 450 capsule 0   glucose blood test strip Use as instructed 100 each 12   HYDROcodone-acetaminophen (NORCO) 10-325 MG tablet Take 1 tablet by mouth every 4 (four) hours as needed. 180 tablet 0   HYDROcodone-acetaminophen (NORCO) 10-325 MG tablet Take 1 tablet by mouth every 4 (four) hours as needed. 180 tablet 0   HYDROcodone-acetaminophen (NORCO) 10-325 MG tablet Take 1 tablet by mouth every 4 (four) hours as needed. 180 tablet 0   Insulin Glargine (LANTUS SOLOSTAR) 100 UNIT/ML Solostar Pen Inject 36 Units into the skin 2 (two)  times daily. 10 pen 2   Lancets MISC 1 Device by Does not apply route 3 (three) times daily. E11.40 100 each 12   lisinopril (PRINIVIL,ZESTRIL) 10 MG tablet Take 0.5 tablets (5 mg total) by mouth daily. 90 tablet 0   rosuvastatin (CRESTOR) 20 MG tablet Take 1 tablet (20 mg total) by mouth every evening. 90 tablet 1   tiZANidine (ZANAFLEX) 4 MG tablet Take 1 tablet (4 mg total) by mouth every 6 (six) hours as needed for muscle spasms. 120 tablet 1    Allergies as of 02/01/2021 - Review Complete 02/01/2021  Allergen Reaction Noted   No known allergies  08/26/2016    Family History  Problem Relation Age of Onset   Mental illness Mother    Hypertension Mother    Hyperlipidemia Mother    Mental illness Father    COPD Father    Mental illness Brother     Social History   Socioeconomic History   Marital status: Married    Spouse name: Not on file   Number of children: Not on file   Years of education: Not on file   Highest education level: Not on file  Occupational History   Not on file  Tobacco Use   Smoking status: Never   Smokeless tobacco: Never  Substance and Sexual Activity   Alcohol use: No    Drug use: No   Sexual activity: Never    Birth control/protection: None  Other Topics Concern   Not on file  Social History Narrative   Not on file   Social Determinants of Health   Financial Resource Strain: Not on file  Food Insecurity: Not on file  Transportation Needs: Not on file  Physical Activity: Not on file  Stress: Not on file  Social Connections: Not on file  Intimate Partner Violence: Not on file    Review of Systems: Positive for: GI: Described in detail in HPI.    Gen: Anorexia, fatigue, weakness, malaise, involuntary weight loss, denies any fever, chills, rigors, night sweats  and sleep disorder CV: Denies chest pain, angina, palpitations, syncope, orthopnea, PND, peripheral edema, and claudication. Resp: Denies dyspnea, cough, sputum, wheezing, coughing up blood. GU : Denies urinary burning, blood in urine, urinary frequency, urinary hesitancy, nocturnal urination, and urinary incontinence. MS: B/L partial feet amputations Derm: Denies rash, itching, oral ulcerations, hives, unhealing ulcers.  Psych: Denies depression, anxiety, memory loss, suicidal ideation, hallucinations,  and confusion. Heme: Denies bruising, bleeding, and enlarged lymph nodes. Neuro:  Denies any headaches, dizziness, paresthesias. Endo:  DM, denies any problems with  thyroid, adrenal function.  Physical Exam: Vital signs in last 24 hours: Temp:  [98.2 F (36.8 C)] 98.2 F (36.8 C) (09/16 0619) Pulse Rate:  [40-60] 40 (09/16 0905) Resp:  [15-19] 18 (09/16 0905) BP: (153-195)/(68-86) 153/68 (09/16 0905) SpO2:  [91 %-100 %] 91 % (09/16 0905) Weight:  [117.9 kg] 117.9 kg (09/16 0619)    General:   Alert,  Well-developed, obese, pleasant and cooperative in NAD Head:  Normocephalic and atraumatic. Eyes:  Sclera clear, no icterus.  Mild pallor Ears:  Normal auditory acuity. Nose:  No deformity, discharge,  or lesions. Mouth:  No deformity or lesions.  Oropharynx pink & moist. Neck:   Supple; no masses or thyromegaly. Lungs:  Clear throughout to auscultation.   No wheezes, crackles, or rhonchi. No acute distress. Heart: Bradycardia Extremities: Bipedal edema of lower extremities, b/l partial feet amputation. Neurologic:  Alert and  oriented x4;  grossly  normal neurologically. Skin:  Intact without significant lesions or rashes. Psych:  Alert and cooperative. Normal mood and affect. Abdomen:  Soft, nontender and nondistended. No masses, hepatosplenomegaly or hernias noted. Normal bowel sounds, without guarding, and without rebound.         Lab Results: Recent Labs    02/01/21 0646  WBC 10.4  HGB 8.5*  HCT 27.3*  PLT 262   BMET Recent Labs    02/01/21 0646  NA 136  K 5.1  CL 103  CO2 23  GLUCOSE 158*  BUN 125*  CREATININE 1.97*  CALCIUM 8.2*   LFT Recent Labs    02/01/21 0646  PROT 8.2*  ALBUMIN 2.5*  AST 16  ALT 6  ALKPHOS 80  BILITOT 1.1   PT/INR No results for input(s): LABPROT, INR in the last 72 hours.  Studies/Results: DG Chest Portable 1 View  Result Date: 02/01/2021 CLINICAL DATA:  Shortness of breath EXAM: PORTABLE CHEST 1 VIEW COMPARISON:  03/29/2012 FINDINGS: Interstitial coarsening with indistinct density at the bases. No Kerley lines or pleural effusion. Lung volumes are low, similar to prior. Generous heart size accentuated by mediastinal fat when compared with 2009 CT. Negative aortic and hilar contours. IMPRESSION: Indistinct density at the bases which could be atelectasis or atypical infection. Electronically Signed   By: Jorje Guild M.D.   On: 02/01/2021 07:56    Impression: Recent history of hematemesis and black stools, patient states he required 6 unit PRBC transfusion. However reports that endoscopy, colonoscopy and capsule endoscopy performed during recent hospitalization at [place] were negative/unremarkable. Patient reports that when he was discharged his hemoglobin was around 8. Hemoglobin is 8.5 on admission,  with a normal MCV and platelet.  As per ED attending, stools are brown in color but FOBT positive. He has significant elevation of BUN at 125, with a creatinine of 1.97, and potassium of 5.1 Elevated BNP of 1671  Multiple comorbidities-chronic kidney disease,Diabetes, neuropathy, CHF, CAD with cardiac stents, bilateral partial feet amputations, recent osteomyelitis  Plan: Will attempt on getting recent endoscopy, colonoscopy and capsule endoscopy performed at [facility] in [place], [place]. Although BUN is severely elevated, without recent hematemesis or melena, and hemoglobin being at baseline, I will not plan endoscopic procedures for now. He is not hypotensive or tachycardic. Elevated BUN could be related to impaired renal function, patient is to receive Lasix 40 mg IV every 12 hours for now. We will continue to follow. Will put patient on pantoprazole 40 mg IV twice daily for now.    LOS: 0 days   Ronnette Juniper, MD  02/01/2021, 10:19 AM
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
## 2021-02-01 NOTE — H&P (Signed)
History and Physical    [name] Snow QIH:[id number] DOB: 1965/10/02 DOA: 02/01/2021  PCP: Patient, No Pcp Per (Inactive)  Patient coming from: Home  Chief Complaint: swelling.  HPI: [name] Snow is a 55 y.o. male with medical history significant of CAD, CKD3a, Gout. Presenting with fatigue, N/V, swelling. He reports that he was recently in a hospital in [place], [place]. He had infected metatarsal amputations. He was given abx that he bleed precipitated renal failure requiring a short course of dialysis. Also during his stay, he had a GIB and required 6 units pRBCs per his account. He had an EGD and c-scope; however, no source of bleed was identified. After a 19-day stay, he signed himself out AMA. He got home 4 or 5 days ago. He saw his PCP for follow up. Labs were drawn and he was notified yesterday that he needed to come to the ED because he renal function was poor. He reports that since he's been back home, he has had increased fatigue and nausea. He's had dry heaves, but he has been able to eat. He had become more dyspneic. He is having difficulty lying flat. He reports increased edema in his legs. He denies any other aggravating or alleviating factors.   ED Course: He was found to have a BNP of 1600+. CXR showed indistinct density at the bases which could be atelectasis or atypical infection. He was found to be in renal failure. He was given fluids. TRH was called for admission.   Review of Systems:  Denies CP, palpitations, lightheadedness, dizziness, fevers, sick contact. Review of systems is otherwise negative for all not mentioned in HPI.   PMHx Past Medical History:  Diagnosis Date   Arthritis    gout   Cataract    CHF (congestive heart failure) ([place])    patient denies   Chronic kidney disease    Clotting disorder ([place])    patient denies   Diabetes mellitus    Gout    History of kidney stones    stents placed prior to and removed   Hypertension     PSHx Past Surgical  History:  Procedure Laterality Date   AMPUTATION Right 08/24/2016   Procedure: IRRIGATION AND DEBRIDEMENT OF RIGHT FOOT AND AMPUTATION OF RIGHT FIFTH RAY;  Surgeon: Leandrew Koyanagi, MD;  Location: [facility];  Service: Orthopedics;  Laterality: Right;   I & D EXTREMITY Right 08/27/2016   Procedure: IRRIGATION AND DEBRIDEMENT RIGHT FOOT, VAC,  4TH RAY AMPUTATION;  Surgeon: Leandrew Koyanagi, MD;  Location: [place];  Service: Orthopedics;  Laterality: Right;   I & D EXTREMITY Right 08/29/2016   Procedure: IRRIGATION AND DEBRIDEMENT EXTREMITY WITH ACELL APPLICATION;  Surgeon: Leandrew Koyanagi, MD;  Location: [place];  Service: Orthopedics;  Laterality: Right;   I & D EXTREMITY Right 09/04/2016   Procedure: IRRIGATION AND DEBRIDEMENT RIGHT FOOT, VAC SPONGE REPLACEMENT;  Surgeon: Leandrew Koyanagi, MD;  Location: [facility];  Service: Orthopedics;  Laterality: Right;   JOINT REPLACEMENT  [id number]   R Hip   kidney stents      SocHx  reports that he has never smoked. He has never used smokeless tobacco. He reports that he does not drink alcohol and does not use drugs.  Allergies  Allergen Reactions   No Known Allergies     FamHx Family History  Problem Relation Age of Onset   Mental illness Mother    Hypertension Mother    Hyperlipidemia Mother  Mental illness Father    COPD Father    Mental illness Brother     Prior to Admission medications   Medication Sig Start Date End Date Taking? Authorizing Provider  blood glucose meter kit and supplies KIT Dispense based on insurance preference. Use bid as directed. (FOR ICD-9 250.00) 09/29/17   Weber, Damaris Hippo, PA-C  clotrimazole-betamethasone (LOTRISONE) cream Apply 1 application topically 2 (two) times daily. 09/29/17   Weber, Damaris Hippo, PA-C  Continuous Blood Gluc Sensor (FREESTYLE LIBRE 14 DAY SENSOR) MISC 1 Device by Does not apply route every 14 (fourteen) days. 11/26/17   Weber, Damaris Hippo, PA-C  Dulaglutide (TRULICITY) 1.5 CL/2.7NT SOPN Inject 1.5 Units into the skin once a  week. 01/01/18   Weber, Damaris Hippo, PA-C  febuxostat (ULORIC) 40 MG tablet Take 1 tablet (40 mg total) by mouth daily. Pt failed allopurinol 02/03/18   Weber, Damaris Hippo, PA-C  gabapentin (NEURONTIN) 400 MG capsule 1 po qam, 2 po q afternon and 2 po qhs 01/01/18   Weber, Sarah L, PA-C  glucose blood test strip Use as instructed 11/04/16   Weber, Damaris Hippo, PA-C  HYDROcodone-acetaminophen (NORCO) 10-325 MG tablet Take 1 tablet by mouth every 4 (four) hours as needed. 03/03/18   Weber, Damaris Hippo, PA-C  HYDROcodone-acetaminophen (NORCO) 10-325 MG tablet Take 1 tablet by mouth every 4 (four) hours as needed. 02/01/18   Weber, Damaris Hippo, PA-C  HYDROcodone-acetaminophen (NORCO) 10-325 MG tablet Take 1 tablet by mouth every 4 (four) hours as needed. 04/03/18   Weber, Damaris Hippo, PA-C  Insulin Glargine (LANTUS SOLOSTAR) 100 UNIT/ML Solostar Pen Inject 36 Units into the skin 2 (two) times daily. 01/01/18   Weber, Damaris Hippo, PA-C  Lancets MISC 1 Device by Does not apply route 3 (three) times daily. E11.40 11/04/16   Weber, Damaris Hippo, PA-C  lisinopril (PRINIVIL,ZESTRIL) 10 MG tablet Take 0.5 tablets (5 mg total) by mouth daily. 01/01/18   Weber, Damaris Hippo, PA-C  rosuvastatin (CRESTOR) 20 MG tablet Take 1 tablet (20 mg total) by mouth every evening. 01/01/18   Weber, Damaris Hippo, PA-C  tiZANidine (ZANAFLEX) 4 MG tablet Take 1 tablet (4 mg total) by mouth every 6 (six) hours as needed for muscle spasms. 02/03/18   Mancel Bale, PA-C    Physical Exam: Vitals:   02/01/21 0830 02/01/21 0850 02/01/21 0900 02/01/21 0905  BP: (!) 162/73  (!) 153/68 (!) 153/68  Pulse: (!) 53 (!) 41  (!) 40  Resp: _0 Temp:      TempSrc:      SpO2: 98% 97%  91%  Weight:      Height:        General: 55 y.o. male resting in bed in NAD Eyes: PERRL, normal sclera ENMT: Nares patent w/o discharge, orophaynx clear, dentition normal, ears w/o discharge/lesions/ulcers Neck: Supple, trachea midline Cardiovascular: brady, +S1, S2, no m/g/r, equal pulses  throughout Respiratory: somewhat decreased at bases, no w/r/r, normal WOB GI: BS+, NDNT, no masses noted, no organomegaly noted MSK: No c/c; BLE transmetatarsal amputations, BLE 2-3+ edema to his knees Neuro: A&O x 3, no focal deficits Psyc: Appropriate interaction and affect, calm/cooperative  Labs on Admission: I have personally reviewed following labs and imaging studies  CBC: Recent Labs  Lab 02/01/21 0646  WBC 10.4  NEUTROABS 7.5  HGB 8.5*  HCT 27.3*  MCV 93.2  PLT 700   Basic Metabolic Panel: Recent Labs  Lab 02/01/21 0646  NA 136  K  5.1  CL 103  CO2 23  GLUCOSE 158*  BUN 125*  CREATININE 1.97*  CALCIUM 8.2*   GFR: Estimated Creatinine Clearance: 54 mL/min (A) (by C-G formula based on SCr of 1.97 mg/dL (H)). Liver Function Tests: Recent Labs  Lab 02/01/21 0646  AST 16  ALT 6  ALKPHOS 80  BILITOT 1.1  PROT 8.2*  ALBUMIN 2.5*   No results for input(s): LIPASE, AMYLASE in the last 168 hours. No results for input(s): AMMONIA in the last 168 hours. Coagulation Profile: No results for input(s): INR, PROTIME in the last 168 hours. Cardiac Enzymes: No results for input(s): CKTOTAL, CKMB, CKMBINDEX, TROPONINI in the last 168 hours. BNP (last 3 results) No results for input(s): PROBNP in the last 8760 hours. HbA1C: No results for input(s): HGBA1C in the last 72 hours. CBG: No results for input(s): GLUCAP in the last 168 hours. Lipid Profile: No results for input(s): CHOL, HDL, LDLCALC, TRIG, CHOLHDL, LDLDIRECT in the last 72 hours. Thyroid Function Tests: No results for input(s): TSH, T4TOTAL, FREET4, T3FREE, THYROIDAB in the last 72 hours. Anemia Panel: No results for input(s): VITAMINB12, FOLATE, FERRITIN, TIBC, IRON, RETICCTPCT in the last 72 hours. Urine analysis:    Component Value Date/Time   COLORURINE YELLOW 08/22/2016 2007   APPEARANCEUR HAZY (A) 08/22/2016 2007   LABSPEC 1.016 08/22/2016 2007   PHURINE 5.0 08/22/2016 2007   GLUCOSEU 150 (A)  08/22/2016 2007   HGBUR LARGE (A) 08/22/2016 2007   BILIRUBINUR negative 09/30/2016 1358   BILIRUBINUR neg 06/17/2014 0935   KETONESUR negative 09/30/2016 [place] 08/22/2016 2007   PROTEINUR negative 09/30/2016 1358   PROTEINUR NEGATIVE 08/22/2016 2007   UROBILINOGEN 0.2 09/30/2016 1358   UROBILINOGEN 0.2 06/11/2010 2040   NITRITE Negative 09/30/2016 1358   NITRITE NEGATIVE 08/22/2016 2007   LEUKOCYTESUR Trace (A) 09/30/2016 1358    Radiological Exams on Admission: DG Chest Portable 1 View  Result Date: 02/01/2021 CLINICAL DATA:  Shortness of breath EXAM: PORTABLE CHEST 1 VIEW COMPARISON:  03/29/2012 FINDINGS: Interstitial coarsening with indistinct density at the bases. No Kerley lines or pleural effusion. Lung volumes are low, similar to prior. Generous heart size accentuated by mediastinal fat when compared with 2009 CT. Negative aortic and hilar contours. IMPRESSION: Indistinct density at the bases which could be atelectasis or atypical infection. Electronically Signed   By: Jorje Guild M.D.   On: 02/01/2021 07:56    EKG: Independently reviewed. Sinus bradycardia, no st elevations  Assessment/Plan CHF exacerbation, unknown type     - admit to inpt, tele     - has 2 - 3+ pitting edema BLE to his knees     - lasix 40 mg IV BID     - I&O, daily wts     - fluid restriction to 1500cc/day     - echo ordered     - consider cards consult if not improving on above  AKI on CKD3a     - baseline Scr is in the 1.2 - 1.4 range; he's 1.97 today     - says that he kidney function has taken a hit since he had antibiotics for a wound infection in his recent hospital stay     - he is not acidotic; he's K+ is ok     - follow UOP and check renal US     - watch nephrotoxins     - he will have lasix for his volume overload; monitor     -  if not improving on this regimen, consider nephro consult  Normocytic anemia Positive hemoccult, possible GIB     - baseline Hgb is 9.5  to 10.5; he's 8.3 this morning; elevated BUN     - positive FOBT; stools are brown     - check iron studies, follow H&H     - add protonix     - [facility] consulted, appreciate assistance  DM2     - A1c, SSI, glucose checks, DM diet  BLE transmetatarsal amputations     - recently performed, will have [facility] look at them  HLD     - continue statin  Hx of gout     - on urloric; hold for now  HTN     - on ACEi; hold for now     - will have PRN hydralazine  CAD     - continue ASA, statin   DVT prophylaxis: SCDs  Code Status: FULL  Family Communication: None at bedside  Consults called: [facility]   Status is: Inpatient  Remains inpatient appropriate because:Inpatient level of care appropriate due to severity of illness  Dispo: The patient is from: Home              Anticipated d/c is to: Home              Patient currently is not medically stable to d/c.   Difficult to place patient No  Time spent coordinating admission: 70 minutes  [facility]  If 7PM-7AM, please contact night-coverage [email or web address]  02/01/2021, 9:35 AM

## 2021-02-01 NOTE — ED Provider Notes (Addendum)
[place] DEPT Provider Note   CSN: [id number] Arrival date & time: 02/01/21  0554     History Chief Complaint  Patient presents with   Abnormal Lab    [name] Snow is a 55 y.o. male with history of CHF, CKD, diabetes mellitus, status post ray amputation, recent osteomyelitis.  Patient was recently admitted to [facility] in [place] 8/24  to 9/11 for sepsis secondary to osteomyelitis.  IV antibiotics was recommended by infectious disease at previous hospital however patient did not complete antibiotics due to leaving AMA.  Patient has ambulatory referral to infectious disease however has not follow-up with them yet.  Patient has history of CAD, CABG was scheduled but patient unable to undergo procedure due to recent hospitalization.  Patient noted to be hypertensive at recent primary care visit, lisinopril medication was stopped and patient was started on amlodipine, patient also on Lasix.  Patient presents to the emergency department today reporting that his primary care provider obtain lab work yesterday which showed kidney function was elevated.  Per chart review creatinine 1.93 and BUN 108.  Patient states that while admitted to the hospital is kidney function was elevated and he required 1 round of dialysis.  Patient reports that he was having difficulty urinating while in the hospital however this has improved since returning home.  Patient reports that his appetite and fluid intake has been decreased but slowly improving since returning home.  Endorses nausea and vomiting.  Patient reports emesis as clear.  Patient also endorses shortness of breath and orthopnea.  Patient reports that his orthopnea has been getting progressively worse.  Patient reports that he feels like he is carrying fluid on his stomach.  Patient denies any chest pain.     Abnormal Lab Patient referred by:  PCP Result type: chemistry   Chemistry:    BUN:  High    Creatinine:  High     Past Medical History:  Diagnosis Date   Arthritis    gout   Cataract    CHF (congestive heart failure) ([place])    patient denies   Chronic kidney disease    Clotting disorder ([place])    patient denies   Diabetes mellitus    Gout    History of kidney stones    stents placed prior to and removed   Hypertension     Patient Active Problem List   Diagnosis Date Noted   Open wound of right foot 01/28/2017   History of osteomyelitis 08/23/2016   SIRS (systemic inflammatory response syndrome) ([place]) 08/22/2016   HTN (hypertension) 07/22/2016   Chronic pain syndrome 01/17/2016   Pain in joint, shoulder region 06/29/2015   Hyperlipidemia 10/17/2014   Arteriosclerosis 06/17/2014   Lung nodule-CT 3/15 06/17/2014   Hepatomegaly-steatosis 08/08/2013   DM neuropathy, painful ([place]) 10/19/2011   Hip pain, chronic 10/19/2011   Onychomycosis 10/19/2011   Nephrolithiasis 10/19/2011   Carpal tunnel syndrome 10/19/2011   Obesity, Class III, BMI 40-49.9 (morbid obesity) ([place]) 04/30/2011   DM type 2, uncontrolled, 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   Gout 04/30/2011   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    Past Surgical History:  Procedure Laterality Date   AMPUTATION Right 08/24/2016   Procedure: IRRIGATION AND DEBRIDEMENT OF RIGHT FOOT AND AMPUTATION OF RIGHT FIFTH RAY;  Surgeon: Leandrew Koyanagi, MD;  Location: [facility];  Service: Orthopedics;  Laterality: Right;   I & D EXTREMITY Right 08/27/2016   Procedure: IRRIGATION AND DEBRIDEMENT RIGHT FOOT, VAC,  4TH RAY AMPUTATION;  Surgeon: Leandrew Koyanagi, MD;  Location: [place];  Service: Orthopedics;  Laterality: Right;   I & D EXTREMITY Right 08/29/2016   Procedure: IRRIGATION AND DEBRIDEMENT EXTREMITY WITH ACELL APPLICATION;  Surgeon: Leandrew Koyanagi, MD;  Location: [place];  Service: Orthopedics;  Laterality: Right;   I & D EXTREMITY Right 09/04/2016   Procedure: IRRIGATION AND DEBRIDEMENT RIGHT FOOT, VAC SPONGE REPLACEMENT;   Surgeon: Leandrew Koyanagi, MD;  Location: [facility];  Service: Orthopedics;  Laterality: Right;   JOINT REPLACEMENT  [id number]   R Hip   kidney stents         Family History  Problem Relation Age of Onset   Mental illness Mother    Hypertension Mother    Hyperlipidemia Mother    Mental illness Father    COPD Father    Mental illness Brother     Social History   Tobacco Use   Smoking status: Never   Smokeless tobacco: Never  Substance Use Topics   Alcohol use: No   Drug use: No    Home Medications Prior to Admission medications   Medication Sig Start Date End Date Taking? Authorizing Provider  blood glucose meter kit and supplies KIT Dispense based on insurance preference. Use bid as directed. (FOR ICD-9 250.00) 09/29/17   Weber, Damaris Hippo, PA-C  clotrimazole-betamethasone (LOTRISONE) cream Apply 1 application topically 2 (two) times daily. 09/29/17   Weber, Damaris Hippo, PA-C  Continuous Blood Gluc Sensor (FREESTYLE LIBRE 14 DAY SENSOR) MISC 1 Device by Does not apply route every 14 (fourteen) days. 11/26/17   Weber, Damaris Hippo, PA-C  Dulaglutide (TRULICITY) 1.5 YQ/6.5HQ SOPN Inject 1.5 Units into the skin once a week. 01/01/18   Weber, Damaris Hippo, PA-C  febuxostat (ULORIC) 40 MG tablet Take 1 tablet (40 mg total) by mouth daily. Pt failed allopurinol 02/03/18   Weber, Damaris Hippo, PA-C  gabapentin (NEURONTIN) 400 MG capsule 1 po qam, 2 po q afternon and 2 po qhs 01/01/18   Weber, Sarah L, PA-C  glucose blood test strip Use as instructed 11/04/16   Weber, Damaris Hippo, PA-C  HYDROcodone-acetaminophen (NORCO) 10-325 MG tablet Take 1 tablet by mouth every 4 (four) hours as needed. 03/03/18   Weber, Damaris Hippo, PA-C  HYDROcodone-acetaminophen (NORCO) 10-325 MG tablet Take 1 tablet by mouth every 4 (four) hours as needed. 02/01/18   Weber, Damaris Hippo, PA-C  HYDROcodone-acetaminophen (NORCO) 10-325 MG tablet Take 1 tablet by mouth every 4 (four) hours as needed. 04/03/18   Weber, Damaris Hippo, PA-C  Insulin Glargine (LANTUS  SOLOSTAR) 100 UNIT/ML Solostar Pen Inject 36 Units into the skin 2 (two) times daily. 01/01/18   Weber, Damaris Hippo, PA-C  Lancets MISC 1 Device by Does not apply route 3 (three) times daily. E11.40 11/04/16   Weber, Damaris Hippo, PA-C  lisinopril (PRINIVIL,ZESTRIL) 10 MG tablet Take 0.5 tablets (5 mg total) by mouth daily. 01/01/18   Weber, Damaris Hippo, PA-C  rosuvastatin (CRESTOR) 20 MG tablet Take 1 tablet (20 mg total) by mouth every evening. 01/01/18   Weber, Damaris Hippo, PA-C  tiZANidine (ZANAFLEX) 4 MG tablet Take 1 tablet (4 mg total) by mouth every 6 (six) hours as needed for muscle spasms. 02/03/18   Weber, Damaris Hippo, PA-C    Allergies    No known allergies  Review of Systems   Review of Systems  Constitutional:  Negative for chills and fever.  Eyes:  Negative for visual disturbance.  Respiratory:  Positive  for shortness of breath. Negative for cough.   Cardiovascular:  Negative for chest pain, palpitations and leg swelling.  Gastrointestinal:  Positive for nausea and vomiting. Negative for abdominal pain, anal bleeding and blood in stool.  Genitourinary:  Negative for difficulty urinating, dysuria, frequency and hematuria.  Musculoskeletal:  Negative for back pain and neck pain.  Skin:  Negative for color change and rash.  Neurological:  Positive for numbness (Neuropathy to bilateral legs, baseline for patient). Negative for dizziness, syncope, light-headedness and headaches.  Psychiatric/Behavioral:  Negative for confusion.    Physical Exam Updated Vital Signs BP (!) 195/86 (BP Location: Right Arm)   Pulse 60   Temp 98.2 F (36.8 C) (Oral)   Resp 18   Ht _0  (1.803 m)   Wt 117.9 kg   SpO2 95%   BMI 36.26 kg/m   Physical Exam Vitals and nursing note reviewed.  Constitutional:      General: He is not in acute distress.    Appearance: He is not ill-appearing, toxic-appearing or diaphoretic.  HENT:     Head: Normocephalic.  Eyes:     General: No scleral icterus.       Right eye: No  discharge.        Left eye: No discharge.  Cardiovascular:     Rate and Rhythm: Normal rate.     Pulses:          Dorsalis pedis pulses are 2+ on the right side and detected w/ Doppler on the left side.     Heart sounds: Normal heart sounds.  Pulmonary:     Effort: Pulmonary effort is normal. No tachypnea or bradypnea.     Breath sounds: Normal breath sounds. No stridor.  Abdominal:     General: Abdomen is protuberant. There is no distension. There are no signs of injury.     Palpations: Abdomen is soft. There is no mass or pulsatile mass.     Tenderness: There is no abdominal tenderness. There is no guarding or rebound.     Hernia: There is no hernia in the umbilical area or ventral area.  Genitourinary:    Rectum: Guaiac result positive. No mass, tenderness, anal fissure, external hemorrhoid or internal hemorrhoid. Normal anal tone.     Comments: Light brown stool noted in rectal vault.  No melena or frank red blood noted. Musculoskeletal:     Cervical back: Neck supple.     Right lower leg: 3+ Edema present.     Left lower leg: 3+ Edema present.  Feet:     Right foot:     Skin integrity: Blister, erythema, callus and dry skin present.     Left foot:     Skin integrity: Blister, callus and dry skin present. No erythema.     Comments: Status post amputation of all rays bilaterally.  Patient has sutures to bilateral feet.  Wounds are clean, dry, and intact.  No purulent discharge.  Minimal erythema noted to right foot. Skin:    General: Skin is warm and dry.  Neurological:     General: No focal deficit present.     Mental Status: He is alert and oriented to person, place, and time.  Psychiatric:        Behavior: Behavior is cooperative.    ED Results / Procedures / Treatments   Labs (all labs ordered are listed, but only abnormal results are displayed) Labs Reviewed  COMPREHENSIVE METABOLIC PANEL - Abnormal; Notable for the following components:  Result Value   Glucose,  Bld 158 (*)    BUN 125 (*)    Creatinine, Ser 1.97 (*)    Calcium 8.2 (*)    Total Protein 8.2 (*)    Albumin 2.5 (*)    GFR, Estimated 39 (*)    All other components within normal limits  CBC WITH DIFFERENTIAL/PLATELET - Abnormal; Notable for the following components:   RBC 2.93 (*)    Hemoglobin 8.5 (*)    HCT 27.3 (*)    All other components within normal limits  BRAIN NATRIURETIC PEPTIDE - Abnormal; Notable for the following components:   B Natriuretic Peptide 1,671.1 (*)    All other components within normal limits  POC OCCULT BLOOD, ED - Abnormal; Notable for the following components:   Fecal Occult Bld POSITIVE (*)    All other components within normal limits  RESP PANEL BY RT-PCR (FLU A&B, COVID) [id number]  LACTIC ACID, PLASMA    EKG EKG Interpretation  Date/Time:  Friday February 01 2021 07:37:24 EDT Ventricular Rate:  50 PR Interval:  175 QRS Duration: 102 QT Interval:  479 QTC Calculation: 437 R Axis:     Text Interpretation: Sinus bradycardia Since last tracing rate slower Confirmed by Dorie Rank [id number]) on 02/01/2021 7:56:31 AM  Radiology DG Chest Portable 1 View  Result Date: 02/01/2021 CLINICAL DATA:  Shortness of breath EXAM: PORTABLE CHEST 1 VIEW COMPARISON:  03/29/2012 FINDINGS: Interstitial coarsening with indistinct density at the bases. No Kerley lines or pleural effusion. Lung volumes are low, similar to prior. Generous heart size accentuated by mediastinal fat when compared with 2009 CT. Negative aortic and hilar contours. IMPRESSION: Indistinct density at the bases which could be atelectasis or atypical infection. Electronically Signed   By: Jorje Guild M.D.   On: 02/01/2021 07:56    Procedures Procedures   Medications Ordered in ED Medications - No data to display  ED Course  I have reviewed the triage vital signs and the nursing notes.  Pertinent labs & imaging results that were available during my care of the patient were reviewed by me and  considered in my medical decision making (see chart for details).  Clinical Course as of 02/01/21 1806  Fri Feb 01, 2021  2426 Spoke to hospitalist Dr. Marylyn Ishihara will see the patient for admission. [PB]  [phone] Spoke to gastroenterologist Dr.Karki will see the patient for consult. [PB]    Clinical Course User Index [PB] Dyann Ruddle   MDM Rules/Calculators/A&P                           Alert 54 year old male no acute distress, nontoxic-appearing.  Patient presents emergency department with a chief complaint of abnormal lab results.  Per chart review patient creatinine previously 1.30 and April 2022.  With BUN within normal limits.yesterday which showed kidney function was elevated.  Per chart review creatinine 1.93 and BUN 108.  Patient states that while admitted to the hospital is kidney function was elevated and he required 1 round of dialysis.  CMP obtained today shows creatinine elevated at 1.97 and BUN elevated at 125.  Unclear of uremia is due to acute GI bleed or dehydration.  We will start patient on maintenance fluids.  CBC shows no signs of leukocytosis.  Hemoglobin noted to be decreased at 8.5.  Patient reports that while in the hospital he had melena and hematemesis.  Patient reports colonoscopy and EGD performed.  Patient reports that  he was transfused 6 units of blood.  Patient denies any hematemesis, melena, hematuria or known blood loss at this time.  Echo performed 08/2020 showed LV ejection fraction = 55-60%.  BUN obtained 1671.  Suspect acute CHF.  Will consult hospitalist for admission due to AKI, uremia, and acute CHF exacerbation.    Patient care and treatment were discussed with attending physician Dr. Tomi Bamberger.  Final Clinical Impression(s) / ED Diagnoses Final diagnoses:  None    Rx / DC Orders ED Discharge Orders     None        Loni Beckwith, PA-C 02/01/21 1749    Loni Beckwith, PA-C 02/01/21 1806    Loni Beckwith,  PA-C 02/01/21 1807    Dorie Rank, MD 02/02/21 [phone]

## 2021-02-02 ENCOUNTER — Other Ambulatory Visit: Payer: Self-pay

## 2021-02-02 ENCOUNTER — Inpatient Hospital Stay (HOSPITAL_COMMUNITY): Payer: Medicare Other

## 2021-02-02 DIAGNOSIS — I5031 Acute diastolic (congestive) heart failure: Secondary | ICD-10-CM

## 2021-02-02 DIAGNOSIS — I509 Heart failure, unspecified: Secondary | ICD-10-CM | POA: Diagnosis not present

## 2021-02-02 DIAGNOSIS — N179 Acute kidney failure, unspecified: Secondary | ICD-10-CM | POA: Diagnosis not present

## 2021-02-02 LAB — COMPREHENSIVE METABOLIC PANEL
ALT: 5 U/L (ref 0–44)
AST: 13 U/L — ABNORMAL LOW (ref 15–41)
Albumin: 2.3 g/dL — ABNORMAL LOW (ref 3.5–5.0)
Alkaline Phosphatase: 70 U/L (ref 38–126)
Anion gap: 8 (ref 5–15)
BUN: 121 mg/dL — ABNORMAL HIGH (ref 6–20)
CO2: 24 mmol/L (ref 22–32)
Calcium: 8.3 mg/dL — ABNORMAL LOW (ref 8.9–10.3)
Chloride: 109 mmol/L (ref 98–111)
Creatinine, Ser: 1.94 mg/dL — ABNORMAL HIGH (ref 0.61–1.24)
GFR, Estimated: 40 mL/min — ABNORMAL LOW (ref >60)
Glucose, Bld: 142 mg/dL — ABNORMAL HIGH (ref 70–99)
Potassium: 5.4 mmol/L — ABNORMAL HIGH (ref 3.5–5.1)
Sodium: 141 mmol/L (ref 135–145)
Total Bilirubin: 0.7 mg/dL (ref 0.3–1.2)
Total Protein: 7.7 g/dL (ref 6.5–8.1)

## 2021-02-02 LAB — CBC
HCT: 24.8 % — ABNORMAL LOW (ref 39.0–52.0)
Hemoglobin: 7.9 g/dL — ABNORMAL LOW (ref 13.0–17.0)
MCH: 29.3 pg (ref 26.0–34.0)
MCHC: 31.9 g/dL (ref 30.0–36.0)
MCV: 91.9 fL (ref 80.0–100.0)
Platelets: 243 10*3/uL (ref 150–400)
RBC: 2.7 MIL/uL — ABNORMAL LOW (ref 4.22–5.81)
RDW: 14.9 % (ref 11.5–15.5)
WBC: 9.7 10*3/uL (ref 4.0–10.5)
nRBC: 0 % (ref 0.0–0.2)

## 2021-02-02 LAB — GLUCOSE, CAPILLARY
Glucose-Capillary: 126 mg/dL — ABNORMAL HIGH (ref 70–99)
Glucose-Capillary: 187 mg/dL — ABNORMAL HIGH (ref 70–99)
Glucose-Capillary: 148 mg/dL — ABNORMAL HIGH (ref 70–99)
Glucose-Capillary: 140 mg/dL — ABNORMAL HIGH (ref 70–99)

## 2021-02-02 LAB — ECHOCARDIOGRAM COMPLETE
AR max vel: 3.22 cm2
AV Area VTI: 3.04 cm2
AV Area mean vel: 3.04 cm2
AV Mean grad: 4 mmHg
AV Peak grad: 8.2 mmHg
Ao pk vel: 1.43 m/s
Area-P 1/2: 4.8 cm2
Height: 71 in
S' Lateral: 4.1 cm
Weight: 4147.2 oz

## 2021-02-02 LAB — HIV ANTIBODY (ROUTINE TESTING W REFLEX): HIV Screen 4th Generation wRfx: NONREACTIVE

## 2021-02-02 MED ORDER — SODIUM ZIRCONIUM CYCLOSILICATE 10 G PO PACK
10.0000 g | PACK | Freq: Once | ORAL | Status: AC
  Administered 2021-02-02: 10 g via ORAL
  Filled 2021-02-02: qty 1

## 2021-02-02 MED ORDER — PERFLUTREN LIPID MICROSPHERE
1.0000 mL | INTRAVENOUS | Status: AC | PRN
  Administered 2021-02-02: 2 mL via INTRAVENOUS
  Filled 2021-02-02: qty 10

## 2021-02-02 NOTE — Progress Notes (Signed)
PROGRESS NOTE  [name] Snow GMW:[id number] DOB: 03/14/1966 DOA: 02/01/2021 PCP: Patient, No Pcp Per (Inactive)   LOS: 1 day   Brief Narrative / Interim history: 55 year old male with history of CAD, chronic kidney disease stage IIIa with baseline creatinine 1.2-1.3, obesity, hypertension, hyperlipidemia, comes into the hospital with complaints of weakness, shortness of breath, lower extremity swelling and difficulty laying flat.  Patient had a recent prolonged hospitalization for about 19 days in [place].  Apparently presented there for infected foot and he is now status post metatarsal amputation.  He was given antibiotics in the setting of infection which led to renal failure and he required HD x1.  He tells me his creatinine was as high as 6.5 when he was hospitalized there and believes that it was around 4 when he left but he is not sure.  That hospital course was also complicated by GI bleed with bright red blood per rectum/melena for which he underwent a GI evaluation with EGD, colonoscopy as well as a capsule study and apparently without a clear-cut source of his bleed.  He required a total of 6 units of packed red blood cells.  He was telling me he was supposed to leave with IV antibiotics and PICC line but did and does not want antibiotics given the fact that they caused his renal failure to begin with.  He is also had a recent STEMI in April, was hospitalized at [facility] and underwent stenting of the proximal and mid RCA with PTCA to the ostial and proximal PDA.  EF was 40-45%.  He also was found to have severe three-vessel disease with severe stenosis of the OM and LAD/D1 and currently is in the process of getting bypass surgery but it was delayed due to his hospitalization  Subjective / 24h Interval events: He is doing well this morning, feels a little bit better.  He denies any chest pain, denies any shortness of breath.  Assessment & Plan: Principal Problem Acute on chronic  combined CHF-patient has been admitted to the hospital for elevated BNP, fluid overload -Has been started on Lasix, continue, fluid restriction, repeated 2D echo pending -Strict ins and outs, daily weights -Most recent EF was 40-45% and a segmental abnormality in the inferior wall  Active Problems Coronary artery disease, severe three-vessel disease-patient established care with Dr. Ty Hilts at [facility] for CABG in the near future. -Currently his CAD appears stable, he has no chest pain or ACS type symptoms. -2D echo pending  Acute kidney injury on chronic kidney disease stage IIIa  -Patient's creatinine on admission is 1.9 from baseline about 1.2-1.4.  He was recently hospitalized earlier this month requiring HD.  Patient recalls creatinine around 4 from his discharge so currently he is 1.9 appears to be better than that. -This may be his new baseline, continue to monitor, appears to be stable with diuresis currently  Hyperkalemia -He tells me this has been a problem also during his prior hospital stay, potassium mildly elevated 5.4 today, give Lokelma x1, continue Lasix  Normocytic anemia, recent GI bleed-fecal occult is positive however he is clinically without bleeding.  Stools are brown and there is no signs of melena/bright red blood.  GI consulted, and did not recommend any further work-up at this point with close monitoring.  Recent foot infection, osteomyelitis-status post metatarsal amputation recently.  Wound care consulted, no odor, drainage, fluctuance or open areas.  His white count is normal and he is afebrile.  Recommend outpatient follow-up with  primary surgeon.  Hyperlipidemia-continue statin  Hypertension-hold ACE inhibitor's   DM2 - A1c, SSI, glucose checks, DM diet  CBG (last 3)  Recent Labs    02/01/21 2259 02/02/21 0745 02/02/21 1021  GLUCAP 142* 126* 187*   Scheduled Meds:  aspirin EC  81 mg Oral Daily   furosemide  40 mg Intravenous Q12H   insulin  aspart  0-15 Units Subcutaneous TID WC   insulin aspart  0-5 Units Subcutaneous QHS   pantoprazole (PROTONIX) IV  40 mg Intravenous Q12H   rosuvastatin  20 mg Oral QPM   Continuous Infusions: PRN Meds:.acetaminophen **OR** acetaminophen, hydrALAZINE, HYDROcodone-acetaminophen, prochlorperazine  Diet Orders (From admission, onward)     Start     Ordered   02/01/21 1157  Diet Carb Modified Fluid consistency: Thin; Room service appropriate? Yes; Fluid restriction: 1500 mL Fluid  Diet effective now       Question Answer Comment  Diet-HS Snack? Nothing   Calorie Level Medium 1600-2000   Fluid consistency: Thin   Room service appropriate? Yes   Fluid restriction: 1500 mL Fluid      02/01/21 1156            DVT prophylaxis: SCDs Start: 02/01/21 1827     Code Status: Full Code  Family Communication: No family at bedside  Status is: Inpatient  Remains inpatient appropriate because:Inpatient level of care appropriate due to severity of illness  Dispo: The patient is from: Home              Anticipated d/c is to: Home              Patient currently is not medically stable to d/c.   Difficult to place patient No  Level of care: Telemetry  Consultants:  GI  Procedures:  2D echo: pending  Microbiology  none  Antimicrobials: none    Objective: Vitals:   02/01/21 1825 02/01/21 2210 02/02/21 0458 02/02/21 0541  BP: (!) 174/82 (!) 165/77  130/65  Pulse: 60 (!) 49  (!) 56  Resp: 20 18  18   Temp: 97.6 F (36.4 C) 98.5 F (36.9 C)  98.2 F (36.8 C)  TempSrc: Oral Oral  Oral  SpO2: 100% 99%  96%  Weight:   117.6 kg   Height:        Intake/Output Summary (Last 24 hours) at 02/02/2021 1118 Last data filed at 02/02/2021 1000 Gross per 24 hour  Intake 480 ml  Output --  Net 480 ml   Filed Weights   02/01/21 0619 02/01/21 1600 02/02/21 0458  Weight: 117.9 kg 117.9 kg 117.6 kg    Examination:  Constitutional: NAD Eyes: no scleral icterus ENMT: Mucous  membranes are moist.  Neck: normal, supple Respiratory: Faint bibasilar crackles, no wheezing, normal respiratory effort Cardiovascular: Regular rate and rhythm, no murmurs / rubs / gallops.  2+ pitting lower extremity edema up to mid thighs Abdomen: non distended, no tenderness. Bowel sounds positive.  Musculoskeletal: no clubbing / cyanosis.  Skin: no rashes Neurologic: CN 2-12 grossly intact. Strength 5/5 in all 4.   Data Reviewed: I have independently reviewed following labs and imaging studies   CBC: Recent Labs  Lab 02/01/21 0646 02/01/21 1913 02/02/21 0236  WBC 10.4  --  9.7  NEUTROABS 7.5  --   --   HGB 8.5* 8.0* 7.9*  HCT 27.3* 25.4* 24.8*  MCV 93.2  --  91.9  PLT 262  --  243   Basic Metabolic Panel: Recent Labs  Lab 02/01/21 0646 02/02/21 0236  NA 136 141  K 5.1 5.4*  CL 103 109  CO2 23 24  GLUCOSE 158* 142*  BUN 125* 121*  CREATININE 1.97* 1.94*  CALCIUM 8.2* 8.3*   Liver Function Tests: Recent Labs  Lab 02/01/21 0646 02/02/21 0236  AST 16 13*  ALT 6 5  ALKPHOS 80 70  BILITOT 1.1 0.7  PROT 8.2* 7.7  ALBUMIN 2.5* 2.3*   Coagulation Profile: No results for input(s): INR, PROTIME in the last 168 hours. HbA1C: Recent Labs    02/01/21 1913  HGBA1C 6.3*   CBG: Recent Labs  Lab 02/01/21 2259 02/02/21 0745 02/02/21 1021  GLUCAP 142* 126* 187*    Recent Results (from the past 240 hour(s))  Resp Panel by RT-PCR (Flu A&B, Covid) Nasopharyngeal Swab     Status: None   Collection Time: 02/01/21  7:42 AM   Specimen: Nasopharyngeal Swab; Nasopharyngeal(NP) swabs in vial transport medium  Result Value Ref Range Status   SARS Coronavirus 2 by RT PCR NEGATIVE NEGATIVE Final    Comment: (NOTE) SARS-CoV-2 target nucleic acids are NOT DETECTED.  The SARS-CoV-2 RNA is generally detectable in upper respiratory specimens during the acute phase of infection. The lowest concentration of SARS-CoV-2 viral copies this assay can detect is 138 copies/mL. A  negative result does not preclude SARS-Cov-2 infection and should not be used as the sole basis for treatment or other patient management decisions. A negative result may occur with  improper specimen collection/handling, submission of specimen other than nasopharyngeal swab, presence of viral mutation(s) within the areas targeted by this assay, and inadequate number of viral copies(<138 copies/mL). A negative result must be combined with clinical observations, patient history, and epidemiological information. The expected result is Negative.  Fact Sheet for Patients:  BloggerCourse.com  Fact Sheet for Healthcare Providers:  SeriousBroker.it  This test is no t yet approved or cleared by the [place] FDA and  has been authorized for detection and/or diagnosis of SARS-CoV-2 by FDA under an Emergency Use Authorization (EUA). This EUA will remain  in effect (meaning this test can be used) for the duration of the COVID-19 declaration under Section 564(b)(1) of the Act, 21 U.S.C.section 360bbb-3(b)(1), unless the authorization is terminated  or revoked sooner.       Influenza A by PCR NEGATIVE NEGATIVE Final   Influenza B by PCR NEGATIVE NEGATIVE Final    Comment: (NOTE) The Xpert Xpress SARS-CoV-2/FLU/RSV plus assay is intended as an aid in the diagnosis of influenza from Nasopharyngeal swab specimens and should not be used as a sole basis for treatment. Nasal washings and aspirates are unacceptable for Xpert Xpress SARS-CoV-2/FLU/RSV testing.  Fact Sheet for Patients: BloggerCourse.com  Fact Sheet for Healthcare Providers: SeriousBroker.it  This test is not yet approved or cleared by the [place] FDA and has been authorized for detection and/or diagnosis of SARS-CoV-2 by FDA under an Emergency Use Authorization (EUA). This EUA will remain in effect (meaning this test can  be used) for the duration of the COVID-19 declaration under Section 564(b)(1) of the Act, 21 U.S.C. section 360bbb-3(b)(1), unless the authorization is terminated or revoked.  Performed at [facility], [address] [place], [place] [address]      Radiology Studies: US RENAL  Result Date: 02/01/2021 CLINICAL DATA:  Acute kidney injury. EXAM: RENAL / URINARY TRACT ULTRASOUND COMPLETE COMPARISON:  Abdominal ultrasound 09/02/2007. CT abdomen and pelvis 07/22/2013. FINDINGS: Right Kidney: Renal measurements: 12.6 x 6.1 x 5.5  cm = volume: 220 mL. Echogenicity within normal limits. No mass or hydronephrosis visualized. Left Kidney: Renal measurements: 11.9 x 5.6 x 6.1 cm = volume: 212 mL. Echogenicity within normal limits. There is mild left-sided hydronephrosis. Bladder: Grossly within normal limits. Not fully distended. Bilateral ureteral jets are not visualized. Other: Left pleural effusion. Technically limited study secondary to body habitus and bowel gas. IMPRESSION: 1. Mild left-sided hydronephrosis. 2. Left pleural effusion. Electronically Signed   By: Darliss Cheney M.D.   On: 02/01/2021 19:10    Pamella Pert, MD, PhD [facility]  Between 7 am - 7 pm I am available, please contact me via Amion (for emergencies) or Securechat (non urgent messages)  Between 7 pm - 7 am I am not available, please contact night coverage MD/APP via Amion

## 2021-02-02 NOTE — Progress Notes (Signed)
PT Cancellation Note  Patient Details Name: MEIR ELWOOD MRN: [id number] DOB: 1966/01/05   Cancelled Treatment:    Reason Eval/Treat Not Completed: PT screened, no needs identified, will sign off, patient is up ad lib, to stay with uncle without steps. No DME needs per pt.    Rada Hay 02/02/2021, 12:06 PM Blanchard Kelch PT Acute Rehabilitation Services Pager [phone] Office [phone]

## 2021-02-02 NOTE — Progress Notes (Signed)
[facility] Gastroenterology Progress Note  [name] Snow 55 y.o. 1965/11/11  CC: Anemia   Subjective: Patient seen and examined at bedside.  Sitting comfortably in the chair.  Denies abdominal pain.  Had some nausea this morning which is chronic according to patient.  Denies any blood in the stool or black stool.  Having brown-colored stool.  ROS : Afebrile, negative for active chest pain   Objective: Vital signs in last 24 hours: Vitals:   02/01/21 2210 02/02/21 0541  BP: (!) 165/77 130/65  Pulse: (!) 49 (!) 56  Resp: 18 18  Temp: 98.5 F (36.9 C) 98.2 F (36.8 C)  SpO2: 99% 96%    Physical Exam:  General:  Alert, cooperative, no distress, appears stated age  Head:  Normocephalic, without obvious abnormality, atraumatic  Eyes:  , EOM's intact,   Lungs:   No visible respiratory distress  Heart:  Bradycardia  Abdomen:   Soft, non-tender, nondistended, bowel sounds present  Neuro Alert and oriented x3  Psych Mood and affect normal    Lab Results: Recent Labs    02/01/21 0646 02/02/21 0236  NA 136 141  K 5.1 5.4*  CL 103 109  CO2 23 24  GLUCOSE 158* 142*  BUN 125* 121*  CREATININE 1.97* 1.94*  CALCIUM 8.2* 8.3*   Recent Labs    02/01/21 0646 02/02/21 0236  AST 16 13*  ALT 6 5  ALKPHOS 80 70  BILITOT 1.1 0.7  PROT 8.2* 7.7  ALBUMIN 2.5* 2.3*   Recent Labs    02/01/21 0646 02/01/21 1913 02/02/21 0236  WBC 10.4  --  9.7  NEUTROABS 7.5  --   --   HGB 8.5* 8.0* 7.9*  HCT 27.3* 25.4* 24.8*  MCV 93.2  --  91.9  PLT 262  --  243   No results for input(s): LABPROT, INR in the last 72 hours.    Assessment/Plan: -Anemia in a patient with multiple medical comorbidities with a negative recent GI work-up.  No overt bleeding. -Recent osteomyelitis of the right foot s/p partial amputation -h/o coronary disease.  Was on Brilinta.  Currently on aspirin only. -CHF exacerbation -improving  Recommendation --------------------- -Hemoglobin stable.  Patient  having brown stools.  Reported recent negative work-up including EGD, colonoscopy and capsule endoscopy.  Records still not available to review. -Recommend to continue supportive care from GI standpoint -Continue twice daily PPI -Okay to continue aspirin from GI standpoint -No inpatient GI work-up planned at this time in absence of overt bleeding and multiple comorbidities. -GI will sign off.  Call us back if needed  Kathi Der MD, FACP 02/02/2021, 9:49 AM  Contact #  [phone]

## 2021-02-03 ENCOUNTER — Inpatient Hospital Stay (HOSPITAL_COMMUNITY): Payer: Medicare Other

## 2021-02-03 DIAGNOSIS — N179 Acute kidney failure, unspecified: Secondary | ICD-10-CM | POA: Diagnosis not present

## 2021-02-03 DIAGNOSIS — I509 Heart failure, unspecified: Secondary | ICD-10-CM | POA: Diagnosis not present

## 2021-02-03 LAB — URINALYSIS, ROUTINE W REFLEX MICROSCOPIC
Bilirubin Urine: NEGATIVE
Glucose, UA: NEGATIVE mg/dL
Ketones, ur: NEGATIVE mg/dL
Nitrite: NEGATIVE
Protein, ur: >=300 mg/dL — AB
Specific Gravity, Urine: 1.02 (ref 1.005–1.030)
pH: 6 (ref 5.0–8.0)

## 2021-02-03 LAB — COMPREHENSIVE METABOLIC PANEL
ALT: 6 U/L (ref 0–44)
AST: 14 U/L — ABNORMAL LOW (ref 15–41)
Albumin: 2.3 g/dL — ABNORMAL LOW (ref 3.5–5.0)
Alkaline Phosphatase: 65 U/L (ref 38–126)
Anion gap: 7 (ref 5–15)
BUN: 106 mg/dL — ABNORMAL HIGH (ref 6–20)
CO2: 27 mmol/L (ref 22–32)
Calcium: 8.2 mg/dL — ABNORMAL LOW (ref 8.9–10.3)
Chloride: 106 mmol/L (ref 98–111)
Creatinine, Ser: 2.05 mg/dL — ABNORMAL HIGH (ref 0.61–1.24)
GFR, Estimated: 37 mL/min — ABNORMAL LOW (ref >60)
Glucose, Bld: 149 mg/dL — ABNORMAL HIGH (ref 70–99)
Potassium: 5.4 mmol/L — ABNORMAL HIGH (ref 3.5–5.1)
Sodium: 140 mmol/L (ref 135–145)
Total Bilirubin: 0.6 mg/dL (ref 0.3–1.2)
Total Protein: 7.4 g/dL (ref 6.5–8.1)

## 2021-02-03 LAB — SODIUM, URINE, RANDOM: Sodium, Ur: 51 mmol/L

## 2021-02-03 LAB — CREATININE, URINE, RANDOM: Creatinine, Urine: 95.32 mg/dL

## 2021-02-03 LAB — TROPONIN I (HIGH SENSITIVITY)
Troponin I (High Sensitivity): 368 ng/L (ref <18)
Troponin I (High Sensitivity): 798 ng/L (ref <18)

## 2021-02-03 LAB — GLUCOSE, CAPILLARY
Glucose-Capillary: 155 mg/dL — ABNORMAL HIGH (ref 70–99)
Glucose-Capillary: 155 mg/dL — ABNORMAL HIGH (ref 70–99)
Glucose-Capillary: 132 mg/dL — ABNORMAL HIGH (ref 70–99)
Glucose-Capillary: 143 mg/dL — ABNORMAL HIGH (ref 70–99)

## 2021-02-03 LAB — CBC
HCT: 24.7 % — ABNORMAL LOW (ref 39.0–52.0)
Hemoglobin: 7.7 g/dL — ABNORMAL LOW (ref 13.0–17.0)
MCH: 28.6 pg (ref 26.0–34.0)
MCHC: 31.2 g/dL (ref 30.0–36.0)
MCV: 91.8 fL (ref 80.0–100.0)
Platelets: 219 10*3/uL (ref 150–400)
RBC: 2.69 MIL/uL — ABNORMAL LOW (ref 4.22–5.81)
RDW: 15.3 % (ref 11.5–15.5)
WBC: 9.5 10*3/uL (ref 4.0–10.5)
nRBC: 0 % (ref 0.0–0.2)

## 2021-02-03 LAB — MAGNESIUM: Magnesium: 2.3 mg/dL (ref 1.7–2.4)

## 2021-02-03 LAB — PREPARE RBC (CROSSMATCH)

## 2021-02-03 MED ORDER — FUROSEMIDE 10 MG/ML IJ SOLN
40.0000 mg | Freq: Once | INTRAMUSCULAR | Status: AC
  Administered 2021-02-03: 40 mg via INTRAVENOUS
  Filled 2021-02-03: qty 4

## 2021-02-03 MED ORDER — SODIUM ZIRCONIUM CYCLOSILICATE 10 G PO PACK
10.0000 g | PACK | Freq: Two times a day (BID) | ORAL | Status: AC
  Administered 2021-02-03 (×2): 10 g via ORAL
  Filled 2021-02-03 (×3): qty 1

## 2021-02-03 MED ORDER — FUROSEMIDE 10 MG/ML IJ SOLN
120.0000 mg | Freq: Three times a day (TID) | INTRAVENOUS | Status: DC
  Administered 2021-02-03 – 2021-02-05 (×6): 120 mg via INTRAVENOUS
  Filled 2021-02-03: qty 2
  Filled 2021-02-03 (×2): qty 10
  Filled 2021-02-03 (×2): qty 2
  Filled 2021-02-03: qty 10
  Filled 2021-02-03: qty 12

## 2021-02-03 MED ORDER — METOLAZONE 5 MG PO TABS
10.0000 mg | ORAL_TABLET | Freq: Every day | ORAL | Status: DC
  Administered 2021-02-03 – 2021-02-08 (×6): 10 mg via ORAL
  Filled 2021-02-03 (×6): qty 2

## 2021-02-03 MED ORDER — PANTOPRAZOLE SODIUM 40 MG PO TBEC
40.0000 mg | DELAYED_RELEASE_TABLET | Freq: Two times a day (BID) | ORAL | Status: DC
  Administered 2021-02-03 – 2021-02-08 (×10): 40 mg via ORAL
  Filled 2021-02-03 (×10): qty 1

## 2021-02-03 MED ORDER — FUROSEMIDE 10 MG/ML IJ SOLN
80.0000 mg | Freq: Two times a day (BID) | INTRAMUSCULAR | Status: DC
  Administered 2021-02-03: 80 mg via INTRAVENOUS
  Filled 2021-02-03: qty 8

## 2021-02-03 MED ORDER — SODIUM CHLORIDE 0.9% IV SOLUTION: Freq: Once | INTRAVENOUS | Status: AC

## 2021-02-03 NOTE — Consult Note (Signed)
Renal Service Consult Note [facility]  [name] Snow 02/03/2021 [name] Blazing, MD Requesting Physician: Dr Cruzita Lederer  Reason for Consult: Renal failure HPI: The patient is a 55 y.o. year-old w/ hx of DJD, CKD, IDDM, gout, HTN PAD w/ hx toe amps on R foot who presented to ED on 9/16 after labs done by PCP showed high kidney numbers. Pt had been hospitalized in April here for R foot osteomyelitis rx'd w/ toe amps and IV abx. Then was in hospital in [place], [place] recently for infected foot wounds. He had AKI and required brief course of dialysis. He left AMA after 19d in hospital. He got home 4-5 days ago, then saw his PCP and labs showed kidney problems so he was sent to ED. Also since dc'd from hospital has had nausea, dry heaves, unable to eat, SOB/ orthopnea and increasing leg edema. In ED 9/16 BNP was 1600 and CXR showed atx vs atypical infection. Pt was admitted for CHF exacerbation and started on IV lasix 40 bid. No UOP recorded since admit. SOB no better, creat 2.0 today. Asked to see for renal failure and fluid overload.   Pt seen in room.  Main c/o is SOB, cannot lie down, which started overnight last night.  Has had DOE for several days. Legs are swollen, hands as well. No hx of this before, denies hx of CHF.  Had STEMI earlier this year, was supposed to have CABG at some point.        ROS - denies CP, no joint pain, no HA, no blurry vision, no rash, no diarrhea, no nausea/ vomiting, no dysuria, no difficulty voiding   Past Medical History  Past Medical History:  Diagnosis Date   Arthritis    gout   Cataract    CHF (congestive heart failure) ([place])    patient denies   Chronic kidney disease    Clotting disorder ([place])    patient denies   Diabetes mellitus    Gout    History of kidney stones    stents placed prior to and removed   Hypertension    Past Surgical History  Past Surgical History:  Procedure Laterality Date   AMPUTATION Right 08/24/2016    Procedure: IRRIGATION AND DEBRIDEMENT OF RIGHT FOOT AND AMPUTATION OF RIGHT FIFTH RAY;  Surgeon: Leandrew Koyanagi, MD;  Location: [facility];  Service: Orthopedics;  Laterality: Right;   I & D EXTREMITY Right 08/27/2016   Procedure: IRRIGATION AND DEBRIDEMENT RIGHT FOOT, VAC,  4TH RAY AMPUTATION;  Surgeon: Leandrew Koyanagi, MD;  Location: [place];  Service: Orthopedics;  Laterality: Right;   I & D EXTREMITY Right 08/29/2016   Procedure: IRRIGATION AND DEBRIDEMENT EXTREMITY WITH ACELL APPLICATION;  Surgeon: Leandrew Koyanagi, MD;  Location: [place];  Service: Orthopedics;  Laterality: Right;   I & D EXTREMITY Right 09/04/2016   Procedure: IRRIGATION AND DEBRIDEMENT RIGHT FOOT, VAC SPONGE REPLACEMENT;  Surgeon: Leandrew Koyanagi, MD;  Location: [place];  Service: Orthopedics;  Laterality: Right;   JOINT REPLACEMENT  [id number]   R Hip   kidney stents     Family History  Family History  Problem Relation Age of Onset   Mental illness Mother    Hypertension Mother    Hyperlipidemia Mother    Mental illness Father    COPD Father    Mental illness Brother    Social History  reports that he has never smoked. He has never used smokeless tobacco. He reports that  he does not drink alcohol and does not use drugs. Allergies  Allergies  Allergen Reactions   No Known Allergies    Home medications Prior to Admission medications   Medication Sig Start Date End Date Taking? Authorizing Provider  aspirin 81 MG EC tablet Take 81 mg by mouth daily. 08/26/20  Yes [provider]  clotrimazole-betamethasone (LOTRISONE) cream Apply 1 application topically 2 (two) times daily. Patient taking differently: Apply 1 application topically 2 (two) times daily as needed (dry skin). 09/29/17  Yes Weber, Sarah L, PA-C  Dulaglutide (TRULICITY) 1.5 BL/3.9QZ SOPN Inject 1.5 Units into the skin once a week. 01/01/18  Yes Weber, Sarah L, PA-C  glipiZIDE (GLUCOTROL) 10 MG tablet Take 10 mg by mouth daily as needed (if glucose above 180). 05/28/20  Yes  [provider]  HYDROcodone-acetaminophen (NORCO) 10-325 MG tablet Take 1 tablet by mouth every 4 (four) hours as needed. 03/03/18  Yes Weber, Sarah L, PA-C  rosuvastatin (CRESTOR) 20 MG tablet Take 1 tablet (20 mg total) by mouth every evening. 01/01/18  Yes Weber, Sarah L, PA-C  tiZANidine (ZANAFLEX) 4 MG tablet Take 1 tablet (4 mg total) by mouth every 6 (six) hours as needed for muscle spasms. 02/03/18  Yes Weber, Sarah L, PA-C  blood glucose meter kit and supplies KIT Dispense based on insurance preference. Use bid as directed. (FOR ICD-9 250.00) 09/29/17   Weber, Damaris Hippo, PA-C  Continuous Blood Gluc Sensor (FREESTYLE LIBRE 14 DAY SENSOR) MISC 1 Device by Does not apply route every 14 (fourteen) days. 11/26/17   Weber, Damaris Hippo, PA-C  febuxostat (ULORIC) 40 MG tablet Take 1 tablet (40 mg total) by mouth daily. Pt failed allopurinol Patient not taking: Reported on 02/01/2021 02/03/18   Gale Journey, Damaris Hippo, PA-C  gabapentin (NEURONTIN) 400 MG capsule 1 po qam, 2 po q afternon and 2 po qhs Patient not taking: Reported on 02/01/2021 01/01/18   Mancel Bale, PA-C  glucose blood test strip Use as instructed 11/04/16   Weber, Damaris Hippo, PA-C  HYDROcodone-acetaminophen (NORCO) 10-325 MG tablet Take 1 tablet by mouth every 4 (four) hours as needed. Patient not taking: Reported on 02/01/2021 02/01/18   Gale Journey, Damaris Hippo, PA-C  HYDROcodone-acetaminophen [facility]) 10-325 MG tablet Take 1 tablet by mouth every 4 (four) hours as needed. Patient not taking: Reported on 02/01/2021 04/03/18   Gale Journey, Damaris Hippo, PA-C  Insulin Glargine (LANTUS SOLOSTAR) 100 UNIT/ML Solostar Pen Inject 36 Units into the skin 2 (two) times daily. Patient not taking: Reported on 02/01/2021 01/01/18   Mancel Bale, PA-C  Lancets MISC 1 Device by Does not apply route 3 (three) times daily. E11.40 11/04/16   Weber, Damaris Hippo, PA-C  lisinopril (PRINIVIL,ZESTRIL) 10 MG tablet Take 0.5 tablets (5 mg total) by mouth daily. Patient not taking: Reported on  02/01/2021 01/01/18   Mancel Bale, PA-C     Vitals:   02/02/21 0458 02/02/21 0541 02/02/21 1324 02/02/21 2117  BP:  130/65 (!) 176/77 (!) 188/87  Pulse:  (!) 56 64 62  Resp:  $Remo'18 17 18  'FCdyF$ Temp:  98.2 F (36.8 C) 97.7 F (36.5 C) 98.9 F (37.2 C)  TempSrc:  Oral Oral Oral  SpO2:  96% 96% 94%  Weight: 117.6 kg     Height:       Exam Gen alert, not in distress No rash, cyanosis or gangrene Sclera anicteric, throat clear  No jvd or bruits Chest bilat rales 1/2 up post, no wheezing RRR no  MRG Abd soft ntnd no mass or ascites +bs GU normal MS no joint effusions or deformity Ext 2+ pitting pretib edema bilat, 1+ hip/ waist edema  Bilat TMA wounds are dressed  Neuro is alert, Ox 3 , nf     Home meds include asa, trulicity, glucotrol, norco prn, crestor, zanaflex prn, uloric, neurontin, lantus insulin 36 bid, lisiniopril $RemoveBeforeDE'5mg'iOOHBtdBYkMUDrB$  qd    Date   Creat  eGFR   2009- 2015  0.90- 1.21   2016- 2017  1.02- 1.48   2018   1.15- 1.64   2019   1.12- 1.30 62- 71   Sept 16, 2022 1.97  39   Sept 17  1.94  40    Sept 18  2.05  37      Renal US - 12.6 / 11.9 cm kidneys. Mild L hydro, bladder not distended, bilat jets not seen.     UA pend    UNa, UCr pending     Na 140  K 5.4  CO2 27  BUN 106  Cr 2.05  Ca 8.2  Alb 2.3      WBC 9K Hb 7.7, mcv 92    CXR 9/18 today - significant bilat CHF (official reading pend)    Assessment/ Plan: AKI - b/l creat in 2019 was 1.12- 1.30, eGFR 62-71. Creat here 1.97 3 yrs later. Pt marked vol overload w/ pulm involvement by CXR/ exam. Renal US shows mild L hydro, nothing on R. UA pending. Suspect cardiorenal syndrome may be contributing. Also likely has progressed to CKD 3b. Needs diuresis.  Will ^IV lasix to 120 tid and add zaroxolyn 10 qd.  Get urine lytes. Would move to SDU. Will follow.  PAD sp bilat TMA - recently in [place] H/o kidney stones - has had stents placed / removed in the past HTN - hold lisinopril for now IDDM - per pmd Volume - vol  overload as above Anemia - Hb 7- 8 range, not sure cause    Kelly Splinter  MD 02/03/2021, 11:32 AM  Recent Labs  Lab 02/02/21 0236 02/03/21 0553  WBC 9.7 9.5  HGB 7.9* 7.7*   Recent Labs  Lab 02/02/21 0236 02/03/21 0553  K 5.4* 5.4*  BUN 121* 106*  CREATININE 1.94* 2.05*  CALCIUM 8.3* 8.2*

## 2021-02-03 NOTE — Progress Notes (Signed)
PROGRESS NOTE  [name] Snow ZOX:[id number] DOB: May 02, 1966 DOA: 02/01/2021 PCP: Patient, No Pcp Per (Inactive)   LOS: 2 days   Brief Narrative / Interim history: 55 year old male with history of CAD, chronic kidney disease stage IIIa with baseline creatinine 1.2-1.3, obesity, hypertension, hyperlipidemia, comes into the hospital with complaints of weakness, shortness of breath, lower extremity swelling and difficulty laying flat.  Patient had a recent prolonged hospitalization for about 19 days in [place].  Apparently presented there for infected foot and he is now status post metatarsal amputation.  He was given antibiotics in the setting of infection which led to renal failure and he required HD x1.  He tells me his creatinine was as high as 6.5 when he was hospitalized there and believes that it was around 4 when he left but he is not sure.  That hospital course was also complicated by GI bleed with bright red blood per rectum/melena for which he underwent a GI evaluation with EGD, colonoscopy as well as a capsule study and apparently without a clear-cut source of his bleed.  He required a total of 6 units of packed red blood cells.  He was telling me he was supposed to leave with IV antibiotics and PICC line but did and does not want antibiotics given the fact that they caused his renal failure to begin with.  He is also had a recent STEMI in April, was hospitalized at [facility] and underwent stenting of the proximal and mid RCA with PTCA to the ostial and proximal PDA.  EF was 40-45%.  He also was found to have severe three-vessel disease with severe stenosis of the OM and LAD/D1 and currently is in the process of getting bypass surgery but it was delayed due to his hospitalization  Subjective / 24h Interval events: Complains of significant shortness of breath today.  Tells me he is barely able to walk and cannot lay flat at all.  Feels like his hands are swollen today more so than  yesterday  Assessment & Plan: Principal Problem Acute on chronic combined CHF-patient has been admitted to the hospital for elevated BNP, fluid overload -2D echo done 9/17 showed EF 50-55%, low normal, as well as regional WMA.  It also showed mild LVH, normal RV. -Weight seems to be going up despite Lasix, increase to 80 twice daily -Strict ins and outs, daily weights -Repeat chest x-ray today as he is more short of breath  Active Problems Coronary artery disease, severe three-vessel disease-patient established care with Dr. Ty Hilts at [facility] for CABG in the near future. -Currently his CAD appears stable, he has no chest pain or ACS type symptoms.  Due to worsening shortness of breath we will check a troponin.  He also has had some back pain  Acute kidney injury on chronic kidney disease stage IIIa  -Patient's creatinine on admission is 1.9 from baseline about 1.2-1.4.  He was recently hospitalized earlier this month requiring HD.  Patient recalls creatinine around 4 from his discharge but up to 6 at one-point -Creatinine slightly up today to 2.0, he is more fluid overloaded and remains hyperkalemic.  Increase Lasix to 80 twice daily, consulted nephrology as well, discussed with Dr. Arlean Hopping  Hyperkalemia -He tells me this has been a problem also during his prior hospital stay, potassium remains elevated today, continue Lokelma  Normocytic anemia, recent GI bleed-fecal occult is positive however he is clinically without bleeding.  Stools are brown and there is no signs of  melena/bright red blood.  GI consulted, and did not recommend any further work-up at this point with close monitoring. -Closely monitor hemoglobin, trending down  Recent foot infection, osteomyelitis-status post metatarsal amputation recently.  Wound care consulted, no odor, drainage, fluctuance or open areas.  His white count is normal and he is afebrile.  Recommend outpatient follow-up with primary  surgeon.  Hyperlipidemia-continue statin  Hypertension-hold ACE inhibitor's   DM2 - A1c, SSI, glucose checks, DM diet  CBG (last 3)  Recent Labs    02/02/21 1615 02/02/21 2118 02/03/21 0751  GLUCAP 148* 140* 155*    Scheduled Meds:  aspirin EC  81 mg Oral Daily   furosemide  80 mg Intravenous Q12H   insulin aspart  0-15 Units Subcutaneous TID WC   insulin aspart  0-5 Units Subcutaneous QHS   pantoprazole (PROTONIX) IV  40 mg Intravenous Q12H   rosuvastatin  20 mg Oral QPM   sodium zirconium cyclosilicate  10 g Oral BID   Continuous Infusions: PRN Meds:.acetaminophen **OR** acetaminophen, hydrALAZINE, HYDROcodone-acetaminophen, prochlorperazine  Diet Orders (From admission, onward)     Start     Ordered   02/01/21 1157  Diet Carb Modified Fluid consistency: Thin; Room service appropriate? Yes; Fluid restriction: 1500 mL Fluid  Diet effective now       Question Answer Comment  Diet-HS Snack? Nothing   Calorie Level Medium 1600-2000   Fluid consistency: Thin   Room service appropriate? Yes   Fluid restriction: 1500 mL Fluid      02/01/21 1156            DVT prophylaxis: SCDs Start: 02/01/21 1827     Code Status: Full Code  Family Communication: No family at bedside  Status is: Inpatient  Remains inpatient appropriate because:Inpatient level of care appropriate due to severity of illness  Dispo: The patient is from: Home              Anticipated d/c is to: Home              Patient currently is not medically stable to d/c.   Difficult to place patient No  Level of care: Telemetry  Consultants:  GI  Procedures:  2D echo: pending  Microbiology  none  Antimicrobials: none    Objective: Vitals:   02/02/21 0458 02/02/21 0541 02/02/21 1324 02/02/21 2117  BP:  130/65 (!) 176/77 (!) 188/87  Pulse:  (!) 56 64 62  Resp:  Temp:  98.2 F (36.8 C) 97.7 F (36.5 C) 98.9 F (37.2 C)  TempSrc:  Oral Oral Oral  SpO2:  96% 96% 94%   Weight: 117.6 kg     Height:        Intake/Output Summary (Last 24 hours) at 02/03/2021 1122 Last data filed at 02/02/2021 1800 Gross per 24 hour  Intake 720 ml  Output --  Net 720 ml    Filed Weights   02/01/21 0619 02/01/21 1600 02/02/21 0458  Weight: 117.9 kg 117.9 kg 117.6 kg    Examination:  Constitutional: Appears uncomfortable, sitting in the chair Eyes: Anicteric ENMT: mmm Neck: normal, supple Respiratory: Faint bibasilar crackles, no wheezing Cardiovascular: Regular rate and rhythm, 2+ pitting edema up to mid thighs Abdomen: Nondistended, no tenderness, bowel sounds positive Musculoskeletal: no clubbing / cyanosis.  Skin: No rashes appreciated Neurologic: Nonfocal, ambulatory  Data Reviewed: I have independently reviewed following labs and imaging studies   CBC: Recent Labs  Lab 02/01/21 0646 02/01/21 1913 02/02/21 0236  02/03/21 0553  WBC 10.4  --  9.7 9.5  NEUTROABS 7.5  --   --   --   HGB 8.5* 8.0* 7.9* 7.7*  HCT 27.3* 25.4* 24.8* 24.7*  MCV 93.2  --  91.9 91.8  PLT 262  --  243 219    Basic Metabolic Panel: Recent Labs  Lab 02/01/21 0646 02/02/21 0236 02/03/21 0553  NA 136 141 140  K 5.1 5.4* 5.4*  CL 103 109 106  CO2 23 24 27   GLUCOSE 158* 142* 149*  BUN 125* 121* 106*  CREATININE 1.97* 1.94* 2.05*  CALCIUM 8.2* 8.3* 8.2*  MG  --   --  2.3    Liver Function Tests: Recent Labs  Lab 02/01/21 0646 02/02/21 0236 02/03/21 0553  AST 16 13* 14*  ALT 6 5 6   ALKPHOS 80 70 65  BILITOT 1.1 0.7 0.6  PROT 8.2* 7.7 7.4  ALBUMIN 2.5* 2.3* 2.3*    Coagulation Profile: No results for input(s): INR, PROTIME in the last 168 hours. HbA1C: Recent Labs    02/01/21 1913  HGBA1C 6.3*    CBG: Recent Labs  Lab 02/02/21 0745 02/02/21 1021 02/02/21 1615 02/02/21 2118 02/03/21 0751  GLUCAP 126* 187* 148* 140* 155*     Recent Results (from the past 240 hour(s))  Resp Panel by RT-PCR (Flu A&B, Covid) Nasopharyngeal Swab     Status:  None   Collection Time: 02/01/21  7:42 AM   Specimen: Nasopharyngeal Swab; Nasopharyngeal(NP) swabs in vial transport medium  Result Value Ref Range Status   SARS Coronavirus 2 by RT PCR NEGATIVE NEGATIVE Final    Comment: (NOTE) SARS-CoV-2 target nucleic acids are NOT DETECTED.  The SARS-CoV-2 RNA is generally detectable in upper respiratory specimens during the acute phase of infection. The lowest concentration of SARS-CoV-2 viral copies this assay can detect is 138 copies/mL. A negative result does not preclude SARS-Cov-2 infection and should not be used as the sole basis for treatment or other patient management decisions. A negative result may occur with  improper specimen collection/handling, submission of specimen other than nasopharyngeal swab, presence of viral mutation(s) within the areas targeted by this assay, and inadequate number of viral copies(<138 copies/mL). A negative result must be combined with clinical observations, patient history, and epidemiological information. The expected result is Negative.  Fact Sheet for Patients:  02/05/21  Fact Sheet for Healthcare Providers:  02/03/21  This test is no t yet approved or cleared by the [employer] FDA and  has been authorized for detection and/or diagnosis of SARS-CoV-2 by FDA under an Emergency Use Authorization (EUA). This EUA will remain  in effect (meaning this test can be used) for the duration of the COVID-19 declaration under Section 564(b)(1) of the Act, 21 U.S.C.section 360bbb-3(b)(1), unless the authorization is terminated  or revoked sooner.       Influenza A by PCR NEGATIVE NEGATIVE Final   Influenza B by PCR NEGATIVE NEGATIVE Final    Comment: (NOTE) The Xpert Xpress SARS-CoV-2/FLU/RSV plus assay is intended as an aid in the diagnosis of influenza from Nasopharyngeal swab specimens and should not be used as a sole basis for  treatment. Nasal washings and aspirates are unacceptable for Xpert Xpress SARS-CoV-2/FLU/RSV testing.  Fact Sheet for Patients: [employer]  Fact Sheet for Healthcare Providers: [place]  This test is not yet approved or cleared by the [employer] FDA and has been authorized for detection and/or diagnosis of SARS-CoV-2 by FDA under an Emergency Use Authorization (EUA).  This EUA will remain in effect (meaning this test can be used) for the duration of the COVID-19 declaration under Section 564(b)(1) of the Act, 21 U.S.C. section 360bbb-3(b)(1), unless the authorization is terminated or revoked.  Performed at [facility], [address], [place], [place] [address]       Radiology Studies: ECHOCARDIOGRAM COMPLETE  Result Date: 02/02/2021    ECHOCARDIOGRAM REPORT   Patient Name:   ISMAEL KARGE Date of Exam: 02/02/2021 Medical Rec #:  [id number]       Height:       71.0 in Accession #:    [id number]      Weight:       259.2 lb Date of Birth:  12/25/1965       BSA:          2.354 m Patient Age:    55 years        BP:           176/77 mmHg Patient Gender: M               HR:           68 bpm. Exam Location:  Inpatient Procedure: 2D Echo, Cardiac Doppler, Color Doppler and Intracardiac            Opacification Agent Indications:    CHF-acute diastolic  History:        Patient has no prior history of Echocardiogram examinations.                 Previous Myocardial Infarction and CAD; Risk Factors:Morbid                 obesity. MI in April awaiting CABG with [facility]. H/O sepsis.                 Prior echo in care everywhere.  Sonographer:    Roosvelt Maser RDCS Referring Phys: [id number] Teddy Spike IMPRESSIONS  1. Left ventricular ejection fraction, by estimation, is 50 to 55%. The left ventricle has low normal function. The left ventricle demonstrates regional wall motion abnormalities (see scoring diagram/findings  for description). There is mild left ventricular hypertrophy. Left ventricular diastolic parameters were normal.  2. Right ventricular systolic function is normal. The right ventricular size is normal. Tricuspid regurgitation signal is inadequate for assessing PA pressure.  3. Left atrial size was severely dilated.  4. Right atrial size was mildly dilated.  5. The mitral valve is grossly normal. Mild mitral valve regurgitation.  6. The aortic valve is tricuspid. Aortic valve regurgitation is not visualized. No aortic stenosis is present. Aortic valve mean gradient measures 4.0 mmHg.  7. The inferior vena cava is dilated in size with >50% respiratory variability, suggesting right atrial pressure of 8 mmHg. Comparison(s): No prior Echocardiogram. FINDINGS  Left Ventricle: Left ventricular ejection fraction, by estimation, is 50 to 55%. The left ventricle has low normal function. The left ventricle demonstrates regional wall motion abnormalities. Definity contrast agent was given IV to delineate the left ventricular endocardial borders. The left ventricular internal cavity size was normal in size. There is mild left ventricular hypertrophy. Left ventricular diastolic parameters were normal.  LV Wall Scoring: The mid and distal anterior septum, apical anterior segment, and basal inferior segment are hypokinetic. The anterior wall, entire lateral wall, mid and distal inferior wall, basal anteroseptal segment, mid inferoseptal segment, basal inferoseptal segment, and apex are normal. Right Ventricle: The right ventricular size is normal. No increase in  right ventricular wall thickness. Right ventricular systolic function is normal. Tricuspid regurgitation signal is inadequate for assessing PA pressure. Left [facility]: Left atrial size was severely dilated. Right [facility]: Right atrial size was mildly dilated. Pericardium: There is no evidence of pericardial effusion. Mitral Valve: The mitral valve is grossly normal. Mild  mitral valve regurgitation. Tricuspid Valve: The tricuspid valve is grossly normal. Tricuspid valve regurgitation is trivial. Aortic Valve: The aortic valve is tricuspid. There is mild aortic valve annular calcification. Aortic valve regurgitation is not visualized. No aortic stenosis is present. Aortic valve mean gradient measures 4.0 mmHg. Aortic valve peak gradient measures 8.2 mmHg. Aortic valve area, by VTI measures 3.04 cm. Pulmonic Valve: The pulmonic valve was grossly normal. Pulmonic valve regurgitation is trivial. Aorta: The aortic root is normal in size and structure. Venous: The inferior vena cava is dilated in size with greater than 50% respiratory variability, suggesting right atrial pressure of 8 mmHg. IAS/Shunts: No atrial level shunt detected by color flow Doppler.  LEFT VENTRICLE PLAX 2D LVIDd:         5.60 cm  Diastology LVIDs:         4.10 cm  LV e' medial:    6.74 cm/s LV PW:         1.20 cm  LV E/e' medial:  13.4 LV IVS:        0.80 cm  LV e' lateral:   11.15 cm/s LVOT diam:     2.20 cm  LV E/e' lateral: 8.1 LV SV:         105 LV SV Index:   45 LVOT Area:     3.80 cm  RIGHT VENTRICLE          IVC RV Basal diam:  4.60 cm  IVC diam: 2.40 cm RV Mid diam:    3.90 cm LEFT ATRIUM              Index       RIGHT ATRIUM           Index LA diam:        5.00 cm  2.12 cm/m  RA Area:     26.30 cm LA Vol (A2C):   116.0 ml 49.27 ml/m RA Volume:   87.50 ml  37.17 ml/m LA Vol (A4C):   124.0 ml 52.67 ml/m LA Biplane Vol: 122.0 ml 51.82 ml/m  AORTIC VALVE AV Area (Vmax):    3.22 cm AV Area (Vmean):   3.04 cm AV Area (VTI):     3.04 cm AV Vmax:           143.00 cm/s AV Vmean:          97.000 cm/s AV VTI:            0.346 m AV Peak Grad:      8.2 mmHg AV Mean Grad:      4.0 mmHg LVOT Vmax:         121.00 cm/s LVOT Vmean:        77.700 cm/s LVOT VTI:          0.277 m LVOT/AV VTI ratio: 0.80  AORTA Ao Root diam: 3.30 cm Ao Asc diam:  3.60 cm MITRAL VALVE MV Area (PHT): 4.80 cm    SHUNTS MV Decel Time: 158  msec    Systemic VTI:  0.28 m MV E velocity: 90.40 cm/s  Systemic Diam: 2.20 cm MV A velocity: 54.40 cm/s MV E/A ratio:  1.66 Nona Dell MD Electronically signed  by Nona Dell MD Signature Date/Time: 02/02/2021/3:36:46 PM    Final     Pamella Pert, MD, PhD [facility]  Between 7 am - 7 pm I am available, please contact me via Amion (for emergencies) or Securechat (non urgent messages)  Between 7 pm - 7 am I am not available, please contact night coverage MD/APP via Amion

## 2021-02-04 ENCOUNTER — Inpatient Hospital Stay (HOSPITAL_COMMUNITY): Payer: Medicare Other

## 2021-02-04 DIAGNOSIS — I509 Heart failure, unspecified: Secondary | ICD-10-CM | POA: Diagnosis not present

## 2021-02-04 DIAGNOSIS — N179 Acute kidney failure, unspecified: Secondary | ICD-10-CM | POA: Diagnosis not present

## 2021-02-04 LAB — COMPREHENSIVE METABOLIC PANEL
ALT: 6 U/L (ref 0–44)
AST: 19 U/L (ref 15–41)
Albumin: 2.5 g/dL — ABNORMAL LOW (ref 3.5–5.0)
Alkaline Phosphatase: 60 U/L (ref 38–126)
Anion gap: 10 (ref 5–15)
BUN: 100 mg/dL — ABNORMAL HIGH (ref 6–20)
CO2: 24 mmol/L (ref 22–32)
Calcium: 8.2 mg/dL — ABNORMAL LOW (ref 8.9–10.3)
Chloride: 105 mmol/L (ref 98–111)
Creatinine, Ser: 1.98 mg/dL — ABNORMAL HIGH (ref 0.61–1.24)
GFR, Estimated: 39 mL/min — ABNORMAL LOW (ref >60)
Glucose, Bld: 161 mg/dL — ABNORMAL HIGH (ref 70–99)
Potassium: 4.4 mmol/L (ref 3.5–5.1)
Sodium: 139 mmol/L (ref 135–145)
Total Bilirubin: 1.4 mg/dL — ABNORMAL HIGH (ref 0.3–1.2)
Total Protein: 7.5 g/dL (ref 6.5–8.1)

## 2021-02-04 LAB — TYPE AND SCREEN
ABO/RH(D): A POS
Antibody Screen: NEGATIVE
Unit division: 0

## 2021-02-04 LAB — CBC
HCT: 26.5 % — ABNORMAL LOW (ref 39.0–52.0)
Hemoglobin: 8.5 g/dL — ABNORMAL LOW (ref 13.0–17.0)
MCH: 28.4 pg (ref 26.0–34.0)
MCHC: 32.1 g/dL (ref 30.0–36.0)
MCV: 88.6 fL (ref 80.0–100.0)
Platelets: 205 10*3/uL (ref 150–400)
RBC: 2.99 MIL/uL — ABNORMAL LOW (ref 4.22–5.81)
RDW: 16.5 % — ABNORMAL HIGH (ref 11.5–15.5)
WBC: 11 10*3/uL — ABNORMAL HIGH (ref 4.0–10.5)
nRBC: 0 % (ref 0.0–0.2)

## 2021-02-04 LAB — BPAM RBC
Blood Product Expiration Date: 202210142359
ISSUE DATE / TIME: 202209181839
Unit Type and Rh: 6200

## 2021-02-04 LAB — GLUCOSE, CAPILLARY
Glucose-Capillary: 165 mg/dL — ABNORMAL HIGH (ref 70–99)
Glucose-Capillary: 206 mg/dL — ABNORMAL HIGH (ref 70–99)
Glucose-Capillary: 133 mg/dL — ABNORMAL HIGH (ref 70–99)
Glucose-Capillary: 140 mg/dL — ABNORMAL HIGH (ref 70–99)

## 2021-02-04 LAB — TROPONIN I (HIGH SENSITIVITY): Troponin I (High Sensitivity): 1283 ng/L (ref <18)

## 2021-02-04 MED ORDER — ISOSORBIDE MONONITRATE ER 30 MG PO TB24
30.0000 mg | ORAL_TABLET | Freq: Every day | ORAL | Status: DC
  Administered 2021-02-04 – 2021-02-05 (×2): 30 mg via ORAL
  Filled 2021-02-04 (×2): qty 1

## 2021-02-04 MED ORDER — METOPROLOL SUCCINATE ER 50 MG PO TB24
50.0000 mg | ORAL_TABLET | Freq: Every day | ORAL | Status: DC
  Administered 2021-02-04 – 2021-02-08 (×5): 50 mg via ORAL
  Filled 2021-02-04 (×5): qty 1

## 2021-02-04 NOTE — Progress Notes (Signed)
RN notified MD of pt's 3am morning troponin level, see new orders

## 2021-02-04 NOTE — Progress Notes (Signed)
[facility] Progress Note  Subjective: seen in room, not recording all UOP, voided about 2 quarts last 4 hrs, wt's are down 4.5 lbs. Breathing better today, still orthopnea.   Vitals:   02/03/21 2139 02/04/21 0140 02/04/21 0443 02/04/21 0447  BP: (!) 164/88 (!) 142/77 (!) 163/92   Pulse:  89 90   Resp: 20  20   Temp: 98.4 F (36.9 C) 98.5 F (36.9 C) 98.4 F (36.9 C)   TempSrc: Oral Oral Oral   SpO2: 97% 96% 96%   Weight:    116.8 kg  Height:        Exam: Gen alert, not in distress No jvd or bruits Chest rales bilat bases RRR no MRG Abd soft ntnd no mass or ascites +bs Ext 2+ pitting pretib edema bilat  Bilat TMA wounds are dressed  Neuro is alert, Ox 3 , nf      Date                           Creat               eGFR   2009- 2015                0.90- 1.21   2016- 2017                1.02- 1.48   2018                          1.15- 1.64   2019                          1.12- 1.30        62- 71   Sept 16, 2022           1.97                 39   Sept 17                      1.94                 40    Sept 18                     2.05                 37      Home meds include asa, trulicity, glucotrol, norco prn, crestor, zanaflex prn, uloric, neurontin, lantus insulin 36 bid, lisiniopril $RemoveBeforeDE'5mg'[id number]$  qd     Renal US - 12.6 / 11.9 cm kidneys. Mild L hydro, bladder not distended, bilat jets not seen.     UA prot >300, 0-5 rbc/ wbc    UNa 51, UCr 95    CXR 9/18 today - significant bilat CHF (official reading pend)       Assessment/ Plan: AKI - b/l creat in 2019 was 1.12- 1.30, eGFR 62-71. Creat here 1.9 three yrs later in setting of marked vol overload w/ pulm edema/ SOB/ LE edema. Renal US shows mild L hydro, nothing on R. Hx of multiple kidney stones over the years. UA +proteinuria, urine lytes c/w CKD.  Suspect CKD now and probably cardiorenal syndrome. Cardiology is evalutating. Seems to be responding to high dose IV lasix w/ metolazone, breathing better today.  Was not on diuretics at home  prior to this, will need maintenance diuretics upon dc. Will cont diuresis for now, still has sig vol excess. He will try to collect UOP for staff recording.  CAD - recently dx'd by [facility]. Has cardiologist and PCP at [place]. Cardiology consulting here.  PAD sp bilat TMA - recently in [place] H/o kidney stones - has had stents placed / removed in the past on the L side HTN - holding lisinopril for now IDDM - per pmd Volume - vol overload as above Anemia - Hb 7- 8 range, sp massive GI bleed during recent hosp stay in [place] 02/04/2021, 1:20 PM   Recent Labs  Lab 02/03/21 0553 02/04/21 0315  K 5.4* 4.4  BUN 106* 100*  CREATININE 2.05* 1.98*  CALCIUM 8.2* 8.2*  HGB 7.7* 8.5*   Inpatient medications:  aspirin EC  81 mg Oral Daily   insulin aspart  0-15 Units Subcutaneous TID WC   insulin aspart  0-5 Units Subcutaneous QHS   isosorbide mononitrate  30 mg Oral Daily   metolazone  10 mg Oral Daily   metoprolol succinate  50 mg Oral Daily   pantoprazole  40 mg Oral BID   rosuvastatin  20 mg Oral QPM    furosemide 120 mg (02/04/21 1301)   acetaminophen **OR** acetaminophen, hydrALAZINE, HYDROcodone-acetaminophen, prochlorperazine

## 2021-02-04 NOTE — Plan of Care (Signed)
  Problem: Health Behavior/Discharge Planning: Goal: Ability to manage health-related needs will improve Outcome: Progressing   Problem: Clinical Measurements: Goal: Respiratory complications will improve Outcome: Progressing   Problem: Clinical Measurements: Goal: Cardiovascular complication will be avoided Outcome: Progressing   Problem: Nutrition: Goal: Adequate nutrition will be maintained Outcome: Progressing   Problem: Pain Managment: Goal: General experience of comfort will improve Outcome: Progressing   Problem: Skin Integrity: Goal: Risk for impaired skin integrity will decrease Outcome: Progressing

## 2021-02-04 NOTE — Consult Note (Signed)
CARDIOLOGY CONSULT NOTE  Patient ID: [name] Snow MRN: [id number] DOB/AGE: [birth year] 55 y.o.  Admit date: 02/01/2021 Referring Physician  Myer Haff, MD Primary Physician:  Patient, No Pcp Per (Inactive) Reason for Consultation  NSTEMI, CHF  Patient ID: [name] Snow, male    DOB: 1965-06-20, 55 y.o.   MRN: [id number]  Chief Complaint  Patient presents with   Abnormal Lab  Shortness of breath  HPI:    [name] Snow  is a 55 y.o. 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  Patient had discontinued dual antiplatelet therapy 1 week prior to his surgery for transmetatarsal amputation.  He is presently on aspirin alone.  Cardiac history includes myocardial infarction and DES placement to occluded mid and proximal RCA on 08/22/2020 when he presented with STEMI.  He has residual mid circumflex and mid LAD stenosis and in view of noncompliance, diabetic state, was recommended CABG which never occurred.  While in [place] during hospitalization from 8/24 through 01/27/2021, patient walked out AMA 6 days ago Enterobacter [place], he was called by his PCP to go back to the emergency room 2 days ago due to worsening renal function.  He had noticed worsening dyspnea and leg edema prior to the presentation to the emergency room.  He is now referred to [place] for evaluation and management of heart failure.  States that his dyspnea has improved since hospital admission and leg edema is also improved.  He is now able to breathe easily however still not able to lay down.  He has not had any recurrence of anginal-like symptoms since angioplasty.  No further GI bleed or dark stools or bloody stools.   No hematemesis.  Past Medical History:  Diagnosis Date   Arthritis    gout   Cataract    CHF (congestive heart failure) (HCC)    patient denies   Chronic kidney disease    Clotting disorder (HCC)    patient denies   Diabetes mellitus    Gout    History of kidney stones    stents placed prior to and removed   Hypertension    Past Surgical History:  Procedure Laterality Date   AMPUTATION Right 08/24/2016   Procedure: IRRIGATION AND DEBRIDEMENT OF RIGHT FOOT AND AMPUTATION OF RIGHT FIFTH RAY;  Surgeon: Tarry Kos, MD;  Location: [facility];  Service: Orthopedics;  Laterality: Right;   I & D EXTREMITY Right 08/27/2016   Procedure: IRRIGATION AND DEBRIDEMENT RIGHT FOOT, VAC,  4TH RAY AMPUTATION;  Surgeon: Tarry Kos, MD;  Location: [facility] OR;  Service: Orthopedics;  Laterality: Right;   I & D EXTREMITY Right 08/29/2016   Procedure: IRRIGATION AND DEBRIDEMENT EXTREMITY WITH ACELL APPLICATION;  Surgeon: Tarry Kos, MD;  Location: [facility] OR;  Service: Orthopedics;  Laterality: Right;   I & D EXTREMITY Right 09/04/2016   Procedure: IRRIGATION AND DEBRIDEMENT RIGHT FOOT, VAC SPONGE REPLACEMENT;  Surgeon: Tarry Kos, MD;  Location: [facility] OR;  Service: Orthopedics;  Laterality: Right;   JOINT REPLACEMENT  [id number]   R Hip   kidney stents     Social History   Tobacco Use   Smoking status: Never   Smokeless tobacco: Never  Substance Use Topics  Alcohol use: No    Family History  Problem Relation Age of Onset   Mental illness Mother    Hypertension Mother    Hyperlipidemia Mother    Mental illness Father    COPD Father    Mental illness Brother     Marital Status: Married  ROS  Review of Systems  Constitutional: Positive for malaise/fatigue.  Cardiovascular:  Positive for dyspnea on exertion, leg swelling, orthopnea and paroxysmal nocturnal dyspnea. Negative for chest pain.  Gastrointestinal:  Positive for constipation. Negative for melena.  All other systems reviewed and are  negative. Objective   Vitals with BMI 02/04/2021 02/04/2021 02/04/2021  Height - - -  Weight 257 lbs 6 oz - -  BMI 35.92 - -  Systolic - 163 142  Diastolic - 92 77  Pulse - 90 89    Blood pressure (!) 163/92, pulse 90, temperature 98.4 F (36.9 C), temperature source Oral, resp. rate 20, height 5\' 11"  (1.803 m), weight 116.8 kg, SpO2 96 %. Body mass index is 35.9 kg/m.   Vitals with BMI 02/04/2021 02/04/2021 02/04/2021  Height - - -  Weight - 257 lbs 6 oz -  BMI - 35.92 -  Systolic 164 - 163  Diastolic 90 - 92  Pulse 90 - 90    Physical Exam Constitutional:      General: He is not in acute distress.    Appearance: He is obese. He is not ill-appearing.  HENT:     Mouth/Throat:     Mouth: Mucous membranes are moist.  Eyes:     Extraocular Movements: Extraocular movements intact.  Neck:     Vascular: JVD present. No carotid bruit.  Cardiovascular:     Pulses: Intact distal pulses.          Popliteal pulses are 2+ on the right side.       Dorsalis pedis pulses are 2+ on the right side and 2+ on the left side.  Pulmonary:     Effort: Pulmonary effort is normal.     Breath sounds: Rales (bibasilar) present.  Abdominal:     General: There is no distension.     Palpations: Abdomen is soft.     Tenderness: There is no abdominal tenderness.     Comments: obese  Musculoskeletal:        General: Swelling (2 + bilateral pitting leg edema) present. Normal range of motion.     Cervical back: Normal range of motion.  Skin:    General: Skin is warm.     Capillary Refill: Capillary refill takes less than 2 seconds.  Neurological:     General: No focal deficit present.     Mental Status: He is oriented to person, place, and time.   Laboratory examination:    Recent Labs    02/02/21 0236 02/03/21 0553 02/04/21 0315  NA 141 140 139  K 5.4* 5.4* 4.4  CL 109 106 105  CO2 24 27 24   GLUCOSE 142* 149* 161*  BUN 121* 106* 100*  CREATININE 1.94* 2.05* 1.98*  CALCIUM 8.3* 8.2*  8.2*  GFRNONAA 40* 37* 39*   estimated creatinine clearance is 53.5 mL/min (A) (by C-G formula based on SCr of 1.98 mg/dL (H)).  CMP Latest Ref Rng & Units 02/04/2021 02/03/2021 02/02/2021  Glucose 70 - 99 mg/dL 02/05/2021) 02/04/2021) 956(L)  BUN 6 - 20 mg/dL 875(I) 433(I) 951(O)  Creatinine 0.61 - 1.24 mg/dL 841(Y) 606(T) 0.16(W)  Sodium 135 - 145 mmol/L 139 140 141  Potassium 3.5 - 5.1 mmol/L 4.4 5.4(H) 5.4(H)  Chloride 98 - 111 mmol/L 105 106 109  CO2 22 - 32 mmol/L 24 27 24   Calcium 8.9 - 10.3 mg/dL 8.2(L) 8.2(L) 8.3(L)  Total Protein 6.5 - 8.1 g/dL 7.5 7.4 7.7  Total Bilirubin 0.3 - 1.2 mg/dL ) 0.6 0.7  Alkaline Phos 38 - 126 U/L 60 65 70  AST 15 - 41 U/L 19 14(L) 13(L)  ALT 0 - 44 U/L 6 6 5    CBC Latest Ref Rng & Units 02/04/2021 02/03/2021 02/02/2021  WBC 4.0 - 10.5 K/uL 11.0(H) 9.5 9.7  Hemoglobin 13.0 - 17.0 g/dL 02/05/2021) 7.7(L) 7.9(L)  Hematocrit 39.0 - 52.0 % 26.5(L) 24.7(L) 24.8(L)  Platelets 150 - 400 K/uL 205 219 243   Lipid Panel No results for input(s): CHOL, TRIG, LDLCALC, VLDL, HDL, CHOLHDL, LDLDIRECT in the last 8760 hours.  HEMOGLOBIN A1C Lab Results  Component Value Date   HGBA1C 6.3 (H) 02/01/2021   MPG 134.11 02/01/2021   TSH No results for input(s): TSH in the last 8760 hours. BNP (last 3 results) Recent Labs    02/01/21 0646  BNP 1,671.1*    Cardiac Panel (last 3 results) Recent Labs    02/03/21 0553 02/03/21 1157 02/04/21 0315  TROPONINIHS 368* 798* 1,283*     Medications and allergies   Allergies  Allergen Reactions   No Known Allergies      Current Meds  Medication Sig   aspirin 81 MG EC tablet Take 81 mg by mouth daily.   clotrimazole-betamethasone (LOTRISONE) cream Apply 1 application topically 2 (two) times daily. (Patient taking differently: Apply 1 application topically 2 (two) times daily as needed (dry skin).)   Dulaglutide (TRULICITY) 1.5 MG/0.5ML SOPN Inject 1.5 Units into the skin once a week.   glipiZIDE (GLUCOTROL) 10 MG  tablet Take 10 mg by mouth daily as needed (if glucose above 180).   HYDROcodone-acetaminophen (NORCO) 10-325 MG tablet Take 1 tablet by mouth every 4 (four) hours as needed.   rosuvastatin (CRESTOR) 20 MG tablet Take 1 tablet (20 mg total) by mouth every evening.   tiZANidine (ZANAFLEX) 4 MG tablet Take 1 tablet (4 mg total) by mouth every 6 (six) hours as needed for muscle spasms.    Scheduled Meds:  aspirin EC  81 mg Oral Daily   insulin aspart  0-15 Units Subcutaneous TID WC   insulin aspart  0-5 Units Subcutaneous QHS   isosorbide mononitrate  30 mg Oral Daily   metolazone  10 mg Oral Daily   metoprolol succinate  50 mg Oral Daily   pantoprazole  40 mg Oral BID   rosuvastatin  20 mg Oral QPM   Continuous Infusions:  furosemide 120 mg (02/04/21 0437)   PRN Meds:.acetaminophen **OR** acetaminophen, hydrALAZINE, HYDROcodone-acetaminophen, prochlorperazine   I/O last 3 completed shifts: In: 931.5 [P.O.:360; I.V.:20; Blood:427.5; IV Piggyback:124] Out: 630 [Urine:630] No intake/output data recorded.    Radiology:   CXR 02/03/2021: Increase in bibasilar airspace disease representing edema versus multifocal pneumonia.  Ultrasound of the abdomen 02/01/2021: Increase in bibasilar airspace disease representing edema versus multifocal pneumonia.  Cardiac Studies:   Left Heart Catheterization 08/22/2020: ANGIOGRAM/CORONARY ARTERIOGRAM:    There was acute total occlusion in the mid right coronary artery.  There was  severe disease in the mid left anterior descending.  There was  severe disease in the mid   circumflex.  There was  moderate disease in the ostial posterior descending artery.   LEFT VENTRICULOGRAM:  Left  ventriculography showed a segmental abnormality in the inferior wall  with an estimated EF of  40-45%   PCI:    A 3.0 mm, X 23 mm Xience Skypoint DES was placed in the Mid right coronary  artery, with subsequent dilatation up to 3.5 mm  A 3.5 mm by 23 mm  Xience Skypoint DES was placed in the Proximal right  coronary artery, where there appeared to be some dissection perhaps from  the guide catheter at the site of a moderate stenosis.  A PTCA with a 2.5 mm, X 15 mm balloon was performed in the ostial and  proximal posterior descending artery for probable thrombus embolization  from the STEMI lesion.   Echocardiogram 02/02/2021:   1. Left ventricular ejection fraction, by estimation, is 50 to 55%. The left ventricle has low normal function. The left ventricle demonstrates regional wall motion abnormalities (see scoring diagram/findings for description). There is mild left  ventricular hypertrophy. Left ventricular diastolic parameters were normal.  2. Right ventricular systolic function is normal. The right ventricular size is normal. Tricuspid regurgitation signal is inadequate for assessing PA pressure.  3. Left atrial size was severely dilated.  4. Right atrial size was mildly dilated.  5. The mitral valve is grossly normal. Mild mitral valve regurgitation.  6. The aortic valve is tricuspid. Aortic valve regurgitation is not visualized. No aortic stenosis is present. Aortic valve mean gradient measures 4.0 mmHg.  7. The inferior vena cava is dilated in size with >50% respiratory variability, suggesting right atrial pressure of 8 mmHg.   EKG:  EKG 02/04/2021: Normal sinus rhythm at rate of 79 bpm, right axis deviation, cannot exclude high lateral infarct old.  Consider lead misplacement.  Lead I and aVR.  Compared to 02/01/2021, lead reversal is not present otherwise normal sinus rhythm, old inferior infarct and poor R wave progression, cannot exclude old anterolateral infarct.  Assessment  [name] Snow is a 55 y.o. 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    DES placement to occluded mid and proximal RCA on 08/22/2020 when he presented with STEMI.  He has residual mid circumflex and mid LAD stenosis and in view of noncompliance, diabetic state, was recommended CABG which never occurred.  Patient previously morbidly obese and has lost about 100 pounds in weight over the past 2 years.  Cardiac history includes myocardial infarction and DES placement to occluded mid and proximal RCA on 08/22/2020 when he presented with STEMI.  He has residual mid circumflex and mid LAD stenosis and in view of noncompliance, diabetic state, was recommended CABG which never occurred.  1.  Acute on chronic diastolic heart failure 2.  NSTEMI 3.  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  History of GI bleed needing blood transfusion <66-month ago, presently on aspirin. 5.  Acute on chronic kidney disease stage IIIb 6.  Primary hypertension 7.  Hypercholesterolemia 8.  Diabetes mellitus type 2 controlled with complications with hyperglycemia  Recommendations:   Patient presenting with worsening dyspnea, leg edema, fatigue after recent hospital discharge after he underwent right transmetatarsal amputation due to diabetic foot ulcer.  Fortunately the foot ulcer is well-healing.  With regard to his presentation, suspect noncompliance with diet and progression of probably underlying coronary artery disease.  EKG abnormalities  suggest probable progression of disease in the circumflex coronary artery as he has known residual circumflex and LAD stenosis.  However in view of recent GI bleed needing blood transfusion, he is not a candidate for dual antiplatelet therapy, would recommend continued medical therapy and consider repeat angiography in 4 to 6 weeks.  Dr. Marca Ancona is now following from GI standpoint and will await her recommendations.  Discussed with Dr. Delano Metz from nephrology, patient is started  to improve his diuresis.  He is also feeling well and although still has orthopnea, he does not have rest dyspnea and his leg edema has improved.  Hence continue present management.  Will avoid ACE inhibitor's and ARB in view of acute renal failure, hold off on spironolactone, consider adding amlodipine if blood pressure is not well controlled, for now permissive hypertension is indicated in view of ARF.  Is already on a statin.  Lipids are well controlled.   Yates Decamp, MD, [facility] 02/04/2021, 12:39 PM Office: [phone]

## 2021-02-04 NOTE — Care Management Important Message (Signed)
Important Message  Patient Details IM Letter given to the Patient. Name: [name] Snow MRN: [id number] Date of Birth: [birth year]   Medicare Important Message Given:  Yes     Caren Macadam 02/04/2021, 12:32 PM

## 2021-02-04 NOTE — Progress Notes (Signed)
Pt sitting in chair, pt c/o nausea and pain, RN gave prn meds and scheduled meds. Pt denies any further needs at this time. Call bell within reach and RN number on board. Pt educated on coughing and deep breathing and incentive spirometer.

## 2021-02-04 NOTE — Progress Notes (Signed)
PROGRESS NOTE  [name] Snow UXL:[id number] DOB: 07-Mar-1966 DOA: 02/01/2021 PCP: Patient, No Pcp Per (Inactive)   LOS: 3 days   Brief Narrative / Interim history: 55 year old male with history of CAD, chronic kidney disease stage IIIa with baseline creatinine 1.2-1.3, obesity, hypertension, hyperlipidemia, comes into the hospital with complaints of weakness, shortness of breath, lower extremity swelling and difficulty laying flat.  Patient had a recent prolonged hospitalization for about 19 days in [place].  Apparently presented there for infected foot and he is now status post metatarsal amputation.  He was given antibiotics in the setting of infection which led to renal failure and he required HD x1.  He tells me his creatinine was as high as 6.5 when he was hospitalized there and believes that it was around 4 when he left but he is not sure.  That hospital course was also complicated by GI bleed with bright red blood per rectum/melena for which he underwent a GI evaluation with EGD, colonoscopy as well as a capsule study and apparently without a clear-cut source of his bleed.  He required a total of 6 units of packed red blood cells.  He was telling me he was supposed to leave with IV antibiotics and PICC line but did and does not want antibiotics given the fact that they caused his renal failure to begin with.  He is also had a recent STEMI in April, was hospitalized at [facility] and underwent stenting of the proximal and mid RCA with PTCA to the ostial and proximal PDA.  EF was 40-45%.  He also was found to have severe three-vessel disease with severe stenosis of the OM and LAD/D1 and currently is in the process of getting bypass surgery but it was delayed due to his hospitalization  Subjective / 24h Interval events: Still remains significantly short of breath today.  He could not lay flat overnight and had a hard time sleeping.  Denies any chest pain today  Assessment &  Plan: Principal Problem Acute on chronic combined CHF due to acute pulmonary edema in the setting of acute on chronic diastolic CHF-patient has been admitted to the hospital for elevated BNP, fluid overload -2D echo done 9/17 showed EF 50-55%, low normal, as well as regional WMA.  It also showed mild LVH, normal RV. -Finally weight is down, continue high-dose Lasix -Strict ins and outs, daily weights -Chest x-ray done 9/18 with significant fluid overload  Active Problems Coronary artery disease, severe three-vessel disease-patient established care with Dr. Ty Hilts at [facility] for CABG in the near future. -Currently his CAD appears stable, he has no chest pain or ACS type symptoms.  -Patient has some back pain 9/18.  Troponin was checked and it was in the 300 range yesterday climbing up to 1200 this morning.  Discussed with cardiology, Dr. Rosemary Holms, he will see in consultation.  For now add metoprolol as well as Imdur  Acute kidney injury on chronic kidney disease stage IIIa  -Patient's creatinine on admission was 1.9 from baseline about 1.2-1.4.  He was recently hospitalized earlier this month requiring HD.  Patient recalls creatinine around 4 from his discharge but up to 6 at one-point -Creatinine overall stable with diuresis, continue to monitor  Hyperkalemia -He tells me this has been a problem also during his prior hospital stay, received Lokelma, Lasix currently potassium normal  Normocytic anemia, recent GI bleed-fecal occult is positive however he is clinically without bleeding.  Stools are brown and there is no signs  of melena/bright red blood.  GI consulted, and did not recommend any further work-up at this point with close monitoring. -He was transfused unit of packed red blood cells on 9/18 for hemoglobin of 7.7.  Keep hemoglobin greater than 8 given cardiac history  Recent foot infection, osteomyelitis-status post metatarsal amputation recently.  Wound care consulted, no odor,  drainage, fluctuance or open areas.  His white count is normal and he is afebrile.  Recommend outpatient follow-up with primary surgeon.  Hyperlipidemia-continue statin  Hypertension-hold ACE inhibitor's   DM2 - A1c, SSI, glucose checks, DM diet  CBG (last 3)  Recent Labs    02/03/21 2139 02/04/21 0758 02/04/21 1214  GLUCAP 143* 165* 206*    Scheduled Meds:  aspirin EC  81 mg Oral Daily   insulin aspart  0-15 Units Subcutaneous TID WC   insulin aspart  0-5 Units Subcutaneous QHS   isosorbide mononitrate  30 mg Oral Daily   metolazone  10 mg Oral Daily   metoprolol succinate  50 mg Oral Daily   pantoprazole  40 mg Oral BID   rosuvastatin  20 mg Oral QPM   Continuous Infusions:  furosemide 120 mg (02/04/21 1301)   PRN Meds:.acetaminophen **OR** acetaminophen, hydrALAZINE, HYDROcodone-acetaminophen, prochlorperazine  Diet Orders (From admission, onward)     Start     Ordered   02/03/21 1200  Diet renal/carb modified with fluid restriction Diet-HS Snack? Nothing; Fluid restriction: 1200 mL Fluid; Room service appropriate? Yes; Fluid consistency: Thin  Diet effective now       Question Answer Comment  Diet-HS Snack? Nothing   Fluid restriction: 1200 mL Fluid   Room service appropriate? Yes   Fluid consistency: Thin      02/03/21 1159            DVT prophylaxis: SCDs Start: 02/01/21 1827     Code Status: Full Code  Family Communication: Aunt at bedside 9/18  Status is: Inpatient  Remains inpatient appropriate because:Inpatient level of care appropriate due to severity of illness  Dispo: The patient is from: Home              Anticipated d/c is to: Home              Patient currently is not medically stable to d/c.   Difficult to place patient No  Level of care: Progressive  Consultants:  GI  Procedures:  2D echo  Microbiology  none  Antimicrobials: none    Objective: Vitals:   02/03/21 2139 02/04/21 0140 02/04/21 0443 02/04/21 0447  BP:  (!) 164/88 (!) 142/77 (!) 163/92   Pulse:  89 90   Resp: 20  20   Temp: 98.4 F (36.9 C) 98.5 F (36.9 C) 98.4 F (36.9 C)   TempSrc: Oral Oral Oral   SpO2: 97% 96% 96%   Weight:    116.8 kg  Height:        Intake/Output Summary (Last 24 hours) at 02/04/2021 1342 Last data filed at 02/04/2021 0600 Gross per 24 hour  Intake 811.5 ml  Output 450 ml  Net 361.5 ml    Filed Weights   02/01/21 1600 02/02/21 0458 02/04/21 0447  Weight: 117.9 kg 117.6 kg 116.8 kg    Examination:  Constitutional: Uncomfortable, sitting in the edge of the bed Eyes: No scleral icterus ENMT: mmm Neck: normal, supple Respiratory: Bibasilar crackles, increased respiratory effort, no wheezing Cardiovascular: Regular rate and rhythm, 2+ pitting edema up to thighs Abdomen: Soft, NT, ND, bowel sounds  positive Musculoskeletal: no clubbing / cyanosis.  Skin: No rashes Neurologic: No focal deficits  Data Reviewed: I have independently reviewed following labs and imaging studies   CBC: Recent Labs  Lab 02/01/21 0646 02/01/21 1913 02/02/21 0236 02/03/21 0553 02/04/21 0315  WBC 10.4  --  9.7 9.5 11.0*  NEUTROABS 7.5  --   --   --   --   HGB 8.5* 8.0* 7.9* 7.7* 8.5*  HCT 27.3* 25.4* 24.8* 24.7* 26.5*  MCV 93.2  --  91.9 91.8 88.6  PLT 262  --  243 219 205    Basic Metabolic Panel: Recent Labs  Lab 02/01/21 0646 02/02/21 0236 02/03/21 0553 02/04/21 0315  NA 136 141 140 139  K 5.1 5.4* 5.4* 4.4  CL 103 109 106 105  CO2 23 24 27 24   GLUCOSE 158* 142* 149* 161*  BUN 125* 121* 106* 100*  CREATININE 1.97* 1.94* 2.05* 1.98*  CALCIUM 8.2* 8.3* 8.2* 8.2*  MG  --   --  2.3  --     Liver Function Tests: Recent Labs  Lab 02/01/21 0646 02/02/21 0236 02/03/21 0553 02/04/21 0315  AST 16 13* 14* 19  ALT 6 5 6 6   ALKPHOS 80 70 65 60  BILITOT 1.1 0.7 0.6 1.4*  PROT 8.2* 7.7 7.4 7.5  ALBUMIN 2.5* 2.3* 2.3* 2.5*    Coagulation Profile: No results for input(s): INR, PROTIME in the last 168  hours. HbA1C: Recent Labs    02/01/21 1913  HGBA1C 6.3*    CBG: Recent Labs  Lab 02/03/21 1233 02/03/21 1621 02/03/21 2139 02/04/21 0758 02/04/21 1214  GLUCAP 155* 132* 143* 165* 206*     Recent Results (from the past 240 hour(s))  Resp Panel by RT-PCR (Flu A&B, Covid) Nasopharyngeal Swab     Status: None   Collection Time: 02/01/21  7:42 AM   Specimen: Nasopharyngeal Swab; Nasopharyngeal(NP) swabs in vial transport medium  Result Value Ref Range Status   SARS Coronavirus 2 by RT PCR NEGATIVE NEGATIVE Final    Comment: (NOTE) SARS-CoV-2 target nucleic acids are NOT DETECTED.  The SARS-CoV-2 RNA is generally detectable in upper respiratory specimens during the acute phase of infection. The lowest concentration of SARS-CoV-2 viral copies this assay can detect is 138 copies/mL. A negative result does not preclude SARS-Cov-2 infection and should not be used as the sole basis for treatment or other patient management decisions. A negative result may occur with  improper specimen collection/handling, submission of specimen other than nasopharyngeal swab, presence of viral mutation(s) within the areas targeted by this assay, and inadequate number of viral copies(<138 copies/mL). A negative result must be combined with clinical observations, patient history, and epidemiological information. The expected result is Negative.  Fact Sheet for Patients:  02/06/21  Fact Sheet for Healthcare Providers:  02/03/21  This test is no t yet approved or cleared by the [employer] FDA and  has been authorized for detection and/or diagnosis of SARS-CoV-2 by FDA under an Emergency Use Authorization (EUA). This EUA will remain  in effect (meaning this test can be used) for the duration of the COVID-19 declaration under Section 564(b)(1) of the Act, 21 U.S.C.section 360bbb-3(b)(1), unless the authorization is terminated   or revoked sooner.       Influenza A by PCR NEGATIVE NEGATIVE Final   Influenza B by PCR NEGATIVE NEGATIVE Final    Comment: (NOTE) The Xpert Xpress SARS-CoV-2/FLU/RSV plus assay is intended as an aid in the diagnosis of influenza from  Nasopharyngeal swab specimens and should not be used as a sole basis for treatment. Nasal washings and aspirates are unacceptable for Xpert Xpress SARS-CoV-2/FLU/RSV testing.  Fact Sheet for Patients: [employer]  Fact Sheet for Healthcare Providers: [employer]  This test is not yet approved or cleared by the [place] FDA and has been authorized for detection and/or diagnosis of SARS-CoV-2 by FDA under an Emergency Use Authorization (EUA). This EUA will remain in effect (meaning this test can be used) for the duration of the COVID-19 declaration under Section 564(b)(1) of the Act, 21 U.S.C. section 360bbb-3(b)(1), unless the authorization is terminated or revoked.  Performed at [facility], [address] [place], [place] [address]       Radiology Studies: No results found.  Pamella Pert, MD, PhD [facility]  Between 7 am - 7 pm I am available, please contact me via Amion (for emergencies) or Securechat (non urgent messages)  Between 7 pm - 7 am I am not available, please contact night coverage MD/APP via Amion

## 2021-02-05 ENCOUNTER — Inpatient Hospital Stay (HOSPITAL_COMMUNITY): Payer: Medicare Other

## 2021-02-05 DIAGNOSIS — N179 Acute kidney failure, unspecified: Secondary | ICD-10-CM | POA: Diagnosis not present

## 2021-02-05 DIAGNOSIS — I509 Heart failure, unspecified: Secondary | ICD-10-CM | POA: Diagnosis not present

## 2021-02-05 DIAGNOSIS — R778 Other specified abnormalities of plasma proteins: Secondary | ICD-10-CM

## 2021-02-05 DIAGNOSIS — I5033 Acute on chronic diastolic (congestive) heart failure: Secondary | ICD-10-CM

## 2021-02-05 DIAGNOSIS — D649 Anemia, unspecified: Secondary | ICD-10-CM | POA: Diagnosis not present

## 2021-02-05 LAB — COMPREHENSIVE METABOLIC PANEL
ALT: 8 U/L (ref 0–44)
AST: 16 U/L (ref 15–41)
Albumin: 2.3 g/dL — ABNORMAL LOW (ref 3.5–5.0)
Alkaline Phosphatase: 66 U/L (ref 38–126)
Anion gap: 10 (ref 5–15)
BUN: 105 mg/dL — ABNORMAL HIGH (ref 6–20)
CO2: 24 mmol/L (ref 22–32)
Calcium: 8.1 mg/dL — ABNORMAL LOW (ref 8.9–10.3)
Chloride: 104 mmol/L (ref 98–111)
Creatinine, Ser: 1.99 mg/dL — ABNORMAL HIGH (ref 0.61–1.24)
GFR, Estimated: 38 mL/min — ABNORMAL LOW (ref >60)
Glucose, Bld: 133 mg/dL — ABNORMAL HIGH (ref 70–99)
Potassium: 4.5 mmol/L (ref 3.5–5.1)
Sodium: 138 mmol/L (ref 135–145)
Total Bilirubin: 1.4 mg/dL — ABNORMAL HIGH (ref 0.3–1.2)
Total Protein: 6.7 g/dL (ref 6.5–8.1)

## 2021-02-05 LAB — CBC
HCT: 24.8 % — ABNORMAL LOW (ref 39.0–52.0)
Hemoglobin: 7.9 g/dL — ABNORMAL LOW (ref 13.0–17.0)
MCH: 28.6 pg (ref 26.0–34.0)
MCHC: 31.9 g/dL (ref 30.0–36.0)
MCV: 89.9 fL (ref 80.0–100.0)
Platelets: 198 10*3/uL (ref 150–400)
RBC: 2.76 MIL/uL — ABNORMAL LOW (ref 4.22–5.81)
RDW: 16.8 % — ABNORMAL HIGH (ref 11.5–15.5)
WBC: 9.1 10*3/uL (ref 4.0–10.5)
nRBC: 0 % (ref 0.0–0.2)

## 2021-02-05 LAB — GLUCOSE, CAPILLARY
Glucose-Capillary: 313 mg/dL — ABNORMAL HIGH (ref 70–99)
Glucose-Capillary: 135 mg/dL — ABNORMAL HIGH (ref 70–99)
Glucose-Capillary: 153 mg/dL — ABNORMAL HIGH (ref 70–99)
Glucose-Capillary: 137 mg/dL — ABNORMAL HIGH (ref 70–99)

## 2021-02-05 LAB — MAGNESIUM: Magnesium: 2 mg/dL (ref 1.7–2.4)

## 2021-02-05 MED ORDER — INSULIN GLARGINE-YFGN 100 UNIT/ML ~~LOC~~ SOLN
5.0000 [IU] | Freq: Every day | SUBCUTANEOUS | Status: DC
Start: 2021-02-05 — End: 2021-02-08
  Administered 2021-02-05 – 2021-02-08 (×4): 5 [IU] via SUBCUTANEOUS
  Filled 2021-02-05 (×4): qty 0.05

## 2021-02-05 MED ORDER — FUROSEMIDE 10 MG/ML IJ SOLN
160.0000 mg | Freq: Three times a day (TID) | INTRAVENOUS | Status: DC
  Administered 2021-02-05 – 2021-02-06 (×2): 160 mg via INTRAVENOUS
  Filled 2021-02-05: qty 10
  Filled 2021-02-05 (×2): qty 16
  Filled 2021-02-05: qty 10

## 2021-02-05 NOTE — Progress Notes (Signed)
Subjective:  Breathing improved  Objective:  Vital Signs in the last 24 hours: Temp:  [97.9 F (36.6 C)-98.4 F (36.9 C)] 97.9 F (36.6 C) (09/20 0431) Pulse Rate:  [64-90] 64 (09/20 0431) Resp:  [12] 12 (09/20 0431) BP: (164-171)/(90-93) 171/93 (09/20 0431) SpO2:  [94 %-99 %] 94 % (09/20 0431)  Intake/Output from previous day: 09/19 0701 - 09/20 0700 In: 120 [P.O.:120] Out: 500 [Urine:500]  Physical Exam Vitals and nursing note reviewed.  Constitutional:      General: He is not in acute distress.    Appearance: He is well-developed.  HENT:     Head: Normocephalic and atraumatic.  Eyes:     Conjunctiva/sclera: Conjunctivae normal.     Pupils: Pupils are equal, round, and reactive to light.  Neck:     Vascular: No JVD.  Cardiovascular:     Rate and Rhythm: Normal rate and regular rhythm.     Pulses: Normal pulses and intact distal pulses.     Heart sounds: No murmur heard. Pulmonary:     Effort: Pulmonary effort is normal.     Breath sounds: Decreased air movement present. Examination of the right-lower field reveals decreased breath sounds. Examination of the left-lower field reveals decreased breath sounds. Decreased breath sounds present. No wheezing or rales.  Abdominal:     General: Bowel sounds are normal.     Palpations: Abdomen is soft.     Tenderness: There is no rebound.  Musculoskeletal:        General: No tenderness. Normal range of motion.     Right lower leg: Edema present.     Left lower leg: Edema present.     Comments: B/l transmetatarsal amputation Rt sided amputation wound, well healing  Lymphadenopathy:     Cervical: No cervical adenopathy.  Skin:    General: Skin is warm and dry.  Neurological:     Mental Status: He is alert and oriented to person, place, and time.     Cranial Nerves: No cranial nerve deficit.     Lab Results: BMP Recent Labs    02/03/21 0553 02/04/21 0315 02/05/21 0352  NA 140 139 138  K 5.4* 4.4 4.5  CL 106 105  104  CO2 27 24 24   GLUCOSE 149* 161* 133*  BUN 106* 100* 105*  CREATININE 2.05* 1.98* 1.99*  CALCIUM 8.2* 8.2* 8.1*  GFRNONAA 37* 39* 38*    CBC Recent Labs  Lab 02/01/21 0646 02/01/21 1913 02/05/21 0352  WBC 10.4   < > 9.1  RBC 2.93*   < > 2.76*  HGB 8.5*   < > 7.9*  HCT 27.3*   < > 24.8*  PLT 262   < > 198  MCV 93.2   < > 89.9  MCH 29.0   < > 28.6  MCHC 31.1   < > 31.9  RDW 14.8   < > 16.8*  LYMPHSABS 1.8  --   --   MONOABS 0.8  --   --   EOSABS 0.3  --   --   BASOSABS 0.0  --   --    < > = values in this interval not displayed.    HEMOGLOBIN A1C Lab Results  Component Value Date   HGBA1C 6.3 (H) 02/01/2021   MPG 134.11 02/01/2021    Cardiac Panel (last 3 results) No results for input(s): CKTOTAL, CKMB, TROPONINI, RELINDX in the last 8760 hours.  BNP (last 3 results) Recent Labs    02/01/21 [phone]  BNP 1,671.1*    TSH No results for input(s): TSH in the last 8760 hours.  Lipid Panel     Component Value Date/Time   CHOL 219 (H) 09/29/2017 1629   TRIG 117 09/29/2017 1629   HDL 41 09/29/2017 1629   CHOLHDL 5.3 (H) 09/29/2017 1629   CHOLHDL 4.3 12/14/2015 1306   VLDL 35 (H) 12/14/2015 1306   LDLCALC 155 (H) 09/29/2017 1629   LDLDIRECT (H) 09/02/2007 1330    127 (NOTE) ATP III Classification (LDL):      < 100        mg/dL         Optimal     725 - 129     mg/dL         Near or Above Optimal     130 - 159     mg/dL         Borderline High     160 - 189     mg/dL         High      > 366        mg/dL         Very  High      Hepatic Function Panel Recent Labs    02/03/21 0553 02/04/21 0315 02/05/21 0352  PROT 7.4 7.5 6.7  ALBUMIN 2.3* 2.5* 2.3*  AST 14* 19 16  ALT 6 6 8   ALKPHOS 65 60 66  BILITOT 0.6 1.4* 1.4*    CXR 02/03/2021: Increase in bibasilar airspace disease representing edema versus multifocal pneumonia.   Ultrasound of the abdomen 02/01/2021: Increase in bibasilar airspace disease representing edema versus multifocal  pneumonia.    EKG 02/04/2021:  Normal sinus rhythm at rate of 79 bpm, right axis deviation, cannot exclude high lateral infarct old.  Consider lead misplacement.  Lead I and aVR.  Compared to 02/01/2021, lead reversal is not present otherwise normal sinus rhythm, old inferior infarct and poor R wave progression, cannot exclude old anterolateral infarct.    Echocardiogram 02/02/2021:  1. Left ventricular ejection fraction, by estimation, is 50 to 55%. The left ventricle has low normal function. The left ventricle demonstrates regional wall motion abnormalities (see scoring diagram/findings for description). There is mild left  ventricular hypertrophy. Left ventricular diastolic parameters were normal.  2. Right ventricular systolic function is normal. The right ventricular size is normal. Tricuspid regurgitation signal is inadequate for assessing PA pressure.  3. Left atrial size was severely dilated.  4. Right atrial size was mildly dilated.  5. The mitral valve is grossly normal. Mild mitral valve regurgitation.  6. The aortic valve is tricuspid. Aortic valve regurgitation is not visualized. No aortic stenosis ipresent. Aortic valve mean gradient measures 4.0 mmHg.  7. The inferior vena cava is dilated in size with >50% respiratory variability, suggesting right atrial pressure of 8 mmHg.  Left Heart Catheterization 08/22/2020: ANGIOGRAM/CORONARY ARTERIOGRAM:    There was acute total occlusion in the mid right coronary artery.  There was  severe disease in the mid left anterior descending.  There was  severe disease in the mid   circumflex.  There was  moderate disease in the ostial posterior descending artery.   LEFT VENTRICULOGRAM:  Left ventriculography showed a segmental abnormality in the inferior wall  with an estimated EF of  40-45%   PCI:    A 3.0 mm, X 23 mm Xience Skypoint DES was placed in the Mid right coronary  artery, with subsequent dilatation up to 3.5  mm  A 3.5 mm by 23  mm Xience Skypoint DES was placed in the Proximal right  coronary artery, where there appeared to be some dissection perhaps from  the guide catheter at the site of a moderate stenosis.  A PTCA with a 2.5 mm, X 15 mm balloon was performed in the ostial and  proximal posterior descending artery for probable thrombus embolization  from the STEMI lesion.     Assessment & Recommendations:  55 year old Caucasian male with uncontrolled hypertension, type 2 diabetes mellitus, CAD (recent primary PCI for RCA 08/2020, Lcx + LAD and circumflex disease awaiting CABG, preferred due to concern for medication noncompliance), diabetic foot ulcer s/p b/l metatarsal amputation, recent AKI with underlying CKD stage III, now admitted with acute on chronic HFpEF, troponin elevation  Acute on chronic HFpEF: Diuresing well. Continue lasix as per nephrology recommendations.   CAD: Mild troponin elevation, likely type 2 MI than ACS. Continue Aspirin 81 mg, rosuvastatin 20 mg.  Continue Imdur 30 mg, metoprolol succinate 25 mg daily.  Residual Lcx and LAD disease. In long term, CABG will still be a better option given compliance concerns. Recommend re-establishing with Dr. Ty Hilts and cardiologist in [facility] system.  Hypertension: Improving.     Elder Negus, MD Pager: [phone] Office: [phone]

## 2021-02-05 NOTE — Progress Notes (Signed)
PROGRESS NOTE  [name] Snow:[id number] DOB: [birth year] DOA: 02/01/2021 PCP: Patient, No Pcp Per (Inactive)   LOS: 4 days   Brief Narrative / Interim history: 55 year old male with history of CAD, chronic kidney disease stage IIIa with baseline creatinine 1.2-1.3, obesity, hypertension, hyperlipidemia, comes into the hospital with complaints of weakness, shortness of breath, lower extremity swelling and difficulty laying flat.  Patient had a recent prolonged hospitalization for about 19 days in [place].  Apparently presented there for infected right foot and he is now status post metatarsal amputation.  He was given antibiotics in the setting of infection which led to renal failure and he required HD x1.  He tells me his creatinine was as high as 6.5 when he was hospitalized there and believes that it was around 4 when he left but he is not sure.  That hospital course was also complicated by GI bleed with bright red blood per rectum/melena for which he underwent a GI evaluation with EGD, colonoscopy as well as a capsule study and apparently without a clear-cut source of his bleed.  He required a total of 6 units of packed red blood cells.  He was telling me he was supposed to leave with IV antibiotics and PICC line but did and does not want antibiotics given the fact that they caused his renal failure to begin with.  He is also had a recent STEMI in April, was hospitalized at [facility] and underwent stenting of the proximal and mid RCA with PTCA to the ostial and proximal PDA.  EF was 40-45%.  He also was found to have severe three-vessel disease with severe stenosis of the OM and LAD/D1 and currently is in the process of getting bypass surgery but it was delayed due to his hospitalization  Subjective / 24h Interval events: Breathing is improved.  Still not able to lay flat but was able to sleep in bed last night with bed up at around 30 degrees  Assessment & Plan: Principal  Problem Acute on chronic combined CHF due to acute pulmonary edema in the setting of acute on chronic diastolic CHF-patient has been admitted to the hospital for elevated BNP, fluid overload -2D echo done 9/17 showed EF 50-55%, low normal, as well as regional WMA.  It also showed mild LVH, normal RV. -Continue aggressive diuresis, clinically improving and creatinine fortunately is remaining stable -Strict ins and outs, daily weights -Chest x-ray done 9/18 with significant fluid overload/pulmonary edema  Active Problems Coronary artery disease, severe three-vessel disease, troponin elevation, NSTEMI type II-patient established care with Dr. Ty Hilts at [facility] for CABG in the near future. -Currently his CAD appears stable, he has no chest pain or ACS type symptoms.  -Patient has some back pain 9/18.  Troponin was checked and it was in the 300 range yesterday climbing up to 1200. Cardiology, Dr. Rosemary Holms consulted. Currently not a candidate for anticoagulation/antiplatelets given recent GI bleed -Added metoprolol, Imdur  Acute kidney injury on chronic kidney disease stage IIIa  -Patient's creatinine on admission was 1.9 from baseline about 1.2-1.4.  He was recently hospitalized earlier this month requiring HD.  Patient recalls creatinine around 4 from his discharge but up to 6 at one-point -Creatinine overall stable with diuresis, continue to monitor, suspect that his baseline will now be around 2  Hyperkalemia -Resolved, continue to monitor  Normocytic anemia, recent GI bleed-fecal occult is positive however he is clinically without bleeding.  Stools are brown and there is no signs of  melena/bright red blood.  GI consulted, and did not recommend any further work-up at this point but only close monitoring. -He was transfused unit of packed red blood cells on 9/18 for hemoglobin of 7.7.  Continue to closely monitor hemoglobin  Recent foot infection, osteomyelitis-status post metatarsal  amputation recently.  Wound care consulted, no odor, drainage, fluctuance or open areas.  His white count is normal and he is afebrile. -I am not sure whether he was supposed to be on IV antibiotics or not, left AMA.  Currently he is unable to lay flat and does not think he will be able to tolerate an MRI.  I will order one for tomorrow, based on results ID vs Ortho surgery may need to weigh in  Hyperlipidemia-continue statin  Hypertension-hold ACE inhibitor's   DM2 - A1c, SSI, glucose checks, DM diet  CBG (last 3)  Recent Labs    02/04/21 2108 02/05/21 0748 02/05/21 1125  GLUCAP 140* 313* 135*    Scheduled Meds:  aspirin EC  81 mg Oral Daily   insulin aspart  0-15 Units Subcutaneous TID WC   insulin aspart  0-5 Units Subcutaneous QHS   isosorbide mononitrate  30 mg Oral Daily   metolazone  10 mg Oral Daily   metoprolol succinate  50 mg Oral Daily   pantoprazole  40 mg Oral BID   rosuvastatin  20 mg Oral QPM   Continuous Infusions:  furosemide 120 mg (02/05/21 0357)   PRN Meds:.acetaminophen **OR** acetaminophen, hydrALAZINE, HYDROcodone-acetaminophen, prochlorperazine  Diet Orders (From admission, onward)     Start     Ordered   02/03/21 1200  Diet renal/carb modified with fluid restriction Diet-HS Snack? Nothing; Fluid restriction: 1200 mL Fluid; Room service appropriate? Yes; Fluid consistency: Thin  Diet effective now       Question Answer Comment  Diet-HS Snack? Nothing   Fluid restriction: 1200 mL Fluid   Room service appropriate? Yes   Fluid consistency: Thin      02/03/21 1159            DVT prophylaxis: SCDs Start: 02/01/21 1827     Code Status: Full Code  Family Communication: Aunt at bedside 9/18  Status is: Inpatient  Remains inpatient appropriate because:Inpatient level of care appropriate due to severity of illness  Dispo: The patient is from: Home              Anticipated d/c is to: Home              Patient currently is not medically  stable to d/c.   Difficult to place patient No  Level of care: Progressive  Consultants:  GI Nephrology Cardiology  Procedures:  2D echo  1. Left ventricular ejection fraction, by estimation, is 50 to 55%. The left ventricle has low normal function. The left ventricle demonstrates regional wall motion abnormalities (see scoring diagram/findings for description). There is mild left ventricular hypertrophy. Left ventricular diastolic parameters were normal.   2. Right ventricular systolic function is normal. The right ventricular size is normal. Tricuspid regurgitation signal is inadequate for assessing PA pressure.   3. Left atrial size was severely dilated.   4. Right atrial size was mildly dilated.   5. The mitral valve is grossly normal. Mild mitral valve regurgitation.   6. The aortic valve is tricuspid. Aortic valve regurgitation is not visualized. No aortic stenosis is present. Aortic valve mean gradient measures 4.0 mmHg.   7. The inferior vena cava is dilated in size with >  50% respiratory variability, suggesting right atrial pressure of 8 mmHg.   Microbiology  none  Antimicrobials: none    Objective: Vitals:   02/04/21 1500 02/05/21 0431 02/05/21 0939 02/05/21 1000  BP: (!) 164/90 (!) 171/93 (!) 165/86 (!) 160/85  Pulse: 90 64 68 69  Resp:  12  20  Temp: 98.4 F (36.9 C) 97.9 F (36.6 C)  98.7 F (37.1 C)  TempSrc: Oral Oral  Oral  SpO2: 99% 94%    Weight:      Height:        Intake/Output Summary (Last 24 hours) at 02/05/2021 1149 Last data filed at 02/04/2021 1800 Gross per 24 hour  Intake 120 ml  Output 500 ml  Net -380 ml    Filed Weights   02/01/21 1600 02/02/21 0458 02/04/21 0447  Weight: 117.9 kg 117.6 kg 116.8 kg    Examination:  Constitutional: More comfortable this morning, sitting in the chair Eyes: No scleral icterus ENMT: Moist mucous membranes Neck: normal, supple Respiratory: Faint bibasilar crackles, significantly improved, no  wheezing heard, moves air well Cardiovascular: Regular rate and rhythm, no murmurs appreciated.  2+ pitting edema Abdomen: Soft, nontender, nondistended, bowel sounds positive Musculoskeletal: no clubbing / cyanosis.  Skin: No rashes appreciated Neurologic: Nonfocal, equal strength  Data Reviewed: I have independently reviewed following labs and imaging studies   CBC: Recent Labs  Lab 02/01/21 0646 02/01/21 1913 02/02/21 0236 02/03/21 0553 02/04/21 0315 02/05/21 0352  WBC 10.4  --  9.7 9.5 11.0* 9.1  NEUTROABS 7.5  --   --   --   --   --   HGB 8.5* 8.0* 7.9* 7.7* 8.5* 7.9*  HCT 27.3* 25.4* 24.8* 24.7* 26.5* 24.8*  MCV 93.2  --  91.9 91.8 88.6 89.9  PLT 262  --  243 219 205 198    Basic Metabolic Panel: Recent Labs  Lab 02/01/21 0646 02/02/21 0236 02/03/21 0553 02/04/21 0315 02/05/21 0352  NA 136 141 140 139 138  K 5.1 5.4* 5.4* 4.4 4.5  CL 103 109 106 105 104  CO2 23 24 27 24 24   GLUCOSE 158* 142* 149* 161* 133*  BUN 125* 121* 106* 100* 105*  CREATININE 1.97* 1.94* 2.05* 1.98* 1.99*  CALCIUM 8.2* 8.3* 8.2* 8.2* 8.1*  MG  --   --  2.3  --  2.0    Liver Function Tests: Recent Labs  Lab 02/01/21 0646 02/02/21 0236 02/03/21 0553 02/04/21 0315 02/05/21 0352  AST 16 13* 14* 19 16  ALT 6 5 6 6 8   ALKPHOS 80 70 65 60 66  BILITOT 1.1 0.7 0.6 1.4* 1.4*  PROT 8.2* 7.7 7.4 7.5 6.7  ALBUMIN 2.5* 2.3* 2.3* 2.5* 2.3*    Coagulation Profile: No results for input(s): INR, PROTIME in the last 168 hours. HbA1C: No results for input(s): HGBA1C in the last 72 hours.  CBG: Recent Labs  Lab 02/04/21 1214 02/04/21 1723 02/04/21 2108 02/05/21 0748 02/05/21 1125  GLUCAP 206* 133* 140* 313* 135*     Recent Results (from the past 240 hour(s))  Resp Panel by RT-PCR (Flu A&B, Covid) Nasopharyngeal Swab     Status: None   Collection Time: 02/01/21  7:42 AM   Specimen: Nasopharyngeal Swab; Nasopharyngeal(NP) swabs in vial transport medium  Result Value Ref Range  Status   SARS Coronavirus 2 by RT PCR NEGATIVE NEGATIVE Final    Comment: (NOTE) SARS-CoV-2 target nucleic acids are NOT DETECTED.  The SARS-CoV-2 RNA is generally detectable in upper  respiratory specimens during the acute phase of infection. The lowest concentration of SARS-CoV-2 viral copies this assay can detect is 138 copies/mL. A negative result does not preclude SARS-Cov-2 infection and should not be used as the sole basis for treatment or other patient management decisions. A negative result may occur with  improper specimen collection/handling, submission of specimen other than nasopharyngeal swab, presence of viral mutation(s) within the areas targeted by this assay, and inadequate number of viral copies(<138 copies/mL). A negative result must be combined with clinical observations, patient history, and epidemiological information. The expected result is Negative.  Fact Sheet for Patients:  BloggerCourse.com  Fact Sheet for Healthcare Providers:  SeriousBroker.it  This test is no t yet approved or cleared by the [place] FDA and  has been authorized for detection and/or diagnosis of SARS-CoV-2 by FDA under an Emergency Use Authorization (EUA). This EUA will remain  in effect (meaning this test can be used) for the duration of the COVID-19 declaration under Section 564(b)(1) of the Act, 21 U.S.C.section 360bbb-3(b)(1), unless the authorization is terminated  or revoked sooner.       Influenza A by PCR NEGATIVE NEGATIVE Final   Influenza B by PCR NEGATIVE NEGATIVE Final    Comment: (NOTE) The Xpert Xpress SARS-CoV-2/FLU/RSV plus assay is intended as an aid in the diagnosis of influenza from Nasopharyngeal swab specimens and should not be used as a sole basis for treatment. Nasal washings and aspirates are unacceptable for Xpert Xpress SARS-CoV-2/FLU/RSV testing.  Fact Sheet for  Patients: BloggerCourse.com  Fact Sheet for Healthcare Providers: SeriousBroker.it  This test is not yet approved or cleared by the [place] FDA and has been authorized for detection and/or diagnosis of SARS-CoV-2 by FDA under an Emergency Use Authorization (EUA). This EUA will remain in effect (meaning this test can be used) for the duration of the COVID-19 declaration under Section 564(b)(1) of the Act, 21 U.S.C. section 360bbb-3(b)(1), unless the authorization is terminated or revoked.  Performed at [facility], [address], [place], [place] [address]       Radiology Studies: DG Abd Portable 1V  Result Date: 02/04/2021 CLINICAL DATA:  Pain.  Recent GI bleed.  Abdominal pain. EXAM: PORTABLE ABDOMEN - 1 VIEW COMPARISON:  CT 05/04/2017.  Renal ultrasound 02/01/2021 FINDINGS: High-density material throughout the colon consistent with ingested material. No visualized radiopaque calculi, with evaluation of the renal beds partially obscured by this high-density material. No bowel dilatation to suggest obstruction. No concerning intraabdominal mass effect. Right hip arthroplasty. Degenerative change in the spine. IMPRESSION: 1. Nonobstructive bowel gas pattern. High-density material throughout the colon consistent with ingested contrast or bismuth containing products. 2. Punctate left renal calculi on prior CT are not seen, evaluation of the renal fossa partially obscured by overlying high-density material in the colon. Electronically Signed   By: Narda Rutherford M.D.   On: 02/04/2021 19:05    Pamella Pert, MD, PhD [facility]  Between 7 am - 7 pm I am available, please contact me via Amion (for emergencies) or Securechat (non urgent messages)  Between 7 pm - 7 am I am not available, please contact night coverage MD/APP via Amion

## 2021-02-05 NOTE — Progress Notes (Signed)
[facility] Progress Note  Subjective: seen in room, still voiding "a lot" but not all collected I believe, had bad episode SOB today after lying flat for a while. Hasn't slept in 3 nights, "that's the main problem" per the patient. Still SOB.   Vitals:   02/05/21 0431 02/05/21 0939 02/05/21 1000 02/05/21 1233  BP: (!) 171/93 (!) 165/86 (!) 160/85 (!) 155/87  Pulse: 64 68 69 67  Resp: $Remo'12  20 18  'cpnLp$ Temp: 97.9 F (36.6 C)  98.7 F (37.1 C) 98.5 F (36.9 C)  TempSrc: Oral  Oral Oral  SpO2: 94%   93%  Weight:      Height:        Exam: Gen alert, not in distress No jvd or bruits Chest rales bilat bases RRR no MRG Abd soft ntnd no mass or ascites +bs Ext 2+ pitting pretib edema bilat  Bilat TMA wounds are dressed  Neuro is alert, Ox 3 , nf      Date                           Creat               eGFR   2009- 2015                0.90- 1.21   2016- 2017                1.02- 1.48   2018                          1.15- 1.64   2019                          1.12- 1.30        62- 71   Sept 16, 2022           1.97                 39   Sept 17                      1.94                 40    Sept 18                     2.05                 37      Home meds include asa, trulicity, glucotrol, norco prn, crestor, zanaflex prn, uloric, neurontin, lantus insulin 36 bid, lisiniopril $RemoveBeforeDE'5mg'[id number]$  qd     Renal US - 12.6 / 11.9 cm kidneys. Mild L hydro, bladder not distended, bilat jets not seen.     UA prot >300, 0-5 rbc/ wbc    UNa 51, UCr 95    CXR 9/18 today - significant bilat CHF (official reading pend)       Assessment/ Plan: AKI - b/l creat in 2019 was 1.12- 1.30, eGFR 62-71. Creat here 1.9 three yrs later in setting of marked vol overload w/ pulm edema/ SOB/ LE edema. Renal US shows mild L hydro, nothing on R. Hx of multiple kidney stones over the years. UA +proteinuria, urine lytes c/w CKD.  Suspect CKD now and probably cardiorenal syndrome. Creat stable here 1.9-2.1. Seems to  be responding to  high dose diuretics. Was not on diuretics at home prior to this, will need maintenance diuretics upon dc. Still unable to lie flat. Will  repeat CXR and ^lasix to 160 tid.  CAD - recently dx'd by [facility]. Has cardiologist and PCP at [facility]. Cardiology consulting here.  PAD sp bilat TMA - recently in [place] H/o kidney stones - has had stents placed / removed in the past  HTN - holding lisinopril for now, getting toprol xl 50 qd  IDDM - per pmd Volume - vol overload as above Anemia - Hb 7- 8 range, sp massive GI bleed during recent hosp stay in [place] 02/05/2021, 12:51 PM   Recent Labs  Lab 02/04/21 0315 02/05/21 0352  K 4.4 4.5  BUN 100* 105*  CREATININE 1.98* 1.99*  CALCIUM 8.2* 8.1*  HGB 8.5* 7.9*    Inpatient medications:  aspirin EC  81 mg Oral Daily   insulin aspart  0-15 Units Subcutaneous TID WC   insulin aspart  0-5 Units Subcutaneous QHS   insulin glargine-yfgn  5 Units Subcutaneous Daily   isosorbide mononitrate  30 mg Oral Daily   metolazone  10 mg Oral Daily   metoprolol succinate  50 mg Oral Daily   pantoprazole  40 mg Oral BID   rosuvastatin  20 mg Oral QPM    furosemide     acetaminophen **OR** acetaminophen, hydrALAZINE, HYDROcodone-acetaminophen, prochlorperazine

## 2021-02-05 NOTE — Progress Notes (Signed)
Inpatient Diabetes Program Recommendations  AACE/ADA: New Consensus Statement on Inpatient Glycemic Control (2015)  Target Ranges:  Prepandial:   less than 140 mg/dL      Peak postprandial:   less than 180 mg/dL (1-2 hours)      Critically ill patients:  140 - 180 mg/dL   Lab Results  Component Value Date   GLUCAP 313 (H) 02/05/2021   HGBA1C 6.3 (H) 02/01/2021    Review of Glycemic Control Results for [name] Snow, [name] Snow (MRN [id number]) as of 02/05/2021 09:38  Ref. Range 02/04/2021 07:58 02/04/2021 12:14 02/04/2021 17:23 02/04/2021 21:08 02/05/2021 07:48  Glucose-Capillary Latest Ref Range: 70 - 99 mg/dL 709 (H) 628 (H) 366 (H) 140 (H) 313 (H)   Diabetes history: DM 2 Outpatient Diabetes medications:  Glucotrol 10 mg daily, Lantus 36 units bid (not taking) Current orders for Inpatient glycemic control:  Novolog moderate tid with meals and HS  Inpatient Diabetes Program Recommendations:   May consider adding Semglee 10 units daily.   Thanks,  Beryl Meager, RN, BC-ADM Inpatient Diabetes Coordinator Pager [phone]  (8a-5p)

## 2021-02-06 ENCOUNTER — Inpatient Hospital Stay (HOSPITAL_COMMUNITY): Payer: Medicare Other

## 2021-02-06 DIAGNOSIS — I5033 Acute on chronic diastolic (congestive) heart failure: Secondary | ICD-10-CM | POA: Diagnosis not present

## 2021-02-06 DIAGNOSIS — M869 Osteomyelitis, unspecified: Secondary | ICD-10-CM

## 2021-02-06 DIAGNOSIS — D649 Anemia, unspecified: Secondary | ICD-10-CM | POA: Diagnosis not present

## 2021-02-06 DIAGNOSIS — R778 Other specified abnormalities of plasma proteins: Secondary | ICD-10-CM | POA: Diagnosis not present

## 2021-02-06 LAB — BASIC METABOLIC PANEL
Anion gap: 12 (ref 5–15)
BUN: 111 mg/dL — ABNORMAL HIGH (ref 6–20)
CO2: 25 mmol/L (ref 22–32)
Calcium: 8.4 mg/dL — ABNORMAL LOW (ref 8.9–10.3)
Chloride: 101 mmol/L (ref 98–111)
Creatinine, Ser: 2.18 mg/dL — ABNORMAL HIGH (ref 0.61–1.24)
GFR, Estimated: 34 mL/min — ABNORMAL LOW (ref >60)
Glucose, Bld: 141 mg/dL — ABNORMAL HIGH (ref 70–99)
Potassium: 4.3 mmol/L (ref 3.5–5.1)
Sodium: 138 mmol/L (ref 135–145)

## 2021-02-06 LAB — MAGNESIUM: Magnesium: 1.9 mg/dL (ref 1.7–2.4)

## 2021-02-06 LAB — CBC
HCT: 29.1 % — ABNORMAL LOW (ref 39.0–52.0)
Hemoglobin: 9.4 g/dL — ABNORMAL LOW (ref 13.0–17.0)
MCH: 28.8 pg (ref 26.0–34.0)
MCHC: 32.3 g/dL (ref 30.0–36.0)
MCV: 89.3 fL (ref 80.0–100.0)
Platelets: 233 10*3/uL (ref 150–400)
RBC: 3.26 MIL/uL — ABNORMAL LOW (ref 4.22–5.81)
RDW: 16.8 % — ABNORMAL HIGH (ref 11.5–15.5)
WBC: 11.4 10*3/uL — ABNORMAL HIGH (ref 4.0–10.5)
nRBC: 0 % (ref 0.0–0.2)

## 2021-02-06 LAB — GLUCOSE, CAPILLARY
Glucose-Capillary: 143 mg/dL — ABNORMAL HIGH (ref 70–99)
Glucose-Capillary: 158 mg/dL — ABNORMAL HIGH (ref 70–99)
Glucose-Capillary: 156 mg/dL — ABNORMAL HIGH (ref 70–99)
Glucose-Capillary: 186 mg/dL — ABNORMAL HIGH (ref 70–99)

## 2021-02-06 MED ORDER — ISOSORB DINITRATE-HYDRALAZINE 20-37.5 MG PO TABS
1.0000 | ORAL_TABLET | Freq: Three times a day (TID) | ORAL | Status: DC
  Administered 2021-02-06 – 2021-02-08 (×7): 1 via ORAL
  Filled 2021-02-06 (×9): qty 1

## 2021-02-06 MED ORDER — FUROSEMIDE 10 MG/ML IJ SOLN
120.0000 mg | Freq: Three times a day (TID) | INTRAVENOUS | Status: DC
  Administered 2021-02-06 – 2021-02-08 (×5): 120 mg via INTRAVENOUS
  Filled 2021-02-06: qty 10
  Filled 2021-02-06 (×2): qty 12
  Filled 2021-02-06: qty 10
  Filled 2021-02-06 (×3): qty 12
  Filled 2021-02-06 (×2): qty 10
  Filled 2021-02-06: qty 12

## 2021-02-06 NOTE — Progress Notes (Addendum)
Subjective:  Breathing improved  Output not accurately documented  Objective:  Vital Signs in the last 24 hours: Temp:  [97.7 F (36.5 C)-98.7 F (37.1 C)] 97.8 F (36.6 C) (09/21 0519) Pulse Rate:  [63-70] 70 (09/21 0519) Resp:  [16-20] 16 (09/21 0519) BP: (155-165)/(85-95) 156/95 (09/21 0519) SpO2:  [89 %-94 %] 89 % (09/21 0519) Weight:  [115.8 kg] 115.8 kg (09/20 1327)  Intake/Output from previous day: 09/20 0701 - 09/21 0700 In: -  Out: 300 [Urine:300]  Physical Exam Vitals and nursing note reviewed.  Constitutional:      General: He is not in acute distress.    Appearance: He is well-developed.  HENT:     Head: Normocephalic and atraumatic.  Eyes:     Conjunctiva/sclera: Conjunctivae normal.     Pupils: Pupils are equal, round, and reactive to light.  Neck:     Vascular: No JVD.  Cardiovascular:     Rate and Rhythm: Normal rate and regular rhythm.     Pulses: Normal pulses and intact distal pulses.     Heart sounds: No murmur heard. Pulmonary:     Effort: Pulmonary effort is normal.     Breath sounds: Decreased air movement present. Examination of the right-lower field reveals decreased breath sounds. Examination of the left-lower field reveals decreased breath sounds. Decreased breath sounds present. No wheezing or rales.  Abdominal:     General: Bowel sounds are normal.     Palpations: Abdomen is soft.     Tenderness: There is no rebound.  Musculoskeletal:        General: No tenderness. Normal range of motion.     Right lower leg: Edema present.     Left lower leg: Edema present.     Comments: B/l transmetatarsal amputation Rt sided amputation wound, well healing  Lymphadenopathy:     Cervical: No cervical adenopathy.  Skin:    General: Skin is warm and dry.  Neurological:     Mental Status: He is alert and oriented to person, place, and time.     Cranial Nerves: No cranial nerve deficit.     Lab Results: BMP Recent Labs    02/04/21 0315  02/05/21 0352 02/06/21 0339  NA 139 138 138  K 4.4 4.5 4.3  CL 105 104 101  CO2 24 24 25   GLUCOSE 161* 133* 141*  BUN 100* 105* 111*  CREATININE 1.98* 1.99* 2.18*  CALCIUM 8.2* 8.1* 8.4*  GFRNONAA 39* 38* 34*     CBC Recent Labs  Lab 02/01/21 0646 02/01/21 1913 02/06/21 0339  WBC 10.4   < > 11.4*  RBC 2.93*   < > 3.26*  HGB 8.5*   < > 9.4*  HCT 27.3*   < > 29.1*  PLT 262   < > 233  MCV 93.2   < > 89.3  MCH 29.0   < > 28.8  MCHC 31.1   < > 32.3  RDW 14.8   < > 16.8*  LYMPHSABS 1.8  --   --   MONOABS 0.8  --   --   EOSABS 0.3  --   --   BASOSABS 0.0  --   --    < > = values in this interval not displayed.     HEMOGLOBIN A1C Lab Results  Component Value Date   HGBA1C 6.3 (H) 02/01/2021   MPG 134.11 02/01/2021    Cardiac Panel (last 3 results) No results for input(s): CKTOTAL, CKMB, TROPONINI, RELINDX in the last  8760 hours.  BNP (last 3 results) Recent Labs    02/01/21 0646  BNP 1,671.1*     TSH No results for input(s): TSH in the last 8760 hours.  Lipid Panel     Component Value Date/Time   CHOL 219 (H) 09/29/2017 1629   TRIG 117 09/29/2017 1629   HDL 41 09/29/2017 1629   CHOLHDL 5.3 (H) 09/29/2017 1629   CHOLHDL 4.3 12/14/2015 1306   VLDL 35 (H) 12/14/2015 1306   LDLCALC 155 (H) 09/29/2017 1629   LDLDIRECT (H) 09/02/2007 1330    127 (NOTE) ATP III Classification (LDL):      < 100        mg/dL         Optimal     259 - 129     mg/dL         Near or Above Optimal     130 - 159     mg/dL         Borderline High     160 - 189     mg/dL         High      > 563        mg/dL         Very  High      Hepatic Function Panel Recent Labs    02/03/21 0553 02/04/21 0315 02/05/21 0352  PROT 7.4 7.5 6.7  ALBUMIN 2.3* 2.5* 2.3*  AST 14* 19 16  ALT 6 6 8   ALKPHOS 65 60 66  BILITOT 0.6 1.4* 1.4*     CXR 02/03/2021: Increase in bibasilar airspace disease representing edema versus multifocal pneumonia.   Ultrasound of the abdomen  02/01/2021: Increase in bibasilar airspace disease representing edema versus multifocal pneumonia.    EKG 02/04/2021:  Normal sinus rhythm at rate of 79 bpm, right axis deviation, cannot exclude high lateral infarct old.  Consider lead misplacement.  Lead I and aVR.  Compared to 02/01/2021, lead reversal is not present otherwise normal sinus rhythm, old inferior infarct and poor R wave progression, cannot exclude old anterolateral infarct.    Echocardiogram 02/02/2021:  1. Left ventricular ejection fraction, by estimation, is 50 to 55%. The left ventricle has low normal function. The left ventricle demonstrates regional wall motion abnormalities (see scoring diagram/findings for description). There is mild left  ventricular hypertrophy. Left ventricular diastolic parameters were normal.  2. Right ventricular systolic function is normal. The right ventricular size is normal. Tricuspid regurgitation signal is inadequate for assessing PA pressure.  3. Left atrial size was severely dilated.  4. Right atrial size was mildly dilated.  5. The mitral valve is grossly normal. Mild mitral valve regurgitation.  6. The aortic valve is tricuspid. Aortic valve regurgitation is not visualized. No aortic stenosis ipresent. Aortic valve mean gradient measures 4.0 mmHg.  7. The inferior vena cava is dilated in size with >50% respiratory variability, suggesting right atrial pressure of 8 mmHg.  Left Heart Catheterization 08/22/2020: ANGIOGRAM/CORONARY ARTERIOGRAM:    There was acute total occlusion in the mid right coronary artery.  There was  severe disease in the mid left anterior descending.  There was  severe disease in the mid   circumflex.  There was  moderate disease in the ostial posterior descending artery.   LEFT VENTRICULOGRAM:  Left ventriculography showed a segmental abnormality in the inferior wall  with an estimated EF of  40-45%   PCI:    A 3.0 mm, X 23 mm Xience  Skypoint DES was placed in  the Mid right coronary  artery, with subsequent dilatation up to 3.5 mm  A 3.5 mm by 23 mm Xience Skypoint DES was placed in the Proximal right  coronary artery, where there appeared to be some dissection perhaps from  the guide catheter at the site of a moderate stenosis.  A PTCA with a 2.5 mm, X 15 mm balloon was performed in the ostial and  proximal posterior descending artery for probable thrombus embolization  from the STEMI lesion.     Assessment & Recommendations:  55 year old Caucasian male with uncontrolled hypertension, type 2 diabetes mellitus, CAD (recent primary PCI for RCA 08/2020, Lcx + LAD and circumflex disease awaiting CABG, preferred due to concern for medication noncompliance), diabetic foot ulcer s/p b/l metatarsal amputation, recent AKI with underlying CKD stage III, now admitted with acute on chronic HFpEF, troponin elevation  Acute on chronic HFpEF: Diuresing, but amount not documented. Continue as per nephrology recommendations.   CAD: Mild troponin elevation, likely type 2 MI than ACS. Continue Aspirin 81 mg, rosuvastatin 20 mg.  Continue metoprolol succinate 25 mg daily.  Changing Imdur 30 mg to Bidil, as below. Residual Lcx and LAD disease. In long term, CABG will still be a better option given compliance concerns. Recommend re-establishing with Dr. Ty Hilts and cardiologist in [facility] system.  Hypertension: Remains uncontrolled. Added Bidil 20-37.5 mg tid to help with afterload reduction and hypertension Unable to use ACE/ARNI/ARB/AA given increasing creatinine.    Elder Negus, MD Pager: [phone] Office: [phone]

## 2021-02-06 NOTE — TOC Progression Note (Signed)
Transition of Care [facility]) - Progression Note    Patient Details  Name: [name] Snow MRN: [id number] Date of Birth: [birth year]  [facility] CM/SW Contact  Geni Bers, RN Phone Number: 02/06/2021, 3:54 PM  Clinical Narrative:    TOC will continue to follow pt. Pt is home with relatives.    Expected Discharge Plan: Home/Self Care Barriers to Discharge: No Barriers Identified  Expected Discharge Plan and Services Expected Discharge Plan: Home/Self Care       Living arrangements for the past 2 months: Single Family Home                                       Social Determinants of Health (SDOH) Interventions    Readmission Risk Interventions No flowsheet data found.

## 2021-02-06 NOTE — Progress Notes (Signed)
[facility] transporting the patient to [facility] at [facility].

## 2021-02-06 NOTE — Progress Notes (Signed)
[facility] Progress Note  Subjective: pt states voided 4 quarts yesterday and overnight, down 8 lbs from yesterday and breathing continues to imrpove. Was able to lie down for MRI today.   Vitals:   02/05/21 1327 02/05/21 1954 02/06/21 0519 02/06/21 0953  BP:  (!) 155/85 (!) 156/95   Pulse:  63 70   Resp:  20 16   Temp:  97.7 F (36.5 C) 97.8 F (36.6 C)   TempSrc:  Oral Oral   SpO2:  94% (!) 89%   Weight: 115.8 kg   112.4 kg  Height:        Exam: Gen alert, not in distress No jvd or bruits Chest mostly clear bilat RRR no MRG Abd soft ntnd no mass or ascites +bs Ext 2+ pitting pretib edema bilat  Bilat TMA wounds are dressed  Neuro is alert, Ox 3 , nf      Date                           Creat               eGFR   2009- 2015                0.90- 1.21   2016- 2017                1.02- 1.48   2018                          1.15- 1.64   2019                          1.12- 1.30        62- 71   Sept 16, 2022           1.97                 39   Sept 17                      1.94                 40    Sept 18                     2.05                 37      Home meds include asa, trulicity, glucotrol, norco prn, crestor, zanaflex prn, uloric, neurontin, lantus insulin 36 bid, lisiniopril 44m qd     Renal [place]- 12.6 / 11.9 cm kidneys. Mild L hydro, bladder not distended, bilat jets not seen.     UA prot >300, 0-5 rbc/ wbc    UNa 51, UCr 95    CXR 9/18 today - significant bilat CHF (official reading pend)       Assessment/ Plan: AKI - b/l creat in 2019 was 1.12- 1.30, eGFR 62-71. Creat here 1.9 three yrs later in setting of marked vol overload w/ pulm edema/ SOB/ LE edema. Renal [place] mild L hydro, nothing on R. Hx of multiple kidney stones over the years. UA +proteinuria, urine lytes c/w CKD.  Suspect he has CKD now and possibly cardiorenal syndrome. Was started on high-dose IV lasix on 9/18. SOB improving daily, wt's down 12 lbs, able to lie down now. Legs  still quite swollen. Will lower lasix to 162m IV tid, possibly switch to po diuretics soon if continues to improve.  CAD - recently dx'd by [facility]. [facility].  [place] / removed in the past  HTN - holding lisinopril for now, getting toprol xl 50 qd  IDDM - per pmd Volume - vol overload as above Anemia - Hb 7- 8 range, sp massive GI [place] 1[place]03 PM   Recent Labs  Lab 02/05/21 0352 02/06/21 0339  K 4.5 4.3  BUN 105* 111*  CREATININE 1.99* 2.18*  CALCIUM 8.1* 8.4*  HGB 7.9* 9.4*   Inpatient medications:  aspirin EC  81 mg Oral Daily   insulin aspart  0-15 Units Subcutaneous TID WC   insulin aspart  0-5 Units Subcutaneous QHS   insulin glargine-yfgn  5 Units Subcutaneous Daily   isosorbide-hydrALAZINE  1 tablet Oral TID   metolazone  10 mg Oral Daily   metoprolol succinate  50 mg Oral Daily   pantoprazole  40 mg Oral BID   rosuvastatin  20 mg Oral QPM    furosemide 160 mg (02/06/21 0319)   acetaminophen **OR** acetaminophen, hydrALAZINE, HYDROcodone-acetaminophen, prochlorperazine

## 2021-02-06 NOTE — Progress Notes (Signed)
Transfer report called to [facility] at [facility]. [facility] to be arranged for transport.Melton Alar, RN

## 2021-02-06 NOTE — Progress Notes (Signed)
PROGRESS NOTE  [name] Snow  VQX:[id number] DOB: 1965/12/06 DOA: 02/01/2021 PCP: Patient, No Pcp Per (Inactive)   Brief Narrative: 55 year old male with history of CAD, chronic kidney disease stage IIIa with baseline creatinine 1.2-1.3, obesity, hypertension, hyperlipidemia, comes into the hospital with complaints of weakness, shortness of breath, lower extremity swelling and difficulty laying flat.  Patient had a recent prolonged hospitalization for about 19 days in [place].  Apparently presented there for infected right foot and he is now status post metatarsal amputation.  He was given antibiotics in the setting of infection which led to renal failure and he required HD x1.  He tells me his creatinine was as high as 6.5 when he was hospitalized there and believes that it was around 4 when he left but he is not sure.  That hospital course was also complicated by GI bleed with bright red blood per rectum/melena for which he underwent a GI evaluation with EGD, colonoscopy as well as a capsule study and apparently without a clear-cut source of his bleed.  He required a total of 6 units of packed red blood cells.  He was telling me he was supposed to leave with IV antibiotics and PICC line but did and does not want antibiotics given the fact that they caused his renal failure to begin with.  He is also had a recent STEMI in April, was hospitalized at [facility] and underwent stenting of the proximal and mid RCA with PTCA to the ostial and proximal PDA.  EF was 40-45%.  He also was found to have severe three-vessel disease with severe stenosis of the OM and LAD/D1 and currently is in the process of getting bypass surgery but it was delayed due to his hospitalization  With cardiology and nephrology consultations, the patient has been effectively diuresing with improvement in orthopnea which has allowed MRI of the right foot on 9/21. This showed changes consistent with osteomyelitis of the 1st, 2nd,  3rd metatarsals for which orthopedics has been consulted. Plan is to transfer to [facility] for evaluation by Dr. [name] and ID consult to follow.   Assessment & Plan: Active Problems:   Coronary artery disease   Symptomatic anemia   Acute on chronic heart failure with preserved ejection fraction (HCC)   Elevated troponin  Acute on chronic combined CHF due to acute pulmonary edema in the setting of acute on chronic diastolic CHF-patient has been admitted to the hospital for elevated BNP, fluid overload -2D echo done 9/17 showed EF 50-55%, low normal, as well as regional WMA.  It also showed mild LVH, normal RV. -Continue aggressive diuresis, clinically improving and creatinine fortunately is remaining stable. Deescalate IV lasix dose today per nephrology. -Strict ins and outs, daily weights     Osteomyelitis of right foot metatarsals: 1st, 2nd, 2rd MT's with changes suggestive of osteomyelitis, edema, and cellulitis with myofasciitis seen on MRI 9/21.  - Has had metatarsal amputation somewhat recently.  Wound care consulted, no odor, drainage, fluctuance or open areas.   - Patient relates he was told the plan was to continue IV antibiotics thru Oct 10. His white count is normal and he is afebrile. We will continue to hold antibiotics. This was discussed with ID. - D/w orthopedics Dr. Ophelia Charter and Dr. [name]. Should have opening in OR schedule for 9/23, request transfer to [facility].   Coronary artery disease, severe three-vessel disease, troponin elevation, NSTEMI type II-patient established care with Dr. Ty Hilts at [facility] for CABG in the near  future. -Currently his CAD appears stable, he has no chest pain or ACS type symptoms.  -Patient has some back pain 9/18.  Troponin was checked and it was in the 300 range yesterday climbing up to 1200. Cardiology, Dr. Rosemary Holms consulted. Currently not a candidate for anticoagulation/antiplatelets given recent GI bleed -Added metoprolol, Imdur  Acute kidney injury on  chronic kidney disease stage IIIa  -Patient's creatinine on admission was 1.9 from baseline about 1.2-1.4.  He was recently hospitalized earlier this month requiring HD.  Patient recalls creatinine around 4 from his discharge but up to 6 at one-point -Creatinine overall stable with diuresis, continue to monitor, suspect that his baseline will now be around 2   Hyperkalemia -Resolved, continue to monitor   Normocytic anemia, recent GI bleed-fecal occult is positive however he is clinically without bleeding.  Stools are brown and there is no signs of melena/bright red blood.  GI consulted, and did not recommend any further work-up at this point but only close monitoring. -He was transfused unit of packed red blood cells on 9/18 for hemoglobin of 7.7.  Continue to closely monitor hemoglobin  Hyperlipidemia-continue statin   Hypertension-hold ACE inhibitor's   DM2 - A1c, SSI, glucose checks, DM diet  Obesity: Estimated body mass index is 34.56 kg/m as calculated from the following:   Height as of this encounter: 5\' 11"  (1.803 m).   Weight as of this encounter: 112.4 kg.  DVT prophylaxis: SCDs Code Status: Full Family Communication: On phone during encounter Disposition Plan:  Status is: Inpatient  Remains inpatient appropriate because:Inpatient level of care appropriate due to severity of illness  Dispo: The patient is from: Home              Anticipated d/c is to: Home              Patient currently is not medically stable to d/c.  Consultants:  Orthopedics ID Nephrology Cardiology  Procedures:  TBD  Antimicrobials: None   Subjective: Pt is insensate in feet, no pain. He denies fever, chills. Still with some weakness and short of breath with exertion, though orthopnea has significantly improved.   Objective: Vitals:   02/05/21 1954 02/06/21 0519 02/06/21 0953 02/06/21 1353  BP: (!) 155/85 (!) 156/95  136/73  Pulse: 63 70  68  Resp: 20 16    Temp: 97.7 F (36.5 C)  97.8 F (36.6 C)  97.6 F (36.4 C)  TempSrc: Oral Oral  Oral  SpO2: 94% (!) 89%  97%  Weight:   112.4 kg   Height:        Intake/Output Summary (Last 24 hours) at 02/06/2021 1746 Last data filed at 02/06/2021 0900 Gross per 24 hour  Intake 542 ml  Output --  Net 542 ml   Filed Weights   02/04/21 0447 02/05/21 1327 02/06/21 0953  Weight: 116.8 kg 115.8 kg 112.4 kg   Gen: 55 y.o. male in no distress  Pulm: Non-labored breathing room air. Clear to auscultation bilaterally.  CV: Regular rate and rhythm. No murmur, rub, or gallop. No JVD, 2+ pitting dependent bilateral LE edema. GI: Abdomen soft, non-tender, non-distended, with normoactive bowel sounds. No organomegaly or masses felt. Ext: Warm, dry, bilateral transmetatarsal amputations noted Skin: Right foot with healed surgical incision with some spreading erythema without fluctuance or exudate.  Neuro: Alert and oriented. No focal neurological deficits. Psych: Judgement and insight appear normal. Mood & affect appropriate.   Data Reviewed: I have personally reviewed following labs and imaging  studies  CBC: Recent Labs  Lab 02/01/21 0646 02/01/21 1913 02/02/21 0236 02/03/21 0553 02/04/21 0315 02/05/21 0352 02/06/21 0339  WBC 10.4  --  9.7 9.5 11.0* 9.1 11.4*  NEUTROABS 7.5  --   --   --   --   --   --   HGB 8.5*   < > 7.9* 7.7* 8.5* 7.9* 9.4*  HCT 27.3*   < > 24.8* 24.7* 26.5* 24.8* 29.1*  MCV 93.2  --  91.9 91.8 88.6 89.9 89.3  PLT 262  --  243 219 205 198 233   < > = values in this interval not displayed.   Basic Metabolic Panel: Recent Labs  Lab 02/02/21 0236 02/03/21 0553 02/04/21 0315 02/05/21 0352 02/06/21 0339  NA 141 140 139 138 138  K 5.4* 5.4* 4.4 4.5 4.3  CL 109 106 105 104 101  CO2 24 27 24 24 25   GLUCOSE 142* 149* 161* 133* 141*  BUN 121* 106* 100* 105* 111*  CREATININE 1.94* 2.05* 1.98* 1.99* 2.18*  CALCIUM 8.3* 8.2* 8.2* 8.1* 8.4*  MG  --  2.3  --  2.0 1.9   GFR: Estimated Creatinine  Clearance: 47.6 mL/min (A) (by C-G formula based on SCr of 2.18 mg/dL (H)). Liver Function Tests: Recent Labs  Lab 02/01/21 0646 02/02/21 0236 02/03/21 0553 02/04/21 0315 02/05/21 0352  AST 16 13* 14* 19 16  ALT 6 5 6 6 8   ALKPHOS 80 70 65 60 66  BILITOT 1.1 0.7 0.6 1.4* 1.4*  PROT 8.2* 7.7 7.4 7.5 6.7  ALBUMIN 2.5* 2.3* 2.3* 2.5* 2.3*   No results for input(s): LIPASE, AMYLASE in the last 168 hours. No results for input(s): AMMONIA in the last 168 hours. Coagulation Profile: No results for input(s): INR, PROTIME in the last 168 hours. Cardiac Enzymes: No results for input(s): CKTOTAL, CKMB, CKMBINDEX, TROPONINI in the last 168 hours. BNP (last 3 results) No results for input(s): PROBNP in the last 8760 hours. HbA1C: No results for input(s): HGBA1C in the last 72 hours. CBG: Recent Labs  Lab 02/05/21 1125 02/05/21 1633 02/05/21 1945 02/06/21 0741 02/06/21 1232  GLUCAP 135* 153* 137* 143* 158*   Lipid Profile: No results for input(s): CHOL, HDL, LDLCALC, TRIG, CHOLHDL, LDLDIRECT in the last 72 hours. Thyroid Function Tests: No results for input(s): TSH, T4TOTAL, FREET4, T3FREE, THYROIDAB in the last 72 hours. Anemia Panel: No results for input(s): VITAMINB12, FOLATE, FERRITIN, TIBC, IRON, RETICCTPCT in the last 72 hours. Urine analysis:    Component Value Date/Time   COLORURINE YELLOW 02/03/2021 1141   APPEARANCEUR CLEAR 02/03/2021 1141   LABSPEC 1.020 02/03/2021 1141   PHURINE 6.0 02/03/2021 1141   GLUCOSEU NEGATIVE 02/03/2021 1141   HGBUR LARGE (A) 02/03/2021 1141   BILIRUBINUR NEGATIVE 02/03/2021 1141   BILIRUBINUR negative 09/30/2016 1358   BILIRUBINUR neg 06/17/2014 0935   KETONESUR NEGATIVE 02/03/2021 1141   PROTEINUR >=300 (A) 02/03/2021 1141   UROBILINOGEN 0.2 09/30/2016 1358   UROBILINOGEN 0.2 06/11/2010 2040   NITRITE NEGATIVE 02/03/2021 1141   LEUKOCYTESUR TRACE (A) 02/03/2021 1141   Recent Results (from the past 240 hour(s))  Resp Panel by  RT-PCR (Flu A&B, Covid) Nasopharyngeal Swab     Status: None   Collection Time: 02/01/21  7:42 AM   Specimen: Nasopharyngeal Swab; Nasopharyngeal(NP) swabs in vial transport medium  Result Value Ref Range Status   SARS Coronavirus 2 by RT PCR NEGATIVE NEGATIVE Final    Comment: (NOTE) SARS-CoV-2 target nucleic acids are NOT  DETECTED.  The SARS-CoV-2 RNA is generally detectable in upper respiratory specimens during the acute phase of infection. The lowest concentration of SARS-CoV-2 viral copies this assay can detect is 138 copies/mL. A negative result does not preclude SARS-Cov-2 infection and should not be used as the sole basis for treatment or other patient management decisions. A negative result may occur with  improper specimen collection/handling, submission of specimen other than nasopharyngeal swab, presence of viral mutation(s) within the areas targeted by this assay, and inadequate number of viral copies(<138 copies/mL). A negative result must be combined with clinical observations, patient history, and epidemiological information. The expected result is Negative.  Fact Sheet for Patients:  BloggerCourse.com  Fact Sheet for Healthcare Providers:  SeriousBroker.it  This test is no t yet approved or cleared by the [place] FDA and  has been authorized for detection and/or diagnosis of SARS-CoV-2 by FDA under an Emergency Use Authorization (EUA). This EUA will remain  in effect (meaning this test can be used) for the duration of the COVID-19 declaration under Section 564(b)(1) of the Act, 21 U.S.C.section 360bbb-3(b)(1), unless the authorization is terminated  or revoked sooner.       Influenza A by PCR NEGATIVE NEGATIVE Final   Influenza B by PCR NEGATIVE NEGATIVE Final    Comment: (NOTE) The Xpert Xpress SARS-CoV-2/FLU/RSV plus assay is intended as an aid in the diagnosis of influenza from Nasopharyngeal swab  specimens and should not be used as a sole basis for treatment. Nasal washings and aspirates are unacceptable for Xpert Xpress SARS-CoV-2/FLU/RSV testing.  Fact Sheet for Patients: BloggerCourse.com  Fact Sheet for Healthcare Providers: SeriousBroker.it  This test is not yet approved or cleared by the [place] FDA and has been authorized for detection and/or diagnosis of SARS-CoV-2 by FDA under an Emergency Use Authorization (EUA). This EUA will remain in effect (meaning this test can be used) for the duration of the COVID-19 declaration under Section 564(b)(1) of the Act, 21 U.S.C. section 360bbb-3(b)(1), unless the authorization is terminated or revoked.  Performed at [facility], [address], [place], [place] [address]       Radiology Studies: MR FOOT RIGHT WO CONTRAST  Result Date: 02/06/2021 CLINICAL DATA:  Diabetic with foot pain and swelling. History of prior mid metatarsal amputations. EXAM: MRI OF THE RIGHT FOREFOOT WITHOUT CONTRAST TECHNIQUE: Multiplanar, multisequence MR imaging of the right foot was performed. No intravenous contrast was administered. COMPARISON:  Prior MRI from 2018 FINDINGS: Mid metatarsal amputations are noted. There is diffuse abnormal T1 and T2 signal intensity in the first, second and third metatarsals highly suspicious for osteomyelitis. The fourth and fifth metatarsals are unremarkable. Diffuse subcutaneous soft tissue swelling/edema/fluid suggesting cellulitis. No discrete fluid collection to suggest a drainable soft tissue abscess. Diffuse myofasciitis without definite findings for pyomyositis. Moderate midfoot (tarsal metatarsal) degenerative changes but no findings to suggest septic arthritis. The tibiotalar and subtalar joints are maintained. No findings for hindfoot septic arthritis or osteomyelitis. Slight thickening and increased T2 signal intensity in the plantar  fascia near its attachment site suggesting mild-to-moderate plantar fasciitis. The major ankle tendons and ligaments are grossly intact. IMPRESSION: 1. Findings highly suspicious for osteomyelitis involving the first, second and third metatarsals. 2. Diffuse cellulitis and myofasciitis without definite findings for subcutaneous abscess or pyomyositis. 3. No findings for septic arthritis or osteomyelitis involving the midfoot or hindfoot. 4. Plantar fasciitis. Electronically Signed   By: Rudie Meyer M.D.   On: 02/06/2021 13:41   DG CHEST  PORT 1 VIEW  Result Date: 02/05/2021 CLINICAL DATA:  Follow-up pulmonary edema EXAM: PORTABLE CHEST 1 VIEW COMPARISON:  02/03/2021 FINDINGS: Check shadow is stable. Significant improvement in vascular congestion and pulmonary edema is noted bilaterally. Small effusions are again seen as well as some mild bibasilar atelectatic changes. No bony abnormality is noted. IMPRESSION: Significant improvement of previously seen vascular congestion and edema. Electronically Signed   By: Alcide Clever M.D.   On: 02/05/2021 15:50    Scheduled Meds:  aspirin EC  81 mg Oral Daily   insulin aspart  0-15 Units Subcutaneous TID WC   insulin aspart  0-5 Units Subcutaneous QHS   insulin glargine-yfgn  5 Units Subcutaneous Daily   isosorbide-hydrALAZINE  1 tablet Oral TID   metolazone  10 mg Oral Daily   metoprolol succinate  50 mg Oral Daily   pantoprazole  40 mg Oral BID   rosuvastatin  20 mg Oral QPM   Continuous Infusions:  furosemide 120 mg (02/06/21 1419)     LOS: 5 days   Time spent: 35 minutes.  Tyrone Nine, MD [facility] [email or web address] 02/06/2021, 5:46 PM

## 2021-02-06 NOTE — Progress Notes (Signed)
Id brief note   Patient recent tma of right foot (don't see operative note for [place]; unclear where done); mri 9/21 suspicious OM changes 1st, 2nd, 3rd mt without abscess  Not septic  Not currently on abx this admission    A/p Possible diabetic foot infection  -crp/esr -agree surgical evaluation with ortho  -agree continuing to hold abx -ID input more helpful after surgical evaluation -dr Tommy Medal will be back this pm vs tomorrow to formally see patient

## 2021-02-07 DIAGNOSIS — R7881 Bacteremia: Secondary | ICD-10-CM

## 2021-02-07 DIAGNOSIS — B9562 Methicillin resistant Staphylococcus aureus infection as the cause of diseases classified elsewhere: Secondary | ICD-10-CM

## 2021-02-07 DIAGNOSIS — M86471 Chronic osteomyelitis with draining sinus, right ankle and foot: Secondary | ICD-10-CM | POA: Diagnosis not present

## 2021-02-07 DIAGNOSIS — N179 Acute kidney failure, unspecified: Principal | ICD-10-CM

## 2021-02-07 DIAGNOSIS — I509 Heart failure, unspecified: Secondary | ICD-10-CM

## 2021-02-07 DIAGNOSIS — D649 Anemia, unspecified: Secondary | ICD-10-CM

## 2021-02-07 LAB — CBC
HCT: 25.9 % — ABNORMAL LOW (ref 39.0–52.0)
Hemoglobin: 8.4 g/dL — ABNORMAL LOW (ref 13.0–17.0)
MCH: 28.9 pg (ref 26.0–34.0)
MCHC: 32.4 g/dL (ref 30.0–36.0)
MCV: 89 fL (ref 80.0–100.0)
Platelets: 225 10*3/uL (ref 150–400)
RBC: 2.91 MIL/uL — ABNORMAL LOW (ref 4.22–5.81)
RDW: 16.8 % — ABNORMAL HIGH (ref 11.5–15.5)
WBC: 12.3 10*3/uL — ABNORMAL HIGH (ref 4.0–10.5)
nRBC: 0 % (ref 0.0–0.2)

## 2021-02-07 LAB — BASIC METABOLIC PANEL
Anion gap: 10 (ref 5–15)
BUN: 99 mg/dL — ABNORMAL HIGH (ref 6–20)
CO2: 29 mmol/L (ref 22–32)
Calcium: 8 mg/dL — ABNORMAL LOW (ref 8.9–10.3)
Chloride: 98 mmol/L (ref 98–111)
Creatinine, Ser: 2.3 mg/dL — ABNORMAL HIGH (ref 0.61–1.24)
GFR, Estimated: 32 mL/min — ABNORMAL LOW (ref >60)
Glucose, Bld: 148 mg/dL — ABNORMAL HIGH (ref 70–99)
Potassium: 3.8 mmol/L (ref 3.5–5.1)
Sodium: 137 mmol/L (ref 135–145)

## 2021-02-07 LAB — GLUCOSE, CAPILLARY
Glucose-Capillary: 151 mg/dL — ABNORMAL HIGH (ref 70–99)
Glucose-Capillary: 194 mg/dL — ABNORMAL HIGH (ref 70–99)
Glucose-Capillary: 134 mg/dL — ABNORMAL HIGH (ref 70–99)
Glucose-Capillary: 158 mg/dL — ABNORMAL HIGH (ref 70–99)

## 2021-02-07 MED ORDER — SODIUM CHLORIDE 0.9 % IV SOLN
8.0000 mg/kg | Freq: Every day | INTRAVENOUS | Status: DC
  Filled 2021-02-07: qty 14

## 2021-02-07 MED ORDER — LINEZOLID 600 MG PO TABS
600.0000 mg | ORAL_TABLET | Freq: Two times a day (BID) | ORAL | Status: DC
  Administered 2021-02-07 – 2021-02-08 (×2): 600 mg via ORAL
  Filled 2021-02-07 (×3): qty 1

## 2021-02-07 NOTE — TOC Progression Note (Signed)
Transition of Care [facility]) - Initial/Assessment Note    Patient Details  Name: [name] Snow MRN: [id number] Date of Birth: [birth year]  [facility] of [facility]) CM/SW Contact:    Ralene Bathe, LCSWA Phone Number: 02/07/2021, 9:58 AM  Clinical Narrative:                 CSW following patient for any d/c planning needs once medically stable.  Patient scheduled to have BKA tomorrow.  Pending PT/OT notes after surgery.  Cleon Gustin, MSW, LCSWA   Expected Discharge Plan: Home/Self Care Barriers to Discharge: No Barriers Identified   Patient Goals and CMS Choice Patient states their goals for this hospitalization and ongoing recovery are:: To get better CMS Medicare.gov Compare Post Acute Care list provided to:: Patient Choice offered to / list presented to : Patient  Expected Discharge Plan and Services Expected Discharge Plan: Home/Self Care       Living arrangements for the past 2 months: Single Family Home                                      Prior Living Arrangements/Services Living arrangements for the past 2 months: Single Family Home Lives with:: Relatives Patient language and need for interpreter reviewed:: No Do you feel safe going back to the place where you live?: Yes        Care giver support system in place?: Yes (comment)      Activities of Daily Living Home Assistive Devices/Equipment: CBG Meter, Eyeglasses ADL Screening (condition at time of admission) Patient's cognitive ability adequate to safely complete daily activities?: Yes Is the patient deaf or have difficulty hearing?: No Does the patient have difficulty seeing, even when wearing glasses/contacts?: No Does the patient have difficulty concentrating, remembering, or making decisions?: No Patient able to express need for assistance with ADLs?: Yes Does the patient have difficulty dressing or bathing?: No Independently performs ADLs?: Yes (appropriate for developmental age) Does the  patient have difficulty walking or climbing stairs?: Yes (secondary to weakness) Weakness of Legs: Both (bilateral metatarsal amputations) Weakness of Arms/Hands: None  Permission Sought/Granted Permission sought to share information with : Case Manager                Emotional Assessment Appearance:: Appears stated age     Orientation: : Oriented to Self, Oriented to Place, Oriented to  Time, Oriented to Situation      Admission diagnosis:  AKI (acute kidney injury) (HCC) [N17.9] Symptomatic anemia [D64.9] Acute congestive heart failure, unspecified heart failure type [facility]) [I50.9] Patient Active Problem List   Diagnosis Date Noted   Chronic osteomyelitis of right foot with draining sinus (HCC)    Acute on chronic heart failure with preserved ejection fraction (HCC)    Elevated troponin    Symptomatic anemia 02/01/2021   Open wound of right foot 01/28/2017   History of osteomyelitis 08/23/2016   SIRS (systemic inflammatory response syndrome) (HCC) 08/22/2016   HTN (hypertension) 07/22/2016   Chronic pain syndrome 01/17/2016   Pain in joint, shoulder region 06/29/2015   Hyperlipidemia 10/17/2014   Coronary artery disease 06/17/2014   Lung nodule-CT 3/15 06/17/2014   Hepatomegaly-steatosis 08/08/2013   DM neuropathy, 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 [facility] - [place], [place] - [address] [place] [address] Phone: [phone] Fax: [phone]     Social Determinants of Health (SDOH) Interventions    Readmission Risk Interventions No flowsheet data found.

## 2021-02-07 NOTE — Progress Notes (Signed)
Patient ID: [name] Snow, male   DOB: March 23, 1966, 55 y.o.   MRN: [id number] I called the patient to review the proposed surgery for a right below the knee amputation tomorrow.  Patient states he was unable to contact family, he states he would like to proceed with surgery next week.  I will reevaluate on Monday for surgical planning.

## 2021-02-07 NOTE — Progress Notes (Signed)
Subjective:  Breathing stable  Has osetomyelitis Rt foot, will need amputation  Output not accurately documented  Objective:  Vital Signs in the last 24 hours: Temp:  [97.6 F (36.4 C)-98.3 F (36.8 C)] 98.3 F (36.8 C) (09/22 0853) Pulse Rate:  [63-73] 73 (09/22 0853) Resp:  [17-18] 18 (09/22 0853) BP: (135-169)/(69-79) 169/77 (09/22 0853) SpO2:  [96 %-99 %] 97 % (09/22 0853)  Intake/Output from previous day: 09/21 0701 - 09/22 0700 In: 118 [P.O.:118] Out: -   Physical Exam Vitals and nursing note reviewed.  Constitutional:      General: He is not in acute distress.    Appearance: He is well-developed.  HENT:     Head: Normocephalic and atraumatic.  Eyes:     Conjunctiva/sclera: Conjunctivae normal.     Pupils: Pupils are equal, round, and reactive to light.  Neck:     Vascular: No JVD.  Cardiovascular:     Rate and Rhythm: Normal rate and regular rhythm.     Pulses: Normal pulses and intact distal pulses.     Heart sounds: No murmur heard. Pulmonary:     Effort: Pulmonary effort is normal.     Breath sounds: Decreased air movement present. Examination of the right-lower field reveals decreased breath sounds. Examination of the left-lower field reveals decreased breath sounds. Decreased breath sounds present. No wheezing or rales.  Abdominal:     General: Bowel sounds are normal.     Palpations: Abdomen is soft.     Tenderness: There is no rebound.  Musculoskeletal:        General: No tenderness. Normal range of motion.     Right lower leg: Edema present.     Left lower leg: Edema present.     Comments: B/l transmetatarsal amputation Rt sided amputation wound, well healing  Lymphadenopathy:     Cervical: No cervical adenopathy.  Skin:    General: Skin is warm and dry.  Neurological:     Mental Status: He is alert and oriented to person, place, and time.     Cranial Nerves: No cranial nerve deficit.     Lab Results: BMP Recent Labs    02/05/21 0352  02/06/21 0339 02/07/21 0646  NA 138 138 137  K 4.5 4.3 3.8  CL 104 101 98  CO2 24 25 29   GLUCOSE 133* 141* 148*  BUN 105* 111* 99*  CREATININE 1.99* 2.18* 2.30*  CALCIUM 8.1* 8.4* 8.0*  GFRNONAA 38* 34* 32*     CBC Recent Labs  Lab 02/01/21 0646 02/01/21 1913 02/07/21 0646  WBC 10.4   < > 12.3*  RBC 2.93*   < > 2.91*  HGB 8.5*   < > 8.4*  HCT 27.3*   < > 25.9*  PLT 262   < > 225  MCV 93.2   < > 89.0  MCH 29.0   < > 28.9  MCHC 31.1   < > 32.4  RDW 14.8   < > 16.8*  LYMPHSABS 1.8  --   --   MONOABS 0.8  --   --   EOSABS 0.3  --   --   BASOSABS 0.0  --   --    < > = values in this interval not displayed.     HEMOGLOBIN A1C Lab Results  Component Value Date   HGBA1C 6.3 (H) 02/01/2021   MPG 134.11 02/01/2021    Cardiac Panel (last 3 results) No results for input(s): CKTOTAL, CKMB, TROPONINI, RELINDX in the last  8760 hours.  BNP (last 3 results) Recent Labs    02/01/21 0646  BNP 1,671.1*     TSH No results for input(s): TSH in the last 8760 hours.  Lipid Panel     Component Value Date/Time   CHOL 219 (H) 09/29/2017 1629   TRIG 117 09/29/2017 1629   HDL 41 09/29/2017 1629   CHOLHDL 5.3 (H) 09/29/2017 1629   CHOLHDL 4.3 12/14/2015 1306   VLDL 35 (H) 12/14/2015 1306   LDLCALC 155 (H) 09/29/2017 1629   LDLDIRECT (H) 09/02/2007 1330    127 (NOTE) ATP III Classification (LDL):      < 100        mg/dL         Optimal     335 - 129     mg/dL         Near or Above Optimal     130 - 159     mg/dL         Borderline High     160 - 189     mg/dL         High      > 456        mg/dL         Very  High      Hepatic Function Panel Recent Labs    02/03/21 0553 02/04/21 0315 02/05/21 0352  PROT 7.4 7.5 6.7  ALBUMIN 2.3* 2.5* 2.3*  AST 14* 19 16  ALT 6 6 8   ALKPHOS 65 60 66  BILITOT 0.6 1.4* 1.4*     CXR 02/03/2021: Increase in bibasilar airspace disease representing edema versus multifocal pneumonia.   Ultrasound of the abdomen  02/01/2021: Increase in bibasilar airspace disease representing edema versus multifocal pneumonia.    EKG 02/04/2021:  Normal sinus rhythm at rate of 79 bpm, right axis deviation, cannot exclude high lateral infarct old.  Consider lead misplacement.  Lead I and aVR.  Compared to 02/01/2021, lead reversal is not present otherwise normal sinus rhythm, old inferior infarct and poor R wave progression, cannot exclude old anterolateral infarct.    Echocardiogram 02/02/2021:  1. Left ventricular ejection fraction, by estimation, is 50 to 55%. The left ventricle has low normal function. The left ventricle demonstrates regional wall motion abnormalities (see scoring diagram/findings for description). There is mild left  ventricular hypertrophy. Left ventricular diastolic parameters were normal.  2. Right ventricular systolic function is normal. The right ventricular size is normal. Tricuspid regurgitation signal is inadequate for assessing PA pressure.  3. Left atrial size was severely dilated.  4. Right atrial size was mildly dilated.  5. The mitral valve is grossly normal. Mild mitral valve regurgitation.  6. The aortic valve is tricuspid. Aortic valve regurgitation is not visualized. No aortic stenosis ipresent. Aortic valve mean gradient measures 4.0 mmHg.  7. The inferior vena cava is dilated in size with >50% respiratory variability, suggesting right atrial pressure of 8 mmHg.  Left Heart Catheterization 08/22/2020: ANGIOGRAM/CORONARY ARTERIOGRAM:    There was acute total occlusion in the mid right coronary artery.  There was  severe disease in the mid left anterior descending.  There was  severe disease in the mid   circumflex.  There was  moderate disease in the ostial posterior descending artery.   LEFT VENTRICULOGRAM:  Left ventriculography showed a segmental abnormality in the inferior wall  with an estimated EF of  40-45%   PCI:    A 3.0 mm, X 23 mm Xience  Skypoint DES was placed in  the Mid right coronary  artery, with subsequent dilatation up to 3.5 mm  A 3.5 mm by 23 mm Xience Skypoint DES was placed in the Proximal right  coronary artery, where there appeared to be some dissection perhaps from  the guide catheter at the site of a moderate stenosis.  A PTCA with a 2.5 mm, X 15 mm balloon was performed in the ostial and  proximal posterior descending artery for probable thrombus embolization  from the STEMI lesion.     Assessment & Recommendations:  55 year old Caucasian male with uncontrolled hypertension, type 2 diabetes mellitus, CAD (recent primary PCI for RCA 08/2020, Lcx + LAD and circumflex disease awaiting CABG, preferred due to concern for medication noncompliance), diabetic foot ulcer s/p b/l metatarsal amputation, recent AKI with underlying CKD stage III, now admitted with acute on chronic HFpEF, troponin elevation  Acute on chronic HFpEF: Diuresing, but amount not documented. Continue as per nephrology recommendations.  Cr going up, could holding lasix for a day.  CAD: Mild troponin elevation, likely type 2 MI than ACS. Continue Aspirin 81 mg, rosuvastatin 20 mg.  Continue metoprolol succinate 25 mg daily.  Changing Imdur 30 mg to Bidil, as below. Residual Lcx and LAD disease. In long term, CABG will still be a better option given compliance concerns. Recommend re-establishing with Dr. Ty Hilts and cardiologist in [facility] system.  Pre-op risk stratification: Patient is going to undergo right below-knee amputation for osteomyelitis. He has unrevascularized CAD, and HFpEF.  Unfortunately, there is no acute cardiac intervention that would mitigate his perioperative cardiac risk.  Continue aspirin, statin, metoprolol for medical medication perioperative risk.  Hypertension: Better uncontrolled. Unable to use ACE/ARNI/ARB/AA given increasing creatinine.  We will peripherally follow the patient.  Please call us if you have any questions.   Elder Negus, MD Pager: [phone] Office: [phone]

## 2021-02-07 NOTE — Plan of Care (Signed)

## 2021-02-07 NOTE — Consult Note (Signed)
ORTHOPAEDIC CONSULTATION  REQUESTING PHYSICIAN: Zigmund Daniel., *  Chief Complaint: Osteomyelitis right transmetatarsal amputation.  HPI: [name] Snow is a 55 y.o. male who presents with multiple medical problems including diabetes peripheral vascular disease coronary artery disease and GI bleeding.  Patient states he is most recently been hospitalized for 3 weeks in [place] and now hospitalized 1 week in [place] for a month of hospitalization.  Patient has persistent cellulitis of the right transmetatarsal amputation he has undergone previous debridements without resolution of the underlying infection.  Past Medical History:  Diagnosis Date   Arthritis    gout   Cataract    CHF (congestive heart failure) (HCC)    patient denies   Chronic kidney disease    Clotting disorder (HCC)    patient denies   Diabetes mellitus    Gout    History of kidney stones    stents placed prior to and removed   Hypertension    Past Surgical History:  Procedure Laterality Date   AMPUTATION Right 08/24/2016   Procedure: IRRIGATION AND DEBRIDEMENT OF RIGHT FOOT AND AMPUTATION OF RIGHT FIFTH RAY;  Surgeon: Tarry Kos, MD;  Location: [facility];  Service: Orthopedics;  Laterality: Right;   I & D EXTREMITY Right 08/27/2016   Procedure: IRRIGATION AND DEBRIDEMENT RIGHT FOOT, VAC,  4TH RAY AMPUTATION;  Surgeon: Tarry Kos, MD;  Location: [facility];  Service: Orthopedics;  Laterality: Right;   I & D EXTREMITY Right 08/29/2016   Procedure: IRRIGATION AND DEBRIDEMENT EXTREMITY WITH ACELL APPLICATION;  Surgeon: Tarry Kos, MD;  Location: [facility] OR;  Service: Orthopedics;  Laterality: Right;   I & D EXTREMITY Right 09/04/2016   Procedure: IRRIGATION AND DEBRIDEMENT RIGHT FOOT, VAC SPONGE REPLACEMENT;  Surgeon: Tarry Kos, MD;  Location: [facility] OR;  Service: Orthopedics;  Laterality: Right;   JOINT REPLACEMENT  [id number]   R Hip   kidney stents     Social History   Socioeconomic History   Marital  status: Married    Spouse name: Not on file   Number of children: Not on file   Years of education: Not on file   Highest education level: Not on file  Occupational History   Not on file  Tobacco Use   Smoking status: Never   Smokeless tobacco: Never  Substance and Sexual Activity   Alcohol use: No   Drug use: No   Sexual activity: Never    Birth control/protection: None  Other Topics Concern   Not on file  Social History Narrative   Not on file   Social Determinants of Health   Financial Resource Strain: Not on file  Food Insecurity: Not on file  Transportation Needs: Not on file  Physical Activity: Not on file  Stress: Not on file  Social Connections: Not on file   Family History  Problem Relation Age of Onset   Mental illness Mother    Hypertension Mother    Hyperlipidemia Mother    Mental illness Father    COPD Father    Mental illness Brother    - negative except otherwise stated in the family history section Allergies  Allergen Reactions   No Known Allergies    Prior to Admission medications   Medication Sig Start Date End Date Taking? Authorizing Provider  aspirin 81 MG EC tablet Take 81 mg by mouth daily. 08/26/20  Yes [provider]  clotrimazole-betamethasone (LOTRISONE) cream Apply 1 application topically 2 (two) times daily. Patient  taking differently: Apply 1 application topically 2 (two) times daily as needed (dry skin). 09/29/17  Yes Weber, Sarah L, PA-C  Dulaglutide (TRULICITY) 1.5 VV/6.1YW SOPN Inject 1.5 Units into the skin once a week. 01/01/18  Yes Weber, Sarah L, PA-C  glipiZIDE (GLUCOTROL) 10 MG tablet Take 10 mg by mouth daily as needed (if glucose above 180). 05/28/20  Yes [provider]  HYDROcodone-acetaminophen (NORCO) 10-325 MG tablet Take 1 tablet by mouth every 4 (four) hours as needed. 03/03/18  Yes Weber, Sarah L, PA-C  rosuvastatin (CRESTOR) 20 MG tablet Take 1 tablet (20 mg total) by mouth every evening. 01/01/18  Yes  Weber, Sarah L, PA-C  tiZANidine (ZANAFLEX) 4 MG tablet Take 1 tablet (4 mg total) by mouth every 6 (six) hours as needed for muscle spasms. 02/03/18  Yes Weber, Sarah L, PA-C  blood glucose meter kit and supplies KIT Dispense based on insurance preference. Use bid as directed. (FOR ICD-9 250.00) 09/29/17   Weber, Damaris Hippo, PA-C  Continuous Blood Gluc Sensor (FREESTYLE LIBRE 14 DAY SENSOR) MISC 1 Device by Does not apply route every 14 (fourteen) days. 11/26/17   Weber, Damaris Hippo, PA-C  febuxostat (ULORIC) 40 MG tablet Take 1 tablet (40 mg total) by mouth daily. Pt failed allopurinol Patient not taking: Reported on 02/01/2021 02/03/18   Gale Journey, Damaris Hippo, PA-C  gabapentin (NEURONTIN) 400 MG capsule 1 po qam, 2 po q afternon and 2 po qhs Patient not taking: Reported on 02/01/2021 01/01/18   Mancel Bale, PA-C  glucose blood test strip Use as instructed 11/04/16   Weber, Damaris Hippo, PA-C  HYDROcodone-acetaminophen (NORCO) 10-325 MG tablet Take 1 tablet by mouth every 4 (four) hours as needed. Patient not taking: Reported on 02/01/2021 02/01/18   Gale Journey, Damaris Hippo, PA-C  HYDROcodone-acetaminophen [facility]) 10-325 MG tablet Take 1 tablet by mouth every 4 (four) hours as needed. Patient not taking: Reported on 02/01/2021 04/03/18   Gale Journey, Damaris Hippo, PA-C  Insulin Glargine (LANTUS SOLOSTAR) 100 UNIT/ML Solostar Pen Inject 36 Units into the skin 2 (two) times daily. Patient not taking: Reported on 02/01/2021 01/01/18   Mancel Bale, PA-C  Lancets MISC 1 Device by Does not apply route 3 (three) times daily. E11.40 11/04/16   Weber, Damaris Hippo, PA-C  lisinopril (PRINIVIL,ZESTRIL) 10 MG tablet Take 0.5 tablets (5 mg total) by mouth daily. Patient not taking: Reported on 02/01/2021 01/01/18   Mancel Bale, PA-C   MR FOOT RIGHT WO CONTRAST  Result Date: 02/06/2021 CLINICAL DATA:  Diabetic with foot pain and swelling. History of prior mid metatarsal amputations. EXAM: MRI OF THE RIGHT FOREFOOT WITHOUT CONTRAST TECHNIQUE: Multiplanar,  multisequence MR imaging of the right foot was performed. No intravenous contrast was administered. COMPARISON:  Prior MRI from 2018 FINDINGS: Mid metatarsal amputations are noted. There is diffuse abnormal T1 and T2 signal intensity in the first, second and third metatarsals highly suspicious for osteomyelitis. The fourth and fifth metatarsals are unremarkable. Diffuse subcutaneous soft tissue swelling/edema/fluid suggesting cellulitis. No discrete fluid collection to suggest a drainable soft tissue abscess. Diffuse myofasciitis without definite findings for pyomyositis. Moderate midfoot (tarsal metatarsal) degenerative changes but no findings to suggest septic arthritis. The tibiotalar and subtalar joints are maintained. No findings for hindfoot septic arthritis or osteomyelitis. Slight thickening and increased T2 signal intensity in the plantar fascia near its attachment site suggesting mild-to-moderate plantar fasciitis. The major ankle tendons and ligaments are grossly intact. IMPRESSION: 1. Findings highly suspicious for osteomyelitis involving the first,  second and third metatarsals. 2. Diffuse cellulitis and myofasciitis without definite findings for subcutaneous abscess or pyomyositis. 3. No findings for septic arthritis or osteomyelitis involving the midfoot or hindfoot. 4. Plantar fasciitis. Electronically Signed   By: Marijo Sanes M.D.   On: 02/06/2021 13:41   DG CHEST PORT 1 VIEW  Result Date: 02/05/2021 CLINICAL DATA:  Follow-up pulmonary edema EXAM: PORTABLE CHEST 1 VIEW COMPARISON:  02/03/2021 FINDINGS: Check shadow is stable. Significant improvement in vascular congestion and pulmonary edema is noted bilaterally. Small effusions are again seen as well as some mild bibasilar atelectatic changes. No bony abnormality is noted. IMPRESSION: Significant improvement of previously seen vascular congestion and edema. Electronically Signed   By: Inez Catalina M.D.   On: 02/05/2021 15:50   - pertinent  xrays, CT, MRI studies were reviewed and independently interpreted  Positive ROS: All other systems have been reviewed and were otherwise negative with the exception of those mentioned in the HPI and as above.  Physical Exam: General: Alert, no acute distress Psychiatric: Patient is competent for consent with normal mood and affect Lymphatic: No axillary or cervical lymphadenopathy Cardiovascular: No pedal edema Respiratory: No cyanosis, no use of accessory musculature GI: No organomegaly, abdomen is soft and non-tender    Images:  $Remove'@ENCIMAGES'[id number]$ @  Labs:  Lab Results  Component Value Date   HGBA1C 6.3 (H) 02/01/2021   HGBA1C 7.7 (H) 01/01/2018   HGBA1C 7.2 (H) 09/29/2017   ESRSEDRATE 132 (H) 08/22/2016   CRP 21.0 (H) 08/22/2016   LABURIC 6.2 01/28/2018   LABURIC 9.5 (H) 01/01/2018   LABURIC 7.5 11/26/2017   REPTSTATUS 08/28/2016 FINAL 08/22/2016   GRAMSTAIN Rare 12/14/2015   GRAMSTAIN WBC present-predominately PMN 12/14/2015   GRAMSTAIN Few Squamous Epithelial Cells Present 12/14/2015   GRAMSTAIN Abundant GRAM POSITIVE COCCI IN CLUSTERS In Chains 12/14/2015   CULT  08/22/2016    NO GROWTH 5 DAYS Performed at [facility], [place] [address] [place], [place] [address]    LABORGA STREPTOCOCCUS GROUP G 12/14/2015   LABORGA Abundant STAPHYLOCOCCUS AUREUS 12/14/2015    Lab Results  Component Value Date   ALBUMIN 2.3 (L) 02/05/2021   ALBUMIN 2.5 (L) 02/04/2021   ALBUMIN 2.3 (L) 02/03/2021   PREALBUMIN 10.4 (L) 08/22/2016   LABURIC 6.2 01/28/2018   LABURIC 9.5 (H) 01/01/2018   LABURIC 7.5 11/26/2017     CBC EXTENDED Latest Ref Rng & Units 02/06/2021 02/05/2021 02/04/2021  WBC 4.0 - 10.5 K/uL 11.4(H) 9.1 11.0(H)  RBC 4.22 - 5.81 MIL/uL 3.26(L) 2.76(L) 2.99(L)  HGB 13.0 - 17.0 g/dL 9.4(L) 7.9(L) 8.5(L)  HCT 39.0 - 52.0 % 29.1(L) 24.8(L) 26.5(L)  PLT 150 - 400 K/uL 233 198 205  NEUTROABS 1.7 - 7.7 K/uL - - -  LYMPHSABS 0.7 - 4.0 K/uL - - -    Neurologic: Patient  does not have protective sensation bilateral lower extremities.   MUSCULOSKELETAL:   Skin: Examination patient has cellulitis with a nonhealing ulcer across the right transmetatarsal amputation.  Patient has a good dorsalis pedis pulse.  Review of the MRI scan shows extensive osteomyelitis involving the first metatarsal and infection distal to the second metatarsal.  Semination the left foot he does have some Wagner grade 1 ulcers on the plantar aspect left foot but no ascending cellulitis.  Patient has a hemoglobin of 9.4 white cell count of 11.4.  Albumin 2.3 with most recent hemoglobin A1c 6.3.  Assessment: Assessment: Diabetic peripheral vascular disease with coronary artery disease recent GI bleed with  osteomyelitis right transmetatarsal amputation.  Plan: Discussed with the patient we need to treat the chronic infection in the right foot in order for patient to proceed with his coronary artery bypass surgery.  Have recommended proceeding with a right below-knee amputation tomorrow.  Patient states that he would like to wait and get his things in order prior to surgery.  I discussed that there is no benefit in postponing treatment for the chronic osteomyelitis in the right foot.  I will call him back around noon to have a further discussion.  Thank you for the consult and the opportunity to see [name], [place] [phone] 7:25 AM

## 2021-02-07 NOTE — Consult Note (Signed)
[facility]    Date of Admission:  02/01/2021     Reason for Consult: Osteomyelitis     Referring Physician: Dr Lowell Guitar  Current antibiotics: Daptomycin 9/22 -- present  ASSESSMENT:    55 y.o. male admitted with:  Right foot osteomyelitis complicated by MRSA bacteremia at OSH: Records unavailable from outside hospital to determine what work-up he had.  However, noted to have blood culture clearance.  We will try to obtain further records to see what he had done but he likely received approximately 2.5 weeks of antibiotics for this infection. Diabetes: A1c 6.3. AKI/CKD: Nephrology following.  Presumed now has CKD and receiving high-dose Lasix.  Creatinine today is 2.3. Acute on chronic HFpEF: Cardiology following. Coronary artery disease Anemia: Hemoglobin stable.  Status post GI bleed during recent hospitalization in [place].  RECOMMENDATIONS:    Start daptomycin given MRSA bacteremia at outside hospital Blood cultures x2 Follow-up surgical plans.  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 aspirin EC  81 mg Oral Daily  . insulin aspart  0-15 Units Subcutaneous TID WC  . insulin aspart  0-5 Units Subcutaneous QHS  . insulin glargine-yfgn  5 Units Subcutaneous Daily  . isosorbide-hydrALAZINE  1 tablet Oral TID  . metolazone  10 mg Oral Daily  . metoprolol succinate  50 mg Oral Daily  . pantoprazole  40 mg Oral BID  . rosuvastatin  20 mg Oral QPM   Continuous Infusions: . DAPTOmycin (CUBICIN)  IV    . furosemide 120 mg (02/07/21 1143)   PRN  Meds:.acetaminophen **OR** acetaminophen, hydrALAZINE, HYDROcodone-acetaminophen, prochlorperazine  HPI:    [name] Snow is a 56 y.o. male with a past medical history of CAD, CKD, obesity, hypertension, hyperlipidemia, diabetes who presented to [facility] 9/16 with weakness, shortness of breath, lower extremity swelling, and difficulty lying flat.  He was diagnosed with acute on chronic CHF exacerbation and was effectively diuresed.  He had a recent prolonged admission at hospital in [place], [place] (8/24-9/11; left AMA) where he presented with infected right foot wound.  He had partial ray amputations in early July that did not heal appropriately and subsequently had to undergo transmetatarsal amputation during this hospitalization.  He was given antibiotics in the setting of infection which according to him led to renal failure.  That hospital course was also complicated by GI bleed.  He ended up leaving that admission AMA without antibiotics at DC and returned to [place] where he has previously been in care.  He was unable to elaborate further on the antibiotic plan or work-up during his previous admission, however, previous notes indicate that he was seen by cardiology where he had an echo that was reportedly benign.  TTE this admission did not note any valvular lesions.  According to the microbiology lab at the outside hospital, patient had positive blood cultures for MRSA on 8/24 with subsequent clearance.  He left that admission on 9/11.  He was able to get an MRI of his foot after successful diuresis yesterday which  showed osteomyelitis involving the first, second, and third metatarsals with diffuse cellulitis and mild fasciitis.  There was no definite findings for abscess or pyomyositis.  There was no septic arthritis.  He was seen by orthopedic surgery (Dr. [name]) earlier today who recommended transtibial amputation.   Past Medical History:  Diagnosis Date  . Arthritis     gout  . Cataract   . CHF (congestive heart failure) (HCC)    patient denies  . Chronic kidney disease   . Clotting disorder [facility])    patient denies  . Diabetes mellitus   . Gout   . History of kidney stones    stents placed prior to and removed  . Hypertension     Social History   Tobacco Use  . Smoking status: Never  . Smokeless tobacco: Never  Substance Use Topics  . Alcohol use: No  . Drug use: No    Family History  Problem Relation Age of Onset  . Mental illness Mother   . Hypertension Mother   . Hyperlipidemia Mother   . Mental illness Father   . COPD Father   . Mental illness Brother     Allergies  Allergen Reactions  . No Known Allergies     Review of Systems  Constitutional: Negative.   Respiratory: Negative.    Cardiovascular: Negative.   Gastrointestinal: Negative.   Genitourinary: Negative.   Musculoskeletal:  Positive for joint pain.  Skin:        + right foot wound.  All other systems reviewed and are negative.  OBJECTIVE:   Blood pressure 121/72, pulse 61, temperature 97.6 F (36.4 C), temperature source Oral, resp. rate 15, height 5\' 11"  (1.803 m), weight 112.4 kg, SpO2 98 %. Body mass index is 34.56 kg/m.  Physical Exam Constitutional:      General: He is not in acute distress.    Appearance: Normal appearance.  HENT:     Head: Normocephalic and atraumatic.  Eyes:     Extraocular Movements: Extraocular movements intact.     Conjunctiva/sclera: Conjunctivae normal.  Pulmonary:     Effort: Pulmonary effort is normal. No respiratory distress.  Abdominal:     General: There is no distension.     Palpations: Abdomen is soft.     Tenderness: There is no abdominal tenderness.  Musculoskeletal:        General: Swelling present.  Skin:    General: Skin is warm and dry.     Findings: Erythema present.     Comments: S/p right TMA with erythema surrounding incision.  No active purulent drainage.   Neurological:     General: No focal  deficit present.     Mental Status: He is alert and oriented to person, place, and time.  Psychiatric:        Mood and Affect: Mood normal.        Behavior: Behavior normal.     Lab Results: Lab Results  Component Value Date   WBC 12.3 (H) 02/07/2021   HGB 8.4 (L) 02/07/2021   HCT 25.9 (L) 02/07/2021   MCV 89.0 02/07/2021   PLT 225 02/07/2021    Lab Results  Component Value Date   NA 137 02/07/2021   K 3.8 02/07/2021   CO2 29 02/07/2021   GLUCOSE 148 (H) 02/07/2021   BUN 99 (H) 02/07/2021   CREATININE 2.30 (H) 02/07/2021   CALCIUM 8.0 (L) 02/07/2021   GFRNONAA 32 (L) 02/07/2021   GFRAA 76 01/01/2018    Lab  Results  Component Value Date   ALT 8 02/05/2021   AST 16 02/05/2021   ALKPHOS 66 02/05/2021   BILITOT 1.4 (H) 02/05/2021       Component Value Date/Time   CRP 21.0 (H) 08/22/2016 2235       Component Value Date/Time   ESRSEDRATE 132 (H) 08/22/2016 2235    I have reviewed the micro and lab results in Epic.  Imaging: MR FOOT RIGHT WO CONTRAST  Result Date: 02/06/2021 CLINICAL DATA:  Diabetic with foot pain and swelling. History of prior mid metatarsal amputations. EXAM: MRI OF THE RIGHT FOREFOOT WITHOUT CONTRAST TECHNIQUE: Multiplanar, multisequence MR imaging of the right foot was performed. No intravenous contrast was administered. COMPARISON:  Prior MRI from 2018 FINDINGS: Mid metatarsal amputations are noted. There is diffuse abnormal T1 and T2 signal intensity in the first, second and third metatarsals highly suspicious for osteomyelitis. The fourth and fifth metatarsals are unremarkable. Diffuse subcutaneous soft tissue swelling/edema/fluid suggesting cellulitis. No discrete fluid collection to suggest a drainable soft tissue abscess. Diffuse myofasciitis without definite findings for pyomyositis. Moderate midfoot (tarsal metatarsal) degenerative changes but no findings to suggest septic arthritis. The tibiotalar and subtalar joints are maintained. No  findings for hindfoot septic arthritis or osteomyelitis. Slight thickening and increased T2 signal intensity in the plantar fascia near its attachment site suggesting mild-to-moderate plantar fasciitis. The major ankle tendons and ligaments are grossly intact. IMPRESSION: 1. Findings highly suspicious for osteomyelitis involving the first, second and third metatarsals. 2. Diffuse cellulitis and myofasciitis without definite findings for subcutaneous abscess or pyomyositis. 3. No findings for septic arthritis or osteomyelitis involving the midfoot or hindfoot. 4. Plantar fasciitis. Electronically Signed   By: Rudie Meyer M.D.   On: 02/06/2021 13:41   DG CHEST PORT 1 VIEW  Result Date: 02/05/2021 CLINICAL DATA:  Follow-up pulmonary edema EXAM: PORTABLE CHEST 1 VIEW COMPARISON:  02/03/2021 FINDINGS: Check shadow is stable. Significant improvement in vascular congestion and pulmonary edema is noted bilaterally. Small effusions are again seen as well as some mild bibasilar atelectatic changes. No bony abnormality is noted. IMPRESSION: Significant improvement of previously seen vascular congestion and edema. Electronically Signed   By: Alcide Clever M.D.   On: 02/05/2021 15:50     Imaging independently reviewed in Epic.  Vedia Coffer for Infectious Disease [facility] [phone] pager 02/07/2021, 12:39 PM

## 2021-02-07 NOTE — Progress Notes (Signed)
[facility] Progress Note  Subjective: breathing continues to improve, lying down now at night. Stood up at 244 lbs today.   Vitals:   02/06/21 2015 02/06/21 2234 02/07/21 0853 02/07/21 1211  BP: 135/69 (!) 142/79 (!) 169/77 121/72  Pulse: 63 70 73 61  Resp: $Remo'17 18 18 15  'JZTzA$ Temp: 98 F (36.7 C)  98.3 F (36.8 C) 97.6 F (36.4 C)  TempSrc: Oral  Oral Oral  SpO2: 99% 96% 97% 98%  Weight:      Height:        Exam: Gen alert, not in distress No jvd or bruits Chest clear bilat RRR no MRG Abd soft ntnd no mass or ascites +bs Ext 2+ pitting pretib edema bilat  Bilat TMA wounds are dressed  Neuro is alert, Ox 3 , nf      Date                           Creat               eGFR   2009- 2015                0.90- 1.21   2016- 2017                1.02- 1.48   2018                          1.15- 1.64   2019                          1.12- 1.30        62- 71   Sept 16, 2022           1.97                 39   Sept 17                      1.94                 40    Sept 18                     2.05                 37      Home meds include asa, trulicity, glucotrol, norco prn, crestor, zanaflex prn, uloric, neurontin, lantus insulin 36 bid, lisiniopril $RemoveBeforeDE'5mg'OooDNYDpwyGjYnj$  qd     Renal US - 12.6 / 11.9 cm kidneys. Mild L hydro, bladder not distended, bilat jets not seen.     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 Creat on admit here was 1.9, three yrs later in setting of vol overload w/ pulm edema/ resp distress. Renal US w/ mild L hydro, nothing on R. Hx of multiple kidney stones over the years. UA +proteinuria, urine lytes c/w CKD.  Suspect this is CKD now, possibly cardiorenal. Started high-dose IV lasix 9/18. Responding well, resp issues resolved now, has sig residual LE edema. Will continue IV lasix 120 tid for another 24-48hrs  as long as B/Cr/ BP's remain stable.  CAD - recently dx'd by [facility]. Has cardiologist and PCP at [facility]. Cardiology consulting here.  PAD / osteo R foot - on IV daptomycin. Dr Sharol Given consulting H/o kidney stones - has had stents placed / removed in the past  HTN - holding lisinopril while diuresing, getting toprol xl 50 qd  IDDM - per pmd Volume - vol overload as above Anemia - Hb 7- 8 range, sp massive GI bleed during recent hosp stay in [place] 02/07/2021, 12:12 PM   Recent Labs  Lab 02/06/21 0339 02/07/21 0646  K 4.3 3.8  BUN 111* 99*  CREATININE 2.18* 2.30*  CALCIUM 8.4* 8.0*  HGB 9.4* 8.4*    Inpatient medications:  aspirin EC  81 mg Oral Daily   insulin aspart  0-15 Units Subcutaneous TID WC   insulin aspart  0-5 Units Subcutaneous QHS   insulin glargine-yfgn  5 Units Subcutaneous Daily   isosorbide-hydrALAZINE  1 tablet Oral TID   metolazone  10 mg Oral Daily   metoprolol succinate  50 mg Oral Daily   pantoprazole  40 mg Oral BID   rosuvastatin  20 mg Oral QPM    DAPTOmycin (CUBICIN)  IV     furosemide 120 mg (02/07/21 1143)   acetaminophen **OR** acetaminophen, hydrALAZINE, HYDROcodone-acetaminophen, prochlorperazine

## 2021-02-07 NOTE — Progress Notes (Signed)
PROGRESS NOTE    [name] Snow  QMV:[id number] DOB: [birth year] DOA: 02/01/2021 PCP: Patient, No Pcp Per (Inactive)   Chief Complaint  Patient presents with   Abnormal Lab   Brief Narrative:  55 year old male with history of CAD, chronic kidney disease stage IIIa with baseline creatinine 1.2-1.3, obesity, hypertension, hyperlipidemia, comes into the hospital with complaints of weakness, shortness of breath, lower extremity swelling and difficulty laying flat.  Patient had [name] Snow recent prolonged hospitalization for about 19 days in [place].  Apparently presented there for infected right foot and he is now status post metatarsal amputation.  He was given antibiotics in the setting of infection which led to renal failure and he required HD x1.  He tells me his creatinine was as high as 6.5 when he was hospitalized there and believes that it was around 4 when he left but he is not sure.  That hospital course was also complicated by GI bleed with bright red blood per rectum/melena for which he underwent [name] Snow GI evaluation with EGD, colonoscopy as well as [name] Snow capsule study and apparently without [name] Snow clear-cut source of his bleed.  He required Kenady Doxtater total of 6 units of packed red blood cells.  He was telling me he was supposed to leave with IV antibiotics and PICC line but did and does not want antibiotics given the fact that they caused his renal failure to begin with.  He is also had [name] Snow recent STEMI in April, was hospitalized at [facility] and underwent stenting of the proximal and mid RCA with PTCA to the ostial and proximal PDA.  EF was 40-45%.  He also was found to have severe three-vessel disease with severe stenosis of the OM and LAD/D1 and currently is in the process of getting bypass surgery but it was delayed due to his hospitalization   With cardiology and nephrology consultations, the patient has been effectively diuresing with improvement in orthopnea which has allowed MRI of the right foot on 9/21.  This showed changes consistent with osteomyelitis of the 1st, 2nd, 3rd metatarsals for which orthopedics has been consulted.   Assessment & Plan:   Principal Problem:   MRSA bacteremia Active Problems:   Coronary artery disease   Open wound of right foot   Symptomatic anemia   Acute on chronic heart failure with preserved ejection fraction (HCC)   Elevated troponin   Chronic osteomyelitis of right foot with draining sinus (HCC)  Osteomyelitis of right foot metatarsals MRSA Bacteremia MRI shows findings concerning for osteomyelitis at the first, second, and third metatarsals.  Diffuse cellulitis and myofasciitis without definitive findings for abscess or pyomyositis.   MRSA Bacteremia diagnosed at outside hospital per infectious disease - will try to obtain outside records (looks like he was at [facility] in [place], discharged on 8/24) Dr. [name] following, will be back to see him Monday ID following, appreciate recommendations - abx per ID Blood cultures here pending  Acute on chronic combined CHF due to acute pulmonary edema in the setting of acute on chronic diastolic CHF patient admitted to the hospital for elevated BNP, fluid overload -2D echo done 9/17 showed EF 50-55%, low normal, as well as regional WMA.  It also showed mild LVH, normal RV. -diuresis per renal  -Strict ins and outs, daily weights    Coronary artery disease, severe three-vessel disease, troponin elevation, NSTEMI type II -patient established care with Dr. Ty Hilts at [facility] for CABG in the near future. -troponin increased to 1283 -  cardiology was consulted -appreciated Dr. Rosemary Holms recommendations - suspects demand ischemia rather than ACS -Added metoprolol, bidil.  He was started on aspirin .  Continue crestor.     Acute kidney injury on chronic kidney disease stage IIIa  -Patient's creatinine on admission was 1.9 from baseline about 1.2-1.4.  He was recently hospitalized earlier this  month requiring HD.  Patient recalls creatinine around 4 from his discharge but up to 6 as [name] Snow peak -creatinine up to 2.3 today -diuresis per renal, appreciate assistance   Hyperkalemia -Resolved, continue to monitor   Normocytic anemia, recent GI bleed-fecal occult is positive however he is clinically without bleeding.  Stools are brown and there is no signs of melena/bright red blood.  GI consulted, and did not recommend any further work-up at this point but only close monitoring.  Ok to continue aspirin from GI standpoint. -He was transfused unit of packed red blood cells on 9/18 for hemoglobin of 7.7.  Continue to closely monitor hemoglobin   Hyperlipidemia-continue statin   Hypertension-hold ACE inhibitor's   DM2 - A1c, SSI, glucose checks, DM diet  DVT prophylaxis: SCD Code Status: full  Family Communication: none at bedside Disposition:   Status is: Inpatient  Remains inpatient appropriate because:Inpatient level of care appropriate due to severity of illness  Dispo: The patient is from: Home              Anticipated d/c is to: Home              Patient currently is not medically stable to d/c.   Difficult to place patient No   Consultants:  GI Cardiology Renal ID Ortho   Procedures:  Echo IMPRESSIONS     1. Left ventricular ejection fraction, by estimation, is 50 to 55%. The  left ventricle has low normal function. The left ventricle demonstrates  regional wall motion abnormalities (see scoring diagram/findings for  description). There is mild left  ventricular hypertrophy. Left ventricular diastolic parameters were  normal.   2. Right ventricular systolic function is normal. The right ventricular  size is normal. Tricuspid regurgitation signal is inadequate for assessing  PA pressure.   3. Left atrial size was severely dilated.   4. Right atrial size was mildly dilated.   5. The mitral valve is grossly normal. Mild mitral valve regurgitation.   6. The  aortic valve is tricuspid. Aortic valve regurgitation is not  visualized. No aortic stenosis is present. Aortic valve mean gradient  measures 4.0 mmHg.   7. The inferior vena cava is dilated in size with >50% respiratory  variability, suggesting right atrial pressure of 8 mmHg.   Comparison(s): No prior Echocardiogram.   Antimicrobials:  Anti-infectives (From admission, onward)    Start     Dose/Rate Route Frequency Ordered Stop   02/07/21 1700  linezolid (ZYVOX) tablet 600 mg        600 mg Oral Every 12 hours 02/07/21 1556     02/07/21 1500  DAPTOmycin (CUBICIN) 700 mg in sodium chloride 0.9 % IVPB  Status:  Discontinued        8 mg/kg  90.1 kg (Adjusted) 128 mL/hr over 30 Minutes Intravenous Daily 02/07/21 1055 02/07/21 1556      Subjective: No complaints at this time Concerned about his pets at home  Objective: Vitals:   02/06/21 2234 02/07/21 0853 02/07/21 1211 02/07/21 1551  BP: (!) 142/79 (!) 169/77 121/72 140/78  Pulse: 70 73 61 60  Resp: 18 18 15 16   Temp:  98.3 F (36.8 C) 97.6 F (36.4 C) 97.8 F (36.6 C)  TempSrc:  Oral Oral Oral  SpO2: 96% 97% 98% 99%  Weight:      Height:        Intake/Output Summary (Last 24 hours) at 02/07/2021 1744 Last data filed at 02/07/2021 0800 Gross per 24 hour  Intake 240 ml  Output --  Net 240 ml   Filed Weights   02/04/21 0447 02/05/21 1327 02/06/21 0953  Weight: 116.8 kg 115.8 kg 112.4 kg    Examination:  General exam: Appears calm and comfortable  Respiratory system: unlabored Cardiovascular system: RRR Gastrointestinal system: Abdomen is nondistended, soft and nontender.  Central nervous system: Alert and oriented. No focal neurological deficits. Extremities: bilateral metatarsal amputations - mild redness to R foot Psychiatry: Judgement and insight appear normal. Mood & affect appropriate.     Data Reviewed: I have personally reviewed following labs and imaging studies  CBC: Recent Labs  Lab  02/01/21 0646 02/01/21 1913 02/03/21 0553 02/04/21 0315 02/05/21 0352 02/06/21 0339 02/07/21 0646  WBC 10.4   < > 9.5 11.0* 9.1 11.4* 12.3*  NEUTROABS 7.5  --   --   --   --   --   --   HGB 8.5*   < > 7.7* 8.5* 7.9* 9.4* 8.4*  HCT 27.3*   < > 24.7* 26.5* 24.8* 29.1* 25.9*  MCV 93.2   < > 91.8 88.6 89.9 89.3 89.0  PLT 262   < > 219 205 198 233 225   < > = values in this interval not displayed.    Basic Metabolic Panel: Recent Labs  Lab 02/03/21 0553 02/04/21 0315 02/05/21 0352 02/06/21 0339 02/07/21 0646  NA 140 139 138 138 137  K 5.4* 4.4 4.5 4.3 3.8  CL 106 105 104 101 98  CO2 27 24 24 25 29   GLUCOSE 149* 161* 133* 141* 148*  BUN 106* 100* 105* 111* 99*  CREATININE 2.05* 1.98* 1.99* 2.18* 2.30*  CALCIUM 8.2* 8.2* 8.1* 8.4* 8.0*  MG 2.3  --  2.0 1.9  --     GFR: Estimated Creatinine Clearance: 45.2 mL/min (Teneil Shiller) (by C-G formula based on SCr of 2.3 mg/dL (H)).  Liver Function Tests: Recent Labs  Lab 02/01/21 0646 02/02/21 0236 02/03/21 0553 02/04/21 0315 02/05/21 0352  AST 16 13* 14* 19 16  ALT 6 5 6 6 8   ALKPHOS 80 70 65 60 66  BILITOT 1.1 0.7 0.6 1.4* 1.4*  PROT 8.2* 7.7 7.4 7.5 6.7  ALBUMIN 2.5* 2.3* 2.3* 2.5* 2.3*    CBG: Recent Labs  Lab 02/06/21 1752 02/06/21 2030 02/07/21 0834 02/07/21 1135 02/07/21 1550  GLUCAP 156* 186* 151* 194* 134*     Recent Results (from the past 240 hour(s))  Resp Panel by RT-PCR (Flu Kahlea Cobert&B, Covid) Nasopharyngeal Swab     Status: None   Collection Time: 02/01/21  7:42 AM   Specimen: Nasopharyngeal Swab; Nasopharyngeal(NP) swabs in vial transport medium  Result Value Ref Range Status   SARS Coronavirus 2 by RT PCR NEGATIVE NEGATIVE Final    Comment: (NOTE) SARS-CoV-2 target nucleic acids are NOT DETECTED.  The SARS-CoV-2 RNA is generally detectable in upper respiratory specimens during the acute phase of infection. The lowest concentration of SARS-CoV-2 viral copies this assay can detect is 138 copies/mL. Robie Oats  negative result does not preclude SARS-Cov-2 infection and should not be used as the sole basis for treatment or other patient management decisions. Lihanna Biever negative result may  occur with  improper specimen collection/handling, submission of specimen other than nasopharyngeal swab, presence of viral mutation(s) within the areas targeted by this assay, and inadequate number of viral copies(<138 copies/mL). Finneus Kaneshiro negative result must be combined with clinical observations, patient history, and epidemiological information. The expected result is Negative.  Fact Sheet for Patients:  BloggerCourse.com  Fact Sheet for Healthcare Providers:  SeriousBroker.it  This test is no t yet approved or cleared by the [place] FDA and  has been authorized for detection and/or diagnosis of SARS-CoV-2 by FDA under an Emergency Use Authorization (EUA). This EUA will remain  in effect (meaning this test can be used) for the duration of the COVID-19 declaration under Section 564(b)(1) of the Act, 21 U.S.C.section 360bbb-3(b)(1), unless the authorization is terminated  or revoked sooner.       Influenza Sid Greener by PCR NEGATIVE NEGATIVE Final   Influenza B by PCR NEGATIVE NEGATIVE Final    Comment: (NOTE) The Xpert Xpress SARS-CoV-2/FLU/RSV plus assay is intended as an aid in the diagnosis of influenza from Nasopharyngeal swab specimens and should not be used as Pancho Rushing sole basis for treatment. Nasal washings and aspirates are unacceptable for Xpert Xpress SARS-CoV-2/FLU/RSV testing.  Fact Sheet for Patients: BloggerCourse.com  Fact Sheet for Healthcare Providers: SeriousBroker.it  This test is not yet approved or cleared by the [place] FDA and has been authorized for detection and/or diagnosis of SARS-CoV-2 by FDA under an Emergency Use Authorization (EUA). This EUA will remain in effect (meaning this test can  be used) for the duration of the COVID-19 declaration under Section 564(b)(1) of the Act, 21 U.S.C. section 360bbb-3(b)(1), unless the authorization is terminated or revoked.  Performed at [facility], [address], [place], [place] [address]          Radiology Studies: MR FOOT RIGHT WO CONTRAST  Result Date: 02/06/2021 CLINICAL DATA:  Diabetic with foot pain and swelling. History of prior mid metatarsal amputations. EXAM: MRI OF THE RIGHT FOREFOOT WITHOUT CONTRAST TECHNIQUE: Multiplanar, multisequence MR imaging of the right foot was performed. No intravenous contrast was administered. COMPARISON:  Prior MRI from 2018 FINDINGS: Mid metatarsal amputations are noted. There is diffuse abnormal T1 and T2 signal intensity in the first, second and third metatarsals highly suspicious for osteomyelitis. The fourth and fifth metatarsals are unremarkable. Diffuse subcutaneous soft tissue swelling/edema/fluid suggesting cellulitis. No discrete fluid collection to suggest Avionna Bower drainable soft tissue abscess. Diffuse myofasciitis without definite findings for pyomyositis. Moderate midfoot (tarsal metatarsal) degenerative changes but no findings to suggest septic arthritis. The tibiotalar and subtalar joints are maintained. No findings for hindfoot septic arthritis or osteomyelitis. Slight thickening and increased T2 signal intensity in the plantar fascia near its attachment site suggesting mild-to-moderate plantar fasciitis. The major ankle tendons and ligaments are grossly intact. IMPRESSION: 1. Findings highly suspicious for osteomyelitis involving the first, second and third metatarsals. 2. Diffuse cellulitis and myofasciitis without definite findings for subcutaneous abscess or pyomyositis. 3. No findings for septic arthritis or osteomyelitis involving the midfoot or hindfoot. 4. Plantar fasciitis. Electronically Signed   By: Rudie Meyer M.D.   On: 02/06/2021 13:41        Scheduled  Meds:  aspirin EC  81 mg Oral Daily   insulin aspart  0-15 Units Subcutaneous TID WC   insulin aspart  0-5 Units Subcutaneous QHS   insulin glargine-yfgn  5 Units Subcutaneous Daily   isosorbide-hydrALAZINE  1 tablet Oral TID   linezolid  600 mg Oral Q12H  metolazone  10 mg Oral Daily   metoprolol succinate  50 mg Oral Daily   pantoprazole  40 mg Oral BID   rosuvastatin  20 mg Oral QPM   Continuous Infusions:  furosemide 120 mg (02/07/21 1143)     LOS: 6 days    Time spent: over 30 min    Lacretia Nicks, MD [facility]   To contact the attending provider between 7A-7P or the covering provider during after hours 7P-7A, please log into the web site [email or web address] and access using universal [place] password for that web site. If you do not have the password, please call the hospital operator.  02/07/2021, 5:44 PM

## 2021-02-07 NOTE — Progress Notes (Signed)
Patient is refusing daptomycin, he believes this is the medication he received during a previous hospital admission in [place], [place]. He stated " it shut my kidneys down"  Infectious disease MD was paged.

## 2021-02-08 ENCOUNTER — Other Ambulatory Visit (HOSPITAL_COMMUNITY): Payer: Self-pay

## 2021-02-08 DIAGNOSIS — N179 Acute kidney failure, unspecified: Secondary | ICD-10-CM | POA: Diagnosis not present

## 2021-02-08 DIAGNOSIS — R7881 Bacteremia: Secondary | ICD-10-CM | POA: Diagnosis not present

## 2021-02-08 DIAGNOSIS — I5033 Acute on chronic diastolic (congestive) heart failure: Secondary | ICD-10-CM | POA: Diagnosis not present

## 2021-02-08 DIAGNOSIS — M86471 Chronic osteomyelitis with draining sinus, right ankle and foot: Secondary | ICD-10-CM | POA: Diagnosis not present

## 2021-02-08 LAB — CBC WITH DIFFERENTIAL/PLATELET
Abs Immature Granulocytes: 0.05 10*3/uL (ref 0.00–0.07)
Basophils Absolute: 0 10*3/uL (ref 0.0–0.1)
Basophils Relative: 0 %
Eosinophils Absolute: 0.2 10*3/uL (ref 0.0–0.5)
Eosinophils Relative: 1 %
HCT: 26 % — ABNORMAL LOW (ref 39.0–52.0)
Hemoglobin: 8.5 g/dL — ABNORMAL LOW (ref 13.0–17.0)
Immature Granulocytes: 0 %
Lymphocytes Relative: 14 %
Lymphs Abs: 2 10*3/uL (ref 0.7–4.0)
MCH: 29 pg (ref 26.0–34.0)
MCHC: 32.7 g/dL (ref 30.0–36.0)
MCV: 88.7 fL (ref 80.0–100.0)
Monocytes Absolute: 1.4 10*3/uL — ABNORMAL HIGH (ref 0.1–1.0)
Monocytes Relative: 10 %
Neutro Abs: 10.8 10*3/uL — ABNORMAL HIGH (ref 1.7–7.7)
Neutrophils Relative %: 75 %
Platelets: 234 10*3/uL (ref 150–400)
RBC: 2.93 MIL/uL — ABNORMAL LOW (ref 4.22–5.81)
RDW: 17 % — ABNORMAL HIGH (ref 11.5–15.5)
WBC: 14.5 10*3/uL — ABNORMAL HIGH (ref 4.0–10.5)
nRBC: 0 % (ref 0.0–0.2)

## 2021-02-08 LAB — COMPREHENSIVE METABOLIC PANEL
ALT: 8 U/L (ref 0–44)
AST: 12 U/L — ABNORMAL LOW (ref 15–41)
Albumin: 2.1 g/dL — ABNORMAL LOW (ref 3.5–5.0)
Alkaline Phosphatase: 51 U/L (ref 38–126)
Anion gap: 13 (ref 5–15)
BUN: 100 mg/dL — ABNORMAL HIGH (ref 6–20)
CO2: 27 mmol/L (ref 22–32)
Calcium: 8 mg/dL — ABNORMAL LOW (ref 8.9–10.3)
Chloride: 97 mmol/L — ABNORMAL LOW (ref 98–111)
Creatinine, Ser: 2.53 mg/dL — ABNORMAL HIGH (ref 0.61–1.24)
GFR, Estimated: 29 mL/min — ABNORMAL LOW (ref >60)
Glucose, Bld: 172 mg/dL — ABNORMAL HIGH (ref 70–99)
Potassium: 3.6 mmol/L (ref 3.5–5.1)
Sodium: 137 mmol/L (ref 135–145)
Total Bilirubin: 1.1 mg/dL (ref 0.3–1.2)
Total Protein: 7 g/dL (ref 6.5–8.1)

## 2021-02-08 LAB — GLUCOSE, CAPILLARY
Glucose-Capillary: 159 mg/dL — ABNORMAL HIGH (ref 70–99)
Glucose-Capillary: 182 mg/dL — ABNORMAL HIGH (ref 70–99)

## 2021-02-08 LAB — PHOSPHORUS: Phosphorus: 4.7 mg/dL — ABNORMAL HIGH (ref 2.5–4.6)

## 2021-02-08 LAB — MAGNESIUM: Magnesium: 1.7 mg/dL (ref 1.7–2.4)

## 2021-02-08 LAB — CK: Total CK: 39 U/L — ABNORMAL LOW (ref 49–397)

## 2021-02-08 MED ORDER — LINEZOLID 600 MG PO TABS
600.0000 mg | ORAL_TABLET | Freq: Two times a day (BID) | ORAL | 0 refills | Status: AC
  Filled 2021-02-08: qty 28, 14d supply, fill #0

## 2021-02-08 MED ORDER — HYDRALAZINE HCL 25 MG PO TABS
37.5000 mg | ORAL_TABLET | Freq: Three times a day (TID) | ORAL | 0 refills | Status: AC
  Filled 2021-02-08: qty 135, 30d supply, fill #0

## 2021-02-08 MED ORDER — PANTOPRAZOLE SODIUM 40 MG PO TBEC
40.0000 mg | DELAYED_RELEASE_TABLET | Freq: Two times a day (BID) | ORAL | 0 refills | Status: AC
  Filled 2021-02-08: qty 60, 30d supply, fill #0

## 2021-02-08 MED ORDER — METOPROLOL SUCCINATE ER 50 MG PO TB24
50.0000 mg | ORAL_TABLET | Freq: Every day | ORAL | 0 refills | Status: AC
  Filled 2021-02-08: qty 30, 30d supply, fill #0

## 2021-02-08 MED ORDER — FUROSEMIDE 80 MG PO TABS
80.0000 mg | ORAL_TABLET | Freq: Every day | ORAL | 0 refills | Status: AC
  Filled 2021-02-08: qty 30, 30d supply, fill #0

## 2021-02-08 MED ORDER — ISOSORB DINITRATE-HYDRALAZINE 20-37.5 MG PO TABS
1.0000 | ORAL_TABLET | Freq: Three times a day (TID) | ORAL | 0 refills | Status: DC
  Filled 2021-02-08: qty 90, 30d supply, fill #0

## 2021-02-08 MED ORDER — ISOSORBIDE DINITRATE 20 MG PO TABS
20.0000 mg | ORAL_TABLET | Freq: Three times a day (TID) | ORAL | 0 refills | Status: AC
  Filled 2021-02-08: qty 90, 30d supply, fill #0

## 2021-02-08 MED ORDER — ROSUVASTATIN CALCIUM 20 MG PO TABS
10.0000 mg | ORAL_TABLET | Freq: Every evening | ORAL | 0 refills | Status: AC
  Filled 2021-02-08: qty 15, 30d supply, fill #0

## 2021-02-08 NOTE — Plan of Care (Addendum)
Education complete. No questions. IV removed. Patient called ride. Meds from TOC at bedside. Will continue to monitor pt until DC home.   Problem: Education: Goal: Knowledge of General Education information will improve Description: Including pain rating scale, medication(s)/side effects and non-pharmacologic comfort measures Outcome: Progressing   Problem: Activity: Goal: Risk for activity intolerance will decrease Outcome: Progressing   Problem: Pain Managment: Goal: General experience of comfort will improve Outcome: Progressing   Problem: Safety: Goal: Ability to remain free from injury will improve Outcome: Progressing   Problem: Skin Integrity: Goal: Risk for impaired skin integrity will decrease Outcome: Progressing

## 2021-02-08 NOTE — Progress Notes (Signed)
[facility]  Date of Admission:  02/01/2021           Reason for visit: Follow up on OM and MRSA bacteremia  Current antibiotics: Linezolid 9/22-present  Previous antibiotics: Daptomycin (no doses given)  ASSESSMENT:    55 y.o. male admitted with:  Right foot osteomyelitis complicated by MRSA bacteremia at OSH: Paper records currently not available from [facility] and [facility] in [place], [place] [facility] has requested them).  We were able to discuss with a pharmacist there yesterday who stated patient did not receive daptomycin, but did receive vancomycin in addition to cefepime and Flagyl.  However, patient is quite certain that he did receive daptomycin and, thus, is declining any doses here.  It appears that he probably received about 2.5 weeks of antibiotics for this infection at the OSH.  He does not recall having a TEE but did have what was described as a TTE.  Repeat TTE this admission was unremarkable.  Follow-up blood cultures obtained yesterday are currently no growth to date. Diabetes: A1c 6.3. AKI/CKD: Nephrology following.  Presumed now has CKD.  Creatinine today slightly worse than yesterday. Acute on chronic HFpEF: Cardiology following. CAD Anemia: Hemoglobin stable and status post GI bleed during recent hospitalization in [place].  RECOMMENDATIONS:    Would recommend patient continue to stay here and receive appropriate antibiotics with daptomycin However given that he is declining this antibiotic, will continue linezolid for now If he were to leave AMA, would recommend sending home with 2 weeks of linezolid until he can follow-up with his providers at [facility].  He has previously followed with Dr Jennette Bill at [facility] and his PCP placed referral back to their office on 01/31/21. Agree with Dr. [name] that the best course of action would be BKA for definitive source control Will follow.  Dr. Renold Don available over the weekend as needed.   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 aspirin EC  81 mg Oral Daily  . insulin aspart  0-15 Units Subcutaneous TID WC  . insulin aspart  0-5 Units Subcutaneous QHS  . insulin glargine-yfgn  5 Units Subcutaneous Daily  . isosorbide-hydrALAZINE  1 tablet Oral TID  . linezolid  600 mg Oral Q12H  . metolazone  10 mg Oral Daily  . metoprolol succinate  50 mg Oral Daily  . pantoprazole  40 mg Oral BID  . rosuvastatin  20 mg Oral QPM   Continuous Infusions: PRN Meds:.acetaminophen **OR** acetaminophen, hydrALAZINE, HYDROcodone-acetaminophen, prochlorperazine  SUBJECTIVE:   24 hour events:  Patient declining daptomycin overnight as he believes this was what caused his renal failure at OSH.   However, discussed with pharmacist at that institution who reports he was only given vancomycin in addition to cefepime and Flagyl.   Transitioned to linezolid overnight due to his not wanting to receive daptomycin. Dr. [name] following and will follow up with patient on Monday regarding need for BKA. WBC slightly increased to 14.5 Worsening creatinine today at 2.5 Blood cultures drawn here yesterday no growth No new imaging  Patient reports that he has made follow-up plans with his previous foot surgeon at [facility] for early next week.  He is currently declining BKA.  States that he is going to sign out today or over the weekend to make these appointments.  He did not notify his surgeon that he is currently hospitalized.  He is declining daptomycin as he believes this is what caused his kidneys to shut down at outside hospital in [place].  Review of Systems  All other systems reviewed and are negative.    OBJECTIVE:   Blood  pressure 140/72, pulse 65, temperature (!) 97.5 F (36.4 C), temperature source Oral, resp. rate 17, height 5\' 11"  (1.803 m), weight 112.4 kg, SpO2 98 %. Body mass index is 34.56 kg/m.  Physical Exam Constitutional:      General: He is not in acute distress.    Appearance: Normal appearance.  HENT:     Head: Normocephalic and atraumatic.  Eyes:     Extraocular Movements: Extraocular movements intact.     Conjunctiva/sclera: Conjunctivae normal.  Pulmonary:     Effort: Pulmonary effort is normal. No respiratory distress.  Abdominal:     General: There is no distension.  Musculoskeletal:     Cervical back: Normal range of motion and neck supple.     Right lower leg: Edema present.     Left lower leg: Edema present.     Comments: Bilateral TMA.  Skin:    General: Skin is warm and dry.     Findings: No rash.  Neurological:     General: No focal deficit present.     Mental Status: He is alert and oriented to person, place, and time.  Psychiatric:        Mood and Affect: Mood normal.        Behavior: Behavior normal.     Lab Results: Lab Results  Component Value Date   WBC 14.5 (H) 02/08/2021   HGB 8.5 (L) 02/08/2021   HCT 26.0 (L) 02/08/2021   MCV 88.7 02/08/2021   PLT 234 02/08/2021    Lab Results  Component Value Date   NA 137 02/08/2021   K 3.6 02/08/2021   CO2 27 02/08/2021   GLUCOSE 172 (H) 02/08/2021   BUN 100 (H) 02/08/2021   CREATININE 2.53 (H) 02/08/2021   CALCIUM 8.0 (L) 02/08/2021   GFRNONAA 29 (L) 02/08/2021   GFRAA 76 01/01/2018    Lab Results  Component Value Date   ALT 8 02/08/2021   AST 12 (L) 02/08/2021   ALKPHOS 51 02/08/2021   BILITOT 1.1 02/08/2021       Component Value Date/Time   CRP 21.0 (H) 08/22/2016 2235       Component Value Date/Time   ESRSEDRATE 132 (H) 08/22/2016 2235     I have reviewed the micro and lab results in Epic.  Imaging: MR FOOT RIGHT WO CONTRAST  Result Date: 02/06/2021 CLINICAL DATA:  Diabetic with  foot pain and swelling. History of prior mid metatarsal amputations. EXAM: MRI OF THE RIGHT FOREFOOT WITHOUT CONTRAST TECHNIQUE: Multiplanar, multisequence MR imaging of the right foot was performed. No intravenous contrast was administered. COMPARISON:  Prior MRI from 2018 FINDINGS: Mid metatarsal amputations are noted. There is diffuse abnormal T1 and T2 signal intensity in the first, second and third metatarsals highly suspicious for osteomyelitis. The fourth and fifth metatarsals are unremarkable. Diffuse subcutaneous soft tissue swelling/edema/fluid suggesting cellulitis. No discrete fluid collection to suggest a drainable soft tissue abscess. Diffuse myofasciitis without definite findings for pyomyositis. Moderate midfoot (tarsal metatarsal) degenerative changes but no findings to suggest septic arthritis. The tibiotalar and  subtalar joints are maintained. No findings for hindfoot septic arthritis or osteomyelitis. Slight thickening and increased T2 signal intensity in the plantar fascia near its attachment site suggesting mild-to-moderate plantar fasciitis. The major ankle tendons and ligaments are grossly intact. IMPRESSION: 1. Findings highly suspicious for osteomyelitis involving the first, second and third metatarsals. 2. Diffuse cellulitis and myofasciitis without definite findings for subcutaneous abscess or pyomyositis. 3. No findings for septic arthritis or osteomyelitis involving the midfoot or hindfoot. 4. Plantar fasciitis. Electronically Signed   By: Rudie Meyer M.D.   On: 02/06/2021 13:41     Imaging independently reviewed in Epic.    Vedia Coffer for Infectious Disease [facility] [phone] pager 02/08/2021, 8:36 AM  I spent greater than 35 minutes with the patient including greater than 50% of time in face to face counsel of the patient and in coordination of their care.

## 2021-02-08 NOTE — Progress Notes (Signed)
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  Neuro is alert, Ox 3 , nf      Date                           Creat               eGFR   2009- 2015                0.90- 1.21   2016- 2017                1.02- 1.48   2018                          1.15- 1.64   2019                          1.12- 1.30        62- 71   Sept 16, 2022           1.97                 39   Sept 17                      1.94                 40    Sept 18                     2.05                 37      Home meds include asa, trulicity, glucotrol, norco prn, crestor, zanaflex prn, uloric, neurontin, lantus insulin 36 bid, lisiniopril $RemoveBeforeDE'5mg'pKuJESnNTofyjdJ$  qd     Renal US - 12.6 / 11.9 cm kidneys. Mild L hydro, bladder not distended, bilat jets not seen.     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 Creat on admit here was 1.9, in setting of resp distress and vol overload/ pulm edema. Renal US w/ mild L hydro, nothing on R. Hx of multiple kidney stones over the years. UA +proteinuria, urine lytes c/w CKD. Started high-dose IV lasix 9/18. Pt diuresed and resp issues resolved, still has LE edema but creat bump today to 2.5 so doesn't need further aggressive diuresis. This is likely CKD 3b w/ new baseline creat around 1.9- 2.2. Pt  states he is leaving today. Would start  him on lasix 80 po qam to start on 9/25 (Sunday). Our office will contact him to arrange f/u in 2-3 wks.  CAD - recently dx'd by [facility]. Has cardiologist and PCP at [place]. Cardiology has seen here.  PAD / osteo R foot - on IV daptomycin. Seen by ortho.  H/o kidney stones - has had stents placed / removed in the past  HTN - holding lisinopril while diuresing, getting toprol xl 50 qd  IDDM - per pmd Volume - vol overload as above Anemia - Hb 7- 8 range, sp massive GI bleed during recent hosp stay in [place] 02/08/2021, 10:49 AM   Recent Labs  Lab 02/07/21 0646 02/08/21 0440  K 3.8 3.6  BUN 99* 100*  CREATININE 2.30* 2.53*  CALCIUM 8.0* 8.0*  PHOS  --  4.7*  HGB 8.4* 8.5*    Inpatient medications:  aspirin EC  81 mg Oral Daily   insulin aspart  0-15 Units Subcutaneous TID WC   insulin aspart  0-5 Units Subcutaneous QHS   insulin glargine-yfgn  5 Units Subcutaneous Daily   isosorbide-hydrALAZINE  1 tablet Oral TID   linezolid  600 mg Oral Q12H   metolazone  10 mg Oral Daily   metoprolol succinate  50 mg Oral Daily   pantoprazole  40 mg Oral BID   rosuvastatin  20 mg Oral QPM     acetaminophen **OR** acetaminophen, hydrALAZINE, HYDROcodone-acetaminophen, prochlorperazine

## 2021-02-08 NOTE — TOC Benefit Eligibility Note (Signed)
Patient Advocate Encounter   Received notification that prior authorization for Linezolid 600 mg is required.   PA submitted on 02/08/2021 Key [id number] Status is pending       Roland Earl, CPhT Pharmacy Patient Advocate Specialist [facility] Antimicrobial Stewardship Team Direct Number: [phone]  Fax: [phone]

## 2021-02-08 NOTE — Discharge Summary (Signed)
Physician Discharge Summary  [name] Snow EXN:[id number] DOB: February 18, 1966 DOA: 02/01/2021  PCP: Patient, No Pcp Per (Inactive)  Admit date: 02/01/2021 Discharge date: 02/08/2021  Time spent: 40 minutes  Recommendations for Outpatient Follow-up:  discharging against medical advice Follow outpatient CBC/CMP follow with outpatient orthopedics and infectious disease - discharged with 2 weeks of linezolid for right foot osteo - we recommended BKA here Follow with cardiology outpatient, demand ischemia here with troponin bump - discharged on aspirin, metop, bidil, statin - he's undergoing evaluation for CABG at [facility]  Follow with cardiology outpatient for heart failure  Follow with renal outpatient for CKD - starting lasix 80 mg daily on Sunday - follow up 2-3 weeks with [place] dose adjusted to 10 mg with renal function - lisinopril on hold as well  Gi follow up  Follow mild L hydro  Discharge Diagnoses:  Principal Problem:   MRSA bacteremia Active Problems:   Coronary artery disease   Open wound of right foot   Symptomatic anemia   Acute on chronic heart failure with preserved ejection fraction (HCC)   Elevated troponin   Chronic osteomyelitis of right foot with draining sinus ([place])   Discharge Condition: stable  Diet recommendation: diabetic  Filed Weights   02/05/21 1327 02/06/21 0953 02/08/21 0853  Weight: 115.8 kg 112.4 kg 108.8 kg    History of present illness:  55 year old male with history of CAD, chronic kidney disease stage IIIa with baseline creatinine 1.2-1.3, obesity, hypertension, hyperlipidemia, comes into the hospital with complaints of weakness, shortness of breath, lower extremity swelling and difficulty laying flat.  Patient had [name] Snow recent prolonged hospitalization for about 19 days in [place].  Apparently presented there for infected right foot and he is now status post metatarsal amputation.  He was given  antibiotics in the setting of infection which led to renal failure and he required HD x1.  Reportedly, creatinine was as high as 6.5 when he was hospitalized there and he believes that it was around 4 when he left but he is not sure.  That hospital course was also complicated by GI bleed with bright red blood per rectum/melena for which he underwent Karlton Maya GI evaluation with EGD, colonoscopy as well as Jordie Skalsky capsule study and apparently without Kimeka Badour clear-cut source of his bleed.  He required Amar Sippel total of 6 units of packed red blood cells.  He was supposed to leave with IV antibiotics and PICC line, but he left AMA.  He is also had Jaymond Waage recent STEMI in April, was hospitalized at [facility] and underwent stenting of the proximal and mid RCA with PTCA to the ostial and proximal PDA.  EF was 40-45%.  He also was found to have severe three-vessel disease with severe stenosis of the OM and LAD/D1 and currently is in the process of getting bypass surgery but it was delayed due to his hospitalization   He was admitted with [name] Snow heart failure exacerbation and volume overload.  He's improved with diuresis.  He had an MRI of his right foot which showed osteomyelitis of the 1st, 2nd, and 3rd metatarsals.  Per ID, apparently patient's previous hospitalization was complicated by bacteremia (MRSA) as well, records pending.  ID was consulted and orthopedics was consulted.  Dr. Sharol Given recommending right BKA.  Patient wants to leave for second opinion.  ID has recommended linezolid if he discharges against medical advice.    Hospitalization also complicated by demand ischemia.  Medications adjusted.  He's  leaving against medical advice.  Recommend outpatient follow up.   Hospital Course:  Osteomyelitis of right foot metatarsals MRSA Bacteremia MRI shows findings concerning for osteomyelitis at the first, second, and third metatarsals.  Diffuse cellulitis and myofasciitis without definitive findings for abscess or pyomyositis.   MRSA  Bacteremia diagnosed at outside hospital per infectious disease - will try to obtain outside records (looks like he was at [facility] in [place], discharged on 8/24) Dr. Sharol Given recommending right BKA, he wants [name] Snow second opinion and he's planning to leave for this today ID following, appreciate recommendations - recommending IV antibiotics with dapto, but he's refusing this -> recommended 2 weeks of linezolid until he follows up with [facility] providers if he leaves AMA - will send with 2 weeks linezolid Blood cultures here pending   Acute on chronic combined CHF due to acute pulmonary edema in the setting of acute on chronic diastolic CHF patient admitted to the hospital for elevated BNP, fluid overload -2D echo done 9/17 showed EF 50-55%, low normal, as well as regional WMA.  It also showed mild LVH, normal RV. -diuresis per renal -> lasix 80 mg daily to resume on Sunday -Strict ins and outs, daily weights    Coronary artery disease, severe three-vessel disease, troponin elevation, NSTEMI type II -patient established care with Dr. Lunette Stands at [facility] for CABG in the near future.  Needs to follow with his cardiologist -troponin increased to 1283 -cardiology was consulted -appreciated Dr. Virgina Jock recommendations - suspects demand ischemia rather than ACS -Added metoprolol, bidil.  He was started on aspirin .  Continue crestor.     Acute kidney injury on chronic kidney disease stage IIIa  -Patient's creatinine on admission was 1.9 from baseline about 1.2-1.4.  He was recently hospitalized earlier this month requiring HD.  Patient recalls creatinine around 4 from his discharge but up to 6 as [name] peak -creatinine up to 2.5 today - renal US with mild L hydro - follow this outpatient  -diuresis per renal, appreciate assistance - arranging outpatient follow up - resume lasix 80 mg daily on Sunday   Hyperkalemia -Resolved, continue to monitor   Normocytic anemia, recent GI bleed-fecal  occult is positive however he is clinically without bleeding.  Stools are brown and there is no signs of melena/bright red blood.  GI consulted, and did not recommend any further work-up at this point but only close monitoring.  Ok to continue aspirin from GI standpoint. -He was transfused unit of packed red blood cells on 9/18 for hemoglobin of 7.7.  Continue to closely monitor hemoglobin - follow up with GI outpatient    Hyperlipidemia-continue statin   Hypertension-hold ACE inhibitor's   DM2 - resume home regimen   Procedures: IMPRESSIONS     1. Left ventricular ejection fraction, by estimation, is 50 to 55%. The  left ventricle has low normal function. The left ventricle demonstrates  regional wall motion abnormalities (see scoring diagram/findings for  description). There is mild left  ventricular hypertrophy. Left ventricular diastolic parameters were  normal.   2. Right ventricular systolic function is normal. The right ventricular  size is normal. Tricuspid regurgitation signal is inadequate for assessing  PA pressure.   3. Left atrial size was severely dilated.   4. Right atrial size was mildly dilated.   5. The mitral valve is grossly normal. Mild mitral valve regurgitation.   6. The aortic valve is tricuspid. Aortic valve regurgitation is not  visualized. No aortic stenosis is present.  Aortic valve mean gradient  measures 4.0 mmHg.   7. The inferior vena cava is dilated in size with >50% respiratory  variability, suggesting right atrial pressure of 8 mmHg.   Comparison(s): No prior Echocardiogram.   Consultations: ID Renal Orthopedics cardiology  Discharge Exam: Vitals:   02/07/21 2004 02/08/21 0853  BP: 140/72 (!) 155/79  Pulse: 65 68  Resp: 17 17  Temp: (!) 97.5 F (36.4 C) 98.5 F (36.9 C)  SpO2: 98% 99%   No new complaints Says he wants Neoma Uhrich second opinion, planning to leave Recommend that he stay and noted that he's leaving against our medical advice.   Worry about worsening infection, complications including death and disability - he understands.   General: No acute distress. Cardiovascular: RRR Lungs: unlabored Abdomen: Soft, nontender, nondistended  Neurological: Alert and oriented 3. Moves all extremities 4 h. Cranial nerves II through XII grossly intact. Skin: Warm and dry. No rashes or lesions. Extremities: transmet amputations, dressings in placa  Discharge Instructions   Discharge Instructions     Call MD for:  difficulty breathing, headache or visual disturbances   Complete by: As directed    Call MD for:  hives   Complete by: As directed    Call MD for:  persistant dizziness or light-headedness   Complete by: As directed    Call MD for:  persistant nausea and vomiting   Complete by: As directed    Call MD for:  redness, tenderness, or signs of infection (pain, swelling, redness, odor or green/yellow discharge around incision site)   Complete by: As directed    Call MD for:  severe uncontrolled pain   Complete by: As directed    Call MD for:  temperature >100.4   Complete by: As directed    Diet - low sodium heart healthy   Complete by: As directed    Discharge instructions   Complete by: As directed    You're leaving against our medical advice at this time.   You were hospitalized related to multiple medical problems.  You have osteomyelitis (Breckan Cafiero bone infection) in the setting of Dyllin Gulley previous blood stream infection at the outside hospital.  This needs continued antibiotic therapy and management.  We've recommended an amputation, Dr. Sharol Given was planning for this in house.  You're planning to leave against medical advice for Fuller Makin second opinion with your doctors at [facility].  Watch for worsening fevers, pain, drainage, or other new or concerning signs.  We'll discharge you with 2 weeks of linezolid while you follow up with your [facility] doctors, but it's extremely important you follow up with these doctors so they can ensure  appropriate continued management of your bone infection (which was complicated by Philicia Heyne blood stream infection at your previous hospital stay).   You been treated for Siaosi Alter heart failure exacerbation with diuretics.  Resume your lasix at 80 mg daily on Sunday.  Follow up with cardiology as an outpatient.  Follow up with the kidney doctors as an outpatient for your kidney disease.  You had elevated cardiac enzymes, likely due to demand.  Continue metoprolol and bidil.  Continue aspirin and crestor.  Follow up with your cardiologist outpatient for CABG planning.  Return for new, recurrent, or worsening symptoms.  We prefer you remain inpatient for continued management of your issues.  It's extremely important that you manage your infection promptly as an outpatient with orthopedics and infectious disease.  Please ask your PCP to request records from this hospitalization so they  know what was done and what the next steps will be.   Discharge wound care:   Complete by: As directed    Change dressing to bilat feet as follows: Apply xeroform gauze to wounds, then cover with kerlex and ace wrap   Increase activity slowly   Complete by: As directed       Allergies as of 02/08/2021       Reactions   No Known Allergies         Medication List     STOP taking these medications    febuxostat 40 MG tablet Commonly known as: Uloric   gabapentin 400 MG capsule Commonly known as: NEURONTIN   insulin glargine 100 UNIT/ML Solostar Pen Commonly known as: Lantus SoloStar   lisinopril 10 MG tablet Commonly known as: ZESTRIL       TAKE these medications    aspirin 81 MG EC tablet Take 81 mg by mouth daily.   blood glucose meter kit and supplies Kit Dispense based on insurance preference. Use bid as directed. (FOR ICD-9 250.00)   clotrimazole-betamethasone cream Commonly known as: Lotrisone Apply 1 application topically 2 (two) times daily. What changed:  when to take this reasons to take  this   Dulaglutide 1.5 MG/0.5ML Sopn Commonly known as: Trulicity Inject 1.5 Units into the skin once Quinta Eimer week.   FreeStyle Libre 14 Day Sensor Misc 1 Device by Does not apply route every 14 (fourteen) days.   furosemide 80 MG tablet Commonly known as: Lasix Take 1 tablet (80 mg total) by mouth daily. Start taking on: February 10, 2021   glipiZIDE 10 MG tablet Commonly known as: GLUCOTROL Take 10 mg by mouth daily as needed (if glucose above 180).   glucose blood test strip Use as instructed   HYDROcodone-acetaminophen 10-325 MG tablet Commonly known as: NORCO Take 1 tablet by mouth every 4 (four) hours as needed. What changed: Another medication with the same name was removed. Continue taking this medication, and follow the directions you see here.   isosorbide-hydrALAZINE 20-37.5 MG tablet Commonly known as: BIDIL Take 1 tablet by mouth 3 (three) times daily.   Lancets Misc 1 Device by Does not apply route 3 (three) times daily. E11.40   linezolid 600 MG tablet Commonly known as: ZYVOX Take 1 tablet (600 mg total) by mouth every 12 (twelve) hours for 14 days.   metoprolol succinate 50 MG 24 hr tablet Commonly known as: TOPROL-XL Take 1 tablet (50 mg total) by mouth daily. Take with or immediately following Tiawanna Luchsinger meal. Start taking on: February 09, 2021   pantoprazole 40 MG tablet Commonly known as: PROTONIX Take 1 tablet (40 mg total) by mouth 2 (two) times daily.   rosuvastatin 20 MG tablet Commonly known as: CRESTOR Take 1/2 tablet (10 mg total) by mouth every evening. What changed: how much to take   tiZANidine 4 MG tablet Commonly known as: Zanaflex Take 1 tablet (4 mg total) by mouth every 6 (six) hours as needed for muscle spasms.               Discharge Care Instructions  (From admission, onward)           Start     Ordered   02/08/21 0000  Discharge wound care:       Comments: Change dressing to bilat feet as follows: Apply xeroform gauze  to wounds, then cover with kerlex and ace wrap   02/08/21 1152  Allergies  Allergen Reactions   No Known Allergies     Follow-up Information     Newt Minion, MD Follow up in 1 week(s).   Specialty: Orthopedic Surgery Contact information: [place] [address] [phone]                  The results of significant diagnostics from this hospitalization (including imaging, microbiology, ancillary and laboratory) are listed below for reference.    Significant Diagnostic Studies: US RENAL  Result Date: 02/01/2021 CLINICAL DATA:  Acute kidney injury. EXAM: RENAL / URINARY TRACT ULTRASOUND COMPLETE COMPARISON:  Abdominal ultrasound 09/02/2007. CT abdomen and pelvis 07/22/2013. FINDINGS: Right Kidney: Renal measurements: 12.6 x 6.1 x 5.5 cm = volume: 220 mL. Echogenicity within normal limits. No mass or hydronephrosis visualized. Left Kidney: Renal measurements: 11.9 x 5.6 x 6.1 cm = volume: 212 mL. Echogenicity within normal limits. There is mild left-sided hydronephrosis. Bladder: Grossly within normal limits. Not fully distended. Bilateral ureteral jets are not visualized. Other: Left pleural effusion. Technically limited study secondary to body habitus and bowel gas. IMPRESSION: 1. Mild left-sided hydronephrosis. 2. Left pleural effusion. Electronically Signed   By: Ronney Asters M.D.   On: 02/01/2021 19:10   MR FOOT RIGHT WO CONTRAST  Result Date: 02/06/2021 CLINICAL DATA:  Diabetic with foot pain and swelling. History of prior mid metatarsal amputations. EXAM: MRI OF THE RIGHT FOREFOOT WITHOUT CONTRAST TECHNIQUE: Multiplanar, multisequence MR imaging of the right foot was performed. No intravenous contrast was administered. COMPARISON:  Prior MRI from 2018 FINDINGS: Mid metatarsal amputations are noted. There is diffuse abnormal T1 and T2 signal intensity in the first, second and third metatarsals highly suspicious for osteomyelitis. The fourth and  fifth metatarsals are unremarkable. Diffuse subcutaneous soft tissue swelling/edema/fluid suggesting cellulitis. No discrete fluid collection to suggest Shawnise Peterkin drainable soft tissue abscess. Diffuse myofasciitis without definite findings for pyomyositis. Moderate midfoot (tarsal metatarsal) degenerative changes but no findings to suggest septic arthritis. The tibiotalar and subtalar joints are maintained. No findings for hindfoot septic arthritis or osteomyelitis. Slight thickening and increased T2 signal intensity in the plantar fascia near its attachment site suggesting mild-to-moderate plantar fasciitis. The major ankle tendons and ligaments are grossly intact. IMPRESSION: 1. Findings highly suspicious for osteomyelitis involving the first, second and third metatarsals. 2. Diffuse cellulitis and myofasciitis without definite findings for subcutaneous abscess or pyomyositis. 3. No findings for septic arthritis or osteomyelitis involving the midfoot or hindfoot. 4. Plantar fasciitis. Electronically Signed   By: Marijo Sanes M.D.   On: 02/06/2021 13:41   DG CHEST PORT 1 VIEW  Result Date: 02/05/2021 CLINICAL DATA:  Follow-up pulmonary edema EXAM: PORTABLE CHEST 1 VIEW COMPARISON:  02/03/2021 FINDINGS: Check shadow is stable. Significant improvement in vascular congestion and pulmonary edema is noted bilaterally. Small effusions are again seen as well as some mild bibasilar atelectatic changes. No bony abnormality is noted. IMPRESSION: Significant improvement of previously seen vascular congestion and edema. Electronically Signed   By: Inez Catalina M.D.   On: 02/05/2021 15:50   DG CHEST PORT 1 VIEW  Result Date: 02/03/2021 CLINICAL DATA:  Pt c/o worsening sob since last night, unable to speak full sentences, only 2-3 words at Arlee Bossard time, states he cannot catch his breath. Hx of CHF, diabetes, HTN, nonsmoker EXAM: PORTABLE CHEST 1 VIEW COMPARISON:  02/01/2021 FINDINGS: Increase in bibasilar patchy airspace disease.  Low lung volumes. No pneumothorax. IMPRESSION: Increase in bibasilar airspace disease representing edema versus multifocal pneumonia. Electronically Signed  By: Suzy Bouchard M.D.   On: 02/03/2021 12:38   DG Chest Portable 1 View  Result Date: 02/01/2021 CLINICAL DATA:  Shortness of breath EXAM: PORTABLE CHEST 1 VIEW COMPARISON:  03/29/2012 FINDINGS: Interstitial coarsening with indistinct density at the bases. No Kerley lines or pleural effusion. Lung volumes are low, similar to prior. Generous heart size accentuated by mediastinal fat when compared with 2009 CT. Negative aortic and hilar contours. IMPRESSION: Indistinct density at the bases which could be atelectasis or atypical infection. Electronically Signed   By: Jorje Guild M.D.   On: 02/01/2021 07:56   DG Abd Portable 1V  Result Date: 02/04/2021 CLINICAL DATA:  Pain.  Recent GI bleed.  Abdominal pain. EXAM: PORTABLE ABDOMEN - 1 VIEW COMPARISON:  CT 05/04/2017.  Renal ultrasound 02/01/2021 FINDINGS: High-density material throughout the colon consistent with ingested material. No visualized radiopaque calculi, with evaluation of the renal beds partially obscured by this high-density material. No bowel dilatation to suggest obstruction. No concerning intraabdominal mass effect. Right hip arthroplasty. Degenerative change in the spine. IMPRESSION: 1. Nonobstructive bowel gas pattern. High-density material throughout the colon consistent with ingested contrast or bismuth containing products. 2. Punctate left renal calculi on prior CT are not seen, evaluation of the renal fossa partially obscured by overlying high-density material in the colon. Electronically Signed   By: Keith Rake M.D.   On: 02/04/2021 19:05   ECHOCARDIOGRAM COMPLETE  Result Date: 02/02/2021    ECHOCARDIOGRAM REPORT   Patient Name:   [name] Snow Date of Exam: 02/02/2021 Medical Rec #:  [id number]       Height:       71.0 in Accession #:    [id number]      Weight:        259.2 lb Date of Birth:  03/23/1966       BSA:          2.354 m Patient Age:    83 years        BP:           176/77 mmHg Patient Gender: M               HR:           68 bpm. Exam Location:  Inpatient Procedure: 2D Echo, Cardiac Doppler, Color Doppler and Intracardiac            Opacification Agent Indications:    CHF-acute diastolic  History:        Patient has no prior history of Echocardiogram examinations.                 Previous Myocardial Infarction and CAD; Risk Factors:Morbid                 obesity. MI in April awaiting CABG with [facility]. H/O sepsis.                 Prior echo in care everywhere.  Sonographer:    Merrie Roof RDCS Referring Phys: [id number] [place]  1. Left ventricular ejection fraction, by estimation, is 50 to 55%. The left ventricle has low normal function. The left ventricle demonstrates regional wall motion abnormalities (see scoring diagram/findings for description). There is mild left ventricular hypertrophy. Left ventricular diastolic parameters were normal.  2. Right ventricular systolic function is normal. The right ventricular size is normal. Tricuspid regurgitation signal is inadequate for assessing PA pressure.  3. Left atrial size was severely dilated.  4. Right atrial size  was mildly dilated.  5. The mitral valve is grossly normal. Mild mitral valve regurgitation.  6. The aortic valve is tricuspid. Aortic valve regurgitation is not visualized. No aortic stenosis is present. Aortic valve mean gradient measures 4.0 mmHg.  7. The inferior vena cava is dilated in size with >50% respiratory variability, suggesting right atrial pressure of 8 mmHg. Comparison(s): No prior Echocardiogram. FINDINGS  Left Ventricle: Left ventricular ejection fraction, by estimation, is 50 to 55%. The left ventricle has low normal function. The left ventricle demonstrates regional wall motion abnormalities. Definity contrast agent was given IV to delineate the left ventricular endocardial  borders. The left ventricular internal cavity size was normal in size. There is mild left ventricular hypertrophy. Left ventricular diastolic parameters were normal.  LV Wall Scoring: The mid and distal anterior septum, apical anterior segment, and basal inferior segment are hypokinetic. The anterior wall, entire lateral wall, mid and distal inferior wall, basal anteroseptal segment, mid inferoseptal segment, basal inferoseptal segment, and apex are normal. Right Ventricle: The right ventricular size is normal. No increase in right ventricular wall thickness. Right ventricular systolic function is normal. Tricuspid regurgitation signal is inadequate for assessing PA pressure. Left [facility]: Left atrial size was severely dilated. Right [facility]: Right atrial size was mildly dilated. Pericardium: There is no evidence of pericardial effusion. Mitral Valve: The mitral valve is grossly normal. Mild mitral valve regurgitation. Tricuspid Valve: The tricuspid valve is grossly normal. Tricuspid valve regurgitation is trivial. Aortic Valve: The aortic valve is tricuspid. There is mild aortic valve annular calcification. Aortic valve regurgitation is not visualized. No aortic stenosis is present. Aortic valve mean gradient measures 4.0 mmHg. Aortic valve peak gradient measures 8.2 mmHg. Aortic valve area, by VTI measures 3.04 cm. Pulmonic Valve: The pulmonic valve was grossly normal. Pulmonic valve regurgitation is trivial. Aorta: The aortic root is normal in size and structure. Venous: The inferior vena cava is dilated in size with greater than 50% respiratory variability, suggesting right atrial pressure of 8 mmHg. IAS/Shunts: No atrial level shunt detected by color flow Doppler.  LEFT VENTRICLE PLAX 2D LVIDd:         5.60 cm  Diastology LVIDs:         4.10 cm  LV e' medial:    6.74 cm/s LV PW:         1.20 cm  LV E/e' medial:  13.4 LV IVS:        0.80 cm  LV e' lateral:   11.15 cm/s LVOT diam:     2.20 cm  LV E/e' lateral: 8.1  LV SV:         105 LV SV Index:   45 LVOT Area:     3.80 cm  RIGHT VENTRICLE          IVC RV Basal diam:  4.60 cm  IVC diam: 2.40 cm RV Mid diam:    3.90 cm LEFT ATRIUM              Index       RIGHT ATRIUM           Index LA diam:        5.00 cm  2.12 cm/m  RA Area:     26.30 cm LA Vol (A2C):   116.0 ml 49.27 ml/m RA Volume:   87.50 ml  37.17 ml/m LA Vol (A4C):   124.0 ml 52.67 ml/m LA Biplane Vol: 122.0 ml 51.82 ml/m  AORTIC VALVE AV Area (Vmax):  3.22 cm AV Area (Vmean):   3.04 cm AV Area (VTI):     3.04 cm AV Vmax:           143.00 cm/s AV Vmean:          97.000 cm/s AV VTI:            0.346 m AV Peak Grad:      8.2 mmHg AV Mean Grad:      4.0 mmHg LVOT Vmax:         121.00 cm/s LVOT Vmean:        77.700 cm/s LVOT VTI:          0.277 m LVOT/AV VTI ratio: 0.80  AORTA Ao Root diam: 3.30 cm Ao Asc diam:  3.60 cm MITRAL VALVE MV Area (PHT): 4.80 cm    SHUNTS MV Decel Time: 158 msec    Systemic VTI:  0.28 m MV E velocity: 90.40 cm/s  Systemic Diam: 2.20 cm MV Emylee Decelle velocity: 54.40 cm/s MV E/Romelo Sciandra ratio:  1.66 Rozann Lesches MD Electronically signed by Rozann Lesches MD Signature Date/Time: 02/02/2021/3:36:46 PM    Final     Microbiology: Recent Results (from the past 240 hour(s))  Resp Panel by RT-PCR (Flu Nieve Rojero&B, Covid) Nasopharyngeal Swab     Status: None   Collection Time: 02/01/21  7:42 AM   Specimen: Nasopharyngeal Swab; Nasopharyngeal(NP) swabs in vial transport medium  Result Value Ref Range Status   SARS Coronavirus 2 by RT PCR NEGATIVE NEGATIVE Final    Comment: (NOTE) SARS-CoV-2 target nucleic acids are NOT DETECTED.  The SARS-CoV-2 RNA is generally detectable in upper respiratory specimens during the acute phase of infection. The lowest concentration of SARS-CoV-2 viral copies this assay can detect is 138 copies/mL. Jaclyn Andy negative result does not preclude SARS-Cov-2 infection and should not be used as the sole basis for treatment or other patient management decisions. Jd Mccaster negative result may  occur with  improper specimen collection/handling, submission of specimen other than nasopharyngeal swab, presence of viral mutation(s) within the areas targeted by this assay, and inadequate number of viral copies(<138 copies/mL). Heru Montz negative result must be combined with clinical observations, patient history, and epidemiological information. The expected result is Negative.  Fact Sheet for Patients:  EntrepreneurPulse.com.au  Fact Sheet for Healthcare Providers:  IncredibleEmployment.be  This test is no t yet approved or cleared by the [place] FDA and  has been authorized for detection and/or diagnosis of SARS-CoV-2 by FDA under an Emergency Use Authorization (EUA). This EUA will remain  in effect (meaning this test can be used) for the duration of the COVID-19 declaration under Section 564(b)(1) of the Act, 21 U.S.C.section 360bbb-3(b)(1), unless the authorization is terminated  or revoked sooner.       Influenza Loralyn Rachel by PCR NEGATIVE NEGATIVE Final   Influenza B by PCR NEGATIVE NEGATIVE Final    Comment: (NOTE) The Xpert Xpress SARS-CoV-2/FLU/RSV plus assay is intended as an aid in the diagnosis of influenza from Nasopharyngeal swab specimens and should not be used as Trystyn Sitts sole basis for treatment. Nasal washings and aspirates are unacceptable for Xpert Xpress SARS-CoV-2/FLU/RSV testing.  Fact Sheet for Patients: EntrepreneurPulse.com.au  Fact Sheet for Healthcare Providers: IncredibleEmployment.be  This test is not yet approved or cleared by the [place] FDA and has been authorized for detection and/or diagnosis of SARS-CoV-2 by FDA under an Emergency Use Authorization (EUA). This EUA will remain in effect (meaning this test can be used) for the duration of the COVID-19 declaration  under Section 564(b)(1) of the Act, 21 U.S.C. section 360bbb-3(b)(1), unless the authorization is terminated  or revoked.  Performed at [facility], [place] [address], [place], [place] [address]      Labs: Basic Metabolic Panel: Recent Labs  Lab 02/03/21 0553 02/04/21 0315 02/05/21 0352 02/06/21 0339 02/07/21 0646 02/08/21 0440  NA 140 139 138 138 137 137  K 5.4* 4.4 4.5 4.3 3.8 3.6  CL 106 105 104 101 98 97*  CO2 $Re'27 24 24 25 29 27  'Lxw$ GLUCOSE 149* 161* 133* 141* 148* 172*  BUN 106* 100* 105* 111* 99* 100*  CREATININE 2.05* 1.98* 1.99* 2.18* 2.30* 2.53*  CALCIUM 8.2* 8.2* 8.1* 8.4* 8.0* 8.0*  MG 2.3  --  2.0 1.9  --  1.7  PHOS  --   --   --   --   --  4.7*   Liver Function Tests: Recent Labs  Lab 02/02/21 0236 02/03/21 0553 02/04/21 0315 02/05/21 0352 02/08/21 0440  AST 13* 14* 19 16 12*  ALT $Re'5 6 6 8 8  'gUr$ ALKPHOS 70 65 60 66 51  BILITOT 0.7 0.6 1.4* 1.4* 1.1  PROT 7.7 7.4 7.5 6.7 7.0  ALBUMIN 2.3* 2.3* 2.5* 2.3* 2.1*   No results for input(s): LIPASE, AMYLASE in the last 168 hours. No results for input(s): AMMONIA in the last 168 hours. CBC: Recent Labs  Lab 02/04/21 0315 02/05/21 0352 02/06/21 0339 02/07/21 0646 02/08/21 0440  WBC 11.0* 9.1 11.4* 12.3* 14.5*  NEUTROABS  --   --   --   --  10.8*  HGB 8.5* 7.9* 9.4* 8.4* 8.5*  HCT 26.5* 24.8* 29.1* 25.9* 26.0*  MCV 88.6 89.9 89.3 89.0 88.7  PLT 205 198 233 225 234   Cardiac Enzymes: Recent Labs  Lab 02/08/21 0440  CKTOTAL 39*   BNP: BNP (last 3 results) Recent Labs    02/01/21 0646  BNP 1,671.1*    ProBNP (last 3 results) No results for input(s): PROBNP in the last 8760 hours.  CBG: Recent Labs  Lab 02/07/21 1135 02/07/21 1550 02/07/21 2108 02/08/21 0636 02/08/21 1108  GLUCAP 194* 134* 158* 159* 182*       Signed:  Fayrene Helper MD.  [facility] 02/08/2021, 12:12 PM

## 2021-02-08 NOTE — TOC Benefit Eligibility Note (Signed)
Patient Advocate Encounter  Prior Authorization for Linezolid 600 mg has been approved.     Effective dates: 02/08/2021 through 05/10/2021      Roland Earl, CPhT Pharmacy Patient Advocate Specialist [place] Antimicrobial Stewardship Team Direct Number: [phone]  Fax: [phone]

## 2021-02-12 LAB — CULTURE, BLOOD (ROUTINE X 2)
Culture: NO GROWTH
Culture: NO GROWTH
Special Requests: ADEQUATE

## 2021-02-14 ENCOUNTER — Ambulatory Visit: Payer: Medicare Other | Admitting: Orthopedic Surgery

## 2021-02-26 ENCOUNTER — Other Ambulatory Visit (HOSPITAL_COMMUNITY): Payer: Self-pay

## 2021-02-26 MED ORDER — LINEZOLID 600 MG PO TABS
ORAL_TABLET | ORAL | 0 refills | Status: AC
  Filled 2021-02-26: qty 10, 5d supply, fill #0

## 2021-03-07 ENCOUNTER — Other Ambulatory Visit (HOSPITAL_COMMUNITY): Payer: Self-pay

## 2021-04-18 DEATH — deceased
